# Patient Record
Sex: Male | Born: 1945 | Race: White | Hispanic: No | Marital: Married | Smoking: Never smoker
Health system: Southern US, Community
[De-identification: ages and names within clinical notes are randomized; demographics above are authoritative.]

## PROBLEM LIST (undated history)

## (undated) DIAGNOSIS — I4891 Unspecified atrial fibrillation: Secondary | ICD-10-CM

## (undated) DIAGNOSIS — E785 Hyperlipidemia, unspecified: Secondary | ICD-10-CM

## (undated) DIAGNOSIS — Z7901 Long term (current) use of anticoagulants: Secondary | ICD-10-CM

## (undated) DIAGNOSIS — T8859XA Other complications of anesthesia, initial encounter: Secondary | ICD-10-CM

## (undated) DIAGNOSIS — T4145XA Adverse effect of unspecified anesthetic, initial encounter: Secondary | ICD-10-CM

## (undated) DIAGNOSIS — I272 Pulmonary hypertension, unspecified: Secondary | ICD-10-CM

## (undated) DIAGNOSIS — I340 Nonrheumatic mitral (valve) insufficiency: Secondary | ICD-10-CM

## (undated) DIAGNOSIS — K219 Gastro-esophageal reflux disease without esophagitis: Secondary | ICD-10-CM

## (undated) DIAGNOSIS — I10 Essential (primary) hypertension: Secondary | ICD-10-CM

## (undated) DIAGNOSIS — I4821 Permanent atrial fibrillation: Secondary | ICD-10-CM

## (undated) HISTORY — DX: Essential (primary) hypertension: I10

## (undated) HISTORY — DX: Permanent atrial fibrillation: I48.21

## (undated) HISTORY — PX: CARDIOVERSION: SHX1299

## (undated) HISTORY — DX: Pulmonary hypertension, unspecified: I27.20

## (undated) HISTORY — DX: Nonrheumatic mitral (valve) insufficiency: I34.0

## (undated) HISTORY — DX: Hyperlipidemia, unspecified: E78.5

## (undated) HISTORY — PX: OTHER SURGICAL HISTORY: SHX169

## (undated) HISTORY — DX: Long term (current) use of anticoagulants: Z79.01

---

## 1998-02-10 ENCOUNTER — Emergency Department (HOSPITAL_COMMUNITY): Admission: EM | Admit: 1998-02-10 | Discharge: 1998-02-10 | Payer: Self-pay | Admitting: Emergency Medicine

## 1998-02-10 ENCOUNTER — Encounter: Payer: Self-pay | Admitting: Emergency Medicine

## 1998-02-12 ENCOUNTER — Encounter: Admission: RE | Admit: 1998-02-12 | Discharge: 1998-05-13 | Payer: Self-pay | Admitting: *Deleted

## 2011-07-23 ENCOUNTER — Encounter: Payer: Self-pay | Admitting: *Deleted

## 2011-09-23 ENCOUNTER — Ambulatory Visit (INDEPENDENT_AMBULATORY_CARE_PROVIDER_SITE_OTHER): Payer: Medicare Other | Admitting: Cardiology

## 2011-09-23 ENCOUNTER — Encounter: Payer: Self-pay | Admitting: Cardiology

## 2011-09-23 VITALS — BP 170/80 | HR 62 | Wt 223.4 lb

## 2011-09-23 DIAGNOSIS — E785 Hyperlipidemia, unspecified: Secondary | ICD-10-CM

## 2011-09-23 DIAGNOSIS — Z7901 Long term (current) use of anticoagulants: Secondary | ICD-10-CM

## 2011-09-23 DIAGNOSIS — I10 Essential (primary) hypertension: Secondary | ICD-10-CM

## 2011-09-23 DIAGNOSIS — I4891 Unspecified atrial fibrillation: Secondary | ICD-10-CM

## 2011-09-23 DIAGNOSIS — I2789 Other specified pulmonary heart diseases: Secondary | ICD-10-CM

## 2011-09-23 DIAGNOSIS — I4821 Permanent atrial fibrillation: Secondary | ICD-10-CM

## 2011-09-23 DIAGNOSIS — I272 Pulmonary hypertension, unspecified: Secondary | ICD-10-CM

## 2011-09-23 DIAGNOSIS — I059 Rheumatic mitral valve disease, unspecified: Secondary | ICD-10-CM

## 2011-09-23 DIAGNOSIS — I340 Nonrheumatic mitral (valve) insufficiency: Secondary | ICD-10-CM

## 2011-09-23 NOTE — Patient Instructions (Signed)
We will update your echocardiogram  Continue your medication  I will see you again in one year.

## 2011-09-23 NOTE — Assessment & Plan Note (Signed)
He reports that his INRs have been stable and therapeutic. Continue with his Coumadin followup.

## 2011-09-23 NOTE — Assessment & Plan Note (Signed)
Blood pressure is elevated today but he reports good control at other times. We will continue on his current therapy and monitor.

## 2011-09-23 NOTE — Progress Notes (Signed)
   [name] Date of Birth: 07/28/1945 Medical Record #[id number]  History of Present Illness: [name] is seen today for followup. He was last seen in October of 2010. He has a history of permanent atrial fibrillation that has been managed with rate control and anticoagulation. He has a history of moderate mitral insufficiency and pulmonary hypertension. His last echocardiogram was many years ago. Since his last visit he reports that he has done very well. His blood pressure is sporadically high. On his physical last week it was 130/82. 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 He has lost 13 pounds since his last visit. He denies any significant shortness of breath, chest pain, palpitations, or dizziness.  Current Outpatient Prescriptions on File Prior to Visit  Medication Sig Dispense Refill  . digoxin (LANOXIN) 0.25 MG tablet Take 250 mcg by mouth daily.      Marland Kitchen lisinopril (PRINIVIL,ZESTRIL) 40 MG tablet Take 40 mg by mouth daily.      . metFORMIN (GLUCOPHAGE) 500 MG tablet Take 500 mg by mouth daily with breakfast.      . metoprolol succinate (TOPROL-XL) 50 MG 24 hr tablet Take 50 mg by mouth daily. Take with or immediately following a meal.      . simvastatin (ZOCOR) 10 MG tablet Take 10 mg by mouth at bedtime.      Marland Kitchen warfarin (COUMADIN) 2 MG tablet Take 2 mg by mouth as directed.        No Known Allergies  Past Medical History  Diagnosis Date  . Hypertension   . Permanent atrial fibrillation     chronic atrial fib  . Chronic anticoagulation   . Pulmonary hypertension   . Diabetes mellitus     type 2  . Hyperlipidemia     Past Surgical History  Procedure Date  . Spur on heel     History  Smoking status  . Never Smoker   Smokeless tobacco  . Not on file    History  Alcohol Use No    Family History  Problem Relation Age of Onset  . Hypertension Mother   . Alzheimer's disease Mother   . Heart disease Father    cabgx2, respiratory failure, pacemaker    Review of Systems: The review of systems is positive for surgical incision and drainage of an abscess on his left shin in may..  All other systems were reviewed and are negative.  Physical Exam: BP 170/80  Pulse 62  Wt 223 lb 6.4 oz (101.334 kg) He is a pleasant white male in no acute distress. HEENT exam is unremarkable. He is normocephalic, atraumatic. Pupils are equal round and reactive. Sclera are clear. Oropharynx is clear. Neck is supple without JVD, adenopathy, thyromegaly, or bruits. Lungs are clear. Cardiac exam reveals an irregular rate and rhythm with a grade 2-3/6 holosystolic murmur at the apex radiating across the precordium. Abdomen is soft and nontender without hepatosplenomegaly or masses. He has chronic stasis dermatitis with 1+ edema. Pedal pulses are palpable. He is alert and oriented x3. Cranial nerves II through XII are intact. LABORATORY DATA: ECG demonstrates atrial fibrillation with a ventricular response of 57 beats per minute. He has an occasional PVC. Otherwise normal.  Assessment / Plan:

## 2011-09-23 NOTE — Assessment & Plan Note (Signed)
His murmur is more prominent than noted on previous exams. We will update an echocardiogram.

## 2011-09-23 NOTE — Assessment & Plan Note (Signed)
Rate is well controlled on metoprolol and digoxin. Continue rate control and anticoagulation.

## 2011-09-30 ENCOUNTER — Ambulatory Visit (HOSPITAL_COMMUNITY): Payer: Medicare Other | Attending: Cardiology | Admitting: Radiology

## 2011-09-30 DIAGNOSIS — Z7901 Long term (current) use of anticoagulants: Secondary | ICD-10-CM

## 2011-09-30 DIAGNOSIS — I4891 Unspecified atrial fibrillation: Secondary | ICD-10-CM | POA: Insufficient documentation

## 2011-09-30 DIAGNOSIS — I340 Nonrheumatic mitral (valve) insufficiency: Secondary | ICD-10-CM

## 2011-09-30 DIAGNOSIS — E785 Hyperlipidemia, unspecified: Secondary | ICD-10-CM | POA: Insufficient documentation

## 2011-09-30 DIAGNOSIS — I270 Primary pulmonary hypertension: Secondary | ICD-10-CM | POA: Insufficient documentation

## 2011-09-30 DIAGNOSIS — I252 Old myocardial infarction: Secondary | ICD-10-CM | POA: Insufficient documentation

## 2011-09-30 DIAGNOSIS — I10 Essential (primary) hypertension: Secondary | ICD-10-CM | POA: Insufficient documentation

## 2011-09-30 NOTE — Progress Notes (Signed)
Echocardiogram performed.  

## 2011-11-24 ENCOUNTER — Encounter: Payer: Self-pay | Admitting: Cardiology

## 2011-11-26 ENCOUNTER — Encounter: Payer: Self-pay | Admitting: Cardiology

## 2012-04-16 DIAGNOSIS — E7800 Pure hypercholesterolemia, unspecified: Secondary | ICD-10-CM | POA: Diagnosis not present

## 2012-04-16 DIAGNOSIS — IMO0001 Reserved for inherently not codable concepts without codable children: Secondary | ICD-10-CM | POA: Diagnosis not present

## 2013-02-06 ENCOUNTER — Ambulatory Visit: Payer: Medicare Other | Admitting: Physician Assistant

## 2013-02-14 ENCOUNTER — Encounter: Payer: Self-pay | Admitting: Physician Assistant

## 2013-02-14 ENCOUNTER — Ambulatory Visit (INDEPENDENT_AMBULATORY_CARE_PROVIDER_SITE_OTHER): Payer: Medicare Other | Admitting: Physician Assistant

## 2013-02-14 VITALS — BP 166/80 | HR 56 | Ht 75.0 in | Wt 233.0 lb

## 2013-02-14 DIAGNOSIS — E785 Hyperlipidemia, unspecified: Secondary | ICD-10-CM

## 2013-02-14 DIAGNOSIS — I340 Nonrheumatic mitral (valve) insufficiency: Secondary | ICD-10-CM

## 2013-02-14 DIAGNOSIS — I4821 Permanent atrial fibrillation: Secondary | ICD-10-CM

## 2013-02-14 DIAGNOSIS — I10 Essential (primary) hypertension: Secondary | ICD-10-CM

## 2013-02-14 DIAGNOSIS — I4891 Unspecified atrial fibrillation: Secondary | ICD-10-CM

## 2013-02-14 DIAGNOSIS — I059 Rheumatic mitral valve disease, unspecified: Secondary | ICD-10-CM

## 2013-02-14 NOTE — Progress Notes (Signed)
[address] [place], [place]  [address] Phone: [phone] Fax:  [phone]  Date:  02/14/2013   ID:  Nevaan, Bunton [birth year], MRN [id number]  PCP:  Johny Blamer, MD  Cardiologist:  Dr. Peter Swaziland     History of Present Illness: [name] is a 67 y.o. male with a hx of permanent AFib, mod MR, pulmonary HTN, HTN, HL, T2DM.  Echo (09/2011):  EF 55%, mod MR, severe LAE, severe RAE, PASP 57-61, trivial eff.  Last seen by Dr. Peter Swaziland 09/2011.    He is doing well since last seen.  The patient denies chest pain, shortness of breath, syncope, orthopnea, PND or significant pedal edema.  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    Recent Labs: No results found for requested labs within last 365 days.  Wt Readings from Last 3 Encounters:  02/14/13 233 lb (105.688 kg)  09/23/11 223 lb 6.4 oz (101.334 kg)     Past Medical History  Diagnosis Date  . Hypertension   . Permanent atrial fibrillation     chronic atrial fib  . Chronic anticoagulation   . Pulmonary hypertension   . Diabetes mellitus     type 2  . Hyperlipidemia   . Mitral regurgitation     a. Echo (09/2011):  EF 55%, mod MR, severe LAE, severe RAE, PASP 57-61, trivial eff  . Pulmonary hypertension     Current Outpatient Prescriptions  Medication Sig Dispense Refill  . digoxin (LANOXIN) 0.25 MG tablet Take 250 mcg by mouth daily.      . Glucose Blood (FREESTYLE LITE TEST VI)       . lisinopril (PRINIVIL,ZESTRIL) 40 MG tablet Take 40 mg by mouth daily.      . metFORMIN (GLUCOPHAGE) 500 MG tablet Take 500 mg by mouth daily with breakfast.      . metoprolol (LOPRESSOR) 50 MG tablet Take 50 mg by mouth 2 (two) times daily. 1/2 tab      . omeprazole (PRILOSEC) 20 MG capsule       . simvastatin (ZOCOR) 10 MG tablet Take 10 mg by mouth at bedtime.      Marland Kitchen warfarin (COUMADIN) 2 MG tablet Take 2 mg by mouth as directed.       No current facility-administered  medications for this visit.    Allergies:   Review of patient's allergies indicates no known allergies.   Social History:  The patient  reports that he has never smoked. He does not have any smokeless tobacco history on file. He reports that he does not drink alcohol or use illicit drugs.   Family History:  The patient's family history includes Alzheimer's disease in his mother; Heart disease in his father; Hypertension in his mother.   ROS:  Please see the history of present illness.      All other systems reviewed and negative.   PHYSICAL EXAM: VS:  BP 166/80  Pulse 56  Ht 6\' 3"  (1.905 m)  Wt 233 lb (105.688 kg)  BMI 29.12 kg/m2 Well nourished, well developed, in no acute distress HEENT: normal Neck: no JVD Cardiac:  normal S1, S2; irregularly irregular rhythm; no murmur Lungs:  clear to auscultation bilaterally, no wheezing, rhonchi or rales Abd: soft, nontender, no hepatomegaly Ext: no edema Skin: warm and dry Neuro:  CNs 2-12 intact, no focal abnormalities noted  EKG:  AFib, HR 56, no change from prior tracing     ASSESSMENT AND  PLAN:  1. Atrial Fibrillation:  Rate controlled.  HR is actually somewhat slow but stable over the years.  Coumadin is managed by PCP.  I will have him get a digoxin level with his MD at the [facility] this week.   2. Mitral Regurgitation:  Stable by last echo.  He is asymptomatic.  3. Hypertension:  BP elevated. He notes his BP was better at his PCPs office a few months ago.  Will ask for a BP check with MD at [facility] later this week.  If similar to today, would start Norvasc 2.5 QD.  Check BMET.  4. Hyperlipidemia:  Continue statin.  5. Disposition:  F/u with Dr. Peter Swaziland in 6 mos.   Signed, Tereso Newcomer, PA-C  02/14/2013 3:52 PM

## 2013-02-14 NOTE — Patient Instructions (Signed)
Your physician wants you to follow-up in:  6 months with Dr. Swaziland. You will receive a reminder letter in the mail two months in advance. If you don't receive a letter, please call our office to schedule the follow-up appointment.  [facility] and [facility].  Have the [place] fax results to [place].  Fax number here is [phone]

## 2013-07-31 ENCOUNTER — Ambulatory Visit
Admission: RE | Admit: 2013-07-31 | Discharge: 2013-07-31 | Disposition: A | Discharge status: Home / Self Care | Payer: Commercial Managed Care - HMO | Source: Ambulatory Visit | Attending: Family Medicine | Admitting: Family Medicine

## 2013-07-31 ENCOUNTER — Other Ambulatory Visit: Payer: Self-pay | Admitting: Family Medicine

## 2013-07-31 DIAGNOSIS — M79604 Pain in right leg: Secondary | ICD-10-CM

## 2014-04-03 DIAGNOSIS — Z7901 Long term (current) use of anticoagulants: Secondary | ICD-10-CM | POA: Diagnosis not present

## 2014-04-03 DIAGNOSIS — I4891 Unspecified atrial fibrillation: Secondary | ICD-10-CM | POA: Diagnosis not present

## 2014-04-16 DIAGNOSIS — E119 Type 2 diabetes mellitus without complications: Secondary | ICD-10-CM | POA: Diagnosis not present

## 2014-04-16 DIAGNOSIS — I10 Essential (primary) hypertension: Secondary | ICD-10-CM | POA: Diagnosis not present

## 2014-04-16 DIAGNOSIS — I4891 Unspecified atrial fibrillation: Secondary | ICD-10-CM | POA: Diagnosis not present

## 2014-05-08 DIAGNOSIS — L723 Sebaceous cyst: Secondary | ICD-10-CM | POA: Diagnosis not present

## 2014-05-08 DIAGNOSIS — E782 Mixed hyperlipidemia: Secondary | ICD-10-CM | POA: Diagnosis not present

## 2014-05-08 DIAGNOSIS — M109 Gout, unspecified: Secondary | ICD-10-CM | POA: Diagnosis not present

## 2014-05-08 DIAGNOSIS — I10 Essential (primary) hypertension: Secondary | ICD-10-CM | POA: Diagnosis not present

## 2014-05-08 DIAGNOSIS — E119 Type 2 diabetes mellitus without complications: Secondary | ICD-10-CM | POA: Diagnosis not present

## 2014-05-08 DIAGNOSIS — I4891 Unspecified atrial fibrillation: Secondary | ICD-10-CM | POA: Diagnosis not present

## 2014-05-08 DIAGNOSIS — I340 Nonrheumatic mitral (valve) insufficiency: Secondary | ICD-10-CM | POA: Diagnosis not present

## 2014-05-18 DIAGNOSIS — Z7901 Long term (current) use of anticoagulants: Secondary | ICD-10-CM | POA: Diagnosis not present

## 2014-05-18 DIAGNOSIS — I4891 Unspecified atrial fibrillation: Secondary | ICD-10-CM | POA: Diagnosis not present

## 2014-05-25 DIAGNOSIS — Z7901 Long term (current) use of anticoagulants: Secondary | ICD-10-CM | POA: Diagnosis not present

## 2014-05-25 DIAGNOSIS — I4891 Unspecified atrial fibrillation: Secondary | ICD-10-CM | POA: Diagnosis not present

## 2014-06-22 DIAGNOSIS — Z7901 Long term (current) use of anticoagulants: Secondary | ICD-10-CM | POA: Diagnosis not present

## 2014-06-22 DIAGNOSIS — I4891 Unspecified atrial fibrillation: Secondary | ICD-10-CM | POA: Diagnosis not present

## 2014-07-02 DIAGNOSIS — I4891 Unspecified atrial fibrillation: Secondary | ICD-10-CM | POA: Diagnosis not present

## 2014-07-02 DIAGNOSIS — Z7901 Long term (current) use of anticoagulants: Secondary | ICD-10-CM | POA: Diagnosis not present

## 2014-07-18 DIAGNOSIS — Z7901 Long term (current) use of anticoagulants: Secondary | ICD-10-CM | POA: Diagnosis not present

## 2014-07-18 DIAGNOSIS — I4891 Unspecified atrial fibrillation: Secondary | ICD-10-CM | POA: Diagnosis not present

## 2014-08-20 DIAGNOSIS — I4891 Unspecified atrial fibrillation: Secondary | ICD-10-CM | POA: Diagnosis not present

## 2014-08-20 DIAGNOSIS — Z7901 Long term (current) use of anticoagulants: Secondary | ICD-10-CM | POA: Diagnosis not present

## 2014-09-20 DIAGNOSIS — I10 Essential (primary) hypertension: Secondary | ICD-10-CM | POA: Diagnosis not present

## 2014-09-20 DIAGNOSIS — L820 Inflamed seborrheic keratosis: Secondary | ICD-10-CM | POA: Diagnosis not present

## 2014-09-20 DIAGNOSIS — E782 Mixed hyperlipidemia: Secondary | ICD-10-CM | POA: Diagnosis not present

## 2014-09-20 DIAGNOSIS — E119 Type 2 diabetes mellitus without complications: Secondary | ICD-10-CM | POA: Diagnosis not present

## 2014-09-20 DIAGNOSIS — Z7901 Long term (current) use of anticoagulants: Secondary | ICD-10-CM | POA: Diagnosis not present

## 2014-09-20 DIAGNOSIS — R1013 Epigastric pain: Secondary | ICD-10-CM | POA: Diagnosis not present

## 2014-09-20 DIAGNOSIS — L821 Other seborrheic keratosis: Secondary | ICD-10-CM | POA: Diagnosis not present

## 2014-09-21 ENCOUNTER — Other Ambulatory Visit: Payer: Self-pay | Admitting: Family Medicine

## 2014-09-21 DIAGNOSIS — R1013 Epigastric pain: Secondary | ICD-10-CM

## 2014-10-25 ENCOUNTER — Ambulatory Visit
Admission: RE | Admit: 2014-10-25 | Discharge: 2014-10-25 | Disposition: A | Discharge status: Home / Self Care | Payer: Medicare Other | Source: Ambulatory Visit | Attending: Family Medicine | Admitting: Family Medicine

## 2014-10-25 DIAGNOSIS — K8020 Calculus of gallbladder without cholecystitis without obstruction: Secondary | ICD-10-CM | POA: Diagnosis not present

## 2014-10-25 DIAGNOSIS — R1013 Epigastric pain: Secondary | ICD-10-CM | POA: Diagnosis not present

## 2014-10-31 DIAGNOSIS — K219 Gastro-esophageal reflux disease without esophagitis: Secondary | ICD-10-CM | POA: Diagnosis not present

## 2014-10-31 DIAGNOSIS — M109 Gout, unspecified: Secondary | ICD-10-CM | POA: Diagnosis not present

## 2014-10-31 DIAGNOSIS — K8020 Calculus of gallbladder without cholecystitis without obstruction: Secondary | ICD-10-CM | POA: Diagnosis not present

## 2014-10-31 DIAGNOSIS — I10 Essential (primary) hypertension: Secondary | ICD-10-CM | POA: Diagnosis not present

## 2014-10-31 DIAGNOSIS — E782 Mixed hyperlipidemia: Secondary | ICD-10-CM | POA: Diagnosis not present

## 2014-10-31 DIAGNOSIS — E119 Type 2 diabetes mellitus without complications: Secondary | ICD-10-CM | POA: Diagnosis not present

## 2014-10-31 DIAGNOSIS — I4891 Unspecified atrial fibrillation: Secondary | ICD-10-CM | POA: Diagnosis not present

## 2014-11-08 DIAGNOSIS — I4891 Unspecified atrial fibrillation: Secondary | ICD-10-CM | POA: Diagnosis not present

## 2014-11-08 DIAGNOSIS — Z7901 Long term (current) use of anticoagulants: Secondary | ICD-10-CM | POA: Diagnosis not present

## 2014-11-16 ENCOUNTER — Ambulatory Visit: Payer: Self-pay | Admitting: General Surgery

## 2014-11-16 DIAGNOSIS — K8020 Calculus of gallbladder without cholecystitis without obstruction: Secondary | ICD-10-CM | POA: Diagnosis not present

## 2014-11-16 NOTE — H&P (Signed)
History of Present Illness Ralene Ok MD; 11/16/2014 3:08 PM) Patient words: gallbladder.  The patient is a 69 year old male who presents for evaluation of gall stones. The patient is a 69 year old male who is referred by Dr. Shirline Frees for evaluation of symptomatic cholelithiasis. Patient states that he is had epigastric/right upper quadrant pain over the last several months. He states it's been more severe with higher fatty foods. Patient underwent an ultrasound which revealed multiple gallstones. Patient has a history of A. fib and is on Coumadin. Dr. Kenton Kingfisher manages his Coumadin.   Other Problems Marjean Donna, CMA; 11/16/2014 2:39 PM) Atrial Fibrillation Back Pain Cholelithiasis Diabetes Mellitus High blood pressure Sleep Apnea  Past Surgical History Marjean Donna, CMA; 11/16/2014 2:39 PM) Foot Surgery Right. Oral Surgery  Diagnostic Studies History ([place]; 11/16/2014 2:39 PM) Colonoscopy 1-5 years ago  Allergies Davy Pique Bynum, CMA; 11/16/2014 2:41 PM) No Known Drug Allergies09/04/2014  Medication History Davy Pique Bynum, CMA; 11/16/2014 2:41 PM) Digoxin (250MCG Tablet, Oral) Active. Hydrochlorothiazide (25MG  Tablet, Oral) Active. Lisinopril (40MG  Tablet, Oral) Active. MetFORMIN HCl (1000MG  Tablet, Oral) Active. Warfarin Sodium (3MG  Tablet, Oral) Active. Simvastatin (20MG  Tablet, Oral) Active. Omeprazole (20MG  Capsule DR, Oral) Active. Metoprolol Tartrate (50MG  Tablet, Oral) Active. Colchicine (0.6MG  Tablet, Oral) Active. Medications Reconciled  Social History Marjean Donna, CMA; 11/16/2014 2:39 PM) Caffeine use Coffee, Tea. No alcohol use No drug use Tobacco use Never smoker.  Family History Marjean Donna, [place]; 11/16/2014 2:39 PM) Diabetes Mellitus Father. Hypertension Mother.  Review of Systems Ralene Ok MD; 11/16/2014 3:07 PM) General Present- Feeling well. Not Present- Fever. Skin Present- Dryness and Non-Healing Wounds. Not  Present- Change in Wart/Mole, Hives, Jaundice, New Lesions, Rash and Ulcer. HEENT Present- Seasonal Allergies and Wears glasses/contact lenses. Not Present- Earache, Hearing Loss, Hoarseness, Nose Bleed, Oral Ulcers, Ringing in the Ears, Sinus Pain, Sore Throat, Visual Disturbances and Yellow Eyes. Respiratory Not Present- Bloody sputum, Chronic Cough, Difficulty Breathing, Snoring and Wheezing. Cardiovascular Not Present- Chest Pain. Gastrointestinal Present- Abdominal Pain and Indigestion. Not Present- Bloating, Bloody Stool, Change in Bowel Habits, Chronic diarrhea, Constipation, Difficulty Swallowing, Excessive gas, Gets full quickly at meals, Hemorrhoids, Nausea, Rectal Pain and Vomiting. Male Genitourinary Present- Change in Urinary Stream and Impotence. Not Present- Blood in Urine, Frequency, Nocturia, Painful Urination, Urgency and Urine Leakage. Musculoskeletal Present- Back Pain, Joint Pain, Joint Stiffness and Muscle Pain. Not Present- Muscle Weakness and Swelling of Extremities. Neurological Not Present- Decreased Memory, Fainting, Headaches, Numbness, Seizures, Tingling, Tremor, Trouble walking and Weakness. Psychiatric Not Present- Anxiety, Bipolar, Change in Sleep Pattern, Depression, Fearful and Frequent crying. Endocrine Present- Cold Intolerance. Not Present- Excessive Hunger, Hair Changes, Heat Intolerance, Hot flashes and New Diabetes.   Vitals (Sonya Bynum CMA; 11/16/2014 2:40 PM) 11/16/2014 2:40 PM Weight: 220 lb Height: 75in Body Surface Area: 2.3 m Body Mass Index: 27.5 kg/m Temp.: 47F(Temporal)  Pulse: 110 (Regular)  BP: 128/78 (Sitting, Left Arm, Standard)    Physical Exam Ralene Ok MD; 11/16/2014 3:07 PM) General Mental Status-Alert. General Appearance-Consistent with stated age. Hydration-Well hydrated. Voice-Normal.  Head and Neck Head-normocephalic, atraumatic with no lesions or palpable masses.  Eye Eyeball -  Bilateral-Extraocular movements intact. Sclera/Conjunctiva - Bilateral-No scleral icterus.  Chest and Lung Exam Chest and lung exam reveals -quiet, even and easy respiratory effort with no use of accessory muscles. Inspection Chest Wall - Normal. Back - normal.  Cardiovascular Cardiovascular examination reveals -normal heart sounds, regular rate and rhythm with no murmurs.  Abdomen Inspection Normal Exam - No Hernias.  Palpation/Percussion Normal exam - Soft, Non Tender, No Rebound tenderness, No Rigidity (guarding) and No hepatosplenomegaly. Auscultation Normal exam - Bowel sounds normal.  Neurologic Neurologic evaluation reveals -alert and oriented x 3 with no impairment of recent or remote memory. Mental Status-Normal.  Musculoskeletal Normal Exam - Left-Upper Extremity Strength Normal and Lower Extremity Strength Normal. Normal Exam - Right-Upper Extremity Strength Normal, Lower Extremity Weakness.    Assessment & Plan Ralene Ok MD; 11/16/2014 3:09 PM) SYMPTOMATIC CHOLELITHIASIS (574.20  K80.20) Impression: 69 year old male sent by cholelithiasis  1. The patient would like to proceed to the operating for a laparoscopic cholecystectomy. 2. Risks and benefits were discussed with the patient to generally include, but not limited to: infection, bleeding, possible need for post op ERCP, damage to the bile ducts, bile leak, and possible need for further surgery. Alternatives were offered and described. All questions were answered and the patient voiced understanding of the procedure and wishes to proceed at this point with a laparoscopic cholecystectomy

## 2014-11-22 ENCOUNTER — Ambulatory Visit: Payer: Commercial Managed Care - HMO | Admitting: Cardiology

## 2014-11-22 DIAGNOSIS — Z7901 Long term (current) use of anticoagulants: Secondary | ICD-10-CM | POA: Diagnosis not present

## 2014-11-22 DIAGNOSIS — I4891 Unspecified atrial fibrillation: Secondary | ICD-10-CM | POA: Diagnosis not present

## 2014-12-12 DIAGNOSIS — I4891 Unspecified atrial fibrillation: Secondary | ICD-10-CM | POA: Diagnosis not present

## 2014-12-12 DIAGNOSIS — Z7901 Long term (current) use of anticoagulants: Secondary | ICD-10-CM | POA: Diagnosis not present

## 2014-12-14 ENCOUNTER — Ambulatory Visit (INDEPENDENT_AMBULATORY_CARE_PROVIDER_SITE_OTHER): Payer: Medicare Other | Admitting: Cardiology

## 2014-12-14 ENCOUNTER — Encounter: Payer: Self-pay | Admitting: Cardiology

## 2014-12-14 VITALS — BP 150/62 | HR 47 | Ht 75.0 in | Wt 220.2 lb

## 2014-12-14 DIAGNOSIS — I10 Essential (primary) hypertension: Secondary | ICD-10-CM

## 2014-12-14 DIAGNOSIS — I340 Nonrheumatic mitral (valve) insufficiency: Secondary | ICD-10-CM | POA: Diagnosis not present

## 2014-12-14 DIAGNOSIS — I272 Pulmonary hypertension, unspecified: Secondary | ICD-10-CM

## 2014-12-14 DIAGNOSIS — I482 Chronic atrial fibrillation: Secondary | ICD-10-CM

## 2014-12-14 DIAGNOSIS — I4821 Permanent atrial fibrillation: Secondary | ICD-10-CM

## 2014-12-14 DIAGNOSIS — Z7901 Long term (current) use of anticoagulants: Secondary | ICD-10-CM | POA: Diagnosis not present

## 2014-12-14 DIAGNOSIS — E785 Hyperlipidemia, unspecified: Secondary | ICD-10-CM | POA: Diagnosis not present

## 2014-12-14 DIAGNOSIS — I270 Primary pulmonary hypertension: Secondary | ICD-10-CM

## 2014-12-14 NOTE — Patient Instructions (Signed)
Stop taking digoxin  Continue your other therapy  We will update your Echocardiogram  I will see you in one year

## 2014-12-14 NOTE — Progress Notes (Signed)
[place] [place], [place]  [address] Phone: [phone] Fax:  [phone]  Date:  12/14/2014   ID:  Drayk, Humbarger [birth year], MRN [id number]  PCP:  Shirline Frees, MD  Cardiologist:  Dr. Peter Martinique     History of Present Illness: [name] is a 69 y.o. male with a hx of permanent AFib, mod MR, pulmonary HTN, HTN, HL, T2DM.  Echo (09/2011):  EF 55%, mod MR, severe LAE, severe RAE, PASP 57-61, trivial eff.   On follow up today he is doing well.  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  He does note some fatigue. No dizziness or syncope. No SOB or chest pain. He has been diagnosed with gallstones and is planning on having surgery.  Recent Labs: No results found for requested labs within last 365 days.  Wt Readings from Last 3 Encounters:  12/14/14 99.882 kg (220 lb 3.2 oz)  02/14/13 105.688 kg (233 lb)  09/23/11 101.334 kg (223 lb 6.4 oz)     Past Medical History  Diagnosis Date  . Hypertension   . Permanent atrial fibrillation     chronic atrial fib  . Chronic anticoagulation   . Pulmonary hypertension   . Diabetes mellitus     type 2  . Hyperlipidemia   . Mitral regurgitation     a. Echo (09/2011):  EF 55%, mod MR, severe LAE, severe RAE, PASP 57-61, trivial eff  . Pulmonary hypertension     Current Outpatient Prescriptions  Medication Sig Dispense Refill  . colchicine 0.6 MG tablet Take 0.6 mg by mouth as needed.    . Glucose Blood (FREESTYLE LITE TEST VI)     . hydrochlorothiazide (HYDRODIURIL) 25 MG tablet Take 25 mg by mouth daily.    Marland Kitchen lisinopril (PRINIVIL,ZESTRIL) 40 MG tablet Take 40 mg by mouth daily.    . metFORMIN (GLUCOPHAGE) 500 MG tablet Take 500 mg by mouth daily with breakfast.    . metoprolol (LOPRESSOR) 50 MG tablet Take 50 mg by mouth 2 (two) times daily. 1/2 tab    . omeprazole (PRILOSEC) 20 MG capsule     . simvastatin (ZOCOR) 10 MG tablet Take 10 mg by mouth at bedtime.    Marland Kitchen  warfarin (COUMADIN) 2 MG tablet Take 2 mg by mouth as directed.     No current facility-administered medications for this visit.    Allergies:   Review of patient's allergies indicates no known allergies.   Social History:  The patient  reports that he has never smoked. He does not have any smokeless tobacco history on file. He reports that he does not drink alcohol or use illicit drugs.   Family History:  The patient's family history includes Alzheimer's disease in his mother; Heart disease in his father; Hypertension in his mother.   ROS:  Please see the history of present illness.      All other systems reviewed and negative.   PHYSICAL EXAM: VS:  BP 150/62 mmHg  Pulse 47  Ht 6\' 3"  (1.905 m)  Wt 99.882 kg (220 lb 3.2 oz)  BMI 27.52 kg/m2 Well nourished, well developed, in no acute distress HEENT: normal Neck: no JVD Cardiac:  normal S1, S2; irregularly irregular rhythm; no murmur Lungs:  clear to auscultation bilaterally, no wheezing, rhonchi or rales Abd: soft, nontender, no hepatomegaly Ext: no edema Skin: warm and dry Neuro:  CNs 2-12 intact, no focal abnormalities noted  EKG:  AFib,  HR 47. No acute ST changes. I have personally reviewed and interpreted this study.   ASSESSMENT AND PLAN:  1. Atrial Fibrillation:  Rate is too slow.  This may be contributing to his fatigue. Recommend stopping digoxin. Continue metoprolol.   Coumadin is managed by PCP. We discussed NOAC therapy but he has done well on coumadin. I see no reason to change.   2. Mitral Regurgitation: will update Echo.  He is asymptomatic.  3. Hypertension:  BP under fair control  4. Hyperlipidemia:  Continue statin.  5. Disposition:  F/u with me in one year 6. Gallstones. If Echo stable he will be cleared from a cardiac standpoint. Will hold coumadin 5 days before surgery. Does not need bridging.   Signed, Peter Martinique MD, [facility]    12/14/2014 5:22 PM

## 2014-12-24 ENCOUNTER — Other Ambulatory Visit: Payer: Self-pay

## 2014-12-24 ENCOUNTER — Ambulatory Visit (HOSPITAL_COMMUNITY): Payer: Medicare Other | Attending: Internal Medicine

## 2014-12-24 DIAGNOSIS — I4821 Permanent atrial fibrillation: Secondary | ICD-10-CM

## 2014-12-24 DIAGNOSIS — I340 Nonrheumatic mitral (valve) insufficiency: Secondary | ICD-10-CM | POA: Insufficient documentation

## 2014-12-24 DIAGNOSIS — I272 Other secondary pulmonary hypertension: Secondary | ICD-10-CM

## 2014-12-24 DIAGNOSIS — E119 Type 2 diabetes mellitus without complications: Secondary | ICD-10-CM | POA: Insufficient documentation

## 2014-12-24 DIAGNOSIS — I10 Essential (primary) hypertension: Secondary | ICD-10-CM | POA: Diagnosis not present

## 2014-12-24 DIAGNOSIS — Z8249 Family history of ischemic heart disease and other diseases of the circulatory system: Secondary | ICD-10-CM | POA: Insufficient documentation

## 2014-12-24 DIAGNOSIS — E785 Hyperlipidemia, unspecified: Secondary | ICD-10-CM | POA: Insufficient documentation

## 2014-12-24 DIAGNOSIS — Z7901 Long term (current) use of anticoagulants: Secondary | ICD-10-CM

## 2014-12-24 DIAGNOSIS — I482 Chronic atrial fibrillation: Secondary | ICD-10-CM

## 2014-12-24 DIAGNOSIS — I517 Cardiomegaly: Secondary | ICD-10-CM | POA: Insufficient documentation

## 2015-01-01 ENCOUNTER — Telehealth: Payer: Self-pay

## 2015-01-01 NOTE — Telephone Encounter (Signed)
Spoke to patient Dr.Jordan advised ok for upcoming gallbladder surgery.Advised to hold coumadin 5 days prior to surgery.Note faxed to [facility] Surgery Dr Rosendo Gros fax # [phone].Marland Kitchen

## 2015-01-17 DIAGNOSIS — Z7901 Long term (current) use of anticoagulants: Secondary | ICD-10-CM | POA: Diagnosis not present

## 2015-01-17 DIAGNOSIS — I4891 Unspecified atrial fibrillation: Secondary | ICD-10-CM | POA: Diagnosis not present

## 2015-01-17 DIAGNOSIS — Z23 Encounter for immunization: Secondary | ICD-10-CM | POA: Diagnosis not present

## 2015-01-30 DIAGNOSIS — Z7901 Long term (current) use of anticoagulants: Secondary | ICD-10-CM | POA: Diagnosis not present

## 2015-01-30 DIAGNOSIS — I4891 Unspecified atrial fibrillation: Secondary | ICD-10-CM | POA: Diagnosis not present

## 2015-02-20 ENCOUNTER — Ambulatory Visit: Payer: Self-pay | Admitting: General Surgery

## 2015-02-21 NOTE — Patient Instructions (Signed)
RANDOLL BIR  02/21/2015   Your procedure is scheduled on: 03/01/15    Report to [facility] Main  Entrance take [place]  elevators to 3rd floor to  [place] at    [place] AM.  Call this number if you have problems the morning of surgery [phone]   Remember: ONLY 1 PERSON MAY GO WITH YOU TO SHORT STAY TO GET  READY MORNING OF [place].  Do not eat food or drink liquids :After Midnight.             Eat a good healthy snack prior to bedtime.       Take these medicines the morning of surgery with A SIP OF WATER:   Metoprolol ( Lopressor), Omeprazole ( Prilosec) DO NOT TAKE ANY DIABETIC MEDICATIONS DAY OF YOUR SURGERY                               You may not have any metal on your body including hair pins and              piercings  Do not wear jewelry,  lotions, powders or perfumes, deodorant                         Men may shave face and neck.   Do not bring valuables to the hospital. [place].  Contacts, dentures or bridgework may not be worn into surgery.      Patients discharged the day of surgery will not be allowed to drive home.  Name and phone number of your driver:  Special Instructions:  Coughing and deep breathing exercises, leg exercises               Please read over the following fact sheets you were given: _____________________________________________________________________             [facility] - Preparing for Surgery Before surgery, you can play an important role.  Because skin is not sterile, your skin needs to be as free of germs as possible.  You can reduce the number of germs on your skin by washing with CHG (chlorahexidine gluconate) soap before surgery.  CHG is an antiseptic cleaner which kills germs and bonds with the skin to continue killing germs even after washing. Please DO NOT use if you have an allergy to CHG or antibacterial soaps.  If your skin becomes  reddened/irritated stop using the CHG and inform your nurse when you arrive at Short Stay. Do not shave (including legs and underarms) for at least 48 hours prior to the first CHG shower.  You may shave your face/neck. Please follow these instructions carefully:  1.  Shower with CHG Soap the night before surgery and the  morning of Surgery.  2.  If you choose to wash your hair, wash your hair first as usual with your  normal  shampoo.  3.  After you shampoo, rinse your hair and body thoroughly to remove the  shampoo.                           4.  Use CHG as you would any other liquid soap.  You  can apply chg directly  to the skin and wash                       Gently with a scrungie or clean washcloth.  5.  Apply the CHG Soap to your body ONLY FROM THE NECK DOWN.   Do not use on face/ open                           Wound or open sores. Avoid contact with eyes, ears mouth and genitals (private parts).                       Wash face,  Genitals (private parts) with your normal soap.             6.  Wash thoroughly, paying special attention to the area where your surgery  will be performed.  7.  Thoroughly rinse your body with warm water from the neck down.  8.  DO NOT shower/wash with your normal soap after using and rinsing off  the CHG Soap.                9.  Pat yourself dry with a clean towel.            10.  Wear clean pajamas.            11.  Place clean sheets on your bed the night of your first shower and do not  sleep with pets. Day of Surgery : Do not apply any lotions/deodorants the morning of surgery.  Please wear clean clothes to the hospital/surgery center.  FAILURE TO FOLLOW THESE INSTRUCTIONS MAY RESULT IN THE CANCELLATION OF YOUR SURGERY PATIENT SIGNATURE_________________________________  NURSE SIGNATURE__________________________________  ________________________________________________________________________

## 2015-02-22 ENCOUNTER — Encounter (HOSPITAL_COMMUNITY): Payer: Self-pay

## 2015-02-22 ENCOUNTER — Encounter (HOSPITAL_COMMUNITY)
Admission: RE | Admit: 2015-02-22 | Discharge: 2015-02-22 | Disposition: A | Discharge status: Home / Self Care | Payer: Medicare Other | Source: Ambulatory Visit | Attending: General Surgery | Admitting: General Surgery

## 2015-02-22 DIAGNOSIS — Z01812 Encounter for preprocedural laboratory examination: Secondary | ICD-10-CM | POA: Insufficient documentation

## 2015-02-22 DIAGNOSIS — K8080 Other cholelithiasis without obstruction: Secondary | ICD-10-CM | POA: Diagnosis not present

## 2015-02-22 HISTORY — DX: Other complications of anesthesia, initial encounter: T88.59XA

## 2015-02-22 HISTORY — DX: Gastro-esophageal reflux disease without esophagitis: K21.9

## 2015-02-22 HISTORY — DX: Adverse effect of unspecified anesthetic, initial encounter: T41.45XA

## 2015-02-22 LAB — CBC
HCT: 40.8 % (ref 39.0–52.0)
Hemoglobin: 13.7 g/dL (ref 13.0–17.0)
MCH: 32.5 pg (ref 26.0–34.0)
MCHC: 33.6 g/dL (ref 30.0–36.0)
MCV: 96.7 fL (ref 78.0–100.0)
PLATELETS: 166 10*3/uL (ref 150–400)
RBC: 4.22 MIL/uL (ref 4.22–5.81)
RDW: 12.9 % (ref 11.5–15.5)
WBC: 11.4 10*3/uL — ABNORMAL HIGH (ref 4.0–10.5)

## 2015-02-22 LAB — PROTIME-INR
INR: 3.17 — ABNORMAL HIGH (ref 0.00–1.49)
PROTHROMBIN TIME: 31.9 s — AB (ref 11.6–15.2)

## 2015-02-22 LAB — BASIC METABOLIC PANEL
Anion gap: 8 (ref 5–15)
BUN: 33 mg/dL — ABNORMAL HIGH (ref 6–20)
CALCIUM: 9.1 mg/dL (ref 8.9–10.3)
CO2: 26 mmol/L (ref 22–32)
CREATININE: 1.72 mg/dL — AB (ref 0.61–1.24)
Chloride: 107 mmol/L (ref 101–111)
GFR calc non Af Amer: 39 mL/min — ABNORMAL LOW (ref >60)
GFR, EST AFRICAN AMERICAN: 45 mL/min — AB (ref >60)
Glucose, Bld: 140 mg/dL — ABNORMAL HIGH (ref 65–99)
Potassium: 4.5 mmol/L (ref 3.5–5.1)
SODIUM: 141 mmol/L (ref 135–145)

## 2015-02-22 NOTE — Progress Notes (Signed)
PT/INR results of 02/22/2015 faxed via EPIC to Dr Rosendo Gros.

## 2015-02-22 NOTE — Progress Notes (Signed)
BMP done 02/22/15 routed via EPIC to Dr Rosendo Gros.

## 2015-02-22 NOTE — Progress Notes (Addendum)
EKG-12/14/14-EPIC  ECHO-12/24/14-EPIC  LOV with Dr P Martinique- EPIC - 12/14/14

## 2015-02-23 LAB — HEMOGLOBIN A1C
HEMOGLOBIN A1C: 6.9 % — AB (ref 4.8–5.6)
MEAN PLASMA GLUCOSE: 151 mg/dL

## 2015-02-25 NOTE — Progress Notes (Signed)
HgA1C done 02/22/2015 faxed via EPIC to DR Rosendo Gros.

## 2015-02-26 NOTE — Anesthesia Preprocedure Evaluation (Addendum)
Anesthesia Evaluation  Patient identified by MRN, date of birth, ID band Patient awake    Reviewed: Allergy & Precautions, NPO status , Patient's Chart, lab work & pertinent test results  Airway Mallampati: II   Neck ROM: Full    Dental  (+) Dental Advisory Given, Teeth Intact   Pulmonary neg pulmonary ROS,    breath sounds clear to auscultation       Cardiovascular Exercise Tolerance: Good hypertension, Pt. on medications + dysrhythmias Atrial Fibrillation + Valvular Problems/Murmurs MR  Rhythm:Regular  AF, ECHO 12/2014 EF 65%, moderate MR, Left and Right atrial enlargement, HX pulmonary HTN   Neuro/Psych    GI/Hepatic GERD  Medicated,  Endo/Other  diabetes, Poorly Controlled, Type 2, Oral Hypoglycemic Agents  Renal/GU Renal InsufficiencyRenal diseaseCreat 1.7     Musculoskeletal   Abdominal (+)  Abdomen: soft.    Peds  Hematology 13/40   Anesthesia Other Findings On coumadin for AF, coumadin has been held  Reproductive/Obstetrics                            Anesthesia Physical Anesthesia Plan  ASA: III  Anesthesia Plan: General   Post-op Pain Management:    Induction: Intravenous  Airway Management Planned: Oral ETT  Additional Equipment:   Intra-op Plan:   Post-operative Plan: Extubation in OR  Informed Consent: I have reviewed the patients History and Physical, chart, labs and discussed the procedure including the risks, benefits and alternatives for the proposed anesthesia with the patient or authorized representative who has indicated his/her understanding and acceptance.     Plan Discussed with:   Anesthesia Plan Comments: (Check am labs, On coumadin for AF)        Anesthesia Quick Evaluation

## 2015-02-27 DIAGNOSIS — I4891 Unspecified atrial fibrillation: Secondary | ICD-10-CM | POA: Diagnosis not present

## 2015-02-27 DIAGNOSIS — Z7901 Long term (current) use of anticoagulants: Secondary | ICD-10-CM | POA: Diagnosis not present

## 2015-03-01 ENCOUNTER — Encounter (HOSPITAL_COMMUNITY): Payer: Self-pay

## 2015-03-01 ENCOUNTER — Ambulatory Visit (HOSPITAL_COMMUNITY): Payer: Medicare Other | Admitting: Anesthesiology

## 2015-03-01 ENCOUNTER — Encounter (HOSPITAL_COMMUNITY)
Admission: RE | Disposition: A | Discharge status: Home / Self Care | Payer: Self-pay | Source: Ambulatory Visit | Attending: General Surgery

## 2015-03-01 ENCOUNTER — Ambulatory Visit (HOSPITAL_COMMUNITY)
Admission: RE | Admit: 2015-03-01 | Discharge: 2015-03-01 | Disposition: A | Discharge status: Home / Self Care | Payer: Medicare Other | Source: Ambulatory Visit | Attending: General Surgery | Admitting: General Surgery

## 2015-03-01 DIAGNOSIS — Z79899 Other long term (current) drug therapy: Secondary | ICD-10-CM | POA: Diagnosis not present

## 2015-03-01 DIAGNOSIS — E119 Type 2 diabetes mellitus without complications: Secondary | ICD-10-CM | POA: Diagnosis not present

## 2015-03-01 DIAGNOSIS — I10 Essential (primary) hypertension: Secondary | ICD-10-CM | POA: Insufficient documentation

## 2015-03-01 DIAGNOSIS — K219 Gastro-esophageal reflux disease without esophagitis: Secondary | ICD-10-CM | POA: Diagnosis not present

## 2015-03-01 DIAGNOSIS — I272 Other secondary pulmonary hypertension: Secondary | ICD-10-CM | POA: Insufficient documentation

## 2015-03-01 DIAGNOSIS — Z7901 Long term (current) use of anticoagulants: Secondary | ICD-10-CM | POA: Diagnosis not present

## 2015-03-01 DIAGNOSIS — I4891 Unspecified atrial fibrillation: Secondary | ICD-10-CM | POA: Diagnosis not present

## 2015-03-01 DIAGNOSIS — Z7984 Long term (current) use of oral hypoglycemic drugs: Secondary | ICD-10-CM | POA: Insufficient documentation

## 2015-03-01 DIAGNOSIS — E1165 Type 2 diabetes mellitus with hyperglycemia: Secondary | ICD-10-CM | POA: Diagnosis not present

## 2015-03-01 DIAGNOSIS — K8010 Calculus of gallbladder with chronic cholecystitis without obstruction: Secondary | ICD-10-CM | POA: Diagnosis not present

## 2015-03-01 DIAGNOSIS — I129 Hypertensive chronic kidney disease with stage 1 through stage 4 chronic kidney disease, or unspecified chronic kidney disease: Secondary | ICD-10-CM | POA: Diagnosis not present

## 2015-03-01 DIAGNOSIS — K8020 Calculus of gallbladder without cholecystitis without obstruction: Secondary | ICD-10-CM | POA: Diagnosis not present

## 2015-03-01 DIAGNOSIS — Z419 Encounter for procedure for purposes other than remedying health state, unspecified: Secondary | ICD-10-CM

## 2015-03-01 DIAGNOSIS — N189 Chronic kidney disease, unspecified: Secondary | ICD-10-CM | POA: Diagnosis not present

## 2015-03-01 HISTORY — PX: CHOLECYSTECTOMY: SHX55

## 2015-03-01 LAB — GLUCOSE, CAPILLARY
GLUCOSE-CAPILLARY: 131 mg/dL — AB (ref 65–99)
GLUCOSE-CAPILLARY: 129 mg/dL — AB (ref 65–99)

## 2015-03-01 LAB — PROTIME-INR
INR: 1.23 (ref 0.00–1.49)
PROTHROMBIN TIME: 15.7 s — AB (ref 11.6–15.2)

## 2015-03-01 SURGERY — LAPAROSCOPIC CHOLECYSTECTOMY
Anesthesia: General

## 2015-03-01 MED ORDER — OXYCODONE-ACETAMINOPHEN 5-325 MG PO TABS: 1.0000 | ORAL_TABLET | ORAL | Status: DC | PRN

## 2015-03-01 MED ORDER — ACETAMINOPHEN 650 MG RE SUPP
650.0000 mg | RECTAL | Status: DC | PRN
  Filled 2015-03-01: qty 1

## 2015-03-01 MED ORDER — OXYCODONE HCL 5 MG PO TABS
5.0000 mg | ORAL_TABLET | ORAL | Status: DC | PRN
  Administered 2015-03-01: 5 mg via ORAL
  Filled 2015-03-01: qty 1

## 2015-03-01 MED ORDER — MEPERIDINE HCL 50 MG/ML IJ SOLN: 6.2500 mg | INTRAMUSCULAR | Status: DC | PRN

## 2015-03-01 MED ORDER — LACTATED RINGERS IR SOLN
Status: DC | PRN
  Administered 2015-03-01: 1000 mL

## 2015-03-01 MED ORDER — FENTANYL CITRATE (PF) 100 MCG/2ML IJ SOLN
INTRAMUSCULAR | Status: DC
Start: 2015-03-01 — End: 2015-03-01
  Filled 2015-03-01: qty 2

## 2015-03-01 MED ORDER — ONDANSETRON HCL 4 MG/2ML IJ SOLN
INTRAMUSCULAR | Status: DC | PRN
  Administered 2015-03-01: 4 mg via INTRAVENOUS

## 2015-03-01 MED ORDER — LACTATED RINGERS IV SOLN
INTRAVENOUS | Status: DC | PRN
  Administered 2015-03-01: 07:00:00 via INTRAVENOUS

## 2015-03-01 MED ORDER — FENTANYL CITRATE (PF) 250 MCG/5ML IJ SOLN
INTRAMUSCULAR | Status: AC
  Filled 2015-03-01: qty 5

## 2015-03-01 MED ORDER — ONDANSETRON HCL 4 MG/2ML IJ SOLN
INTRAMUSCULAR | Status: AC
  Filled 2015-03-01: qty 2

## 2015-03-01 MED ORDER — ACETAMINOPHEN 325 MG PO TABS: 650.0000 mg | ORAL_TABLET | ORAL | Status: DC | PRN

## 2015-03-01 MED ORDER — FENTANYL CITRATE (PF) 100 MCG/2ML IJ SOLN
INTRAMUSCULAR | Status: DC | PRN
  Administered 2015-03-01: 50 ug via INTRAVENOUS
  Administered 2015-03-01: 100 ug via INTRAVENOUS
  Administered 2015-03-01: 50 ug via INTRAVENOUS

## 2015-03-01 MED ORDER — ROCURONIUM BROMIDE 100 MG/10ML IV SOLN
INTRAVENOUS | Status: AC
  Filled 2015-03-01: qty 1

## 2015-03-01 MED ORDER — LIDOCAINE HCL (CARDIAC) 20 MG/ML IV SOLN
INTRAVENOUS | Status: DC | PRN
  Administered 2015-03-01: 100 mg via INTRAVENOUS

## 2015-03-01 MED ORDER — LIDOCAINE HCL (CARDIAC) 20 MG/ML IV SOLN
INTRAVENOUS | Status: AC
  Filled 2015-03-01: qty 5

## 2015-03-01 MED ORDER — SUGAMMADEX SODIUM 200 MG/2ML IV SOLN
INTRAVENOUS | Status: DC | PRN
  Administered 2015-03-01: 200 mg via INTRAVENOUS

## 2015-03-01 MED ORDER — EPHEDRINE SULFATE 50 MG/ML IJ SOLN
INTRAMUSCULAR | Status: AC
  Filled 2015-03-01: qty 1

## 2015-03-01 MED ORDER — MIDAZOLAM HCL 5 MG/5ML IJ SOLN
INTRAMUSCULAR | Status: DC | PRN
  Administered 2015-03-01: 2 mg via INTRAVENOUS

## 2015-03-01 MED ORDER — NALOXONE HCL 0.4 MG/ML IJ SOLN
INTRAMUSCULAR | Status: DC | PRN
  Administered 2015-03-01: .08 ug via INTRAVENOUS

## 2015-03-01 MED ORDER — FENTANYL CITRATE (PF) 100 MCG/2ML IJ SOLN
25.0000 ug | INTRAMUSCULAR | Status: DC | PRN
  Administered 2015-03-01: 50 ug via INTRAVENOUS
  Administered 2015-03-01 (×2): 25 ug via INTRAVENOUS

## 2015-03-01 MED ORDER — CEFAZOLIN SODIUM-DEXTROSE 2-3 GM-% IV SOLR
2.0000 g | INTRAVENOUS | Status: AC
  Administered 2015-03-01: 2 g via INTRAVENOUS

## 2015-03-01 MED ORDER — PROMETHAZINE HCL 25 MG/ML IJ SOLN: 6.2500 mg | INTRAMUSCULAR | Status: DC | PRN

## 2015-03-01 MED ORDER — ROCURONIUM BROMIDE 100 MG/10ML IV SOLN
INTRAVENOUS | Status: DC | PRN
  Administered 2015-03-01 (×2): 20 mg via INTRAVENOUS

## 2015-03-01 MED ORDER — 0.9 % SODIUM CHLORIDE (POUR BTL) OPTIME
TOPICAL | Status: DC | PRN
  Administered 2015-03-01: 1000 mL

## 2015-03-01 MED ORDER — ATROPINE SULFATE 0.4 MG/ML IJ SOLN
INTRAMUSCULAR | Status: AC
  Filled 2015-03-01: qty 2

## 2015-03-01 MED ORDER — MIDAZOLAM HCL 2 MG/2ML IJ SOLN
INTRAMUSCULAR | Status: AC
  Filled 2015-03-01: qty 2

## 2015-03-01 MED ORDER — SUCCINYLCHOLINE CHLORIDE 20 MG/ML IJ SOLN
INTRAMUSCULAR | Status: DC | PRN
  Administered 2015-03-01: 100 mg via INTRAVENOUS

## 2015-03-01 MED ORDER — SODIUM CHLORIDE 0.9 % IJ SOLN
INTRAMUSCULAR | Status: AC
  Filled 2015-03-01: qty 10

## 2015-03-01 MED ORDER — MORPHINE SULFATE (PF) 10 MG/ML IV SOLN: 2.0000 mg | INTRAVENOUS | Status: DC | PRN

## 2015-03-01 MED ORDER — SUGAMMADEX SODIUM 200 MG/2ML IV SOLN
INTRAVENOUS | Status: AC
  Filled 2015-03-01: qty 2

## 2015-03-01 MED ORDER — PROPOFOL 10 MG/ML IV BOLUS
INTRAVENOUS | Status: AC
  Filled 2015-03-01: qty 20

## 2015-03-01 MED ORDER — BUPIVACAINE-EPINEPHRINE 0.25% -1:200000 IJ SOLN
INTRAMUSCULAR | Status: AC
  Filled 2015-03-01: qty 1

## 2015-03-01 MED ORDER — CEFAZOLIN SODIUM-DEXTROSE 2-3 GM-% IV SOLR
INTRAVENOUS | Status: AC
  Filled 2015-03-01: qty 50

## 2015-03-01 MED ORDER — PROPOFOL 10 MG/ML IV BOLUS
INTRAVENOUS | Status: DC | PRN
  Administered 2015-03-01: 200 mg via INTRAVENOUS

## 2015-03-01 MED ORDER — CHLORHEXIDINE GLUCONATE 4 % EX LIQD: 1.0000 "application " | Freq: Once | CUTANEOUS | Status: DC

## 2015-03-01 MED ORDER — BUPIVACAINE-EPINEPHRINE (PF) 0.25% -1:200000 IJ SOLN
INTRAMUSCULAR | Status: DC | PRN
  Administered 2015-03-01: 7 mL via PERINEURAL

## 2015-03-01 SURGICAL SUPPLY — 38 items
APL SKNCLS STERI-STRIP NONHPOA (GAUZE/BANDAGES/DRESSINGS) ×1
APPLIER CLIP 5 13 M/L LIGAMAX5 (MISCELLANEOUS)
BENZOIN TINCTURE PRP APPL 2/3 (GAUZE/BANDAGES/DRESSINGS) ×3 IMPLANT
CABLE HIGH FREQUENCY MONO STRZ (ELECTRODE) ×3 IMPLANT
CHLORAPREP W/TINT 26ML (MISCELLANEOUS) ×3 IMPLANT
CLIP APPLIE 5 13 M/L LIGAMAX5 (MISCELLANEOUS) IMPLANT
CLIP LIGATING HEMO O LOK GREEN (MISCELLANEOUS) ×6 IMPLANT
CLOSURE WOUND 1/2 X4 (GAUZE/BANDAGES/DRESSINGS) ×1
COVER MAYO STAND STRL (DRAPES) IMPLANT
COVER SURGICAL LIGHT HANDLE (MISCELLANEOUS) ×3 IMPLANT
COVER TRANSDUCER ULTRASND (DRAPES) ×3 IMPLANT
DECANTER SPIKE VIAL GLASS SM (MISCELLANEOUS) ×3 IMPLANT
DEVICE TROCAR PUNCTURE CLOSURE (ENDOMECHANICALS) ×3 IMPLANT
DRAPE C-ARM 42X120 X-RAY (DRAPES) IMPLANT
DRAPE LAPAROSCOPIC ABDOMINAL (DRAPES) ×3 IMPLANT
DRAPE UTILITY XL STRL (DRAPES) ×3 IMPLANT
ELECT REM PT RETURN 9FT ADLT (ELECTROSURGICAL) ×3
ELECTRODE REM PT RTRN 9FT ADLT (ELECTROSURGICAL) ×1 IMPLANT
GAUZE SPONGE 2X2 8PLY STRL LF (GAUZE/BANDAGES/DRESSINGS) ×1 IMPLANT
GAUZE SPONGE 4X4 12PLY STRL (GAUZE/BANDAGES/DRESSINGS) IMPLANT
GLOVE BIO SURGEON STRL SZ7.5 (GLOVE) ×3 IMPLANT
GOWN STRL REUS W/TWL XL LVL3 (GOWN DISPOSABLE) ×12 IMPLANT
HEMOSTAT SURGICEL 4X8 (HEMOSTASIS) IMPLANT
KIT BASIN OR (CUSTOM PROCEDURE TRAY) ×3 IMPLANT
NEEDLE INSUFFLATION 14GA 120MM (NEEDLE) ×3 IMPLANT
SCISSORS LAP 5X35 DISP (ENDOMECHANICALS) ×3 IMPLANT
SET CHOLANGIOGRAPH MIX (MISCELLANEOUS) IMPLANT
SET IRRIG TUBING LAPAROSCOPIC (IRRIGATION / IRRIGATOR) ×3 IMPLANT
SPONGE GAUZE 2X2 STER 10/PKG (GAUZE/BANDAGES/DRESSINGS) ×2
STRIP CLOSURE SKIN 1/2X4 (GAUZE/BANDAGES/DRESSINGS) ×2 IMPLANT
SUT MNCRL AB 4-0 PS2 18 (SUTURE) ×3 IMPLANT
TAPE CLOTH SURG 4X10 WHT LF (GAUZE/BANDAGES/DRESSINGS) ×3 IMPLANT
TOWEL OR 17X26 10 PK STRL BLUE (TOWEL DISPOSABLE) ×3 IMPLANT
TOWEL OR NON WOVEN STRL DISP B (DISPOSABLE) ×3 IMPLANT
TRAY LAPAROSCOPIC (CUSTOM PROCEDURE TRAY) ×3 IMPLANT
TROCAR BLADELESS OPT 5 75 (ENDOMECHANICALS) ×3 IMPLANT
TROCAR SLEEVE XCEL 5X75 (ENDOMECHANICALS) ×3 IMPLANT
TROCAR XCEL NON-BLD 11X100MML (ENDOMECHANICALS) ×3 IMPLANT

## 2015-03-01 NOTE — Anesthesia Procedure Notes (Signed)
Procedure Name: Intubation Date/Time: 03/01/2015 7:32 AM Performed by: Lind Covert Pre-anesthesia Checklist: Patient identified, Emergency Drugs available, Suction available and Patient being monitored Patient Re-evaluated:Patient Re-evaluated prior to inductionOxygen Delivery Method: Circle system utilized Preoxygenation: Pre-oxygenation with 100% oxygen Intubation Type: IV induction Ventilation: Mask ventilation without difficulty Laryngoscope Size: 2 and Miller Grade View: Grade I Tube type: Oral Tube size: 7.5 mm Number of attempts: 1 Airway Equipment and Method: Stylet Placement Confirmation: ETT inserted through vocal cords under direct vision and positive ETCO2 Secured at: 23 cm Tube secured with: Tape Dental Injury: Teeth and Oropharynx as per pre-operative assessment

## 2015-03-01 NOTE — Discharge Instructions (Signed)
[facility] ______[facility] °LAPAROSCOPIC SURGERY: POST OP INSTRUCTIONS °Always review your discharge instruction sheet given to you by the facility where your surgery was performed. °IF YOU HAVE DISABILITY OR FAMILY LEAVE FORMS, YOU MUST BRING THEM TO THE OFFICE FOR PROCESSING.   °DO NOT GIVE THEM TO YOUR DOCTOR. ° °1. A prescription for pain medication may be given to you upon discharge.  Take your pain medication as prescribed, if needed.  If narcotic pain medicine is not needed, then you may take acetaminophen (Tylenol) or ibuprofen (Advil) as needed. °2. Take your usually prescribed medications unless otherwise directed. °3. If you need a refill on your pain medication, please contact your pharmacy.  They will contact our office to request authorization. Prescriptions will not be filled after 5pm or on week-ends. °4. You should follow a light diet the first few days after arrival home, such as soup and crackers, etc.  Be sure to include lots of fluids daily. °5. Most patients will experience some swelling and bruising in the area of the incisions.  Ice packs will help.  Swelling and bruising can take several days to resolve.  °6. It is common to experience some constipation if taking pain medication after surgery.  Increasing fluid intake and taking a stool softener (such as Colace) will usually help or prevent this problem from occurring.  A mild laxative (Milk of Magnesia or Miralax) should be taken according to package instructions if there are no bowel movements after 48 hours. °7. Unless discharge instructions indicate otherwise, you may remove your bandages 24-48 hours after surgery, and you may shower at that time.  You may have steri-strips (small skin tapes) in place directly over the incision.  These strips should be left on the skin for 7-10 days.  If your surgeon used skin glue on the incision, you may shower in 24 hours.  The glue will flake off over the next 2-3 weeks.  Any sutures or  staples will be removed at the office during your follow-up visit. °8. ACTIVITIES:  You may resume regular (light) daily activities beginning the next day--such as daily self-care, walking, climbing stairs--gradually increasing activities as tolerated.  You may have sexual intercourse when it is comfortable.  Refrain from any heavy lifting or straining until approved by your doctor. °a. You may drive when you are no longer taking prescription pain medication, you can comfortably wear a seatbelt, and you can safely maneuver your car and apply brakes. °b. RETURN TO WORK:  __________________________________________________________ °9. You should see your doctor in the office for a follow-up appointment approximately 2-3 weeks after your surgery.  Make sure that you call for this appointment within a day or two after you arrive home to insure a convenient appointment time. °10. OTHER INSTRUCTIONS: __________________________________________________________________________________________________________________________ __________________________________________________________________________________________________________________________ °WHEN TO CALL YOUR DOCTOR: °1. Fever over 101.0 °2. Inability to urinate °3. Continued bleeding from incision. °4. Increased pain, redness, or drainage from the incision. °5. Increasing abdominal pain ° °The clinic staff is available to answer your questions during regular business hours.  Please don’t hesitate to call and ask to speak to one of the nurses for clinical concerns.  If you have a medical emergency, go to the nearest emergency room or call 911.  A surgeon from [facility] is always on call at the hospital. °[address], [address], [place], [place]  [address] ? [address] [address], [place], [place]   [address] °[phone] ? [phone] ? FAX [phone] °Web site:   [email or web address] °

## 2015-03-01 NOTE — Op Note (Signed)
03/01/2015  8:15 AM  PATIENT:  [name]  69 y.o. male  PRE-OPERATIVE DIAGNOSIS:  GALLSTONES  POST-OPERATIVE DIAGNOSIS:  GALLSTONES  PROCEDURE:  Procedure(s): LAPAROSCOPIC CHOLECYSTECTOMY (N/A)  SURGEON:  Surgeon(s) and Role:    * Ralene Ok, MD - Primary  ANESTHESIA:   local and general  EBL:   5cc  BLOOD ADMINISTERED:none  DRAINS: none   LOCAL MEDICATIONS USED:  BUPIVICAINE   SPECIMEN:  Source of Specimen:  gallbladder  DISPOSITION OF SPECIMEN:  PATHOLOGY  COUNTS:  YES  TOURNIQUET:  * No tourniquets in log *  DICTATION: .Dragon Dictation  EBL: [id number]   Complications: none   Counts: reported as correct x 2   Findings: Chronic inflammation of the GB and stones  Indications for procedure: Pt is a 69 y/o M with RUQ pain and seen to have gallstones.   Details of the procedure: The patient was taken to the operating and placed in the supine position with bilateral SCDs in place. A time out was called and all facts were verified. A pneumoperitoneum was obtained via A Veress needle technique to a pressure of 64mm of mercury. A 21mm trochar was then placed in the right upper quadrant under visualization, and there were no injuries to any abdominal organs. A 11 mm port was then placed in the umbilical region after infiltrating with local anesthesia under direct visualization. A second epigastric port was placed under direct visualization.   The gallbladder was identified and retracted, the peritoneum was then sharply dissected from the gallbladder and this dissection was carried down to Calot's triangle. The cystic duct was identified and stripped away circumferentially and seen going into the gallbladder 360, the critical angle was obtained.    2 clips were placed proximally one distally and the cystic duct transected. The cystic artery was identified and 2 clips placed proximally and one distally and transected. We then proceeded to remove the gallbladder off the  hepatic fossa with Bovie cautery. A retrieval bag was then placed in the abdomen and gallbladder placed in the bag. The hepatic fossa was then reexamined and hemostasis was achieved with Bovie cautery and was excellent at this portion of the case. The subhepatic fossa and perihepatic fossa was then irrigated until the effluent was clear. The specimen bag and specimen were removed from the abdominal cavity.  The 11 mm trocar fascia was reapproximated with the Endo Close #1 Vicryl x1. The pneumoperitoneum was evacuated and all trochars removed under direct visulalization. The skin was then closed with 4-0 Monocryl and the skin dressed with Steri-Strips, gauze, and tape. The patient was awaken from general anesthesia and taken to the recovery room in stable condition.    PLAN OF CARE: Discharge to home after PACU  PATIENT DISPOSITION:  PACU - hemodynamically stable.   Delay start of Pharmacological VTE agent (>24hrs) due to surgical blood loss or risk of bleeding: not applicable

## 2015-03-01 NOTE — Anesthesia Postprocedure Evaluation (Signed)
Anesthesia Post Note  [name]  Procedure(s) Performed: Procedure(s) (LRB): LAPAROSCOPIC CHOLECYSTECTOMY (N/A)  Patient location during evaluation: PACU Anesthesia Type: General Level of consciousness: awake and alert Pain management: pain level controlled Vital Signs Assessment: post-procedure vital signs reviewed and stable Respiratory status: spontaneous breathing, nonlabored ventilation, respiratory function stable and patient connected to nasal cannula oxygen Cardiovascular status: blood pressure returned to baseline and stable Postop Assessment: no signs of nausea or vomiting Anesthetic complications: no    Last Vitals:  Filed Vitals:   03/01/15 0830 03/01/15 0845  BP: 175/100 163/111  Pulse: 83 80  Temp: 36.6 C   Resp: 22 23    Last Pain:  Filed Vitals:   03/01/15 0851  PainSc: 7                  Petula Rotolo

## 2015-03-01 NOTE — Transfer of Care (Signed)
Immediate Anesthesia Transfer of Care Note  [name]  Procedure(s) Performed: Procedure(s): LAPAROSCOPIC CHOLECYSTECTOMY (N/A)  Patient Location: PACU  Anesthesia Type:General  Level of Consciousness: sedated  Airway & Oxygen Therapy: Patient Spontanous Breathing and Patient connected to face mask oxygen  Post-op Assessment: Report given to RN and Post -op Vital signs reviewed and stable  Post vital signs: Reviewed and stable  Last Vitals:  Filed Vitals:   03/01/15 0540  BP: 133/99  Pulse: 75  Temp: 36.2 C  Resp: 16    Complications: No apparent anesthesia complications

## 2015-03-01 NOTE — H&P (Signed)
History of Present Illness [name]; 11/16/2014 3:08 PM) Patient words: gallbladder.  The patient is a 69 year old male who presents for evaluation of gall stones. The patient is a 69 year old male who is referred by Dr. Shirline Frees for evaluation of symptomatic cholelithiasis. Patient states that he is had epigastric/right upper quadrant pain over the last several months. He states it's been more severe with higher fatty foods. Patient underwent an ultrasound which revealed multiple gallstones. Patient has a history of A. fib and is on Coumadin. Dr. Kenton Kingfisher manages his Coumadin.   Other Problems Marjean Donna, CMA; 11/16/2014 2:39 PM) Atrial Fibrillation Back Pain Cholelithiasis Diabetes Mellitus High blood pressure Sleep Apnea  Past Surgical History Marjean Donna, CMA; 11/16/2014 2:39 PM) Foot Surgery Right. Oral Surgery  Diagnostic Studies History ([place]; 11/16/2014 2:39 PM) Colonoscopy 1-5 years ago  Allergies Davy Pique Bynum, CMA; 11/16/2014 2:41 PM) No Known Drug Allergies09/04/2014  Medication History Davy Pique Bynum, CMA; 11/16/2014 2:41 PM) Digoxin (250MCG Tablet, Oral) Active. Hydrochlorothiazide (25MG  Tablet, Oral) Active. Lisinopril (40MG  Tablet, Oral) Active. MetFORMIN HCl (1000MG  Tablet, Oral) Active. Warfarin Sodium (3MG  Tablet, Oral) Active. Simvastatin (20MG  Tablet, Oral) Active. Omeprazole (20MG  Capsule DR, Oral) Active. Metoprolol Tartrate (50MG  Tablet, Oral) Active. Colchicine (0.6MG  Tablet, Oral) Active. Medications Reconciled  Social History Marjean Donna, CMA; 11/16/2014 2:39 PM) Caffeine use Coffee, Tea. No alcohol use No drug use Tobacco use Never smoker.  Family History Marjean Donna, [place]; 11/16/2014 2:39 PM) Diabetes Mellitus Father. Hypertension Mother.  Review of Systems [name]; 11/16/2014 3:07 PM) General Present- Feeling well. Not Present- Fever. Skin Present- Dryness and Non-Healing Wounds. Not  Present- Change in Wart/Mole, Hives, Jaundice, New Lesions, Rash and Ulcer. HEENT Present- Seasonal Allergies and Wears glasses/contact lenses. Not Present- Earache, Hearing Loss, Hoarseness, Nose Bleed, Oral Ulcers, Ringing in the Ears, Sinus Pain, Sore Throat, Visual Disturbances and Yellow Eyes. Respiratory Not Present- Bloody sputum, Chronic Cough, Difficulty Breathing, Snoring and Wheezing. Cardiovascular Not Present- Chest Pain. Gastrointestinal Present- Abdominal Pain and Indigestion. Not Present- Bloating, Bloody Stool, Change in Bowel Habits, Chronic diarrhea, Constipation, Difficulty Swallowing, Excessive gas, Gets full quickly at meals, Hemorrhoids, Nausea, Rectal Pain and Vomiting. Male Genitourinary Present- Change in Urinary Stream and Impotence. Not Present- Blood in Urine, Frequency, Nocturia, Painful Urination, Urgency and Urine Leakage. Musculoskeletal Present- Back Pain, Joint Pain, Joint Stiffness and Muscle Pain. Not Present- Muscle Weakness and Swelling of Extremities. Neurological Not Present- Decreased Memory, Fainting, Headaches, Numbness, Seizures, Tingling, Tremor, Trouble walking and Weakness. Psychiatric Not Present- Anxiety, Bipolar, Change in Sleep Pattern, Depression, Fearful and Frequent crying. Endocrine Present- Cold Intolerance. Not Present- Excessive Hunger, Hair Changes, Heat Intolerance, Hot flashes and New Diabetes.   BP 133/99 mmHg  Pulse 75  Temp(Src) 97.2 F (36.2 C) (Oral)  Resp 16  Ht 6\' 3"  (1.905 m)  Wt 100.245 kg (221 lb)  BMI 27.62 kg/m2  SpO2 97%    Physical Exam [name]; 11/16/2014 3:07 PM) General Mental Status-Alert. General Appearance-Consistent with stated age. Hydration-Well hydrated. Voice-Normal.  Head and Neck Head-normocephalic, atraumatic with no lesions or palpable masses.  Eye Eyeball - Bilateral-Extraocular movements intact. Sclera/Conjunctiva - Bilateral-No scleral icterus.  Chest and Lung  Exam Chest and lung exam reveals -quiet, even and easy respiratory effort with no use of accessory muscles. Inspection Chest Wall - Normal. Back - normal.  Cardiovascular Cardiovascular examination reveals -normal heart sounds, regular rate and rhythm with no murmurs.  Abdomen Inspection Normal Exam - No Hernias. Palpation/Percussion Normal exam -  Soft, Non Tender, No Rebound tenderness, No Rigidity (guarding) and No hepatosplenomegaly. Auscultation Normal exam - Bowel sounds normal.  Neurologic Neurologic evaluation reveals -alert and oriented x 3 with no impairment of recent or remote memory. Mental Status-Normal.  Musculoskeletal Normal Exam - Left-Upper Extremity Strength Normal and Lower Extremity Strength Normal. Normal Exam - Right-Upper Extremity Strength Normal, Lower Extremity Weakness.    Assessment & Plan [name]; 11/16/2014 3:09 PM) SYMPTOMATIC CHOLELITHIASIS (574.20  K80.20) Impression: 69 year old male sent by cholelithiasis  1. The patient would like to proceed to the operating for a laparoscopic cholecystectomy. 2. Risks and benefits were discussed with the patient to generally include, but not limited to: infection, bleeding, possible need for post op ERCP, damage to the bile ducts, bile leak, and possible need for further surgery. Alternatives were offered and described. All questions were answered and the patient voiced understanding of the procedure and wishes to proceed at this point with a laparoscopic cholecystectomy

## 2015-03-13 DIAGNOSIS — I4891 Unspecified atrial fibrillation: Secondary | ICD-10-CM | POA: Diagnosis not present

## 2015-03-13 DIAGNOSIS — Z7901 Long term (current) use of anticoagulants: Secondary | ICD-10-CM | POA: Diagnosis not present

## 2015-03-19 ENCOUNTER — Ambulatory Visit: Payer: Self-pay | Admitting: Podiatry

## 2015-03-26 ENCOUNTER — Ambulatory Visit (INDEPENDENT_AMBULATORY_CARE_PROVIDER_SITE_OTHER): Payer: Medicare Other

## 2015-03-26 ENCOUNTER — Encounter: Payer: Self-pay | Admitting: Podiatry

## 2015-03-26 ENCOUNTER — Ambulatory Visit (INDEPENDENT_AMBULATORY_CARE_PROVIDER_SITE_OTHER): Payer: Medicare Other | Admitting: Podiatry

## 2015-03-26 VITALS — BP 119/70 | HR 70 | Resp 12

## 2015-03-26 DIAGNOSIS — M79674 Pain in right toe(s): Secondary | ICD-10-CM

## 2015-03-26 DIAGNOSIS — E11621 Type 2 diabetes mellitus with foot ulcer: Secondary | ICD-10-CM

## 2015-03-26 DIAGNOSIS — L89891 Pressure ulcer of other site, stage 1: Secondary | ICD-10-CM

## 2015-03-26 DIAGNOSIS — M79675 Pain in left toe(s): Secondary | ICD-10-CM | POA: Diagnosis not present

## 2015-03-26 DIAGNOSIS — E119 Type 2 diabetes mellitus without complications: Secondary | ICD-10-CM | POA: Diagnosis not present

## 2015-03-26 DIAGNOSIS — L97509 Non-pressure chronic ulcer of other part of unspecified foot with unspecified severity: Secondary | ICD-10-CM

## 2015-03-26 DIAGNOSIS — B351 Tinea unguium: Secondary | ICD-10-CM | POA: Diagnosis not present

## 2015-03-26 DIAGNOSIS — M2042 Other hammer toe(s) (acquired), left foot: Secondary | ICD-10-CM

## 2015-03-26 NOTE — Progress Notes (Signed)
   Subjective:    Patient ID: [name], male    DOB: [birth year], 70 y.o.   MRN: [id number]  HPI: He presents today with a chief complaint of thick yellow dystrophic onychomycotic nails he's also complaining of a painful hammertoe deformity third digit of the left foot. He states this been present for quite some time but then now the toe is becoming discolored and he's concerned. He states he has diabetes but is under good control. He denies any trauma to the toe or any discharge. He denies fever chills nausea vomiting muscle aches or pains.    Review of Systems  Skin: Positive for color change.       Objective:   Physical Exam: Vital signs are stable he is alert and oriented 3 pulses are strongly palpable. Neurologic sensorium is intact for Semmes-Weinstein monofilament. Deep tendon reflexes are intact bilaterally muscle strength +5 over 5 dorsiflexion flexors and inverters everters all digits of musculature is intact. Orthopedic evaluation does demonstrate rigid hammertoe deformity right foot as opposed to the left foot which doesn't Mr. a flexible hammertoe deformities left foot. Third toe of the left foot demonstrates a distal clavus with bleeding beneath the lesion which was debrided today does demonstrate a superficial ulceration with no signs of infection. His toenails are thick yellow dystrophic with mycotic and painful to palpation as well as debridement. 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        Assessment & Plan:  Assessment: Pain and limp secondary to onychomycosis and pre-ulcerative lesion third digit left foot.  Plan: Debrided all reactive hyperkeratoses debrided toenails 1 through 5 bilateral. Dispense cushions and pads and demonstrated the use to him for his third toe to help alleviate the pressure on the tip of the toe. He understands this is amenable to it will follow up with me in 1 month.

## 2015-04-11 DIAGNOSIS — Z7901 Long term (current) use of anticoagulants: Secondary | ICD-10-CM | POA: Diagnosis not present

## 2015-04-11 DIAGNOSIS — I4891 Unspecified atrial fibrillation: Secondary | ICD-10-CM | POA: Diagnosis not present

## 2015-05-07 DIAGNOSIS — Z7901 Long term (current) use of anticoagulants: Secondary | ICD-10-CM | POA: Diagnosis not present

## 2015-05-07 DIAGNOSIS — I4891 Unspecified atrial fibrillation: Secondary | ICD-10-CM | POA: Diagnosis not present

## 2015-05-20 DIAGNOSIS — Z7901 Long term (current) use of anticoagulants: Secondary | ICD-10-CM | POA: Diagnosis not present

## 2015-05-20 DIAGNOSIS — I4891 Unspecified atrial fibrillation: Secondary | ICD-10-CM | POA: Diagnosis not present

## 2015-06-20 DIAGNOSIS — I4891 Unspecified atrial fibrillation: Secondary | ICD-10-CM | POA: Diagnosis not present

## 2015-06-20 DIAGNOSIS — Z7901 Long term (current) use of anticoagulants: Secondary | ICD-10-CM | POA: Diagnosis not present

## 2015-07-02 ENCOUNTER — Encounter: Payer: Self-pay | Admitting: Podiatry

## 2015-07-02 ENCOUNTER — Ambulatory Visit (INDEPENDENT_AMBULATORY_CARE_PROVIDER_SITE_OTHER): Payer: Medicare Other | Admitting: Podiatry

## 2015-07-02 VITALS — BP 141/96 | HR 69 | Resp 12

## 2015-07-02 DIAGNOSIS — B351 Tinea unguium: Secondary | ICD-10-CM | POA: Diagnosis not present

## 2015-07-02 DIAGNOSIS — E119 Type 2 diabetes mellitus without complications: Secondary | ICD-10-CM | POA: Diagnosis not present

## 2015-07-02 DIAGNOSIS — M79676 Pain in unspecified toe(s): Secondary | ICD-10-CM

## 2015-07-02 DIAGNOSIS — Q828 Other specified congenital malformations of skin: Secondary | ICD-10-CM | POA: Diagnosis not present

## 2015-07-02 NOTE — Progress Notes (Signed)
He presents today for follow-up of his elongated painful toenails and a callus in the third digit of the left foot. He states that he has recently purchased new shoes that are longer and wider which really alleviate the symptoms regarding his third digit left foot. His wife is concerned that the toe is black and that it may fall off. He denies major changes with his diabetes denies fever chills nausea vomiting muscle aches and pains.  Objective: Pulses are strongly palpable vital signs are noted noted to be normal. Hammertoe deformities bilateral resulting in a distal clavus third digit left foot with bleeding beneath it. Was debrided today does not demonstrate any type of ulceration. Toenails are thick yellow dystrophic onychomycotic and painful on palpation. No open skin lesions or wounds.  Assessment: Diabetes mellitus with painless secondary to onychomycosis and porokeratosis.  Plan: Debridement of all reactive hyperkeratosis and debridement of toenails bilateral. Follow up with him in 3 months

## 2015-07-16 DIAGNOSIS — I4891 Unspecified atrial fibrillation: Secondary | ICD-10-CM | POA: Diagnosis not present

## 2015-07-16 DIAGNOSIS — Z7901 Long term (current) use of anticoagulants: Secondary | ICD-10-CM | POA: Diagnosis not present

## 2015-07-26 DIAGNOSIS — Z8679 Personal history of other diseases of the circulatory system: Secondary | ICD-10-CM | POA: Diagnosis not present

## 2015-07-26 DIAGNOSIS — Z7901 Long term (current) use of anticoagulants: Secondary | ICD-10-CM | POA: Diagnosis not present

## 2015-08-23 DIAGNOSIS — Z8679 Personal history of other diseases of the circulatory system: Secondary | ICD-10-CM | POA: Diagnosis not present

## 2015-08-23 DIAGNOSIS — Z7901 Long term (current) use of anticoagulants: Secondary | ICD-10-CM | POA: Diagnosis not present

## 2015-09-25 DIAGNOSIS — E119 Type 2 diabetes mellitus without complications: Secondary | ICD-10-CM | POA: Diagnosis not present

## 2015-09-25 DIAGNOSIS — M545 Low back pain: Secondary | ICD-10-CM | POA: Diagnosis not present

## 2015-09-25 DIAGNOSIS — I4891 Unspecified atrial fibrillation: Secondary | ICD-10-CM | POA: Diagnosis not present

## 2015-09-25 DIAGNOSIS — I10 Essential (primary) hypertension: Secondary | ICD-10-CM | POA: Diagnosis not present

## 2015-09-25 DIAGNOSIS — Z0000 Encounter for general adult medical examination without abnormal findings: Secondary | ICD-10-CM | POA: Diagnosis not present

## 2015-09-25 DIAGNOSIS — E782 Mixed hyperlipidemia: Secondary | ICD-10-CM | POA: Diagnosis not present

## 2015-09-25 DIAGNOSIS — Z7901 Long term (current) use of anticoagulants: Secondary | ICD-10-CM | POA: Diagnosis not present

## 2015-09-25 DIAGNOSIS — Z1211 Encounter for screening for malignant neoplasm of colon: Secondary | ICD-10-CM | POA: Diagnosis not present

## 2015-09-25 DIAGNOSIS — N183 Chronic kidney disease, stage 3 (moderate): Secondary | ICD-10-CM | POA: Diagnosis not present

## 2015-09-25 DIAGNOSIS — Z125 Encounter for screening for malignant neoplasm of prostate: Secondary | ICD-10-CM | POA: Diagnosis not present

## 2015-10-01 ENCOUNTER — Ambulatory Visit (INDEPENDENT_AMBULATORY_CARE_PROVIDER_SITE_OTHER): Payer: Medicare Other | Admitting: Podiatry

## 2015-10-01 ENCOUNTER — Encounter: Payer: Self-pay | Admitting: Podiatry

## 2015-10-01 DIAGNOSIS — Q828 Other specified congenital malformations of skin: Secondary | ICD-10-CM | POA: Diagnosis not present

## 2015-10-01 DIAGNOSIS — M79676 Pain in unspecified toe(s): Secondary | ICD-10-CM | POA: Diagnosis not present

## 2015-10-01 DIAGNOSIS — L97529 Non-pressure chronic ulcer of other part of left foot with unspecified severity: Secondary | ICD-10-CM

## 2015-10-01 DIAGNOSIS — E11621 Type 2 diabetes mellitus with foot ulcer: Secondary | ICD-10-CM

## 2015-10-01 DIAGNOSIS — B351 Tinea unguium: Secondary | ICD-10-CM

## 2015-10-01 NOTE — Progress Notes (Signed)
He presents today with chief complaint of painful elongated toenails as well as painful ulcer to the distal aspect of the third digit left foot. He relates trauma in the past in this foot may have resulted in the rigidity of the toes which may have then resulted in the ulceration he claims. He denies fever chills nausea vomiting muscle aches and pains.  Objective: Vital signs are stable alert and oriented 3 pulses are palpable. Toenails are thick yellow dystrophic onychomycotic and painful palpation third digit of the left foot doesn't stress some mild edema no erythema saline as drainage or odor distal clavus is noted with very thick callus. Once debrided does demonstrate a superficial ulceration without any infection.  Assessment: Pain in limb secondary to onychomycosis and ulcerative lesion distal third toe left without complications.  Plan: At this point I debrided all reactive hyperkeratotic tissue debrided all onychomycotic tissue and will follow up with him in 3 months. He will watch for signs and symptoms of infection notify me if there are any. We also discussed the possibility of a disarticulation distal aspect of the toe.

## 2015-10-21 DIAGNOSIS — M2042 Other hammer toe(s) (acquired), left foot: Secondary | ICD-10-CM | POA: Diagnosis not present

## 2015-10-21 DIAGNOSIS — E119 Type 2 diabetes mellitus without complications: Secondary | ICD-10-CM | POA: Diagnosis not present

## 2015-10-21 DIAGNOSIS — L97521 Non-pressure chronic ulcer of other part of left foot limited to breakdown of skin: Secondary | ICD-10-CM | POA: Diagnosis not present

## 2015-10-23 DIAGNOSIS — I4891 Unspecified atrial fibrillation: Secondary | ICD-10-CM | POA: Diagnosis not present

## 2015-10-23 DIAGNOSIS — Z7901 Long term (current) use of anticoagulants: Secondary | ICD-10-CM | POA: Diagnosis not present

## 2015-10-24 DIAGNOSIS — L97521 Non-pressure chronic ulcer of other part of left foot limited to breakdown of skin: Secondary | ICD-10-CM | POA: Diagnosis not present

## 2015-10-24 DIAGNOSIS — I87322 Chronic venous hypertension (idiopathic) with inflammation of left lower extremity: Secondary | ICD-10-CM | POA: Diagnosis not present

## 2015-10-24 DIAGNOSIS — E1142 Type 2 diabetes mellitus with diabetic polyneuropathy: Secondary | ICD-10-CM | POA: Diagnosis not present

## 2015-10-24 DIAGNOSIS — M2042 Other hammer toe(s) (acquired), left foot: Secondary | ICD-10-CM | POA: Diagnosis not present

## 2015-10-31 DIAGNOSIS — Z7901 Long term (current) use of anticoagulants: Secondary | ICD-10-CM | POA: Diagnosis not present

## 2015-10-31 DIAGNOSIS — I4891 Unspecified atrial fibrillation: Secondary | ICD-10-CM | POA: Diagnosis not present

## 2015-11-08 DIAGNOSIS — Z7901 Long term (current) use of anticoagulants: Secondary | ICD-10-CM | POA: Diagnosis not present

## 2015-11-08 DIAGNOSIS — I4891 Unspecified atrial fibrillation: Secondary | ICD-10-CM | POA: Diagnosis not present

## 2015-11-28 DIAGNOSIS — M2042 Other hammer toe(s) (acquired), left foot: Secondary | ICD-10-CM | POA: Diagnosis not present

## 2015-12-09 DIAGNOSIS — Z7901 Long term (current) use of anticoagulants: Secondary | ICD-10-CM | POA: Diagnosis not present

## 2015-12-09 DIAGNOSIS — I4891 Unspecified atrial fibrillation: Secondary | ICD-10-CM | POA: Diagnosis not present

## 2015-12-31 ENCOUNTER — Ambulatory Visit: Payer: Medicare Other | Admitting: Podiatry

## 2016-01-09 DIAGNOSIS — Z7901 Long term (current) use of anticoagulants: Secondary | ICD-10-CM | POA: Diagnosis not present

## 2016-01-09 DIAGNOSIS — I4891 Unspecified atrial fibrillation: Secondary | ICD-10-CM | POA: Diagnosis not present

## 2016-01-28 ENCOUNTER — Encounter (INDEPENDENT_AMBULATORY_CARE_PROVIDER_SITE_OTHER): Payer: Self-pay | Admitting: Orthopedic Surgery

## 2016-01-28 ENCOUNTER — Ambulatory Visit (INDEPENDENT_AMBULATORY_CARE_PROVIDER_SITE_OTHER): Payer: Medicare Other

## 2016-01-28 ENCOUNTER — Ambulatory Visit (INDEPENDENT_AMBULATORY_CARE_PROVIDER_SITE_OTHER): Payer: Medicare Other | Admitting: Orthopedic Surgery

## 2016-01-28 VITALS — Ht 75.0 in | Wt 228.0 lb

## 2016-01-28 DIAGNOSIS — M25531 Pain in right wrist: Secondary | ICD-10-CM

## 2016-01-28 DIAGNOSIS — M1A0310 Idiopathic chronic gout, right wrist, without tophus (tophi): Secondary | ICD-10-CM

## 2016-01-28 LAB — URIC ACID: URIC ACID, SERUM: 7.6 mg/dL (ref 4.0–8.0)

## 2016-01-28 MED ORDER — ALLOPURINOL 100 MG PO TABS: 100.0000 mg | ORAL_TABLET | Freq: Two times a day (BID) | ORAL | 3 refills | Status: DC

## 2016-01-28 MED ORDER — COLCHICINE 0.6 MG PO TABS: 0.6000 mg | ORAL_TABLET | Freq: Two times a day (BID) | ORAL | 3 refills | Status: DC

## 2016-01-28 NOTE — Addendum Note (Signed)
Addended by: Meridee Score on: 01/28/2016 02:50 PM   Modules accepted: Orders

## 2016-01-28 NOTE — Progress Notes (Addendum)
Office Visit Note   [name]           Date of Birth: 1946/02/04           MRN: [id number] Visit Date: 01/28/2016              Requested by: Shirline Frees, MD [address], [place] [address] PCP: Shirline Frees, MD   Assessment & Plan: Visit Diagnoses:  1. 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 Pain in right wrist     Plan: We will draws uric acid level today. Patient does have a prescription for colchicine but he has not been taking it. Recommend that he take the colchicine twice a day until symptoms resolve and he may discontinue the colchicine prescription also provided for allopurinol to take twice a day 100 mg. Patient is to use his Velcro splint which has a firm support on the wrist. Patient states he also has some Voltaren gel at home and recommend the use of Voltaren gel on the wrist during the acute episode. Possible repeat uric acid level at follow-up. We'll call him with the results drawn today.  Follow-Up Instructions: Return in about 2 weeks (around 02/11/2016).   Orders:  Orders Placed This Encounter  Procedures  . XR Wrist 2 Views Right  . Uric acid   Meds ordered this encounter  Medications  . allopurinol (ZYLOPRIM) 100 MG tablet    Sig: Take 1 tablet (100 mg total) by mouth 2 (two) times daily.    Dispense:  60 tablet    Refill:  3  . colchicine 0.6 MG tablet    Sig: Take 1 tablet (0.6 mg total) by mouth 2 (two) times daily. Take BID for acute gout attack, stop when acute pain resolves    Dispense:  60 tablet    Refill:  3      Procedures: No procedures performed   Clinical Data: No additional findings.   Subjective: Chief Complaint  Patient presents with  . Right Wrist - Pain    Pain x 2 weeks    Patient presents today with right wrist pain. He was moving approximately 2 weeks ago. He twisted wrist to side when trying to open a drawer and heard a pop. It initially was getting better  but now he is doing worse. He has swelling. He is wearing wrist brace. Soreness in between fingers, he also has pain dorsal wrist. He also had prior injury trying to move a piece of electronic equipment, after injury he had to do therapy for 6 weeks to rehab his right hand. He was told after therapy he only had 85% usage of right hand. Patient is right hand dominant.   Patient states the pop was, like noisy here with new track and knuckle. Patient states she was just trying to pull open a drawer.  Review of Systems Patient is currently driving a car for part-time work  Objective: Vital Signs: Ht 6\' 3"  (1.905 m)   Wt 228 lb (103.4 kg)   BMI 28.50 kg/m   Physical Exam Patient is alert oriented no adenopathy well-dressed normal affect normal respiratory effort.  Patient has a normal gait. Examination he has warmth and swelling of the right wrist there is no cellulitis. He is globally tender to palpation around the wrist. His hand is neurovascularly intact with good flexion and extension actively of the fingers. He is most tender to palpation over the TFCC first  dorsal extensor compartment is nontender no evidence of flexor tenosynovium right is no evidence of a fracture.  Ortho Exam  Specialty Comments:  No specialty comments available.  Imaging: Xr Wrist 2 Views Right  Result Date: 01/28/2016 Two-view radiographs of the right wrist shows some mild arthritic changes no evidence of fracture no evidence of a scaphoid fracture no evidence of a scapholunate disassociation. Patient does have some calcification of the TFCC.    PMFS History: Patient Active Problem List   Diagnosis Date Noted  . Mitral insufficiency 09/23/2011  . Hypertension   . Permanent atrial fibrillation ([place])   . Chronic anticoagulation   . Pulmonary hypertension   . Hyperlipidemia    Past Medical History:  Diagnosis Date  . Chronic anticoagulation   . Complication of anesthesia    ' hard time waking up" per  patient   . Diabetes mellitus    type 2  . GERD (gastroesophageal reflux disease)   . Hyperlipidemia   . Hypertension   . Mitral regurgitation    a. Echo (09/2011):  EF 55%, mod MR, severe LAE, severe RAE, PASP 57-61, trivial eff  . Permanent atrial fibrillation ([facility])    chronic atrial fib  . Pulmonary hypertension   . Pulmonary hypertension     Family History  Problem Relation Age of Onset  . Hypertension Mother   . Alzheimer's disease Mother   . Heart disease Father     cabgx2, respiratory failure, pacemaker    Past Surgical History:  Procedure Laterality Date  . CARDIOVERSION    . CHOLECYSTECTOMY N/A 03/01/2015   Procedure: LAPAROSCOPIC CHOLECYSTECTOMY;  Surgeon: Ralene Ok, MD;  Location: [facility];  Service: General;  Laterality: N/A;  . spur on heel     Social History   Occupational History  . Not on file.   Social History Main Topics  . Smoking status: Never Smoker  . Smokeless tobacco: Never Used  . Alcohol use Yes     Comment: margarita   . Drug use: No  . Sexual activity: Not on file

## 2016-01-28 NOTE — Addendum Note (Signed)
Addended by: Maxcine Ham on: 01/28/2016 02:56 PM   Modules accepted: Orders

## 2016-01-29 ENCOUNTER — Other Ambulatory Visit (INDEPENDENT_AMBULATORY_CARE_PROVIDER_SITE_OTHER): Payer: Self-pay

## 2016-02-11 ENCOUNTER — Ambulatory Visit (INDEPENDENT_AMBULATORY_CARE_PROVIDER_SITE_OTHER): Payer: Medicare Other | Admitting: Orthopedic Surgery

## 2016-02-17 ENCOUNTER — Ambulatory Visit (INDEPENDENT_AMBULATORY_CARE_PROVIDER_SITE_OTHER): Payer: Medicare Other | Admitting: Family

## 2016-02-17 ENCOUNTER — Encounter (INDEPENDENT_AMBULATORY_CARE_PROVIDER_SITE_OTHER): Payer: Self-pay | Admitting: Orthopedic Surgery

## 2016-02-17 DIAGNOSIS — M1A09X0 Idiopathic chronic gout, multiple sites, without tophus (tophi): Secondary | ICD-10-CM

## 2016-02-17 NOTE — Progress Notes (Signed)
Office Visit Note   [name]           Date of Birth: May 10, 1945           MRN: [id number] Visit Date: 02/17/2016              Requested by: Shirline Frees, MD [address] [place], [place] [address] PCP: Shirline Frees, MD   Assessment & Plan: Visit Diagnoses:  1. Idiopathic chronic gout of multiple sites without tophus     Plan: Continue with allopurinol daily. Colchicine for flares. We will follow-up with him in 2 months to check a uric acid at that time.  Follow-Up Instructions: Return in about 2 months (around 04/19/2016) for gout f/u, ck uric acid.   Orders:  No orders of the defined types were placed in this encounter.  No orders of the defined types were placed in this encounter.     Procedures: No procedures performed   Clinical Data: No additional findings.   Subjective: Chief Complaint  Patient presents with  . Right Wrist - Follow-up    Patient is a 70 year old gentleman seen in follow up for gout and right wrist pain. He did have uric acid drawn two weeks prior, and was 7.6. Allopurinol dose was increased at that time, taking colchicine as well. He is having to get the colchicine medication over from [place].  Is pleased with his improvement and pain reduction. No concerns today.      Review of Systems  Constitutional: Negative for chills and fever.     Objective: Vital Signs: There were no vitals taken for this visit.  Physical Exam  Constitutional: He is oriented to person, place, and time. He appears well-developed and well-nourished.  Pulmonary/Chest: Effort normal.  Neurological: He is alert and oriented to person, place, and time.  Psychiatric: He has a normal mood and affect.  Nursing note reviewed.   Right Hand Exam   Tenderness  The patient is experiencing tenderness in the dorsal area.  Range of Motion  The patient has normal right wrist ROM.   Muscle Strength  The patient has normal right  wrist strength.  Other  Erythema: absent Pulse: present    Does have a little tenderness dorsally over the MCP of the fourth finger.  Specialty Comments:  No specialty comments available.  Imaging: No results found.   PMFS History: Patient Active Problem List   Diagnosis Date Noted  . Mitral insufficiency 09/23/2011  . Hypertension   . Permanent atrial fibrillation ([place])   . Chronic anticoagulation   . Pulmonary hypertension   . Hyperlipidemia    Past Medical History:  Diagnosis Date  . Chronic anticoagulation   . Complication of anesthesia    ' hard time waking up" per patient   . Diabetes mellitus    type 2  . GERD (gastroesophageal reflux disease)   . Hyperlipidemia   . Hypertension   . Mitral regurgitation    a. Echo (09/2011):  EF 55%, mod MR, severe LAE, severe RAE, PASP 57-61, trivial eff  . Permanent atrial fibrillation (HCC)    chronic atrial fib  . Pulmonary hypertension   . Pulmonary hypertension     Family History  Problem Relation Age of Onset  . Hypertension Mother   . Alzheimer's disease Mother   . Heart disease Father     cabgx2, respiratory failure, pacemaker    Past Surgical History:  Procedure Laterality Date  . CARDIOVERSION    .  CHOLECYSTECTOMY N/A 03/01/2015   Procedure: LAPAROSCOPIC CHOLECYSTECTOMY;  Surgeon: Ralene Ok, MD;  Location: [facility];  Service: General;  Laterality: N/A;  . spur on heel     Social History   Occupational History  . Not on file.   Social History Main Topics  . Smoking status: Never Smoker  . Smokeless tobacco: Never Used  . Alcohol use Yes     Comment: margarita   . Drug use: No  . Sexual activity: Not on file

## 2016-02-18 DIAGNOSIS — Z7901 Long term (current) use of anticoagulants: Secondary | ICD-10-CM | POA: Diagnosis not present

## 2016-02-18 DIAGNOSIS — I4891 Unspecified atrial fibrillation: Secondary | ICD-10-CM | POA: Diagnosis not present

## 2016-02-25 ENCOUNTER — Other Ambulatory Visit: Payer: Self-pay | Admitting: Family Medicine

## 2016-02-25 ENCOUNTER — Ambulatory Visit
Admission: RE | Admit: 2016-02-25 | Discharge: 2016-02-25 | Disposition: A | Discharge status: Home / Self Care | Payer: Medicare Other | Source: Ambulatory Visit | Attending: Family Medicine | Admitting: Family Medicine

## 2016-02-25 DIAGNOSIS — M7989 Other specified soft tissue disorders: Secondary | ICD-10-CM | POA: Diagnosis not present

## 2016-02-25 DIAGNOSIS — M109 Gout, unspecified: Secondary | ICD-10-CM

## 2016-02-25 DIAGNOSIS — I10 Essential (primary) hypertension: Secondary | ICD-10-CM | POA: Diagnosis not present

## 2016-02-25 DIAGNOSIS — Z23 Encounter for immunization: Secondary | ICD-10-CM | POA: Diagnosis not present

## 2016-04-08 DIAGNOSIS — E782 Mixed hyperlipidemia: Secondary | ICD-10-CM | POA: Diagnosis not present

## 2016-04-08 DIAGNOSIS — Z7901 Long term (current) use of anticoagulants: Secondary | ICD-10-CM | POA: Diagnosis not present

## 2016-04-08 DIAGNOSIS — I10 Essential (primary) hypertension: Secondary | ICD-10-CM | POA: Diagnosis not present

## 2016-04-08 DIAGNOSIS — M109 Gout, unspecified: Secondary | ICD-10-CM | POA: Diagnosis not present

## 2016-04-08 DIAGNOSIS — R600 Localized edema: Secondary | ICD-10-CM | POA: Diagnosis not present

## 2016-04-08 DIAGNOSIS — E119 Type 2 diabetes mellitus without complications: Secondary | ICD-10-CM | POA: Diagnosis not present

## 2016-04-08 DIAGNOSIS — N183 Chronic kidney disease, stage 3 (moderate): Secondary | ICD-10-CM | POA: Diagnosis not present

## 2016-04-08 DIAGNOSIS — I4891 Unspecified atrial fibrillation: Secondary | ICD-10-CM | POA: Diagnosis not present

## 2016-04-22 DIAGNOSIS — Z7901 Long term (current) use of anticoagulants: Secondary | ICD-10-CM | POA: Diagnosis not present

## 2016-04-22 DIAGNOSIS — I4891 Unspecified atrial fibrillation: Secondary | ICD-10-CM | POA: Diagnosis not present

## 2016-05-19 DIAGNOSIS — I4891 Unspecified atrial fibrillation: Secondary | ICD-10-CM | POA: Diagnosis not present

## 2016-05-19 DIAGNOSIS — Z7901 Long term (current) use of anticoagulants: Secondary | ICD-10-CM | POA: Diagnosis not present

## 2016-06-08 ENCOUNTER — Other Ambulatory Visit (INDEPENDENT_AMBULATORY_CARE_PROVIDER_SITE_OTHER): Payer: Self-pay | Admitting: Orthopedic Surgery

## 2016-06-16 DIAGNOSIS — Z7901 Long term (current) use of anticoagulants: Secondary | ICD-10-CM | POA: Diagnosis not present

## 2016-06-16 DIAGNOSIS — I4891 Unspecified atrial fibrillation: Secondary | ICD-10-CM | POA: Diagnosis not present

## 2016-07-06 DIAGNOSIS — Z7901 Long term (current) use of anticoagulants: Secondary | ICD-10-CM | POA: Diagnosis not present

## 2016-07-06 DIAGNOSIS — Z8679 Personal history of other diseases of the circulatory system: Secondary | ICD-10-CM | POA: Diagnosis not present

## 2016-07-22 DIAGNOSIS — Z7901 Long term (current) use of anticoagulants: Secondary | ICD-10-CM | POA: Diagnosis not present

## 2016-08-27 ENCOUNTER — Encounter (HOSPITAL_COMMUNITY): Payer: Self-pay | Admitting: Emergency Medicine

## 2016-08-27 ENCOUNTER — Inpatient Hospital Stay (HOSPITAL_COMMUNITY)
Admission: EM | Admit: 2016-08-27 | Discharge: 2016-08-28 | DRG: 684 | Disposition: A | Discharge status: Home / Self Care | Payer: Medicare Other | Attending: Internal Medicine | Admitting: Internal Medicine

## 2016-08-27 ENCOUNTER — Emergency Department (HOSPITAL_COMMUNITY): Payer: Medicare Other

## 2016-08-27 DIAGNOSIS — E785 Hyperlipidemia, unspecified: Secondary | ICD-10-CM | POA: Diagnosis present

## 2016-08-27 DIAGNOSIS — M109 Gout, unspecified: Secondary | ICD-10-CM | POA: Diagnosis present

## 2016-08-27 DIAGNOSIS — R634 Abnormal weight loss: Secondary | ICD-10-CM | POA: Diagnosis present

## 2016-08-27 DIAGNOSIS — I482 Chronic atrial fibrillation: Secondary | ICD-10-CM | POA: Diagnosis not present

## 2016-08-27 DIAGNOSIS — N179 Acute kidney failure, unspecified: Principal | ICD-10-CM | POA: Diagnosis present

## 2016-08-27 DIAGNOSIS — M1A30X0 Chronic gout due to renal impairment, unspecified site, without tophus (tophi): Secondary | ICD-10-CM

## 2016-08-27 DIAGNOSIS — I340 Nonrheumatic mitral (valve) insufficiency: Secondary | ICD-10-CM | POA: Diagnosis present

## 2016-08-27 DIAGNOSIS — I272 Pulmonary hypertension, unspecified: Secondary | ICD-10-CM | POA: Diagnosis present

## 2016-08-27 DIAGNOSIS — Z79899 Other long term (current) drug therapy: Secondary | ICD-10-CM | POA: Diagnosis not present

## 2016-08-27 DIAGNOSIS — N183 Chronic kidney disease, stage 3 (moderate): Secondary | ICD-10-CM | POA: Diagnosis not present

## 2016-08-27 DIAGNOSIS — Z6827 Body mass index (BMI) 27.0-27.9, adult: Secondary | ICD-10-CM

## 2016-08-27 DIAGNOSIS — I480 Paroxysmal atrial fibrillation: Secondary | ICD-10-CM | POA: Diagnosis present

## 2016-08-27 DIAGNOSIS — I4891 Unspecified atrial fibrillation: Secondary | ICD-10-CM | POA: Diagnosis not present

## 2016-08-27 DIAGNOSIS — N189 Chronic kidney disease, unspecified: Secondary | ICD-10-CM | POA: Diagnosis not present

## 2016-08-27 DIAGNOSIS — E782 Mixed hyperlipidemia: Secondary | ICD-10-CM | POA: Diagnosis not present

## 2016-08-27 DIAGNOSIS — E119 Type 2 diabetes mellitus without complications: Secondary | ICD-10-CM

## 2016-08-27 DIAGNOSIS — E1122 Type 2 diabetes mellitus with diabetic chronic kidney disease: Secondary | ICD-10-CM | POA: Diagnosis not present

## 2016-08-27 DIAGNOSIS — I4821 Permanent atrial fibrillation: Secondary | ICD-10-CM | POA: Diagnosis present

## 2016-08-27 DIAGNOSIS — R5383 Other fatigue: Secondary | ICD-10-CM | POA: Diagnosis not present

## 2016-08-27 DIAGNOSIS — I10 Essential (primary) hypertension: Secondary | ICD-10-CM | POA: Diagnosis not present

## 2016-08-27 DIAGNOSIS — I129 Hypertensive chronic kidney disease with stage 1 through stage 4 chronic kidney disease, or unspecified chronic kidney disease: Secondary | ICD-10-CM | POA: Diagnosis present

## 2016-08-27 DIAGNOSIS — R600 Localized edema: Secondary | ICD-10-CM | POA: Diagnosis not present

## 2016-08-27 DIAGNOSIS — R1310 Dysphagia, unspecified: Secondary | ICD-10-CM | POA: Diagnosis not present

## 2016-08-27 DIAGNOSIS — Z8249 Family history of ischemic heart disease and other diseases of the circulatory system: Secondary | ICD-10-CM | POA: Diagnosis not present

## 2016-08-27 DIAGNOSIS — E875 Hyperkalemia: Secondary | ICD-10-CM

## 2016-08-27 DIAGNOSIS — Z7984 Long term (current) use of oral hypoglycemic drugs: Secondary | ICD-10-CM

## 2016-08-27 DIAGNOSIS — Z7901 Long term (current) use of anticoagulants: Secondary | ICD-10-CM

## 2016-08-27 DIAGNOSIS — K219 Gastro-esophageal reflux disease without esophagitis: Secondary | ICD-10-CM | POA: Diagnosis present

## 2016-08-27 HISTORY — DX: Unspecified atrial fibrillation: I48.91

## 2016-08-27 LAB — COMPREHENSIVE METABOLIC PANEL
ALBUMIN: 4.2 g/dL (ref 3.5–5.0)
ALK PHOS: 49 U/L (ref 38–126)
ALT: 14 U/L — ABNORMAL LOW (ref 17–63)
AST: 16 U/L (ref 15–41)
Anion gap: 7 (ref 5–15)
BILIRUBIN TOTAL: 0.6 mg/dL (ref 0.3–1.2)
BUN: 80 mg/dL — AB (ref 6–20)
CALCIUM: 9.3 mg/dL (ref 8.9–10.3)
CO2: 21 mmol/L — ABNORMAL LOW (ref 22–32)
CREATININE: 3.27 mg/dL — AB (ref 0.61–1.24)
Chloride: 113 mmol/L — ABNORMAL HIGH (ref 101–111)
GFR calc Af Amer: 21 mL/min — ABNORMAL LOW (ref >60)
GFR, EST NON AFRICAN AMERICAN: 18 mL/min — AB (ref >60)
GLUCOSE: 132 mg/dL — AB (ref 65–99)
Potassium: 5.9 mmol/L — ABNORMAL HIGH (ref 3.5–5.1)
Sodium: 141 mmol/L (ref 135–145)
TOTAL PROTEIN: 7.2 g/dL (ref 6.5–8.1)

## 2016-08-27 LAB — CBC WITH DIFFERENTIAL/PLATELET
BASOS ABS: 0 10*3/uL (ref 0.0–0.1)
BASOS PCT: 0 %
Eosinophils Absolute: 0.3 10*3/uL (ref 0.0–0.7)
Eosinophils Relative: 3 %
HCT: 35.4 % — ABNORMAL LOW (ref 39.0–52.0)
Hemoglobin: 11.7 g/dL — ABNORMAL LOW (ref 13.0–17.0)
LYMPHS PCT: 18 %
Lymphs Abs: 2 10*3/uL (ref 0.7–4.0)
MCH: 33.6 pg (ref 26.0–34.0)
MCHC: 33.1 g/dL (ref 30.0–36.0)
MCV: 101.7 fL — AB (ref 78.0–100.0)
MONO ABS: 0.8 10*3/uL (ref 0.1–1.0)
Monocytes Relative: 8 %
Neutro Abs: 7.7 10*3/uL (ref 1.7–7.7)
Neutrophils Relative %: 71 %
PLATELETS: 192 10*3/uL (ref 150–400)
RBC: 3.48 MIL/uL — ABNORMAL LOW (ref 4.22–5.81)
RDW: 14.7 % (ref 11.5–15.5)
WBC: 10.9 10*3/uL — ABNORMAL HIGH (ref 4.0–10.5)

## 2016-08-27 LAB — URINALYSIS, ROUTINE W REFLEX MICROSCOPIC
BILIRUBIN URINE: NEGATIVE
Glucose, UA: NEGATIVE mg/dL
HGB URINE DIPSTICK: NEGATIVE
Ketones, ur: NEGATIVE mg/dL
Leukocytes, UA: NEGATIVE
Nitrite: NEGATIVE
Protein, ur: NEGATIVE mg/dL
SPECIFIC GRAVITY, URINE: 1.013 (ref 1.005–1.030)
pH: 5 (ref 5.0–8.0)

## 2016-08-27 LAB — PROTIME-INR
INR: 1.54
PROTHROMBIN TIME: 18.7 s — AB (ref 11.4–15.2)

## 2016-08-27 MED ORDER — ACETAMINOPHEN 500 MG PO TABS: 1000.0000 mg | ORAL_TABLET | Freq: Four times a day (QID) | ORAL | Status: DC | PRN

## 2016-08-27 MED ORDER — SODIUM POLYSTYRENE SULFONATE 15 GM/60ML PO SUSP: 30.0000 g | Freq: Once | ORAL | Status: DC

## 2016-08-27 MED ORDER — SODIUM CHLORIDE 0.9% FLUSH
3.0000 mL | Freq: Two times a day (BID) | INTRAVENOUS | Status: DC
  Administered 2016-08-27: 3 mL via INTRAVENOUS

## 2016-08-27 MED ORDER — WARFARIN - PHARMACIST DOSING INPATIENT: Freq: Every day | Status: DC

## 2016-08-27 MED ORDER — METOPROLOL TARTRATE 50 MG PO TABS
50.0000 mg | ORAL_TABLET | Freq: Two times a day (BID) | ORAL | Status: DC
  Administered 2016-08-27 – 2016-08-28 (×2): 50 mg via ORAL
  Filled 2016-08-27: qty 1
  Filled 2016-08-27: qty 2

## 2016-08-27 MED ORDER — WARFARIN SODIUM 3 MG PO TABS
3.0000 mg | ORAL_TABLET | Freq: Once | ORAL | Status: AC
  Administered 2016-08-27: 3 mg via ORAL
  Filled 2016-08-27: qty 1

## 2016-08-27 MED ORDER — PANTOPRAZOLE SODIUM 40 MG PO TBEC
40.0000 mg | DELAYED_RELEASE_TABLET | Freq: Every day | ORAL | Status: DC
  Administered 2016-08-28: 40 mg via ORAL
  Filled 2016-08-27: qty 1

## 2016-08-27 MED ORDER — SIMVASTATIN 20 MG PO TABS
20.0000 mg | ORAL_TABLET | Freq: Every day | ORAL | Status: DC
  Administered 2016-08-27: 20 mg via ORAL
  Filled 2016-08-27: qty 1

## 2016-08-27 MED ORDER — SODIUM CHLORIDE 0.9 % IV SOLN
INTRAVENOUS | Status: DC
  Administered 2016-08-27: 22:00:00 via INTRAVENOUS
  Administered 2016-08-28: 1000 mL via INTRAVENOUS

## 2016-08-27 MED ORDER — INSULIN ASPART 100 UNIT/ML ~~LOC~~ SOLN: 0.0000 [IU] | Freq: Three times a day (TID) | SUBCUTANEOUS | Status: DC

## 2016-08-27 MED ORDER — INSULIN ASPART 100 UNIT/ML ~~LOC~~ SOLN
10.0000 [IU] | Freq: Once | SUBCUTANEOUS | Status: DC
  Filled 2016-08-27: qty 1

## 2016-08-27 MED ORDER — INSULIN ASPART 100 UNIT/ML ~~LOC~~ SOLN
10.0000 [IU] | Freq: Once | SUBCUTANEOUS | Status: AC
  Administered 2016-08-27: 10 [IU] via INTRAVENOUS

## 2016-08-27 MED ORDER — DEXTROSE 50 % IV SOLN
1.0000 | Freq: Once | INTRAVENOUS | Status: AC
  Administered 2016-08-27: 50 mL via INTRAVENOUS
  Filled 2016-08-27: qty 50

## 2016-08-27 NOTE — ED Notes (Signed)
Pt reports being able to void independently x3 since being here.

## 2016-08-27 NOTE — Progress Notes (Signed)
ANTICOAGULATION CONSULT NOTE - Initial Consult  Pharmacy Consult for warfarin Indication: atrial fibrillation  No Known Allergies  Patient Measurements:   Heparin Dosing Weight:  Vital Signs: Temp: 98 F (36.7 C) (06/14 1848) Temp Source: Oral (06/14 1848) BP: 103/65 (06/14 1848) Pulse Rate: 78 (06/14 1848)  Labs:  Recent Labs  08/27/16 1851  HGB 11.7*  HCT 35.4*  PLT 192  LABPROT 18.7*  INR 1.54  CREATININE 3.27*    CrCl cannot be calculated (Unknown ideal weight.).   Medical History: Past Medical History:  Diagnosis Date  . Chronic anticoagulation   . Complication of anesthesia    ' hard time waking up" per patient   . Diabetes mellitus    type 2  . GERD (gastroesophageal reflux disease)   . Hyperlipidemia   . Hypertension   . Mitral regurgitation    a. Echo (09/2011):  EF 55%, mod MR, severe LAE, severe RAE, PASP 57-61, trivial eff  . Permanent atrial fibrillation (H[facility])    chronic atrial fib  . Pulmonary hypertension ([place])   . Pulmonary hypertension ([place])     Assessment: 70 YOM sent to ED by PCP for increased SCr.  He is on warfarin for history of afib.  Pharmacy asked to continue warfarin as inpatient. INR subtherapeutic at time of admission.   Home warfarin regimen: warfarin 2mg  daily (Last dose 6/13)  Today, 08/27/2016  INR subtherapeutic  CBC: Hgb decreased, pltc ENL  Goal of Therapy:  INR 2-3    Plan:   Warfarin 3mg  PO x 1 for subtherapeutic INR  Daily warfarin  Doreene Eland, PharmD, BCPS.   Pager: [phone] 08/27/2016 9:49 PM

## 2016-08-27 NOTE — ED Triage Notes (Signed)
Patient was seen at PCP today for stomach problems and swallowing problems. Patient was called and told to come to ED for repeat potassium and kidney function test due to ones earlier today were abnormal.

## 2016-08-27 NOTE — ED Provider Notes (Signed)
[facility] DEPT Provider Note   CSN: [id number] Arrival date & time: 08/27/16  1833     History   Chief Complaint Chief Complaint  Patient presents with  . sent to repeat lab work that was abnormal    HPI [name] is a 71 y.o. male.  71 y/o male her from his pcp's office with decreased renal function Was found to have elevated bun and creatinine after he went there today for body cramping and indigestion Denies chest pain/pressure No dypnea on exertion but some lle edema Pt does take an ace and diuretic Pt send here for confirmatory lab tests and admission      Past Medical History:  Diagnosis Date  . Chronic anticoagulation   . Complication of anesthesia    ' hard time waking up" per patient   . Diabetes mellitus    type 2  . GERD (gastroesophageal reflux disease)   . Hyperlipidemia   . Hypertension   . Mitral regurgitation    a. Echo (09/2011):  EF 55%, mod MR, severe LAE, severe RAE, PASP 57-61, trivial eff  . Permanent atrial fibrillation (HCC)    chronic atrial fib  . Pulmonary hypertension ([place])   . Pulmonary hypertension [facility])     Patient Active Problem List   Diagnosis Date Noted  . Mitral insufficiency 09/23/2011  . Hypertension   . Permanent atrial fibrillation ([place])   . Chronic anticoagulation   . Pulmonary hypertension ([place])   . Hyperlipidemia     Past Surgical History:  Procedure Laterality Date  . CARDIOVERSION    . CHOLECYSTECTOMY N/A 03/01/2015   Procedure: LAPAROSCOPIC CHOLECYSTECTOMY;  Surgeon: Ralene Ok, MD;  Location: [facility];  Service: General;  Laterality: N/A;  . spur on heel         Home Medications    Prior to Admission medications   Medication Sig Start Date End Date Taking? Authorizing Provider  acetaminophen (TYLENOL) 500 MG tablet Take 1,000 mg by mouth every 6 (six) hours as needed for mild pain.    [provider]  allopurinol (ZYLOPRIM) 100 MG tablet TAKE 1 TABLET BY MOUTH TWICE A DAY  06/08/16   Newt Minion, MD  colchicine 0.6 MG tablet Take 0.6 mg by mouth daily as needed (gout flare).  10/31/14   [provider]  colchicine 0.6 MG tablet Take 1 tablet (0.6 mg total) by mouth 2 (two) times daily. Take BID for acute gout attack, stop when acute pain resolves 01/28/16   Newt Minion, MD  Glucose Blood (FREESTYLE LITE TEST VI)  12/08/12   [provider]  hydrochlorothiazide (HYDRODIURIL) 25 MG tablet Take 25 mg by mouth daily. 11/02/14   [provider]  ibuprofen (ADVIL,MOTRIN) 200 MG tablet Take 400 mg by mouth every 6 (six) hours as needed for moderate pain.    [provider]  lisinopril (PRINIVIL,ZESTRIL) 40 MG tablet Take 40 mg by mouth daily.    [provider]  metFORMIN (GLUCOPHAGE) 1000 MG tablet Take 1 tablet by mouth 2 (two) times daily. 11/14/14   [provider]  metoprolol (LOPRESSOR) 50 MG tablet Take 50 mg by mouth 2 (two) times daily.  01/02/13   [provider]  omeprazole (PRILOSEC) 20 MG capsule Take 20 mg by mouth daily.  01/03/13   [provider]  simvastatin (ZOCOR) 20 MG tablet Take 20 mg by mouth at bedtime. 11/14/14   [provider]  traMADol (ULTRAM) 50 MG tablet Take 50 mg  by mouth every 6 (six) hours as needed.    [provider]  trolamine salicylate (ASPERCREME) 10 % cream Apply 1 application topically as needed for muscle pain.    [provider]  warfarin (COUMADIN) 3 MG tablet Take 3 mg by mouth daily.    [provider]    Family History Family History  Problem Relation Age of Onset  . Hypertension Mother   . Alzheimer's disease Mother   . Heart disease Father        cabgx2, respiratory failure, pacemaker    Social History Social History  Substance Use Topics  . Smoking status: Never Smoker  . Smokeless tobacco: Never Used  . Alcohol use Yes     Comment: margarita      Allergies   Patient has no known  allergies.   Review of Systems Review of Systems  All other systems reviewed and are negative.    Physical Exam Updated Vital Signs BP 103/65 (BP Location: Left Arm)   Pulse 78   Temp 98 F (36.7 C) (Oral)   Resp 20   SpO2 99%   Physical Exam  Constitutional: He is oriented to person, place, and time. He appears well-developed and well-nourished.  Non-toxic appearance. No distress.  HENT:  Head: Normocephalic and atraumatic.  Eyes: Conjunctivae, EOM and lids are normal. Pupils are equal, round, and reactive to light.  Neck: Normal range of motion. Neck supple. No tracheal deviation present. No thyroid mass present.  Cardiovascular: Normal rate, regular rhythm and normal heart sounds.  Exam reveals no gallop.   No murmur heard. Pulmonary/Chest: Effort normal and breath sounds normal. No stridor. No respiratory distress. He has no decreased breath sounds. He has no wheezes. He has no rhonchi. He has no rales.  Abdominal: Soft. Normal appearance and bowel sounds are normal. He exhibits no distension. There is no tenderness. There is no rebound and no CVA tenderness.  Musculoskeletal: Normal range of motion. He exhibits no edema or tenderness.  Neurological: He is alert and oriented to person, place, and time. He has normal strength. No cranial nerve deficit or sensory deficit. GCS eye subscore is 4. GCS verbal subscore is 5. GCS motor subscore is 6.  Skin: Skin is warm and dry. No abrasion and no rash noted.  Psychiatric: He has a normal mood and affect. His speech is normal and behavior is normal.  Nursing note and vitals reviewed.    ED Treatments / Results  Labs (all labs ordered are listed, but only abnormal results are displayed) Labs Reviewed  COMPREHENSIVE METABOLIC PANEL - Abnormal; Notable for the following:       Result Value   Potassium 5.9 (*)    Chloride 113 (*)    CO2 21 (*)    Glucose, Bld 132 (*)    BUN 80 (*)    Creatinine, Ser 3.27 (*)    ALT 14 (*)     GFR calc non Af Amer 18 (*)    GFR calc Af Amer 21 (*)    All other components within normal limits  CBC WITH DIFFERENTIAL/PLATELET - Abnormal; Notable for the following:    WBC 10.9 (*)    RBC 3.48 (*)    Hemoglobin 11.7 (*)    HCT 35.4 (*)    MCV 101.7 (*)    All other components within normal limits  PROTIME-INR    EKG  EKG Interpretation None       Radiology No results found.  Procedures  Procedures (including critical care time)  Medications Ordered in ED Medications  0.9 %  sodium chloride infusion (not administered)  sodium polystyrene (KAYEXALATE) 15 GM/60ML suspension 30 g (not administered)     Initial Impression / Assessment and Plan / ED Course  I have reviewed the triage vital signs and the nursing notes.  Pertinent labs & imaging results that were available during my care of the patient were reviewed by me and considered in my medical decision making (see chart for details).     Pt with evidence of renal failure w/ hyperkalemia likely from diuretic and ace inhibitor  use  Will tx with kaexylate and iv fluids Will also given insulin and glucose Will admit to hospitalist service  Final Clinical Impressions(s) / ED Diagnoses   Final diagnoses:  None    New Prescriptions New Prescriptions   No medications on file     Lacretia Leigh, MD 08/27/16 2125

## 2016-08-27 NOTE — H&P (Signed)
History and Physical    [name] PPI:[id number] DOB: 07-Dec-1945 DOA: 08/27/2016  PCP: Shirline Frees, MD  Patient coming from: Home  I have personally briefly reviewed patient's old medical records in [place]  Chief Complaint: Abnormal lab  HPI: [name] is a 71 y.o. male with medical history significant of DM2, HTN, coumadin for A.Fib, gout, pulm HTN.  Patient was seen by PCP today for stomach problems and dysphagia with solid foods.  Blood work was done at PCPs office today and came back showing Hyperkalemia and kidney failure which had developed / worsened significantly since Jan.  Jan kidney numbers are not known but wife thinks that today it was mentioned that kidney number "went from 2 to 3" since Jan (presumably creatinine which is 3.2 today in ED).  Patient does report that in Jan his HCTZ was changed to Lasix.  Also reports that he has been having "difficulty going to bathroom in the middle of the night" for the past 2 months or so.   ED Course: Creat 3.2, BUN 80, K 5.9.  I have reviewed his EKG, doesn't show any obvious findings of Hyperkalemia, but doesn't seem to be crossing over to the computer system in epic for some reason.   Review of Systems: As per HPI otherwise 10 point review of systems negative.   Past Medical History:  Diagnosis Date  . Chronic anticoagulation   . Complication of anesthesia    ' hard time waking up" per patient   . Diabetes mellitus    type 2  . GERD (gastroesophageal reflux disease)   . Hyperlipidemia   . Hypertension   . Mitral regurgitation    a. Echo (09/2011):  EF 55%, mod MR, severe LAE, severe RAE, PASP 57-61, trivial eff  . Permanent atrial fibrillation (HCC)    chronic atrial fib  . Pulmonary hypertension ([place])   . Pulmonary hypertension ([place])     Past Surgical History:  Procedure Laterality Date  . CARDIOVERSION    . CHOLECYSTECTOMY N/A 03/01/2015   Procedure: LAPAROSCOPIC CHOLECYSTECTOMY;  Surgeon:  Ralene Ok, MD;  Location: [facility];  Service: General;  Laterality: N/A;  . spur on heel       reports that he has never smoked. He has never used smokeless tobacco. He reports that he drinks alcohol. He reports that he does not use drugs.  No Known Allergies  Family History  Problem Relation Age of Onset  . Hypertension Mother   . Alzheimer's disease Mother   . Heart disease Father        cabgx2, respiratory failure, pacemaker     Prior to Admission medications   Medication Sig Start Date End Date Taking? Authorizing Provider  acetaminophen (TYLENOL) 500 MG tablet Take 1,000 mg by mouth every 6 (six) hours as needed for mild pain.    [provider]  allopurinol (ZYLOPRIM) 100 MG tablet TAKE 1 TABLET BY MOUTH TWICE A DAY 06/08/16   Newt Minion, MD  colchicine 0.6 MG tablet Take 0.6 mg by mouth daily as needed (gout flare).  10/31/14   [provider]  colchicine 0.6 MG tablet Take 1 tablet (0.6 mg total) by mouth 2 (two) times daily. Take BID for acute gout attack, stop when acute pain resolves 01/28/16   Newt Minion, MD  Glucose Blood (FREESTYLE LITE TEST VI)  12/08/12   [provider]  ibuprofen (ADVIL,MOTRIN) 200 MG tablet Take 400 mg by mouth every 6 (six)  hours as needed for moderate pain.    [provider]  lisinopril (PRINIVIL,ZESTRIL) 40 MG tablet Take 40 mg by mouth daily.    [provider]  metFORMIN (GLUCOPHAGE) 1000 MG tablet Take 1 tablet by mouth 2 (two) times daily. 11/14/14   [provider]  metoprolol (LOPRESSOR) 50 MG tablet Take 50 mg by mouth 2 (two) times daily.  01/02/13   [provider]  omeprazole (PRILOSEC) 20 MG capsule Take 20 mg by mouth daily.  01/03/13   [provider]  simvastatin (ZOCOR) 20 MG tablet Take 20 mg by mouth at bedtime. 11/14/14   [provider]  trolamine salicylate (ASPERCREME) 10 % cream Apply 1 application topically as needed for muscle pain.     [provider]  warfarin (COUMADIN) 3 MG tablet Take 3 mg by mouth daily.    [provider]    Physical Exam: Vitals:   08/27/16 1848  BP: 103/65  Pulse: 78  Resp: 20  Temp: 98 F (36.7 C)  TempSrc: Oral  SpO2: 99%    Constitutional: NAD, calm, comfortable Eyes: PERRL, lids and conjunctivae normal ENMT: Mucous membranes are moist. Posterior pharynx clear of any exudate or lesions.Normal dentition.  Neck: normal, supple, no masses, no thyromegaly Respiratory: clear to auscultation bilaterally, no wheezing, no crackles. Normal respiratory effort. No accessory muscle use.  Cardiovascular: Regular rate and rhythm, no murmurs / rubs / gallops. No extremity edema. 2+ pedal pulses. No carotid bruits.  Abdomen: no tenderness, no masses palpated. No hepatosplenomegaly. Bowel sounds positive.  Musculoskeletal: no clubbing / cyanosis. No joint deformity upper and lower extremities. Good ROM, no contractures. Normal muscle tone.  Skin: no rashes, lesions, ulcers. No induration Neurologic: CN 2-12 grossly intact. Sensation intact, DTR normal. Strength 5/5 in all 4.  Psychiatric: Normal judgment and insight. Alert and oriented x 3. Normal mood.    Labs on Admission: I have personally reviewed following labs and imaging studies  CBC:  Recent Labs Lab 08/27/16 1851  WBC 10.9*  NEUTROABS 7.7  HGB 11.7*  HCT 35.4*  MCV 101.7*  PLT 782   Basic Metabolic Panel:  Recent Labs Lab 08/27/16 1851  NA 141  K 5.9*  CL 113*  CO2 21*  GLUCOSE 132*  BUN 80*  CREATININE 3.27*  CALCIUM 9.3   GFR: CrCl cannot be calculated (Unknown ideal weight.). Liver Function Tests:  Recent Labs Lab 08/27/16 1851  AST 16  ALT 14*  ALKPHOS 49  BILITOT 0.6  PROT 7.2  ALBUMIN 4.2   No results for input(s): LIPASE, AMYLASE in the last 168 hours. No results for input(s): AMMONIA in the last 168 hours. Coagulation Profile:  Recent Labs Lab 08/27/16 1851  INR 1.54    Cardiac Enzymes: No results for input(s): CKTOTAL, CKMB, CKMBINDEX, TROPONINI in the last 168 hours. BNP (last 3 results) No results for input(s): PROBNP in the last 8760 hours. HbA1C: No results for input(s): HGBA1C in the last 72 hours. CBG: No results for input(s): GLUCAP in the last 168 hours. Lipid Profile: No results for input(s): CHOL, HDL, LDLCALC, TRIG, CHOLHDL, LDLDIRECT in the last 72 hours. Thyroid Function Tests: No results for input(s): TSH, T4TOTAL, FREET4, T3FREE, THYROIDAB in the last 72 hours. Anemia Panel: No results for input(s): VITAMINB12, FOLATE, FERRITIN, TIBC, IRON, RETICCTPCT in the last 72 hours. Urine analysis: No results found for: COLORURINE, APPEARANCEUR, LABSPEC, PHURINE, GLUCOSEU, HGBUR, BILIRUBINUR, KETONESUR, PROTEINUR, UROBILINOGEN, NITRITE, LEUKOCYTESUR  Radiological Exams on Admission: No results found.  EKG: Independently reviewed.  Assessment/Plan Principal Problem:   Acute kidney injury superimposed on CKD [facility]) Active Problems:   Hypertension   Permanent atrial fibrillation (HCC)   Hyperkalemia, diminished renal excretion   DM2 (diabetes mellitus, type 2) (HCC)   Dysphagia   Gout    1. AKI on CKD - 1. Sending for [place] records to confirm baseline, but sounds like Creat may have been 2 in Jan. 2. Repeat BMP in AM 3. IVF: NS at 125 4. Holding lisinopril / lasix 5. Holding NSAIDs, though it doesn't sound like he was taking these frequently anyhow 6. Bladder scan, foley if urinary retention found 7. Renal US 8. Urine lytes 9. If no immediate cause / improvement, then likely warrants nephrology consult in AM 2. Hyperkalemia - 1. Tele monitor 2. Repeat K at 0200 and BMP in AM 3. Dysphagia - "difficutly with solids" per patient 1. Possibly related to Hyperkalemia / Uremia 2. Just got started on PPI today (took first dose this afternoon), will continue this 3. If persists after correction of above, then likely will need GI eval  to further investigate cause 4. Gout - 1. Hold allopurinol due to AKI 2. Given known gout, also suspect baseline CKD. 5. DM2 - 1. Holding metformin 2. Sensitive SSI AC 6. HTN - 1. Continue metoprolol 2. Holding lasix and lisinopril 7. A.Fib - 1. Continue coumadin 2. Continue metoprolol for rate control  DVT prophylaxis: Coumadin for A.Fib Code Status: Full Family Communication: Wife at bedside Disposition Plan: Home after admit Consults called: None Admission status: Admit to inpatient - inpatient due to AKI with hyperkalemia   Etta Quill DO [facility] Pager [phone]  If 7AM-7PM, please contact day team taking care of patient [email or web address] Password [facility]  08/27/2016, 10:03 PM

## 2016-08-28 DIAGNOSIS — N179 Acute kidney failure, unspecified: Principal | ICD-10-CM

## 2016-08-28 DIAGNOSIS — E875 Hyperkalemia: Secondary | ICD-10-CM | POA: Diagnosis not present

## 2016-08-28 DIAGNOSIS — N189 Chronic kidney disease, unspecified: Secondary | ICD-10-CM | POA: Diagnosis not present

## 2016-08-28 DIAGNOSIS — E1122 Type 2 diabetes mellitus with diabetic chronic kidney disease: Secondary | ICD-10-CM | POA: Diagnosis not present

## 2016-08-28 DIAGNOSIS — I272 Pulmonary hypertension, unspecified: Secondary | ICD-10-CM | POA: Diagnosis not present

## 2016-08-28 DIAGNOSIS — Z6827 Body mass index (BMI) 27.0-27.9, adult: Secondary | ICD-10-CM | POA: Diagnosis not present

## 2016-08-28 DIAGNOSIS — E785 Hyperlipidemia, unspecified: Secondary | ICD-10-CM | POA: Diagnosis not present

## 2016-08-28 LAB — BASIC METABOLIC PANEL
Anion gap: 5 (ref 5–15)
BUN: 65 mg/dL — AB (ref 6–20)
CO2: 21 mmol/L — ABNORMAL LOW (ref 22–32)
CREATININE: 2.63 mg/dL — AB (ref 0.61–1.24)
Calcium: 9 mg/dL (ref 8.9–10.3)
Chloride: 115 mmol/L — ABNORMAL HIGH (ref 101–111)
GFR calc non Af Amer: 23 mL/min — ABNORMAL LOW (ref >60)
GFR, EST AFRICAN AMERICAN: 27 mL/min — AB (ref >60)
Glucose, Bld: 178 mg/dL — ABNORMAL HIGH (ref 65–99)
POTASSIUM: 5.6 mmol/L — AB (ref 3.5–5.1)
SODIUM: 141 mmol/L (ref 135–145)

## 2016-08-28 LAB — CBC
HEMATOCRIT: 32.1 % — AB (ref 39.0–52.0)
HEMOGLOBIN: 10.4 g/dL — AB (ref 13.0–17.0)
MCH: 33 pg (ref 26.0–34.0)
MCHC: 32.4 g/dL (ref 30.0–36.0)
MCV: 101.9 fL — ABNORMAL HIGH (ref 78.0–100.0)
Platelets: 170 10*3/uL (ref 150–400)
RBC: 3.15 MIL/uL — ABNORMAL LOW (ref 4.22–5.81)
RDW: 14.6 % (ref 11.5–15.5)
WBC: 9.6 10*3/uL (ref 4.0–10.5)

## 2016-08-28 LAB — NA AND K (SODIUM & POTASSIUM), RAND UR
POTASSIUM UR: 28 mmol/L
Sodium, Ur: 81 mmol/L

## 2016-08-28 LAB — GLUCOSE, CAPILLARY
GLUCOSE-CAPILLARY: 98 mg/dL (ref 65–99)
GLUCOSE-CAPILLARY: 145 mg/dL — AB (ref 65–99)

## 2016-08-28 LAB — CREATININE, URINE, RANDOM: CREATININE, URINE: 90.81 mg/dL

## 2016-08-28 MED ORDER — SODIUM CHLORIDE 0.9 % IV BOLUS (SEPSIS)
500.0000 mL | Freq: Once | INTRAVENOUS | Status: AC
  Administered 2016-08-28: 500 mL via INTRAVENOUS

## 2016-08-28 MED ORDER — SODIUM POLYSTYRENE SULFONATE 15 GM/60ML PO SUSP
30.0000 g | Freq: Once | ORAL | Status: AC
  Administered 2016-08-28: 30 g via ORAL
  Filled 2016-08-28: qty 120

## 2016-08-28 MED ORDER — WARFARIN SODIUM 3 MG PO TABS
3.0000 mg | ORAL_TABLET | Freq: Once | ORAL | Status: DC
  Filled 2016-08-28: qty 1

## 2016-08-28 NOTE — Progress Notes (Signed)
ANTICOAGULATION CONSULT NOTE   Pharmacy Consult for warfarin Indication: atrial fibrillation  No Known Allergies  Patient Measurements: Height: 6\' 3"  (190.5 cm) Weight: 218 lb 3.2 oz (99 kg) IBW/kg (Calculated) : 84.5 Heparin Dosing Weight:  Vital Signs: Temp: 98.1 F (36.7 C) (06/15 0528) Temp Source: Oral (06/15 0528) BP: 97/56 (06/15 0528) Pulse Rate: 74 (06/15 0528)  Labs:  Recent Labs  08/27/16 1851 08/28/16 0659  HGB 11.7* 10.4*  HCT 35.4* 32.1*  PLT 192 170  LABPROT 18.7*  --   INR 1.54  --   CREATININE 3.27*  --     Estimated Creatinine Clearance: 25.5 mL/min (A) (by C-G formula based on SCr of 3.27 mg/dL (H)).   Medical History: Past Medical History:  Diagnosis Date  . Atrial fibrillation ([place])   . Chronic anticoagulation   . Complication of anesthesia    ' hard time waking up" per patient   . Diabetes mellitus    type 2  . GERD (gastroesophageal reflux disease)   . Hyperlipidemia   . Hypertension   . Mitral regurgitation    a. Echo (09/2011):  EF 55%, mod MR, severe LAE, severe RAE, PASP 57-61, trivial eff  . Permanent atrial fibrillation (HCC)    chronic atrial fib  . Pulmonary hypertension ([place])   . Pulmonary hypertension ([place])     Assessment: 70 YOM sent to ED by PCP for increased SCr.  He is on warfarin for history of afib.  Pharmacy asked to continue warfarin as inpatient. INR subtherapeutic at time of admission.   Home warfarin regimen: warfarin 2mg  daily (Last dose 6/13)  Today, 6/15:  INR subtherapeutic  CBC: Hgb stable, pltc WNL  Goal of Therapy:  INR 2-3    Plan:  1. Warfarin 3mg  PO x 1 for subtherapeutic INR 2. Daily INR  Vincenza Hews, PharmD, BCPS 08/28/2016, 8:38 AM

## 2016-08-28 NOTE — Discharge Instructions (Signed)
Follow with Primary MD Shirline Frees, MD in 3 days   Get CBC, CMP, INR, checked  by Primary MD in 3 days ( we routinely change or add medications that can affect your baseline labs and fluid status, therefore we recommend that you get the mentioned basic workup next visit with your PCP, your PCP may decide not to get them or add new tests based on their clinical decision)  Activity: As tolerated with Full fall precautions use walker/cane & assistance as needed  Disposition Home    Diet:   Diet heart healthy/carb modified .  For Heart failure patients - Check your Weight same time everyday, if you gain over 2 pounds, or you develop in leg swelling, experience more shortness of breath or chest pain, call your Primary MD immediately. Follow Cardiac Low Salt Diet and 1.5 lit/day fluid restriction.  On your next visit with your primary care physician please Get Medicines reviewed and adjusted.  Please request your Prim.MD to go over all Hospital Tests and Procedure/Radiological results at the follow up, please get all Hospital records sent to your Prim MD by signing hospital release before you go home.  If you experience worsening of your admission symptoms, develop shortness of breath, life threatening emergency, suicidal or homicidal thoughts you must seek medical attention immediately by calling 911 or calling your MD immediately  if symptoms less severe.  You Must read complete instructions/literature along with all the possible adverse reactions/side effects for all the Medicines you take and that have been prescribed to you. Take any new Medicines after you have completely understood and accpet all the possible adverse reactions/side effects.   Do not drive, operate heavy machinery, perform activities at heights, swimming or participation in water activities or provide baby sitting services if your were admitted for syncope or siezures until you have seen by Primary MD or a Neurologist and  advised to do so again.  Do not drive when taking Pain medications.    Do not take more than prescribed Pain, Sleep and Anxiety Medications  Special Instructions: If you have smoked or chewed Tobacco  in the last 2 yrs please stop smoking, stop any regular Alcohol  and or any Recreational drug use.  Wear Seat belts while driving.   Please note  You were cared for by a hospitalist during your hospital stay. If you have any questions about your discharge medications or the care you received while you were in the hospital after you are discharged, you can call the unit and asked to speak with the hospitalist on call if the hospitalist that took care of you is not available. Once you are discharged, your primary care physician will handle any further medical issues. Please note that NO REFILLS for any discharge medications will be authorized once you are discharged, as it is imperative that you return to your primary care physician (or establish a relationship with a primary care physician if you do not have one) for your aftercare needs so that they can reassess your need for medications and monitor your lab values.        Information on my medicine - Coumadin   (Warfarin)  Why was Coumadin prescribed for you? Coumadin was prescribed for you because you have a blood clot or a medical condition that can cause an increased risk of forming blood clots. Blood clots can cause serious health problems by blocking the flow of blood to the heart, lung, or brain. Coumadin can prevent harmful blood  clots from forming. As a reminder your indication for Coumadin is:   Stroke Prevention Because Of Atrial Fibrillation  What test will check on my response to Coumadin? While on Coumadin (warfarin) you will need to have an INR test regularly to ensure that your dose is keeping you in the desired range. The INR (international normalized ratio) number is calculated from the result of the laboratory test  called prothrombin time (PT).  If an INR APPOINTMENT HAS NOT ALREADY BEEN MADE FOR YOU please schedule an appointment to have this lab work done by your health care provider within 7 days. Your INR goal is usually a number between:  2 to 3 or your provider may give you a more narrow range like 2-2.5.  Ask your health care provider during an office visit what your goal INR is.  What  do you need to  know  About  COUMADIN? Take Coumadin (warfarin) exactly as prescribed by your healthcare provider about the same time each day.  DO NOT stop taking without talking to the doctor who prescribed the medication.  Stopping without other blood clot prevention medication to take the place of Coumadin may increase your risk of developing a new clot or stroke.  Get refills before you run out.  What do you do if you miss a dose? If you miss a dose, take it as soon as you remember on the same day then continue your regularly scheduled regimen the next day.  Do not take two doses of Coumadin at the same time.  Important Safety Information A possible side effect of Coumadin (Warfarin) is an increased risk of bleeding. You should call your healthcare provider right away if you experience any of the following: ? Bleeding from an injury or your nose that does not stop. ? Unusual colored urine (red or dark brown) or unusual colored stools (red or black). ? Unusual bruising for unknown reasons. ? A serious fall or if you hit your head (even if there is no bleeding).  Some foods or medicines interact with Coumadin (warfarin) and might alter your response to warfarin. To help avoid this: ? Eat a balanced diet, maintaining a consistent amount of Vitamin K. ? Notify your provider about major diet changes you plan to make. ? Avoid alcohol or limit your intake to 1 drink for women and 2 drinks for men per day. (1 drink is 5 oz. wine, 12 oz. beer, or 1.5 oz. liquor.)  Make sure that ANY health care provider who  prescribes medication for you knows that you are taking Coumadin (warfarin).  Also make sure the healthcare provider who is monitoring your Coumadin knows when you have started a new medication including herbals and non-prescription products.  Coumadin (Warfarin)  Major Drug Interactions  Increased Warfarin Effect Decreased Warfarin Effect  Alcohol (large quantities) Antibiotics (esp. Septra/Bactrim, Flagyl, Cipro) Amiodarone (Cordarone) Aspirin (ASA) Cimetidine (Tagamet) Megestrol (Megace) NSAIDs (ibuprofen, naproxen, etc.) Piroxicam (Feldene) Propafenone (Rythmol SR) Propranolol (Inderal) Isoniazid (INH) Posaconazole (Noxafil) Barbiturates (Phenobarbital) Carbamazepine (Tegretol) Chlordiazepoxide (Librium) Cholestyramine (Questran) Griseofulvin Oral Contraceptives Rifampin Sucralfate (Carafate) Vitamin K   Coumadin (Warfarin) Major Herbal Interactions  Increased Warfarin Effect Decreased Warfarin Effect  Garlic Ginseng Ginkgo biloba Coenzyme Q10 Green tea St. Johns wort    Coumadin (Warfarin) FOOD Interactions  Eat a consistent number of servings per week of foods HIGH in Vitamin K (1 serving =  cup)  Collards (cooked, or boiled & drained) Kale (cooked, or boiled & drained) Mustard greens (cooked, or  boiled & drained) Parsley *serving size only =  cup Spinach (cooked, or boiled & drained) Swiss chard (cooked, or boiled & drained) Turnip greens (cooked, or boiled & drained)  Eat a consistent number of servings per week of foods MEDIUM-HIGH in Vitamin K (1 serving = 1 cup)  Asparagus (cooked, or boiled & drained) Broccoli (cooked, boiled & drained, or raw & chopped) Brussel sprouts (cooked, or boiled & drained) *serving size only =  cup Lettuce, raw (green leaf, endive, romaine) Spinach, raw Turnip greens, raw & chopped   These websites have more information on Coumadin (warfarin):  FailFactory.se; VeganReport.com.au;

## 2016-08-28 NOTE — Discharge Summary (Signed)
[name] OVP:[id number] DOB: [birth year] DOA: 08/27/2016  PCP: Shirline Frees, MD  Admit date: 08/27/2016  Discharge date: 08/28/2016  Admitted From: Home   Disposition:  Home   Recommendations for Outpatient Follow-up:   Follow up with PCP in 1-2 weeks  PCP Please obtain BMP/CBC, 2 view CXR in 1week,  (see Discharge instructions)   PCP Please follow up on the following pending results: Monitor BMP and blood pressure in 3-4 days, arrange for outpatient renal follow-up, outpatient age-appropriate workup for unintentional 20 pound weight loss over the last 6 months.   Home Health: None  Equipment/Devices: None  Consultations: None Discharge Condition: Stable   CODE STATUS: Full   Diet Recommendation: Diet heart healthy/carb modified    Chief Complaint  Patient presents with  . sent to repeat lab work that was abnormal     Brief history of present illness from the day of admission and additional interim summary    [name] is a 71 y.o. male with medical history significant of DM2, HTN, coumadin for A.Fib, gout, pulm HTN, per patient he went to PCP office with weight complaints of tiredness and fatigue, their workup suggested hyperkalemia and he was sent to the ER for admission. He also claims that he has lost about 20 pounds of weight over the last 4-6 months unintentionally, no other subjective complaints.                                                                 Hospital Course    1. ARF once daily 3 with hyperkalemia. Last creatinine is 1.7 from 1859, he certainly could have progressed his underlying chronic kidney disease, he was also on NSAIDs along with ACE inhibitor and Lasix. With hydration her renal function and hyperkalemia have both improved, will give him another 500 mL normal  saline bolus along with a dose of Kayexalate. Stop offending medications. We'll discharge him back home with PCP follow up in 3 days for repeat BMP. Request PCP to arrange for outpatient renal follow-up in 1-2 weeks. Renal ultrasound here was unremarkable.  2. History of gout. On allopurinol which can be continued now. Continue to monitor renal function.  3. Unintentional 20 pound weight loss. This morning he does not confirm any dysphagia. He says his appetite overall is stable. However will request PCP to do age-appropriate outpatient weight loss workup.  4. DM type II. Diet control and Glucophage continued, monitor bicarbonate levels .  5. Hypertension. Continue beta blocker at this time only.  6. Paroxysmal atrial fibrillation. On beta blocker and Coumadin. PCP to monitor INR.  Discharge diagnosis     Principal Problem:   Acute kidney injury superimposed on CKD [facility]) Active Problems:   Hypertension   Permanent atrial fibrillation (HCC)   Hyperkalemia, diminished renal  excretion   DM2 (diabetes mellitus, type 2) ([place])   Dysphagia   Gout    Discharge instructions    Discharge Instructions    Discharge instructions    Complete by:  As directed    Follow with Primary MD Shirline Frees, MD in 3 days   Get CBC, CMP, INR, checked  by Primary MD in 3 days ( we routinely change or add medications that can affect your baseline labs and fluid status, therefore we recommend that you get the mentioned basic workup next visit with your PCP, your PCP may decide not to get them or add new tests based on their clinical decision)  Activity: As tolerated with Full fall precautions use walker/cane & assistance as needed  Disposition Home    Diet:   Diet heart healthy/carb modified .  For Heart failure patients - Check your Weight same time everyday, if you gain over 2 pounds, or you develop in leg swelling, experience more shortness of breath or chest pain, call your Primary MD  immediately. Follow Cardiac Low Salt Diet and 1.5 lit/day fluid restriction.  On your next visit with your primary care physician please Get Medicines reviewed and adjusted.  Please request your Prim.MD to go over all Hospital Tests and Procedure/Radiological results at the follow up, please get all Hospital records sent to your Prim MD by signing hospital release before you go home.  If you experience worsening of your admission symptoms, develop shortness of breath, life threatening emergency, suicidal or homicidal thoughts you must seek medical attention immediately by calling 911 or calling your MD immediately  if symptoms less severe.  You Must read complete instructions/literature along with all the possible adverse reactions/side effects for all the Medicines you take and that have been prescribed to you. Take any new Medicines after you have completely understood and accpet all the possible adverse reactions/side effects.   Do not drive, operate heavy machinery, perform activities at heights, swimming or participation in water activities or provide baby sitting services if your were admitted for syncope or siezures until you have seen by Primary MD or a Neurologist and advised to do so again.  Do not drive when taking Pain medications.    Do not take more than prescribed Pain, Sleep and Anxiety Medications  Special Instructions: If you have smoked or chewed Tobacco  in the last 2 yrs please stop smoking, stop any regular Alcohol  and or any Recreational drug use.  Wear Seat belts while driving.   Please note  You were cared for by a hospitalist during your hospital stay. If you have any questions about your discharge medications or the care you received while you were in the hospital after you are discharged, you can call the unit and asked to speak with the hospitalist on call if the hospitalist that took care of you is not available. Once you are discharged, your primary care  physician will handle any further medical issues. Please note that NO REFILLS for any discharge medications will be authorized once you are discharged, as it is imperative that you return to your primary care physician (or establish a relationship with a primary care physician if you do not have one) for your aftercare needs so that they can reassess your need for medications and monitor your lab values.   Increase activity slowly    Complete by:  As directed       Discharge Medications   Allergies as of 08/28/2016   No  Known Allergies     Medication List    STOP taking these medications   furosemide 20 MG tablet Commonly known as:  LASIX   ibuprofen 200 MG tablet Commonly known as:  ADVIL,MOTRIN   lisinopril 40 MG tablet Commonly known as:  PRINIVIL,ZESTRIL     TAKE these medications   acetaminophen 500 MG tablet Commonly known as:  TYLENOL Take 1,000 mg by mouth every 6 (six) hours as needed for mild pain.   allopurinol 100 MG tablet Commonly known as:  ZYLOPRIM TAKE 1 TABLET BY MOUTH TWICE A DAY What changed:  See the new instructions.   colchicine 0.6 MG tablet Take 1 tablet (0.6 mg total) by mouth 2 (two) times daily. Take BID for acute gout attack, stop when acute pain resolves   FREESTYLE LITE TEST VI   metFORMIN 1000 MG tablet Commonly known as:  GLUCOPHAGE Take 1 tablet by mouth 2 (two) times daily.   metoprolol tartrate 50 MG tablet Commonly known as:  LOPRESSOR Take 50 mg by mouth 2 (two) times daily.   omeprazole 20 MG capsule Commonly known as:  PRILOSEC Take 20 mg by mouth daily.   simvastatin 20 MG tablet Commonly known as:  ZOCOR Take 20 mg by mouth at bedtime.   trolamine salicylate 10 % cream Commonly known as:  ASPERCREME Apply 1 application topically as needed for muscle pain.   warfarin 2 MG tablet Commonly known as:  COUMADIN Take 2 mg by mouth every evening.       Follow-up Information    Shirline Frees, MD. Schedule an  appointment as soon as possible for a visit in 3 day(s).   Specialty:  Family Medicine Contact information: [address] [place] [address] [phone]        Elmarie Shiley, MD. Schedule an appointment as soon as possible for a visit in 1 week(s).   Specialty:  Nephrology Why:  CKD 3 Contact information: [place] [address] [phone]           Major procedures and Radiology Reports - PLEASE review detailed and final reports thoroughly  -         US Renal  Result Date: 08/27/2016 CLINICAL DATA:  Acute onset of renal insufficiency. Initial encounter. EXAM: RENAL / URINARY TRACT ULTRASOUND COMPLETE COMPARISON:  Abdominal ultrasound performed 10/25/2014 FINDINGS: Right Kidney: Length: 10.7 cm. Echogenicity within normal limits. No mass or hydronephrosis visualized. Left Kidney: Length: 10.2 cm. Echogenicity within normal limits. No mass or hydronephrosis visualized. Bladder: Appears normal for degree of bladder distention. IMPRESSION: Unremarkable renal ultrasound.  No evidence of hydronephrosis. Electronically Signed   By: Garald Balding M.D.   On: 08/27/2016 23:09    Micro Results     No results found for this or any previous visit (from the past 240 hour(s)).  Today   Subjective    Kaleel Schmieder today has no headache,no chest abdominal pain,no new weakness tingling or numbness, feels much better wants to go home today.    Objective   Blood pressure 107/67, pulse 74, temperature 98.2 F (36.8 C), temperature source Oral, resp. rate 18, height 6\' 3"  (1.905 m), weight 99 kg (218 lb 3.2 oz), SpO2 98 %.   Intake/Output Summary (Last 24 hours) at 08/28/16 1105 Last data filed at 08/28/16 0900  Gross per 24 hour  Intake          1471.67 ml  Output  575 ml  Net           896.67 ml    Exam Awake Alert, Oriented x 3, No new F.N deficits, Normal affect [place].AT,PERRAL Supple Neck,No JVD, No cervical lymphadenopathy  appriciated.  Symmetrical Chest wall movement, Good air movement bilaterally, CTAB RRR,No Gallops,Rubs or new Murmurs, No Parasternal Heave +ve B.Sounds, Abd Soft, Non tender, No organomegaly appriciated, No rebound -guarding or rigidity. No Cyanosis, Clubbing or edema, No new Rash or bruise   Data Review   CBC w Diff: Lab Results  Component Value Date   WBC 9.6 08/28/2016   HGB 10.4 (L) 08/28/2016   HCT 32.1 (L) 08/28/2016   PLT 170 08/28/2016   LYMPHOPCT 18 08/27/2016   MONOPCT 8 08/27/2016   EOSPCT 3 08/27/2016   BASOPCT 0 08/27/2016    CMP: Lab Results  Component Value Date   NA 141 08/28/2016   K 5.6 (H) 08/28/2016   CL 115 (H) 08/28/2016   CO2 21 (L) 08/28/2016   BUN 65 (H) 08/28/2016   CREATININE 2.63 (H) 08/28/2016   PROT 7.2 08/27/2016   ALBUMIN 4.2 08/27/2016   BILITOT 0.6 08/27/2016   ALKPHOS 49 08/27/2016   AST 16 08/27/2016   ALT 14 (L) 08/27/2016  . Lab Results  Component Value Date   INR 1.54 08/27/2016   INR 1.23 03/01/2015   INR 3.17 (H) 02/22/2015     Total Time in preparing paper work, data evaluation and todays exam - 16 minutes  Lala Lund M.D on 08/28/2016 at 11:05 AM  [facility]   Office  [phone]

## 2016-08-28 NOTE — Progress Notes (Signed)
Discharge instructions and medications dicussed with patient and wife.  All questions answered.

## 2016-09-01 DIAGNOSIS — Z7984 Long term (current) use of oral hypoglycemic drugs: Secondary | ICD-10-CM | POA: Diagnosis not present

## 2016-09-01 DIAGNOSIS — E782 Mixed hyperlipidemia: Secondary | ICD-10-CM | POA: Diagnosis not present

## 2016-09-01 DIAGNOSIS — K219 Gastro-esophageal reflux disease without esophagitis: Secondary | ICD-10-CM | POA: Diagnosis not present

## 2016-09-01 DIAGNOSIS — N184 Chronic kidney disease, stage 4 (severe): Secondary | ICD-10-CM | POA: Diagnosis not present

## 2016-09-01 DIAGNOSIS — R600 Localized edema: Secondary | ICD-10-CM | POA: Diagnosis not present

## 2016-09-01 DIAGNOSIS — I4891 Unspecified atrial fibrillation: Secondary | ICD-10-CM | POA: Diagnosis not present

## 2016-09-01 DIAGNOSIS — I10 Essential (primary) hypertension: Secondary | ICD-10-CM | POA: Diagnosis not present

## 2016-09-01 DIAGNOSIS — M109 Gout, unspecified: Secondary | ICD-10-CM | POA: Diagnosis not present

## 2016-09-01 DIAGNOSIS — E119 Type 2 diabetes mellitus without complications: Secondary | ICD-10-CM | POA: Diagnosis not present

## 2016-09-04 DIAGNOSIS — D519 Vitamin B12 deficiency anemia, unspecified: Secondary | ICD-10-CM | POA: Diagnosis not present

## 2016-09-04 DIAGNOSIS — N184 Chronic kidney disease, stage 4 (severe): Secondary | ICD-10-CM | POA: Diagnosis not present

## 2016-09-04 DIAGNOSIS — D631 Anemia in chronic kidney disease: Secondary | ICD-10-CM | POA: Diagnosis not present

## 2016-09-04 DIAGNOSIS — I129 Hypertensive chronic kidney disease with stage 1 through stage 4 chronic kidney disease, or unspecified chronic kidney disease: Secondary | ICD-10-CM | POA: Diagnosis not present

## 2016-09-04 DIAGNOSIS — N183 Chronic kidney disease, stage 3 (moderate): Secondary | ICD-10-CM | POA: Diagnosis not present

## 2016-09-04 DIAGNOSIS — N2581 Secondary hyperparathyroidism of renal origin: Secondary | ICD-10-CM | POA: Diagnosis not present

## 2016-09-14 DIAGNOSIS — Z7901 Long term (current) use of anticoagulants: Secondary | ICD-10-CM | POA: Diagnosis not present

## 2016-09-14 DIAGNOSIS — I4891 Unspecified atrial fibrillation: Secondary | ICD-10-CM | POA: Diagnosis not present

## 2016-09-24 DIAGNOSIS — Z1389 Encounter for screening for other disorder: Secondary | ICD-10-CM | POA: Diagnosis not present

## 2016-09-24 DIAGNOSIS — E782 Mixed hyperlipidemia: Secondary | ICD-10-CM | POA: Diagnosis not present

## 2016-09-24 DIAGNOSIS — E119 Type 2 diabetes mellitus without complications: Secondary | ICD-10-CM | POA: Diagnosis not present

## 2016-09-24 DIAGNOSIS — I10 Essential (primary) hypertension: Secondary | ICD-10-CM | POA: Diagnosis not present

## 2016-09-24 DIAGNOSIS — M109 Gout, unspecified: Secondary | ICD-10-CM | POA: Diagnosis not present

## 2016-09-24 DIAGNOSIS — R600 Localized edema: Secondary | ICD-10-CM | POA: Diagnosis not present

## 2016-09-24 DIAGNOSIS — N184 Chronic kidney disease, stage 4 (severe): Secondary | ICD-10-CM | POA: Diagnosis not present

## 2016-10-12 DIAGNOSIS — I4891 Unspecified atrial fibrillation: Secondary | ICD-10-CM | POA: Diagnosis not present

## 2016-10-12 DIAGNOSIS — Z7901 Long term (current) use of anticoagulants: Secondary | ICD-10-CM | POA: Diagnosis not present

## 2016-10-29 DIAGNOSIS — N183 Chronic kidney disease, stage 3 (moderate): Secondary | ICD-10-CM | POA: Diagnosis not present

## 2016-11-05 DIAGNOSIS — I129 Hypertensive chronic kidney disease with stage 1 through stage 4 chronic kidney disease, or unspecified chronic kidney disease: Secondary | ICD-10-CM | POA: Diagnosis not present

## 2016-11-05 DIAGNOSIS — N2581 Secondary hyperparathyroidism of renal origin: Secondary | ICD-10-CM | POA: Diagnosis not present

## 2016-11-05 DIAGNOSIS — N183 Chronic kidney disease, stage 3 (moderate): Secondary | ICD-10-CM | POA: Diagnosis not present

## 2016-11-05 DIAGNOSIS — D631 Anemia in chronic kidney disease: Secondary | ICD-10-CM | POA: Diagnosis not present

## 2016-11-13 DIAGNOSIS — Z7901 Long term (current) use of anticoagulants: Secondary | ICD-10-CM | POA: Diagnosis not present

## 2016-11-13 DIAGNOSIS — I4891 Unspecified atrial fibrillation: Secondary | ICD-10-CM | POA: Diagnosis not present

## 2016-12-01 ENCOUNTER — Other Ambulatory Visit (INDEPENDENT_AMBULATORY_CARE_PROVIDER_SITE_OTHER): Payer: Self-pay | Admitting: Orthopedic Surgery

## 2016-12-07 DIAGNOSIS — I4891 Unspecified atrial fibrillation: Secondary | ICD-10-CM | POA: Diagnosis not present

## 2016-12-07 DIAGNOSIS — Z7901 Long term (current) use of anticoagulants: Secondary | ICD-10-CM | POA: Diagnosis not present

## 2017-01-04 DIAGNOSIS — M25462 Effusion, left knee: Secondary | ICD-10-CM | POA: Diagnosis not present

## 2017-01-04 DIAGNOSIS — Z7901 Long term (current) use of anticoagulants: Secondary | ICD-10-CM | POA: Diagnosis not present

## 2017-01-04 DIAGNOSIS — Z23 Encounter for immunization: Secondary | ICD-10-CM | POA: Diagnosis not present

## 2017-01-05 ENCOUNTER — Other Ambulatory Visit: Payer: Self-pay | Admitting: Family Medicine

## 2017-01-05 ENCOUNTER — Ambulatory Visit
Admission: RE | Admit: 2017-01-05 | Discharge: 2017-01-05 | Disposition: A | Discharge status: Home / Self Care | Payer: Medicare Other | Source: Ambulatory Visit | Attending: Family Medicine | Admitting: Family Medicine

## 2017-01-05 DIAGNOSIS — M25562 Pain in left knee: Secondary | ICD-10-CM | POA: Diagnosis not present

## 2017-01-05 DIAGNOSIS — M25462 Effusion, left knee: Secondary | ICD-10-CM

## 2017-01-05 DIAGNOSIS — S8992XA Unspecified injury of left lower leg, initial encounter: Secondary | ICD-10-CM | POA: Diagnosis not present

## 2017-01-06 ENCOUNTER — Ambulatory Visit (INDEPENDENT_AMBULATORY_CARE_PROVIDER_SITE_OTHER): Payer: Medicare Other

## 2017-01-06 ENCOUNTER — Ambulatory Visit (INDEPENDENT_AMBULATORY_CARE_PROVIDER_SITE_OTHER): Payer: Worker's Compensation | Admitting: Orthopedic Surgery

## 2017-01-06 ENCOUNTER — Encounter (INDEPENDENT_AMBULATORY_CARE_PROVIDER_SITE_OTHER): Payer: Self-pay | Admitting: Orthopedic Surgery

## 2017-01-06 DIAGNOSIS — M25562 Pain in left knee: Secondary | ICD-10-CM

## 2017-01-06 DIAGNOSIS — S8002XA Contusion of left knee, initial encounter: Secondary | ICD-10-CM

## 2017-01-06 MED ORDER — LIDOCAINE HCL 1 % IJ SOLN
1.0000 mL | INTRAMUSCULAR | Status: AC | PRN
  Administered 2017-01-06: 1 mL

## 2017-01-06 NOTE — Progress Notes (Signed)
Office Visit Note   [name]           Date of Birth: 1945-08-22           MRN: [id number] Visit Date: 01/06/2017              Requested by: Troy Frees, MD [address] [place], [place] [id number] PCP: Troy Frees, MD  Chief Complaint  Patient presents with  . Left Knee - Pain      HPI: Patient is a 71 year old gentleman who was driving cars for work he states that when he reached to get the papers he struck his knee on the car door and had acute onset of pain of the left patella.  He did not fall in the leg he sustained blunt trauma.  Assessment & Plan: Visit Diagnoses:  1. Acute pain of left knee   2. Contusion of left knee, initial encounter     Plan: Patient does have an effusion in the knee this will be aspirated.  Patient symptomatically has an injury to the patella possibly a stress fracture through the patella.  No fractures are visible on the x-ray.  We will place him in a knee immobilizer and have him wear this for 3 weeks and follow-up in 3 weeks with 2 view radiographs of the left knee.  Follow-Up Instructions: Return in about 3 weeks (around 01/27/2017).   Ortho Exam  Patient is alert, oriented, no adenopathy, well-dressed, normal affect, normal respiratory effort. Antalgic gait.  Examination he cannot do a straight leg raise or hold his leg straight.  The patella tendon and quad tendon are palpated and intact.  There is no palpable defect of the patella.  Radiographs shows no patella fracture.  The aspirate from the left knee was clear with no signs of fracture involving the joint.  Collaterals and cruciates are stable.  Does have brawny venous stasis changes in both legs no venous ulcers he states he has 6  Compression stockings but is not wearing them today.  Imaging: Xr Knee 1-2 Views Left  Result Date: 01/06/2017 2 view radiographs of the left knee shows no evidence of a fracture.  There are bony spurs in the patella  with joint space narrowing as well as subluxation and mild spurring in the medial lateral joint line.  No images are attached to the encounter.  Labs: Lab Results  Component Value Date   HGBA1C 6.9 (H) 02/22/2015   LABURIC 7.6 01/28/2016    Orders:  Orders Placed This Encounter  Procedures  . XR Knee 1-2 Views Left   No orders of the defined types were placed in this encounter.    Procedures: Large Joint Inj Date/Time: 01/06/2017 3:39 PM Performed by: [name] Authorized by: [name]   Consent Given by:  Patient Site marked: the procedure site was marked   Timeout: prior to procedure the correct patient, procedure, and site was verified   Indications:  Pain and diagnostic evaluation Location:  Knee Site:  L knee Prep: patient was prepped and draped in usual sterile fashion   Needle Size:  18 G Needle Length:  1.5 inches Ultrasound Guidance: No   Fluoroscopic Guidance: No   Arthrogram: No   Medications:  1 mL lidocaine 1 % Aspiration Attempted: No   Aspirate amount (mL):  30 Aspirate:  Clear Patient tolerance:  Patient tolerated the procedure well with no immediate complications    Clinical Data: No additional findings.  ROS:  All other systems negative, except as noted in the HPI. Review of Systems  Objective: Vital Signs: There were no vitals taken for this visit.  Specialty Comments:  No specialty comments available.  PMFS History: Patient Active Problem List   Diagnosis Date Noted  . Hyperkalemia 08/28/2016  . Hyperkalemia, diminished renal excretion 08/27/2016  . DM2 (diabetes mellitus, type 2) ([place]) 08/27/2016  . Dysphagia 08/27/2016  . Acute kidney injury superimposed on CKD ([place]) 08/27/2016  . Gout 08/27/2016  . Mitral insufficiency 09/23/2011  . Hypertension   . Permanent atrial fibrillation ([place])   . Chronic anticoagulation   . Pulmonary hypertension ([place])   . Hyperlipidemia    Past Medical History:  Diagnosis Date  .  Atrial fibrillation ([place])   . Chronic anticoagulation   . Complication of anesthesia    ' hard time waking up" per patient   . Diabetes mellitus    type 2  . GERD (gastroesophageal reflux disease)   . Hyperlipidemia   . Hypertension   . Mitral regurgitation    a. Echo (09/2011):  EF 55%, mod MR, severe LAE, severe RAE, PASP 57-61, trivial eff  . Permanent atrial fibrillation (HCC)    chronic atrial fib  . Pulmonary hypertension ([place])   . Pulmonary hypertension (HCC)     Family History  Problem Relation Age of Onset  . Hypertension Mother   . Alzheimer's disease Mother   . Heart disease Father        cabgx2, respiratory failure, pacemaker    Past Surgical History:  Procedure Laterality Date  . CARDIOVERSION    . CHOLECYSTECTOMY N/A 03/01/2015   Procedure: LAPAROSCOPIC CHOLECYSTECTOMY;  Surgeon: Troy Ok, MD;  Location: [facility] ORS;  Service: General;  Laterality: N/A;  . spur on heel     Social History   Occupational History  . Not on file.   Social History Main Topics  . Smoking status: Never Smoker  . Smokeless tobacco: Never Used  . Alcohol use Yes     Comment: margarita   . Drug use: No  . Sexual activity: Not on file

## 2017-02-01 ENCOUNTER — Ambulatory Visit (INDEPENDENT_AMBULATORY_CARE_PROVIDER_SITE_OTHER): Payer: Worker's Compensation

## 2017-02-01 ENCOUNTER — Encounter (INDEPENDENT_AMBULATORY_CARE_PROVIDER_SITE_OTHER): Payer: Self-pay | Admitting: Orthopedic Surgery

## 2017-02-01 ENCOUNTER — Ambulatory Visit (INDEPENDENT_AMBULATORY_CARE_PROVIDER_SITE_OTHER): Payer: Worker's Compensation | Admitting: Orthopedic Surgery

## 2017-02-01 DIAGNOSIS — S8002XA Contusion of left knee, initial encounter: Secondary | ICD-10-CM | POA: Diagnosis not present

## 2017-02-01 DIAGNOSIS — M25562 Pain in left knee: Secondary | ICD-10-CM

## 2017-02-01 NOTE — Progress Notes (Signed)
Office Visit Note   [name]           Date of Birth: [birth year]           MRN: [id number] Visit Date: 02/01/2017              Requested by: Shirline Frees, MD [place], [place] [address] PCP: Shirline Frees, MD  Chief Complaint  Patient presents with  . Left Knee - Pain    Suspected patellar fx      HPI: Patient is a 71 year old gentleman presents status post contusion left knee initially was unable to extend his knee.  Patient states his knee feels better but he feels like he needs the knee immobilizer for support.  Assessment & Plan: Visit Diagnoses:  1. Acute pain of left knee   2. Contusion of left knee, initial encounter     Plan: Recommended that he advanced to a neoprene sleeve to help with the knee stability and warmth.  Patient requested a return to work note and he was given a note that he can return to work on Wednesday.  Follow-Up Instructions: Return if symptoms worsen or fail to improve.   Ortho Exam  Patient is alert, oriented, no adenopathy, well-dressed, normal affect, normal respiratory effort. Examination patient does have an antalgic gait.  He still has an effusion but not as severe as an initial presentation.  Patient can do a straight leg raise without any extensor lag the extensor mechanism is intact.  Patient has no tenderness to palpation over the medial or lateral joint line collaterals and cruciates are stable.  Radiographs shows no evidence of a displaced fracture.  Imaging: Xr Knee 1-2 Views Left  Result Date: 02/01/2017 2 view radiographs of the left knee shows periarticular bony spurs no collapse of the joint space no evidence of a fracture of the patella.  No images are attached to the encounter.  Labs: Lab Results  Component Value Date   HGBA1C 6.9 (H) 02/22/2015   LABURIC 7.6 01/28/2016    Orders:  Orders Placed This Encounter  Procedures  . XR Knee 1-2 Views Left   No orders of  the defined types were placed in this encounter.    Procedures: No procedures performed  Clinical Data: No additional findings.  ROS:  All other systems negative, except as noted in the HPI. Review of Systems  Objective: Vital Signs: There were no vitals taken for this visit.  Specialty Comments:  No specialty comments available.  PMFS History: Patient Active Problem List   Diagnosis Date Noted  . Hyperkalemia 08/28/2016  . Hyperkalemia, diminished renal excretion 08/27/2016  . DM2 (diabetes mellitus, type 2) ([place]) 08/27/2016  . Dysphagia 08/27/2016  . Acute kidney injury superimposed on CKD ([place]) 08/27/2016  . Gout 08/27/2016  . Mitral insufficiency 09/23/2011  . Hypertension   . Permanent atrial fibrillation ([place])   . Chronic anticoagulation   . Pulmonary hypertension ([place])   . Hyperlipidemia    Past Medical History:  Diagnosis Date  . Atrial fibrillation ([place])   . Chronic anticoagulation   . Complication of anesthesia    ' hard time waking up" per patient   . Diabetes mellitus    type 2  . GERD (gastroesophageal reflux disease)   . Hyperlipidemia   . Hypertension   . Mitral regurgitation    a. Echo (09/2011):  EF 55%, mod MR, severe LAE, severe RAE, PASP 57-61, trivial eff  . Permanent  atrial fibrillation (HCC)    chronic atrial fib  . Pulmonary hypertension ([place])   . Pulmonary hypertension (HCC)     Family History  Problem Relation Age of Onset  . Hypertension Mother   . Alzheimer's disease Mother   . Heart disease Father        cabgx2, respiratory failure, pacemaker    Past Surgical History:  Procedure Laterality Date  . CARDIOVERSION    . LAPAROSCOPIC CHOLECYSTECTOMY N/A 03/01/2015   Performed by Ralene Ok, MD at [facility] ORS  . spur on heel     Social History   Occupational History  . Not on file  Tobacco Use  . Smoking status: Never Smoker  . Smokeless tobacco: Never Used  Substance and Sexual Activity  . Alcohol use: Yes     Comment: margarita   . Drug use: No  . Sexual activity: Not on file

## 2017-02-03 ENCOUNTER — Telehealth (INDEPENDENT_AMBULATORY_CARE_PROVIDER_SITE_OTHER): Payer: Self-pay | Admitting: Orthopedic Surgery

## 2017-02-03 ENCOUNTER — Telehealth (INDEPENDENT_AMBULATORY_CARE_PROVIDER_SITE_OTHER): Payer: Self-pay

## 2017-02-03 NOTE — Telephone Encounter (Signed)
I received vm yesterday from [facility] wanting to know if we were treating pt under WC. [phone]. We are currently filing WC. Called pt to get verbal ok to s/w [facility] and he gave permission. Needs question answered by Dr, Sharol Given that he called about earlier before they can proceed with claim and PT.

## 2017-02-03 NOTE — Telephone Encounter (Signed)
Please call his nurse case manager.  Knee pain is secondary to recent acute blunt trauma.  I agree with setting him up for physical therapy for strengthening and range of motion of the knee.

## 2017-02-03 NOTE — Telephone Encounter (Signed)
I called and left voicemail for message for case manager of message below. Advised if something needs to be faxed, to please call back and leave a fax number.

## 2017-02-03 NOTE — Telephone Encounter (Signed)
Patients leg swells and won't bend, difficult to work. WC case manager,Chris Bolaneu [phone] needs a statement knowing whether his pain is related to the knee cap or his previous arthritis so that [facility] can cover physical therapy for this patient. If you can call case manager to advise -[phone].  Patients # is [phone]

## 2017-03-15 DIAGNOSIS — K219 Gastro-esophageal reflux disease without esophagitis: Secondary | ICD-10-CM | POA: Diagnosis not present

## 2017-03-15 DIAGNOSIS — Z7901 Long term (current) use of anticoagulants: Secondary | ICD-10-CM | POA: Diagnosis not present

## 2017-03-15 DIAGNOSIS — I10 Essential (primary) hypertension: Secondary | ICD-10-CM | POA: Diagnosis not present

## 2017-03-15 DIAGNOSIS — M7989 Other specified soft tissue disorders: Secondary | ICD-10-CM | POA: Diagnosis not present

## 2017-03-25 DIAGNOSIS — K219 Gastro-esophageal reflux disease without esophagitis: Secondary | ICD-10-CM | POA: Diagnosis not present

## 2017-03-25 DIAGNOSIS — R12 Heartburn: Secondary | ICD-10-CM | POA: Diagnosis not present

## 2017-04-15 DIAGNOSIS — I4891 Unspecified atrial fibrillation: Secondary | ICD-10-CM | POA: Diagnosis not present

## 2017-04-15 DIAGNOSIS — Z7901 Long term (current) use of anticoagulants: Secondary | ICD-10-CM | POA: Diagnosis not present

## 2017-05-06 DIAGNOSIS — M25562 Pain in left knee: Secondary | ICD-10-CM | POA: Diagnosis not present

## 2017-05-07 DIAGNOSIS — R12 Heartburn: Secondary | ICD-10-CM | POA: Diagnosis not present

## 2017-05-07 DIAGNOSIS — K219 Gastro-esophageal reflux disease without esophagitis: Secondary | ICD-10-CM | POA: Diagnosis not present

## 2017-05-13 ENCOUNTER — Ambulatory Visit (INDEPENDENT_AMBULATORY_CARE_PROVIDER_SITE_OTHER): Payer: Self-pay | Admitting: Orthopedic Surgery

## 2017-05-20 DIAGNOSIS — Z7901 Long term (current) use of anticoagulants: Secondary | ICD-10-CM | POA: Diagnosis not present

## 2017-05-20 DIAGNOSIS — I4891 Unspecified atrial fibrillation: Secondary | ICD-10-CM | POA: Diagnosis not present

## 2017-06-17 DIAGNOSIS — Z7901 Long term (current) use of anticoagulants: Secondary | ICD-10-CM | POA: Diagnosis not present

## 2017-06-17 DIAGNOSIS — I4891 Unspecified atrial fibrillation: Secondary | ICD-10-CM | POA: Diagnosis not present

## 2017-07-08 DIAGNOSIS — N183 Chronic kidney disease, stage 3 (moderate): Secondary | ICD-10-CM | POA: Diagnosis not present

## 2017-07-12 DIAGNOSIS — M2041 Other hammer toe(s) (acquired), right foot: Secondary | ICD-10-CM | POA: Diagnosis not present

## 2017-07-12 DIAGNOSIS — B351 Tinea unguium: Secondary | ICD-10-CM | POA: Diagnosis not present

## 2017-07-12 DIAGNOSIS — L97519 Non-pressure chronic ulcer of other part of right foot with unspecified severity: Secondary | ICD-10-CM | POA: Diagnosis not present

## 2017-07-16 DIAGNOSIS — Z7901 Long term (current) use of anticoagulants: Secondary | ICD-10-CM | POA: Diagnosis not present

## 2017-07-16 DIAGNOSIS — D631 Anemia in chronic kidney disease: Secondary | ICD-10-CM | POA: Diagnosis not present

## 2017-07-16 DIAGNOSIS — N2581 Secondary hyperparathyroidism of renal origin: Secondary | ICD-10-CM | POA: Diagnosis not present

## 2017-07-16 DIAGNOSIS — I4891 Unspecified atrial fibrillation: Secondary | ICD-10-CM | POA: Diagnosis not present

## 2017-07-16 DIAGNOSIS — I129 Hypertensive chronic kidney disease with stage 1 through stage 4 chronic kidney disease, or unspecified chronic kidney disease: Secondary | ICD-10-CM | POA: Diagnosis not present

## 2017-07-16 DIAGNOSIS — N183 Chronic kidney disease, stage 3 (moderate): Secondary | ICD-10-CM | POA: Diagnosis not present

## 2017-07-19 DIAGNOSIS — L97511 Non-pressure chronic ulcer of other part of right foot limited to breakdown of skin: Secondary | ICD-10-CM | POA: Diagnosis not present

## 2017-08-03 DIAGNOSIS — L97511 Non-pressure chronic ulcer of other part of right foot limited to breakdown of skin: Secondary | ICD-10-CM | POA: Diagnosis not present

## 2017-08-11 DIAGNOSIS — L97511 Non-pressure chronic ulcer of other part of right foot limited to breakdown of skin: Secondary | ICD-10-CM | POA: Diagnosis not present

## 2017-08-17 DIAGNOSIS — Z7901 Long term (current) use of anticoagulants: Secondary | ICD-10-CM | POA: Diagnosis not present

## 2017-08-17 DIAGNOSIS — I4891 Unspecified atrial fibrillation: Secondary | ICD-10-CM | POA: Diagnosis not present

## 2017-08-19 DIAGNOSIS — L97511 Non-pressure chronic ulcer of other part of right foot limited to breakdown of skin: Secondary | ICD-10-CM | POA: Diagnosis not present

## 2017-08-26 DIAGNOSIS — M2041 Other hammer toe(s) (acquired), right foot: Secondary | ICD-10-CM | POA: Diagnosis not present

## 2017-08-30 ENCOUNTER — Ambulatory Visit: Payer: Medicare Other | Admitting: Podiatry

## 2017-09-08 ENCOUNTER — Ambulatory Visit (INDEPENDENT_AMBULATORY_CARE_PROVIDER_SITE_OTHER): Payer: Medicare Other | Admitting: Podiatry

## 2017-09-08 ENCOUNTER — Ambulatory Visit: Payer: Self-pay

## 2017-09-08 DIAGNOSIS — L97512 Non-pressure chronic ulcer of other part of right foot with fat layer exposed: Secondary | ICD-10-CM

## 2017-09-08 DIAGNOSIS — I70235 Atherosclerosis of native arteries of right leg with ulceration of other part of foot: Secondary | ICD-10-CM

## 2017-09-08 DIAGNOSIS — M2041 Other hammer toe(s) (acquired), right foot: Secondary | ICD-10-CM | POA: Diagnosis not present

## 2017-09-08 DIAGNOSIS — E0843 Diabetes mellitus due to underlying condition with diabetic autonomic (poly)neuropathy: Secondary | ICD-10-CM | POA: Diagnosis not present

## 2017-09-08 NOTE — Patient Instructions (Signed)
Pre-Operative Instructions  Congratulations, you have decided to take an important step towards improving your quality of life.  You can be assured that the doctors and staff at [facility] will be with you every step of the way.  Here are some important things you should know:  1. Plan to be at the surgery center/hospital at least 1 (one) hour prior to your scheduled time, unless otherwise directed by the surgical center/hospital staff.  You must have a responsible adult accompany you, remain during the surgery and drive you home.  Make sure you have directions to the surgical center/hospital to ensure you arrive on time. 2. If you are having surgery at [facility] or [facility] hospitals, you will need a copy of your medical history and physical form from your family physician within one month prior to the date of surgery. We will give you a form for your primary physician to complete.  3. We make every effort to accommodate the date you request for surgery.  However, there are times where surgery dates or times have to be moved.  We will contact you as soon as possible if a change in schedule is required.   4. No aspirin/ibuprofen for one week before surgery.  If you are on aspirin, any non-steroidal anti-inflammatory medications (Mobic, Aleve, Ibuprofen) should not be taken seven (7) days prior to your surgery.  You make take Tylenol for pain prior to surgery.  5. Medications - If you are taking daily heart and blood pressure medications, seizure, reflux, allergy, asthma, anxiety, pain or diabetes medications, make sure you notify the surgery center/hospital before the day of surgery so they can tell you which medications you should take or avoid the day of surgery. 6. No food or drink after midnight the night before surgery unless directed otherwise by surgical center/hospital staff. 7. No alcoholic beverages 24-hours prior to surgery.  No smoking 24-hours prior or 24-hours after  surgery. 8. Wear loose pants or shorts. They should be loose enough to fit over bandages, boots, and casts. 9. Don't wear slip-on shoes. Sneakers are preferred. 10. Bring your boot with you to the surgery center/hospital.  Also bring crutches or a walker if your physician has prescribed it for you.  If you do not have this equipment, it will be provided for you after surgery. 11. If you have not been contacted by the surgery center/hospital by the day before your surgery, call to confirm the date and time of your surgery. 12. Leave-time from work may vary depending on the type of surgery you have.  Appropriate arrangements should be made prior to surgery with your employer. 13. Prescriptions will be provided immediately following surgery by your doctor.  Fill these as soon as possible after surgery and take the medication as directed. Pain medications will not be refilled on weekends and must be approved by the doctor. 14. Remove nail polish on the operative foot and avoid getting pedicures prior to surgery. 15. Wash the night before surgery.  The night before surgery wash the foot and leg well with water and the antibacterial soap provided. Be sure to pay special attention to beneath the toenails and in between the toes.  Wash for at least three (3) minutes. Rinse thoroughly with water and dry well with a towel.  Perform this wash unless told not to do so by your physician.  Enclosed: 1 Ice pack (please put in freezer the night before surgery)   1 Hibiclens skin cleaner     Pre-op instructions  If you have any questions regarding the instructions, please do not hesitate to call our office.  [place]: [address], [place], [place] [address] -- [phone]  [place]: [address] [place], [place] [address] -- [phone]  [place]: [address]  , [place] [address] -- [phone]  [place]: [address], [address], [place], [place] [address] -- [phone]  Website:  [email or web address] 

## 2017-09-10 ENCOUNTER — Encounter: Payer: Self-pay | Admitting: *Deleted

## 2017-09-12 NOTE — Progress Notes (Signed)
HPI: 72 year old male presenting today as a new patient with a chief complaint of a hammertoe of the right second toe that has been present for several years. He also noted an ulceration of the right 2nd toe that has been present for the past 8 months. Wearing shoes and bearing weight increases the symptoms. He has been dressing the wound daily with a bandage. Patient is here for further evaluation and treatment.   Past Medical History:  Diagnosis Date  . Atrial fibrillation ([place])   . Chronic anticoagulation   . Complication of anesthesia    ' hard time waking up" per patient   . Diabetes mellitus    type 2  . GERD (gastroesophageal reflux disease)   . Hyperlipidemia   . Hypertension   . Mitral regurgitation    a. Echo (09/2011):  EF 55%, mod MR, severe LAE, severe RAE, PASP 57-61, trivial eff  . Permanent atrial fibrillation (HCC)    chronic atrial fib  . Pulmonary hypertension ([place])   . Pulmonary hypertension (HCC)       Objective: Physical Exam General: The patient is alert and oriented x3 in no acute distress.  Dermatology: Wound #1 noted to the medial right second toe measuring 0.5 x 0.8 x 0.1 cm.  To the above-noted ulceration, there is no eschar. There is a moderate amount of slough, fibrin and necrotic tissue. Granulation tissue and wound base is red. There is no malodor. There is a minimal amount of serosanginous drainage noted. Periwound integrity is intact.   Vascular: Palpable pedal pulses bilaterally. No edema or erythema noted. Capillary refill within normal limits.  Neurological: Epicritic and protective threshold grossly intact bilaterally.   Musculoskeletal Exam: All pedal and ankle joints range of motion within normal limits bilateral. Muscle strength 5/5 in all groups bilateral. Hammertoe contracture deformity noted to digits 2-5 of the right foot  Radiographic Exam: Hammertoe contracture deformity noted to the interphalangeal joints and MPJ of the  respective hammertoe digits mentioned on clinical musculoskeletal exam.     Assessment: 1. 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 Patient evaluated. X-Rays reviewed.  2. Today we discussed the conservative versus surgical management of the presenting pathology. The patient opts for surgical management. All possible complications and details of the procedure were explained. All patient questions were answered. No guarantees were expressed or implied. 3. Authorization for surgery was initiated today. Surgery will consist of PIPJ arthroplasty with MPJ capsulotomy digits 2-5 of the right foot.  4. CAM boot dispensed.  5. Medically necessary excisional debridement including subcutaneous tissue was performed using a tissue nipper and a chisel blade. Excisional debridement of all the necrotic nonviable tissue down to healthy bleeding viable tissue was performed with post-debridement measurements same as pre-. 6. The wound was cleansed and dry sterile dressing applied. 7. Continue dry sterile dressing daily. Explained that the ulceration is secondary to hammertoe deformity.  8. Return to clinic one week post op.      Edrick Kins, DPM [facility]  Dr. Edrick Kins, DPM    [address],  [place] [address]  Office [phone]  Fax [phone]

## 2017-09-17 DIAGNOSIS — I4891 Unspecified atrial fibrillation: Secondary | ICD-10-CM | POA: Diagnosis not present

## 2017-09-17 DIAGNOSIS — Z7901 Long term (current) use of anticoagulants: Secondary | ICD-10-CM | POA: Diagnosis not present

## 2017-09-20 ENCOUNTER — Telehealth: Payer: Self-pay | Admitting: *Deleted

## 2017-09-20 NOTE — Telephone Encounter (Signed)
"  When is my surgery scheduled.  It needs to be scheduled for August but for some reason I think it is scheduled for July."  I have you scheduled for August 15.  "That's when it is supposed to be.  I couldn't remember.  I wanted to make sure."

## 2017-10-04 DIAGNOSIS — Z7901 Long term (current) use of anticoagulants: Secondary | ICD-10-CM | POA: Diagnosis not present

## 2017-10-04 DIAGNOSIS — I4891 Unspecified atrial fibrillation: Secondary | ICD-10-CM | POA: Diagnosis not present

## 2017-10-12 DIAGNOSIS — Z7901 Long term (current) use of anticoagulants: Secondary | ICD-10-CM | POA: Diagnosis not present

## 2017-10-12 DIAGNOSIS — I4891 Unspecified atrial fibrillation: Secondary | ICD-10-CM | POA: Diagnosis not present

## 2017-10-19 DIAGNOSIS — M109 Gout, unspecified: Secondary | ICD-10-CM | POA: Diagnosis not present

## 2017-10-19 DIAGNOSIS — I10 Essential (primary) hypertension: Secondary | ICD-10-CM | POA: Diagnosis not present

## 2017-10-19 DIAGNOSIS — Z7901 Long term (current) use of anticoagulants: Secondary | ICD-10-CM | POA: Diagnosis not present

## 2017-10-19 DIAGNOSIS — I4891 Unspecified atrial fibrillation: Secondary | ICD-10-CM | POA: Diagnosis not present

## 2017-10-19 DIAGNOSIS — N184 Chronic kidney disease, stage 4 (severe): Secondary | ICD-10-CM | POA: Diagnosis not present

## 2017-10-19 DIAGNOSIS — E1122 Type 2 diabetes mellitus with diabetic chronic kidney disease: Secondary | ICD-10-CM | POA: Diagnosis not present

## 2017-10-19 DIAGNOSIS — D692 Other nonthrombocytopenic purpura: Secondary | ICD-10-CM | POA: Diagnosis not present

## 2017-10-19 DIAGNOSIS — K219 Gastro-esophageal reflux disease without esophagitis: Secondary | ICD-10-CM | POA: Diagnosis not present

## 2017-10-19 DIAGNOSIS — M2041 Other hammer toe(s) (acquired), right foot: Secondary | ICD-10-CM | POA: Diagnosis not present

## 2017-10-19 DIAGNOSIS — Z1389 Encounter for screening for other disorder: Secondary | ICD-10-CM | POA: Diagnosis not present

## 2017-10-19 DIAGNOSIS — E782 Mixed hyperlipidemia: Secondary | ICD-10-CM | POA: Diagnosis not present

## 2017-10-19 DIAGNOSIS — R5383 Other fatigue: Secondary | ICD-10-CM | POA: Diagnosis not present

## 2017-10-22 DIAGNOSIS — Z1211 Encounter for screening for malignant neoplasm of colon: Secondary | ICD-10-CM | POA: Diagnosis not present

## 2017-10-28 ENCOUNTER — Other Ambulatory Visit: Payer: Self-pay | Admitting: Podiatry

## 2017-10-28 ENCOUNTER — Encounter: Payer: Self-pay | Admitting: Podiatry

## 2017-10-28 DIAGNOSIS — M21541 Acquired clubfoot, right foot: Secondary | ICD-10-CM | POA: Diagnosis not present

## 2017-10-28 DIAGNOSIS — M2041 Other hammer toe(s) (acquired), right foot: Secondary | ICD-10-CM | POA: Diagnosis not present

## 2017-10-28 DIAGNOSIS — M7751 Other enthesopathy of right foot: Secondary | ICD-10-CM | POA: Diagnosis not present

## 2017-10-28 DIAGNOSIS — K219 Gastro-esophageal reflux disease without esophagitis: Secondary | ICD-10-CM | POA: Diagnosis not present

## 2017-11-01 ENCOUNTER — Telehealth: Payer: Self-pay | Admitting: Podiatry

## 2017-11-01 NOTE — Telephone Encounter (Signed)
Called pt and left a voicemail that we were having to reschedule his appointment time for his first postop visit on Wednesday, 21 August from 8:15 am to 10:45 am due to Dr. Amalia Hailey being in surgery that morning. Asked pt to call us back at [phone] to let us know he received the voicemail and if the new appointment time works for him.

## 2017-11-03 ENCOUNTER — Ambulatory Visit (INDEPENDENT_AMBULATORY_CARE_PROVIDER_SITE_OTHER): Payer: Medicare Other

## 2017-11-03 ENCOUNTER — Telehealth: Payer: Self-pay | Admitting: *Deleted

## 2017-11-03 ENCOUNTER — Ambulatory Visit (INDEPENDENT_AMBULATORY_CARE_PROVIDER_SITE_OTHER): Payer: Medicare Other | Admitting: Podiatry

## 2017-11-03 ENCOUNTER — Encounter: Payer: Self-pay | Admitting: Podiatry

## 2017-11-03 DIAGNOSIS — Z9889 Other specified postprocedural states: Secondary | ICD-10-CM

## 2017-11-03 DIAGNOSIS — M2041 Other hammer toe(s) (acquired), right foot: Secondary | ICD-10-CM

## 2017-11-03 MED ORDER — HYDROCODONE-ACETAMINOPHEN 5-325 MG PO TABS: 1.0000 | ORAL_TABLET | Freq: Four times a day (QID) | ORAL | 0 refills | Status: DC | PRN

## 2017-11-03 NOTE — Telephone Encounter (Signed)
Pt states the oxycodone makes him dizzy, and he would like to take ibuprofen, that he has taken it at night after the surgery and sleeps better and had good pain control. I told pt that would be fine, and I would inform Dr. Amalia Hailey.

## 2017-11-07 NOTE — Progress Notes (Signed)
   Subjective:  Patient presents today status post right forefoot reconstruction surgery. DOS: 10/28/17. He reports the pain is decreasing. He states the first three nights after surgery he experienced significant pain but it is now easing up. He has been taking the Percocet and states he may want something that is not as strong. There are no modifying factors noted. Patient is here for further evaluation and treatment.    Past Medical History:  Diagnosis Date  . Atrial fibrillation ([place])   . Chronic anticoagulation   . Complication of anesthesia    ' hard time waking up" per patient   . Diabetes mellitus    type 2  . GERD (gastroesophageal reflux disease)   . Hyperlipidemia   . Hypertension   . Mitral regurgitation    a. Echo (09/2011):  EF 55%, mod MR, severe LAE, severe RAE, PASP 57-61, trivial eff  . Permanent atrial fibrillation (HCC)    chronic atrial fib  . Pulmonary hypertension ([place])   . Pulmonary hypertension ([place])       Objective/Physical Exam Neurovascular status intact.  Skin incisions appear to be well coapted with sutures and staples intact. No sign of infectious process noted. No dehiscence. No active bleeding noted. Moderate edema noted to the surgical extremity.  Radiographic Exam:  Orthopedic hardware and osteotomies sites appear to be stable with routine healing.  Assessment: 1. s/p right forefoot reconstruction surgery. DOS: 10/28/17.    Plan of Care:  1. Patient was evaluated. X-rays reviewed 2. Dressing changed. Keep clean, dry and intact in one week.  3. Continue weightbearing in CAM boot.  4. Prescription for Vicodin 5/325 mg provided to patient.  5. Return to clinic in one week.    Edrick Kins, DPM [facility]  Dr. Edrick Kins, [address]                                        [place], [place] [address]                Office [phone]  Fax [phone]

## 2017-11-10 ENCOUNTER — Ambulatory Visit (INDEPENDENT_AMBULATORY_CARE_PROVIDER_SITE_OTHER): Payer: Medicare Other | Admitting: Podiatry

## 2017-11-10 DIAGNOSIS — Z9889 Other specified postprocedural states: Secondary | ICD-10-CM | POA: Diagnosis not present

## 2017-11-10 DIAGNOSIS — M2041 Other hammer toe(s) (acquired), right foot: Secondary | ICD-10-CM | POA: Diagnosis not present

## 2017-11-10 MED ORDER — OXYCODONE-ACETAMINOPHEN 5-325 MG PO TABS: 1.0000 | ORAL_TABLET | ORAL | 0 refills | Status: DC | PRN

## 2017-11-13 NOTE — Progress Notes (Signed)
   Subjective:  Patient presents today status post right forefoot reconstruction surgery. DOS: 10/28/17. He states he is doing well. He has been using the CAM boot and taking the antibiotics as directed. He denies any new complaints at this time. Patient is here for further evaluation and treatment.    Past Medical History:  Diagnosis Date  . Atrial fibrillation ([place])   . Chronic anticoagulation   . Complication of anesthesia    ' hard time waking up" per patient   . Diabetes mellitus    type 2  . GERD (gastroesophageal reflux disease)   . Hyperlipidemia   . Hypertension   . Mitral regurgitation    a. Echo (09/2011):  EF 55%, mod MR, severe LAE, severe RAE, PASP 57-61, trivial eff  . Permanent atrial fibrillation (HCC)    chronic atrial fib  . Pulmonary hypertension ([place])   . Pulmonary hypertension ([place])       Objective/Physical Exam Neurovascular status intact.  Skin incisions appear to be well coapted with sutures and staples intact. No sign of infectious process noted. No dehiscence. No active bleeding noted. Moderate edema noted to the surgical extremity.   Assessment: 1. s/p right forefoot reconstruction surgery. DOS: 10/28/17.    Plan of Care:  1. Patient was evaluated.  2. Sutures/staples removed. Dry sterile dressing applied. Keep clean, dry and intact for one week.  3. Percutaneous pin removed from 2nd toe.  4. Post op shoe dispensed. Discontinue using CAM boot.  5. Finish taking oral antibiotics.  6. Return to clinic in one week.    Edrick Kins, DPM [facility]  Dr. Edrick Kins, [place]                                        [place], [place] [address]                Office [phone]  Fax [phone]

## 2017-11-17 ENCOUNTER — Ambulatory Visit (INDEPENDENT_AMBULATORY_CARE_PROVIDER_SITE_OTHER): Payer: Self-pay | Admitting: Podiatry

## 2017-11-17 ENCOUNTER — Ambulatory Visit (INDEPENDENT_AMBULATORY_CARE_PROVIDER_SITE_OTHER): Payer: Medicare Other

## 2017-11-17 DIAGNOSIS — M2041 Other hammer toe(s) (acquired), right foot: Secondary | ICD-10-CM

## 2017-11-17 DIAGNOSIS — Z9889 Other specified postprocedural states: Secondary | ICD-10-CM

## 2017-11-22 NOTE — Progress Notes (Signed)
   Subjective:  Patient presents today status post right forefoot reconstruction surgery. DOS: 10/28/17. He states he is doing well. He denies any pain or modifying factors. Patient is here for further evaluation and treatment.    Past Medical History:  Diagnosis Date  . Atrial fibrillation ([place])   . Chronic anticoagulation   . Complication of anesthesia    ' hard time waking up" per patient   . Diabetes mellitus    type 2  . GERD (gastroesophageal reflux disease)   . Hyperlipidemia   . Hypertension   . Mitral regurgitation    a. Echo (09/2011):  EF 55%, mod MR, severe LAE, severe RAE, PASP 57-61, trivial eff  . Permanent atrial fibrillation (HCC)    chronic atrial fib  . Pulmonary hypertension ([place])   . Pulmonary hypertension ([place])       Objective/Physical Exam Neurovascular status intact.  Skin incisions appear to be well coapted. No sign of infectious process noted. No dehiscence. No active bleeding noted. Moderate edema noted to the surgical extremity.  Radiographic Exam:  Orthopedic hardware and osteotomies sites appear to be stable with routine healing.  Assessment: 1. s/p right forefoot reconstruction surgery. DOS: 10/28/17.    Plan of Care:  1. Patient was evaluated. X-Rays reviewed.  2. Percutaneous pins removed.  3. Transition back into good sneakers.  4. Return to clinic in 2 weeks.    Edrick Kins, DPM [facility]  Dr. Edrick Kins, [place]                                        [place], [place] [address]                Office [phone]  Fax [phone]

## 2017-12-01 ENCOUNTER — Ambulatory Visit: Payer: Self-pay

## 2017-12-01 ENCOUNTER — Encounter: Payer: Self-pay | Admitting: Podiatry

## 2017-12-01 ENCOUNTER — Ambulatory Visit (INDEPENDENT_AMBULATORY_CARE_PROVIDER_SITE_OTHER): Payer: Medicare Other | Admitting: Podiatry

## 2017-12-01 DIAGNOSIS — Z9889 Other specified postprocedural states: Secondary | ICD-10-CM

## 2017-12-01 DIAGNOSIS — M2041 Other hammer toe(s) (acquired), right foot: Secondary | ICD-10-CM | POA: Diagnosis not present

## 2017-12-04 NOTE — Progress Notes (Signed)
   Subjective:  Patient presents today status post right forefoot reconstruction surgery. DOS: 10/28/17. He reports some continued pain that is worse when lying down. He states the pain is affecting his sleep. He reports associated swelling. He has tried icing and elevating the foot for treatment. Patient is here for further evaluation and treatment.    Past Medical History:  Diagnosis Date  . Atrial fibrillation ([place])   . Chronic anticoagulation   . Complication of anesthesia    ' hard time waking up" per patient   . Diabetes mellitus    type 2  . GERD (gastroesophageal reflux disease)   . Hyperlipidemia   . Hypertension   . Mitral regurgitation    a. Echo (09/2011):  EF 55%, mod MR, severe LAE, severe RAE, PASP 57-61, trivial eff  . Permanent atrial fibrillation (HCC)    chronic atrial fib  . Pulmonary hypertension ([place])   . Pulmonary hypertension ([place])       Objective/Physical Exam Neurovascular status intact.  Skin incisions appear to be well coapted. No sign of infectious process noted. No dehiscence. No active bleeding noted. Moderate post surgical edema noted to the right forefoot.    Assessment: 1. s/p right forefoot reconstruction surgery. DOS: 10/28/17.  2. Post surgical edema right forefoot   Plan of Care:  1. Patient was evaluated.  2. Compression anklet dispensed.  3. Recommended good shoe gear.  4. Explained that swelling will take time to resolve.  5. Return to clinic in 6 weeks.    Edrick Kins, DPM [facility]  Dr. Edrick Kins, [address] [place]                                        [place], [place] [address]                Office [phone]  Fax [phone]

## 2017-12-10 DIAGNOSIS — I4891 Unspecified atrial fibrillation: Secondary | ICD-10-CM | POA: Diagnosis not present

## 2017-12-10 DIAGNOSIS — Z7901 Long term (current) use of anticoagulants: Secondary | ICD-10-CM | POA: Diagnosis not present

## 2017-12-17 NOTE — Progress Notes (Signed)
DOS: 10/28/2017 Hammer toe repair digits 2-5 RT with capsulotomy   Dr. Durenda Guthrie

## 2018-01-07 DIAGNOSIS — Z7901 Long term (current) use of anticoagulants: Secondary | ICD-10-CM | POA: Diagnosis not present

## 2018-01-07 DIAGNOSIS — I4891 Unspecified atrial fibrillation: Secondary | ICD-10-CM | POA: Diagnosis not present

## 2018-01-10 DIAGNOSIS — I4891 Unspecified atrial fibrillation: Secondary | ICD-10-CM | POA: Diagnosis not present

## 2018-01-10 DIAGNOSIS — N189 Chronic kidney disease, unspecified: Secondary | ICD-10-CM | POA: Diagnosis not present

## 2018-01-10 DIAGNOSIS — E669 Obesity, unspecified: Secondary | ICD-10-CM | POA: Diagnosis not present

## 2018-01-10 DIAGNOSIS — D6869 Other thrombophilia: Secondary | ICD-10-CM | POA: Diagnosis not present

## 2018-01-10 DIAGNOSIS — M109 Gout, unspecified: Secondary | ICD-10-CM | POA: Diagnosis not present

## 2018-01-10 DIAGNOSIS — D6859 Other primary thrombophilia: Secondary | ICD-10-CM | POA: Insufficient documentation

## 2018-01-10 DIAGNOSIS — E66811 Obesity, class 1: Secondary | ICD-10-CM | POA: Insufficient documentation

## 2018-01-10 DIAGNOSIS — R109 Unspecified abdominal pain: Secondary | ICD-10-CM | POA: Diagnosis not present

## 2018-01-10 DIAGNOSIS — I129 Hypertensive chronic kidney disease with stage 1 through stage 4 chronic kidney disease, or unspecified chronic kidney disease: Secondary | ICD-10-CM | POA: Diagnosis not present

## 2018-01-12 ENCOUNTER — Encounter: Payer: Self-pay | Admitting: Podiatry

## 2018-01-19 ENCOUNTER — Encounter: Payer: Self-pay | Admitting: Podiatry

## 2018-02-02 DIAGNOSIS — N2581 Secondary hyperparathyroidism of renal origin: Secondary | ICD-10-CM | POA: Insufficient documentation

## 2018-02-09 ENCOUNTER — Ambulatory Visit (INDEPENDENT_AMBULATORY_CARE_PROVIDER_SITE_OTHER): Payer: Medicare Other | Admitting: Podiatry

## 2018-02-09 ENCOUNTER — Other Ambulatory Visit: Payer: Self-pay

## 2018-02-09 DIAGNOSIS — R600 Localized edema: Secondary | ICD-10-CM | POA: Diagnosis not present

## 2018-02-14 NOTE — Progress Notes (Signed)
   Subjective:  Patient presents today status post right forefoot reconstruction surgery. DOS: 10/28/17. He states he is improving. He still reports some continued swelling of the toes and states it is difficult to get his foot into regular shoes. There are no modifying factors noted. He has been using compression socks for treatment. Patient is here for further evaluation and treatment.   Past Medical History:  Diagnosis Date  . Atrial fibrillation ([place])   . Chronic anticoagulation   . Complication of anesthesia    ' hard time waking up" per patient   . Diabetes mellitus    type 2  . GERD (gastroesophageal reflux disease)   . Hyperlipidemia   . Hypertension   . Mitral regurgitation    a. Echo (09/2011):  EF 55%, mod MR, severe LAE, severe RAE, PASP 57-61, trivial eff  . Permanent atrial fibrillation (HCC)    chronic atrial fib  . Pulmonary hypertension ([place])   . Pulmonary hypertension (HCC)       Objective: Physical Exam General: The patient is alert and oriented x3 in no acute distress.  Dermatology: Skin incisions are healed. No sign of infectious process noted. Skin is cool, dry and supple bilateral lower extremities. Negative for open lesions or macerations.  Vascular: Edema noted to the right forefoot. Palpable pedal pulses bilaterally. No erythema noted. Capillary refill within normal limits.  Neurological: Epicritic and protective threshold grossly intact bilaterally.   Musculoskeletal Exam: All pedal and ankle joints range of motion within normal limits bilateral. Muscle strength 5/5 in all groups bilateral.    Assessment: 1. Edema right forefoot    Plan of Care:  1. Patient was evaluated.  2. Continue using compression socks.  3. Recommended good shoe gear.  4. Return to clinic as needed.    Edrick Kins, DPM [facility]  Dr. Edrick Kins, [place]                                        [place], [place] [address]                 Office [phone]  Fax [phone]

## 2018-02-21 DIAGNOSIS — Z5181 Encounter for therapeutic drug level monitoring: Secondary | ICD-10-CM | POA: Diagnosis not present

## 2018-02-21 DIAGNOSIS — D6869 Other thrombophilia: Secondary | ICD-10-CM | POA: Diagnosis not present

## 2018-02-21 DIAGNOSIS — N183 Chronic kidney disease, stage 3 (moderate): Secondary | ICD-10-CM | POA: Diagnosis not present

## 2018-02-21 DIAGNOSIS — I4891 Unspecified atrial fibrillation: Secondary | ICD-10-CM | POA: Diagnosis not present

## 2018-02-21 DIAGNOSIS — D631 Anemia in chronic kidney disease: Secondary | ICD-10-CM | POA: Diagnosis not present

## 2018-02-21 DIAGNOSIS — M549 Dorsalgia, unspecified: Secondary | ICD-10-CM | POA: Diagnosis not present

## 2018-02-21 DIAGNOSIS — Z23 Encounter for immunization: Secondary | ICD-10-CM | POA: Diagnosis not present

## 2018-02-21 DIAGNOSIS — I272 Pulmonary hypertension, unspecified: Secondary | ICD-10-CM | POA: Diagnosis not present

## 2018-02-21 DIAGNOSIS — N2581 Secondary hyperparathyroidism of renal origin: Secondary | ICD-10-CM | POA: Diagnosis not present

## 2018-03-23 ENCOUNTER — Other Ambulatory Visit: Payer: Self-pay | Admitting: Family Medicine

## 2018-03-23 DIAGNOSIS — M549 Dorsalgia, unspecified: Secondary | ICD-10-CM

## 2018-03-27 NOTE — Progress Notes (Signed)
[place], [place], [place]  [address] Phone: [phone] Fax:  [phone]  Date:  03/30/2018   ID:  [name], DOB Feb 03, 1946, MRN [id number]  PCP:  Buzzy Han, MD  Cardiologist:  Dr. Peter Martinique     History of Present Illness: [name] is a 73 y.o. male seen for follow up Afib. He has a  hx of permanent AFib, mod MR, pulmonary HTN, HTN, HL, T2DM.  Echo (09/2011):  EF 55%, mod MR, severe LAE, severe RAE, PASP 57-61, trivial eff. Last seen in September 2016.   He underwent lap cholecystectomy in 2016. In 2018 he was admitted with hyperkalemia and AKI that responded to hydration. He has had hammer toe surgery.  On follow up today he is doing well.  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  He reports good BP control at home. No dizziness or syncope. No SOB or chest pain. He is having a fair amount of back pain and underwent thoracic and lumbar CT this am.   Recent Labs: No results found for requested labs within last 8760 hours.  Wt Readings from Last 3 Encounters:  03/30/18 245 lb (111.1 kg)  08/28/16 218 lb 3.2 oz (99 kg)  01/28/16 228 lb (103.4 kg)     Past Medical History:  Diagnosis Date  . Atrial fibrillation ([place])   . Chronic anticoagulation   . Complication of anesthesia    ' hard time waking up" per patient   . Diabetes mellitus    type 2  . GERD (gastroesophageal reflux disease)   . Hyperlipidemia   . Hypertension   . Mitral regurgitation    a. Echo (09/2011):  EF 55%, mod MR, severe LAE, severe RAE, PASP 57-61, trivial eff  . Permanent atrial fibrillation    chronic atrial fib  . Pulmonary hypertension ([place])   . Pulmonary hypertension ([place])     Current Outpatient Medications  Medication Sig Dispense Refill  . acetaminophen (TYLENOL) 500 MG tablet Take 1,000 mg by mouth every 6 (six) hours as needed for mild pain.    Marland Kitchen allopurinol (ZYLOPRIM) 100 MG tablet TAKE 1 TABLET BY MOUTH TWICE A DAY 60  tablet 1  . cephALEXin (KEFLEX) 500 MG capsule Take 500 mg by mouth 3 (three) times daily.  0  . colchicine 0.6 MG tablet Take 1 tablet (0.6 mg total) by mouth 2 (two) times daily. Take BID for acute gout attack, stop when acute pain resolves 60 tablet 3  . furosemide (LASIX) 20 MG tablet     . Glucose Blood (FREESTYLE LITE TEST VI)     . HYDROcodone-acetaminophen (NORCO/VICODIN) 5-325 MG tablet Take 1 tablet by mouth every 6 (six) hours as needed for moderate pain. 30 tablet 0  . lidocaine (LIDODERM) 5 % Place 1 patch onto the skin daily. Remove & Discard patch within 12 hours or as directed by MD    . metFORMIN (GLUCOPHAGE) 1000 MG tablet Take 1 tablet by mouth 2 (two) times daily.    . metoprolol (LOPRESSOR) 50 MG tablet Take 50 mg by mouth 2 (two) times daily.     Marland Kitchen omeprazole (PRILOSEC) 40 MG capsule Take by mouth daily.  2  . oxyCODONE-acetaminophen (PERCOCET/ROXICET) 5-325 MG tablet Take 1 tablet by mouth every 4 (four) hours as needed. for pain 30 tablet 0  . simvastatin (ZOCOR) 20 MG tablet Take 20 mg by mouth at bedtime.    . trolamine salicylate (ASPERCREME)  10 % cream Apply 1 application topically as needed for muscle pain.    Marland Kitchen warfarin (COUMADIN) 2 MG tablet Take 2 mg by mouth every evening.     No current facility-administered medications for this visit.     Allergies:   Patient has no known allergies.   Social History:  The patient  reports that he has never smoked. He has never used smokeless tobacco. He reports current alcohol use. He reports that he does not use drugs.   Family History:  The patient's family history includes Alzheimer's disease in his mother; Heart disease in his father; Hypertension in his mother.   ROS:  Please see the history of present illness.      All other systems reviewed and negative.   PHYSICAL EXAM: VS:  BP (!) 142/76 (BP Location: Right Arm, Cuff Size: Normal)   Pulse 63   Ht 6\' 3"  (1.905 m)   Wt 245 lb (111.1 kg)   BMI 30.62 kg/m    GENERAL:  Well appearing WM in NAD HEENT:  PERRL, EOMI, sclera are clear. Oropharynx is clear. NECK:  No jugular venous distention, carotid upstroke brisk and symmetric, no bruits, no thyromegaly or adenopathy Back is kyphotic.  HEART:  IRRR,  PMI not displaced or sustained,S1 and S2 within normal limits, no S3, no S4: no clicks, no rubs, 2/6 holosystolic systolic murmur at the apex ABD:  Soft, nontender. BS +, no masses or bruits. No hepatomegaly, no splenomegaly EXT:  2 + pulses throughout, no edema, no cyanosis no clubbing SKIN:  Warm and dry.  No rashes NEURO:  Alert and oriented x 3. Cranial nerves II through XII intact. PSYCH:  Cognitively intact    Laboratory data: Lab Results  Component Value Date   WBC 9.6 08/28/2016   HGB 10.4 (L) 08/28/2016   HCT 32.1 (L) 08/28/2016   PLT 170 08/28/2016   GLUCOSE 178 (H) 08/28/2016   ALT 14 (L) 08/27/2016   AST 16 08/27/2016   NA 141 08/28/2016   K 5.6 (H) 08/28/2016   CL 115 (H) 08/28/2016   CREATININE 2.63 (H) 08/28/2016   BUN 65 (H) 08/28/2016   CO2 21 (L) 08/28/2016   INR 1.54 08/27/2016   HGBA1C 6.9 (H) 02/22/2015   Dated 10/19/17: cholesterol 145, triglycerides 125, HDL 38, LDL 82. A1c 7.5%. Hgb 13.1. creatinine 1.71. other chemistries and TSH normal.  EKG:  AFib, HR 47. No acute ST changes. I have personally reviewed and interpreted this study.  Echo 12/24/14: Study Conclusions  - Left ventricle: The cavity size was mildly dilated. Wall   thickness was normal. Systolic function was normal. The estimated   ejection fraction was in the range of 60% to 65%. - Mitral valve: There was moderate regurgitation. - Left atrium: The atrium was severely dilated. - Right ventricle: The cavity size was moderately dilated. Wall   thickness was normal. - Right atrium: The atrium was moderately dilated  ASSESSMENT AND PLAN:  1. Atrial Fibrillation:  Rate is well controlled on metoprolol.  Coumadin is managed by PCP. He reports INRs  have been therapeutic. He has been on coumadin for a long time now without any problems so I am not compelled to switch to a NOAC now. If he should have problems maintaining a good INR we could switch.  2. Mitral Regurgitation: moderate.  He is asymptomatic. We will update Echo 3. Hypertension:  BP under fair control  4. Hyperlipidemia:  Continue statin.    Signed, Peter Martinique  MD, [facility]    03/30/2018 4:58 PM

## 2018-03-28 DIAGNOSIS — M546 Pain in thoracic spine: Secondary | ICD-10-CM | POA: Diagnosis not present

## 2018-03-30 ENCOUNTER — Ambulatory Visit
Admission: RE | Admit: 2018-03-30 | Discharge: 2018-03-30 | Disposition: A | Discharge status: Home / Self Care | Payer: Medicare Other | Source: Ambulatory Visit | Attending: Family Medicine | Admitting: Family Medicine

## 2018-03-30 ENCOUNTER — Ambulatory Visit (INDEPENDENT_AMBULATORY_CARE_PROVIDER_SITE_OTHER): Payer: Medicare Other | Admitting: Cardiology

## 2018-03-30 ENCOUNTER — Encounter: Payer: Self-pay | Admitting: Cardiology

## 2018-03-30 VITALS — BP 142/76 | HR 63 | Ht 75.0 in | Wt 245.0 lb

## 2018-03-30 DIAGNOSIS — I482 Chronic atrial fibrillation, unspecified: Secondary | ICD-10-CM | POA: Diagnosis not present

## 2018-03-30 DIAGNOSIS — M48061 Spinal stenosis, lumbar region without neurogenic claudication: Secondary | ICD-10-CM | POA: Diagnosis not present

## 2018-03-30 DIAGNOSIS — I340 Nonrheumatic mitral (valve) insufficiency: Secondary | ICD-10-CM | POA: Diagnosis not present

## 2018-03-30 DIAGNOSIS — I10 Essential (primary) hypertension: Secondary | ICD-10-CM

## 2018-03-30 DIAGNOSIS — M5127 Other intervertebral disc displacement, lumbosacral region: Secondary | ICD-10-CM | POA: Diagnosis not present

## 2018-03-30 DIAGNOSIS — M4807 Spinal stenosis, lumbosacral region: Secondary | ICD-10-CM | POA: Diagnosis not present

## 2018-03-30 DIAGNOSIS — M5126 Other intervertebral disc displacement, lumbar region: Secondary | ICD-10-CM | POA: Diagnosis not present

## 2018-03-30 DIAGNOSIS — M549 Dorsalgia, unspecified: Secondary | ICD-10-CM

## 2018-03-30 DIAGNOSIS — M5124 Other intervertebral disc displacement, thoracic region: Secondary | ICD-10-CM | POA: Diagnosis not present

## 2018-03-30 NOTE — Patient Instructions (Signed)
Medication Instructions:  Continue same medications If you need a refill on your cardiac medications before your next appointment, please call your pharmacy.   Lab work: None ordered   Testing/Procedures: Schedule Echo  Follow-Up: At [facility], you and your health needs are our priority.  As part of our continuing mission to provide you with exceptional heart care, we have created designated Provider Care Teams.  These Care Teams include your primary Cardiologist (physician) and Advanced Practice Providers (APPs -  Physician Assistants and Nurse Practitioners) who all work together to provide you with the care you need, when you need it. . Follow Up appointment with Dr.Jordan in 1 year Call 3 months before to schedule

## 2018-04-05 ENCOUNTER — Other Ambulatory Visit (HOSPITAL_COMMUNITY): Payer: Self-pay

## 2018-04-06 DIAGNOSIS — M546 Pain in thoracic spine: Secondary | ICD-10-CM | POA: Diagnosis not present

## 2018-04-20 DIAGNOSIS — M546 Pain in thoracic spine: Secondary | ICD-10-CM | POA: Diagnosis not present

## 2018-04-25 DIAGNOSIS — M546 Pain in thoracic spine: Secondary | ICD-10-CM | POA: Diagnosis not present

## 2018-04-27 DIAGNOSIS — M546 Pain in thoracic spine: Secondary | ICD-10-CM | POA: Diagnosis not present

## 2018-05-30 DIAGNOSIS — I129 Hypertensive chronic kidney disease with stage 1 through stage 4 chronic kidney disease, or unspecified chronic kidney disease: Secondary | ICD-10-CM | POA: Diagnosis not present

## 2018-05-30 DIAGNOSIS — N183 Chronic kidney disease, stage 3 (moderate): Secondary | ICD-10-CM | POA: Diagnosis not present

## 2018-05-30 DIAGNOSIS — N2581 Secondary hyperparathyroidism of renal origin: Secondary | ICD-10-CM | POA: Diagnosis not present

## 2018-05-30 DIAGNOSIS — N189 Chronic kidney disease, unspecified: Secondary | ICD-10-CM | POA: Diagnosis not present

## 2018-05-30 DIAGNOSIS — D631 Anemia in chronic kidney disease: Secondary | ICD-10-CM | POA: Diagnosis not present

## 2018-06-22 DIAGNOSIS — Z5181 Encounter for therapeutic drug level monitoring: Secondary | ICD-10-CM | POA: Diagnosis not present

## 2018-09-02 DIAGNOSIS — M549 Dorsalgia, unspecified: Secondary | ICD-10-CM | POA: Diagnosis not present

## 2018-09-02 DIAGNOSIS — I4891 Unspecified atrial fibrillation: Secondary | ICD-10-CM | POA: Diagnosis not present

## 2018-09-02 DIAGNOSIS — D6869 Other thrombophilia: Secondary | ICD-10-CM | POA: Diagnosis not present

## 2018-09-02 DIAGNOSIS — I272 Pulmonary hypertension, unspecified: Secondary | ICD-10-CM | POA: Diagnosis not present

## 2018-09-02 DIAGNOSIS — K219 Gastro-esophageal reflux disease without esophagitis: Secondary | ICD-10-CM | POA: Diagnosis not present

## 2018-09-02 DIAGNOSIS — E669 Obesity, unspecified: Secondary | ICD-10-CM | POA: Diagnosis not present

## 2018-09-02 DIAGNOSIS — I129 Hypertensive chronic kidney disease with stage 1 through stage 4 chronic kidney disease, or unspecified chronic kidney disease: Secondary | ICD-10-CM | POA: Diagnosis not present

## 2018-09-02 DIAGNOSIS — Z5181 Encounter for therapeutic drug level monitoring: Secondary | ICD-10-CM | POA: Diagnosis not present

## 2018-09-02 DIAGNOSIS — Z7901 Long term (current) use of anticoagulants: Secondary | ICD-10-CM | POA: Diagnosis not present

## 2018-09-02 DIAGNOSIS — N2581 Secondary hyperparathyroidism of renal origin: Secondary | ICD-10-CM | POA: Diagnosis not present

## 2018-09-02 DIAGNOSIS — N183 Chronic kidney disease, stage 3 (moderate): Secondary | ICD-10-CM | POA: Diagnosis not present

## 2018-09-02 DIAGNOSIS — M109 Gout, unspecified: Secondary | ICD-10-CM | POA: Diagnosis not present

## 2019-01-23 DIAGNOSIS — D6869 Other thrombophilia: Secondary | ICD-10-CM | POA: Diagnosis not present

## 2019-01-23 DIAGNOSIS — M549 Dorsalgia, unspecified: Secondary | ICD-10-CM | POA: Diagnosis not present

## 2019-01-23 DIAGNOSIS — N183 Chronic kidney disease, stage 3 unspecified: Secondary | ICD-10-CM | POA: Diagnosis not present

## 2019-01-23 DIAGNOSIS — I129 Hypertensive chronic kidney disease with stage 1 through stage 4 chronic kidney disease, or unspecified chronic kidney disease: Secondary | ICD-10-CM | POA: Diagnosis not present

## 2019-01-23 DIAGNOSIS — I4891 Unspecified atrial fibrillation: Secondary | ICD-10-CM | POA: Diagnosis not present

## 2019-01-26 DIAGNOSIS — D631 Anemia in chronic kidney disease: Secondary | ICD-10-CM | POA: Diagnosis not present

## 2019-01-26 DIAGNOSIS — N2581 Secondary hyperparathyroidism of renal origin: Secondary | ICD-10-CM | POA: Diagnosis not present

## 2019-01-26 DIAGNOSIS — N183 Chronic kidney disease, stage 3 unspecified: Secondary | ICD-10-CM | POA: Diagnosis not present

## 2019-01-26 DIAGNOSIS — N189 Chronic kidney disease, unspecified: Secondary | ICD-10-CM | POA: Diagnosis not present

## 2019-01-26 DIAGNOSIS — I129 Hypertensive chronic kidney disease with stage 1 through stage 4 chronic kidney disease, or unspecified chronic kidney disease: Secondary | ICD-10-CM | POA: Diagnosis not present

## 2019-01-30 DIAGNOSIS — B351 Tinea unguium: Secondary | ICD-10-CM | POA: Diagnosis not present

## 2019-02-06 DIAGNOSIS — N2581 Secondary hyperparathyroidism of renal origin: Secondary | ICD-10-CM | POA: Diagnosis not present

## 2019-02-13 DIAGNOSIS — N183 Chronic kidney disease, stage 3 unspecified: Secondary | ICD-10-CM | POA: Diagnosis not present

## 2019-02-28 DIAGNOSIS — M109 Gout, unspecified: Secondary | ICD-10-CM | POA: Diagnosis not present

## 2019-03-21 ENCOUNTER — Ambulatory Visit: Payer: Medicare Other | Attending: Internal Medicine

## 2019-03-21 ENCOUNTER — Other Ambulatory Visit: Payer: Self-pay

## 2019-03-21 DIAGNOSIS — Z20822 Contact with and (suspected) exposure to covid-19: Secondary | ICD-10-CM

## 2019-03-23 LAB — NOVEL CORONAVIRUS, NAA: SARS-CoV-2, NAA: DETECTED — AB

## 2019-03-28 DIAGNOSIS — I700 Atherosclerosis of aorta: Secondary | ICD-10-CM | POA: Insufficient documentation

## 2019-03-30 NOTE — Progress Notes (Deleted)
[place], [place], [place]  [address] Phone: [phone] Fax:  [phone]  Date:  03/30/2019   ID:  [name], DOB 1945-07-12, MRN [id number]  PCP:  Buzzy Han, MD  Cardiologist:  Dr. Raigen Jagielski Martin[place]     History of Present Illness: [name] is a 74 y.o. male seen for follow up Afib. He has a  hx of permanent AFib, mod MR, pulmonary HTN, HTN, HL, T2DM.  Echo (09/2011):  EF 55%, mod MR, severe LAE, severe RAE, PASP 57-61, trivial eff. Last seen in September 2016.   He underwent lap cholecystectomy in 2016. In 2018 he was admitted with hyperkalemia and AKI that responded to hydration. He has had hammer toe surgery.  On follow up today he is doing well.  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  He reports good BP control at home. No dizziness or syncope. No SOB or chest pain.   Recent Labs: No results found for requested labs within last 8760 hours.  Wt Readings from Last 3 Encounters:  03/30/18 245 lb (111.1 kg)  08/28/16 218 lb 3.2 oz (99 kg)  01/28/16 228 lb (103.4 kg)     Past Medical History:  Diagnosis Date  . Atrial fibrillation ([place])   . Chronic anticoagulation   . Complication of anesthesia    ' hard time waking up" per patient   . Diabetes mellitus    type 2  . GERD (gastroesophageal reflux disease)   . Hyperlipidemia   . Hypertension   . Mitral regurgitation    a. Echo (09/2011):  EF 55%, mod MR, severe LAE, severe RAE, PASP 57-61, trivial eff  . Permanent atrial fibrillation    chronic atrial fib  . Pulmonary hypertension ([place])   . Pulmonary hypertension ([place])     Current Outpatient Medications  Medication Sig Dispense Refill  . acetaminophen (TYLENOL) 500 MG tablet Take 1,000 mg by mouth every 6 (six) hours as needed for mild pain.    Marland Kitchen allopurinol (ZYLOPRIM) 100 MG tablet TAKE 1 TABLET BY MOUTH TWICE A DAY 60 tablet 1  . cephALEXin (KEFLEX) 500 MG capsule Take 500 mg by mouth 3 (three) times  daily.  0  . colchicine 0.6 MG tablet Take 1 tablet (0.6 mg total) by mouth 2 (two) times daily. Take BID for acute gout attack, stop when acute pain resolves 60 tablet 3  . furosemide (LASIX) 20 MG tablet     . Glucose Blood (FREESTYLE LITE TEST VI)     . HYDROcodone-acetaminophen (NORCO/VICODIN) 5-325 MG tablet Take 1 tablet by mouth every 6 (six) hours as needed for moderate pain. 30 tablet 0  . lidocaine (LIDODERM) 5 % Place 1 patch onto the skin daily. Remove & Discard patch within 12 hours or as directed by MD    . metFORMIN (GLUCOPHAGE) 1000 MG tablet Take 1 tablet by mouth 2 (two) times daily.    . metoprolol (LOPRESSOR) 50 MG tablet Take 50 mg by mouth 2 (two) times daily.     Marland Kitchen omeprazole (PRILOSEC) 40 MG capsule Take by mouth daily.  2  . oxyCODONE-acetaminophen (PERCOCET/ROXICET) 5-325 MG tablet Take 1 tablet by mouth every 4 (four) hours as needed. for pain 30 tablet 0  . simvastatin (ZOCOR) 20 MG tablet Take 20 mg by mouth at bedtime.    . trolamine salicylate (ASPERCREME) 10 % cream Apply 1 application topically as needed for muscle pain.    Marland Kitchen warfarin (  COUMADIN) 2 MG tablet Take 2 mg by mouth every evening.     No current facility-administered medications for this visit.    Allergies:   Patient has no known allergies.   Social History:  The patient  reports that he has never smoked. He has never used smokeless tobacco. He reports current alcohol use. He reports that he does not use drugs.   Family History:  The patient's family history includes Alzheimer's disease in his mother; Heart disease in his father; Hypertension in his mother.   ROS:  Please see the history of present illness.      All other systems reviewed and negative.   PHYSICAL EXAM: VS:  There were no vitals taken for this visit. GENERAL:  Well appearing WM in NAD HEENT:  PERRL, EOMI, sclera are clear. Oropharynx is clear. NECK:  No jugular venous distention, carotid upstroke brisk and symmetric, no bruits,  no thyromegaly or adenopathy Back is kyphotic.  HEART:  IRRR,  PMI not displaced or sustained,S1 and S2 within normal limits, no S3, no S4: no clicks, no rubs, 2/6 holosystolic systolic murmur at the apex ABD:  Soft, nontender. BS +, no masses or bruits. No hepatomegaly, no splenomegaly EXT:  2 + pulses throughout, no edema, no cyanosis no clubbing SKIN:  Warm and dry.  No rashes NEURO:  Alert and oriented x 3. Cranial nerves II through XII intact. PSYCH:  Cognitively intact    Laboratory data: Lab Results  Component Value Date   WBC 9.6 08/28/2016   HGB 10.4 (L) 08/28/2016   HCT 32.1 (L) 08/28/2016   PLT 170 08/28/2016   GLUCOSE 178 (H) 08/28/2016   ALT 14 (L) 08/27/2016   AST 16 08/27/2016   NA 141 08/28/2016   K 5.6 (H) 08/28/2016   CL 115 (H) 08/28/2016   CREATININE 2.63 (H) 08/28/2016   BUN 65 (H) 08/28/2016   CO2 21 (L) 08/28/2016   INR 1.54 08/27/2016   HGBA1C 6.9 (H) 02/22/2015   Dated 10/19/17: cholesterol 145, triglycerides 125, HDL 38, LDL 82. A1c 7.5%. Hgb 13.1. creatinine 1.71. other chemistries and TSH normal. Dated 02/13/19: creatinine 1.81, BUN 38. Otherwise BMET normal. CBC normal.  EKG:  AFib, HR 47. No acute ST changes. I have personally reviewed and interpreted this study.  Echo 12/24/14: Study Conclusions  - Left ventricle: The cavity size was mildly dilated. Wall   thickness was normal. Systolic function was normal. The estimated   ejection fraction was in the range of 60% to 65%. - Mitral valve: There was moderate regurgitation. - Left atrium: The atrium was severely dilated. - Right ventricle: The cavity size was moderately dilated. Wall   thickness was normal. - Right atrium: The atrium was moderately dilated  ASSESSMENT AND PLAN:  1. Atrial Fibrillation:  Rate is well controlled on metoprolol.  Coumadin is managed by PCP. He reports INRs have been therapeutic. He has been on coumadin for a long time now without any problems so I am not  compelled to switch to a NOAC now. If he should have problems maintaining a good INR we could switch.  2. Mitral Regurgitation: moderate.  He is asymptomatic. We will update Echo 3. Hypertension:  BP under fair control  4. Hyperlipidemia:  Continue statin.    Signed, Erleen Egner Martin[place] MD, [place]    03/30/2019 3:21 PM

## 2019-04-04 DIAGNOSIS — R300 Dysuria: Secondary | ICD-10-CM | POA: Diagnosis not present

## 2019-04-04 DIAGNOSIS — I4891 Unspecified atrial fibrillation: Secondary | ICD-10-CM | POA: Diagnosis not present

## 2019-04-05 ENCOUNTER — Encounter: Payer: Self-pay | Admitting: Family Medicine

## 2019-04-05 ENCOUNTER — Ambulatory Visit (INDEPENDENT_AMBULATORY_CARE_PROVIDER_SITE_OTHER): Payer: Medicare Other | Admitting: Family Medicine

## 2019-04-05 ENCOUNTER — Other Ambulatory Visit: Payer: Self-pay

## 2019-04-05 VITALS — BP 154/90 | HR 83

## 2019-04-05 DIAGNOSIS — M542 Cervicalgia: Secondary | ICD-10-CM | POA: Diagnosis not present

## 2019-04-05 MED ORDER — METHYLPREDNISOLONE 4 MG PO TBPK: ORAL_TABLET | ORAL | 0 refills | Status: DC

## 2019-04-05 MED ORDER — BACLOFEN 10 MG PO TABS: 5.0000 mg | ORAL_TABLET | Freq: Three times a day (TID) | ORAL | 1 refills | Status: DC | PRN

## 2019-04-05 NOTE — Progress Notes (Signed)
Office Visit Note   [name]           Date of Birth: April 09, 1945           MRN: [id number] Visit Date: 04/05/2019 Requested by: Buzzy Han, MD [place],  [place] [address] PCP: Buzzy Han, MD  Subjective: Chief Complaint  Patient presents with  . Neck - Pain    Headache, "feels like the top of my neck will blow off." Started with pain in the left side of his neck 4 days ago. The headache started yesterday. Hurts to hold his head up.    HPI: He is here with neck pain.  Symptoms started about 2 or 3 days ago, no injury.  Prior to the onset of his pain he did some minor lifting around the house, and took down some Christmas decorations.  He did not notice any pain at that time.  At first he was having some left hip discomfort but that seems to be better now.  2 days ago he woke up with severe pain in his neck whenever lifting his head up.  No radicular symptoms.  Pain is bilateral but mostly on the left.  He does have a history of gout which has been well controlled with allopurinol.  He has not had any attacks in several years.  He is a diet-controlled diabetic.              ROS:   All other systems were reviewed and are negative.  Objective: Vital Signs: BP (!) 154/90   Pulse 83   Physical Exam:  General:  Alert and oriented, in no acute distress. Pulm:  Breathing unlabored. Psy:  Normal mood, congruent affect. Skin: No rash on the skin. Neck: He has his head forward, unable to lift it up due to pain.  He is tender to palpation to the left of C3-4 and a little bit to the right as well.  No significant pain along the spinous processes.  He does not have any rhomboid area trigger points that reproduces pain.  Imaging: None today  Assessment & Plan: 1.  Acute neck pain, etiology uncertain but could be a gout flareup. -We will try a Medrol Dosepak and baclofen.  If symptoms persist then possibly x-rays followed by physical  therapy.     Procedures: No procedures performed  No notes on file     PMFS History: Patient Active Problem List   Diagnosis Date Noted  . Hyperkalemia 08/28/2016  . Hyperkalemia, diminished renal excretion 08/27/2016  . DM2 (diabetes mellitus, type 2) ([place]) 08/27/2016  . Dysphagia 08/27/2016  . Acute kidney injury superimposed on CKD ([place]) 08/27/2016  . Gout 08/27/2016  . Mitral insufficiency 09/23/2011  . Hypertension   . Permanent atrial fibrillation ([place])   . Chronic anticoagulation   . Pulmonary hypertension ([place])   . Hyperlipidemia    Past Medical History:  Diagnosis Date  . Atrial fibrillation ([place])   . Chronic anticoagulation   . Complication of anesthesia    ' hard time waking up" per patient   . Diabetes mellitus    type 2  . GERD (gastroesophageal reflux disease)   . Hyperlipidemia   . Hypertension   . Mitral regurgitation    a. Echo (09/2011):  EF 55%, mod MR, severe LAE, severe RAE, PASP 57-61, trivial eff  . Permanent atrial fibrillation ([facility])    chronic atrial fib  . Pulmonary hypertension ([place])   . Pulmonary hypertension ([place])  Family History  Problem Relation Age of Onset  . Hypertension Mother   . Alzheimer's disease Mother   . Heart disease Father        cabgx2, respiratory failure, pacemaker    Past Surgical History:  Procedure Laterality Date  . CARDIOVERSION    . CHOLECYSTECTOMY N/A 03/01/2015   Procedure: LAPAROSCOPIC CHOLECYSTECTOMY;  Surgeon: Ralene Ok, MD;  Location: [facility];  Service: General;  Laterality: N/A;  . spur on heel     Social History   Occupational History  . Not on file  Tobacco Use  . Smoking status: Never Smoker  . Smokeless tobacco: Never Used  Substance and Sexual Activity  . Alcohol use: Yes    Comment: margarita   . Drug use: No  . Sexual activity: Not on file

## 2019-04-06 ENCOUNTER — Ambulatory Visit: Payer: Medicare Other | Admitting: Cardiology

## 2019-04-11 ENCOUNTER — Telehealth: Payer: Self-pay | Admitting: Family Medicine

## 2019-04-11 ENCOUNTER — Encounter: Payer: Self-pay | Admitting: Family Medicine

## 2019-04-11 DIAGNOSIS — M542 Cervicalgia: Secondary | ICD-10-CM

## 2019-04-11 MED ORDER — METAXALONE 800 MG PO TABS: 800.0000 mg | ORAL_TABLET | Freq: Three times a day (TID) | ORAL | 1 refills | Status: DC | PRN

## 2019-04-11 MED ORDER — DICLOFENAC SODIUM 75 MG PO TBEC: 75.0000 mg | DELAYED_RELEASE_TABLET | Freq: Two times a day (BID) | ORAL | 3 refills | Status: DC | PRN

## 2019-04-11 MED ORDER — TIZANIDINE HCL 2 MG PO TABS: 2.0000 mg | ORAL_TABLET | Freq: Four times a day (QID) | ORAL | 1 refills | Status: DC | PRN

## 2019-04-11 NOTE — Telephone Encounter (Signed)
Pharmacy called.   There is a conflicting involving a current prescription for the patient and the one recently prescribed.    Call back number: [phone]

## 2019-04-11 NOTE — Telephone Encounter (Signed)
Skelaxin called in to take instead of zanaflex.

## 2019-04-11 NOTE — Telephone Encounter (Signed)
Tried calling the patient back numerous times - busy signal. Will try again in the morning.

## 2019-04-11 NOTE — Addendum Note (Signed)
Addended by: Hortencia Pilar on: 04/11/2019 04:37 PM   Modules accepted: Orders

## 2019-04-11 NOTE — Telephone Encounter (Signed)
Please advise 

## 2019-04-11 NOTE — Telephone Encounter (Signed)
Rx's sent.  Please fax PT Rx to [facility] location.

## 2019-04-11 NOTE — Telephone Encounter (Signed)
Pt called asking to speak to Terri, I offered to take a message and pt declined; pt stated they will call back tomorrow.

## 2019-04-11 NOTE — Telephone Encounter (Signed)
I called and advised the patient of the diclofenac and tizanidine Rxs, as well as the referral to [facility] PT at the [place] location. Will fax the order to them. I gave the patient their number so he may call them and schedule an appointment.

## 2019-04-11 NOTE — Telephone Encounter (Signed)
The patient is on Cipro for 10 days for a UTI (was prescribed on 04/05/19). We did not have that on his record here. Per the pharmacy ([facility]), Tizanidine and Cipro taken together can cause hypotension and bradycardia. I called the patient and spoke with him and his wife (to confirm he is taking the Cipro).  He is taking it. [facility] B[facility]of[facility], so the patient's wife wanted to know if Baclofen and Cipro together are also not ok to take together. Please advise.

## 2019-04-11 NOTE — Telephone Encounter (Signed)
Patient called and stated he took Prednisone and completed but still can't hold his head up. States can't drive and having trouble at home.  Wants to know what else you can prescribe to help him feel better.  Please call patient [phone]

## 2019-04-12 ENCOUNTER — Telehealth: Payer: Self-pay | Admitting: Family Medicine

## 2019-04-12 ENCOUNTER — Encounter: Payer: Self-pay | Admitting: Family Medicine

## 2019-04-12 ENCOUNTER — Other Ambulatory Visit: Payer: Self-pay

## 2019-04-12 MED ORDER — METAXALONE 800 MG PO TABS: 800.0000 mg | ORAL_TABLET | Freq: Three times a day (TID) | ORAL | 1 refills | Status: DC | PRN

## 2019-04-12 MED ORDER — DICLOFENAC SODIUM 75 MG PO TBEC: 75.0000 mg | DELAYED_RELEASE_TABLET | Freq: Two times a day (BID) | ORAL | 3 refills | Status: DC | PRN

## 2019-04-12 NOTE — Telephone Encounter (Signed)
See message from 04/12/19.

## 2019-04-12 NOTE — Telephone Encounter (Signed)
Before I call them, which of the medications is the likely cause of the dizziness?

## 2019-04-12 NOTE — Telephone Encounter (Signed)
Metaxalone is most likely to cause dizziness.

## 2019-04-12 NOTE — Telephone Encounter (Signed)
I called the patient's wife, as requested, and reached her voice mail. The muscle relaxer is most likely the cause of the dizziness (the Baclofen he was taking and the new Rx Metaxolone can also cause that). Sent both medications to the [facility] on [address]. Advised her to check with the pharmacy later.

## 2019-04-12 NOTE — Telephone Encounter (Signed)
Patient spouse called requesting refills be sent to [facility] [address]. [facility] Patient spouse stated please resend to correct pharmacy. Medications are as follow: Diclofenac, metaxalone. Patient's wife Manus Gunning asking for a call back from Dr. Junius Roads assistant or nurse. Not sure of which medication is causing dizziness. Patient spouse phone number [phone].

## 2019-04-12 NOTE — Telephone Encounter (Signed)
See the message from 04/12/19.

## 2019-04-13 DIAGNOSIS — M542 Cervicalgia: Secondary | ICD-10-CM | POA: Diagnosis not present

## 2019-04-13 DIAGNOSIS — R293 Abnormal posture: Secondary | ICD-10-CM | POA: Diagnosis not present

## 2019-04-18 DIAGNOSIS — M542 Cervicalgia: Secondary | ICD-10-CM | POA: Diagnosis not present

## 2019-04-18 DIAGNOSIS — R293 Abnormal posture: Secondary | ICD-10-CM | POA: Diagnosis not present

## 2019-04-20 DIAGNOSIS — R293 Abnormal posture: Secondary | ICD-10-CM | POA: Diagnosis not present

## 2019-04-20 DIAGNOSIS — M542 Cervicalgia: Secondary | ICD-10-CM | POA: Diagnosis not present

## 2019-04-25 DIAGNOSIS — M542 Cervicalgia: Secondary | ICD-10-CM | POA: Diagnosis not present

## 2019-04-25 DIAGNOSIS — R293 Abnormal posture: Secondary | ICD-10-CM | POA: Diagnosis not present

## 2019-04-27 DIAGNOSIS — B351 Tinea unguium: Secondary | ICD-10-CM | POA: Diagnosis not present

## 2019-04-29 IMAGING — CT CT L SPINE W/O CM
3 of 5 series · 12 of 33 positions shown, 15 images · non-contrast
Comparison: None.

CLINICAL DATA: Pain between the shoulders and upper back.

EXAM:
CT LUMBAR SPINE WITHOUT CONTRAST
TECHNIQUE: Multidetector CT imaging of the lumbar spine was performed without
intravenous contrast administration. Multiplanar CT image
reconstructions were also generated.

[Series 3: l-spine 2.00 br40 s3 lspine st · axial · 0.34mm/px · z∈[+1599,+1759]mm · 5 of 122 slices shown, 7 images]
[im 21/122  soft-tissue]
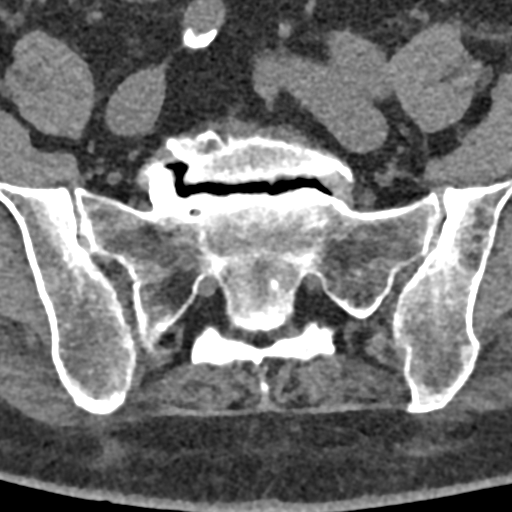
[im 21/122  bone]
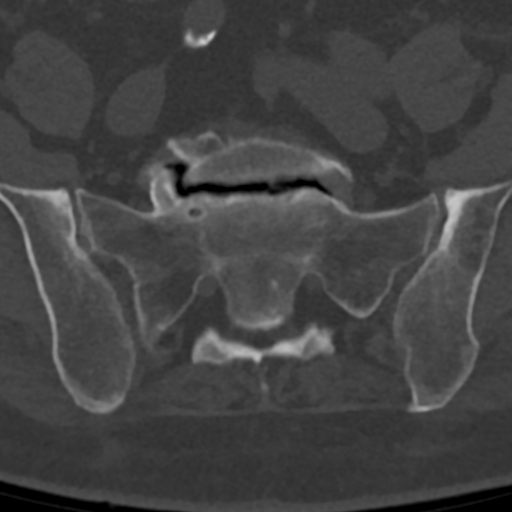
[im 41/122  bone]
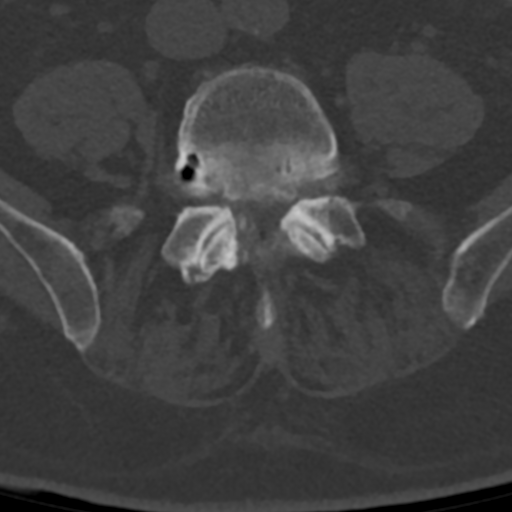
[im 61/122  bone]
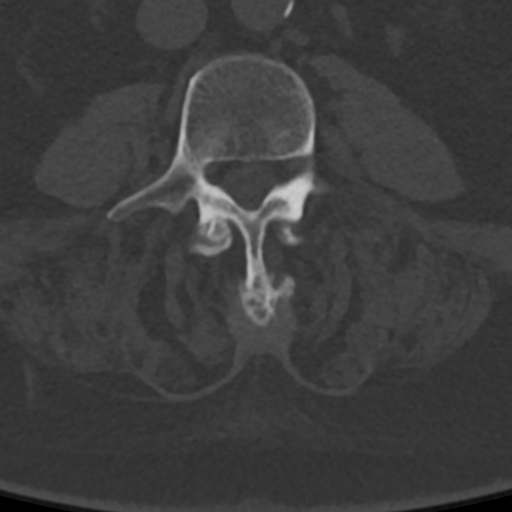
[im 81/122  bone]
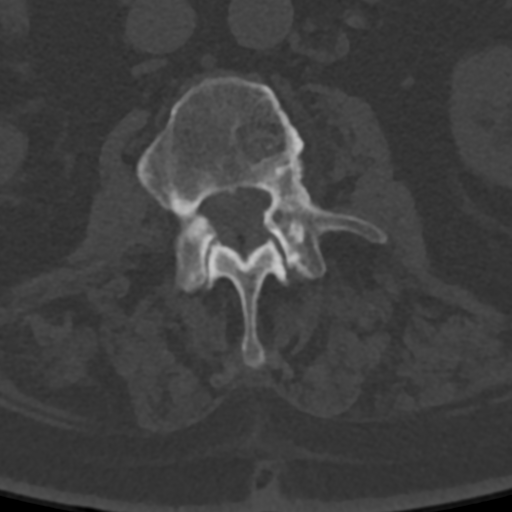
[im 101/122  soft-tissue]
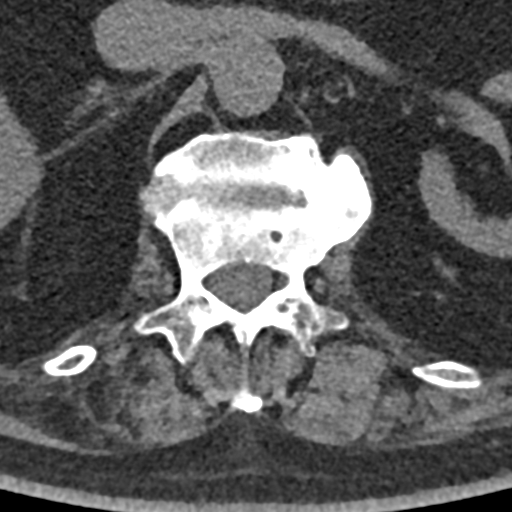
[im 101/122  bone]
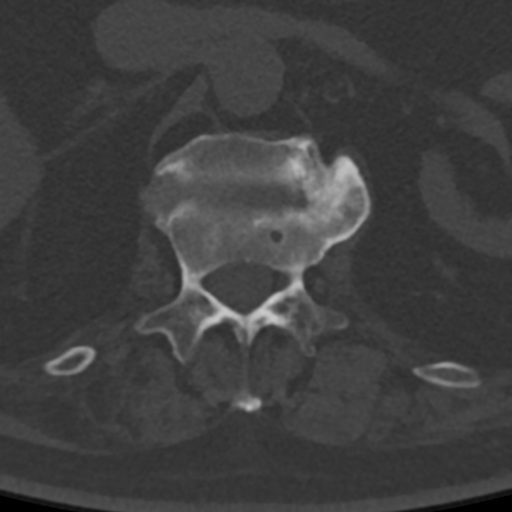

[Series 5: l-spine 2.00 br60 s3 sag sag bone · sagittal · 0.34mm/px · 5 of 62 slices shown, 6 images]
[im 21/62  bone]
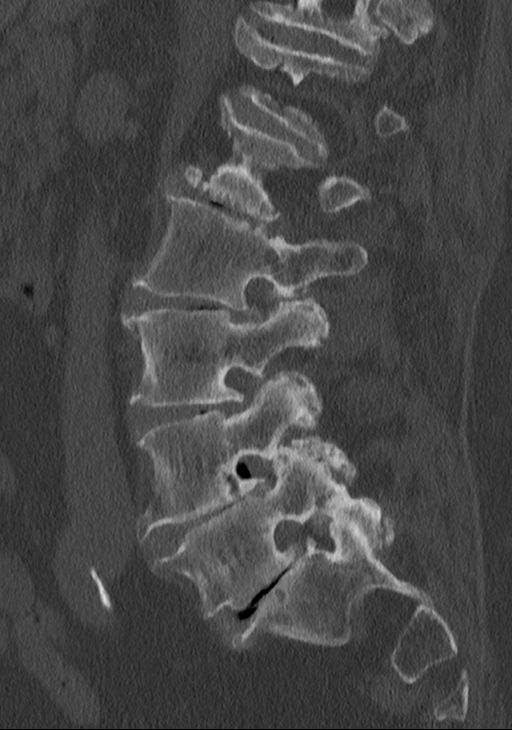
[im 26/62  bone]
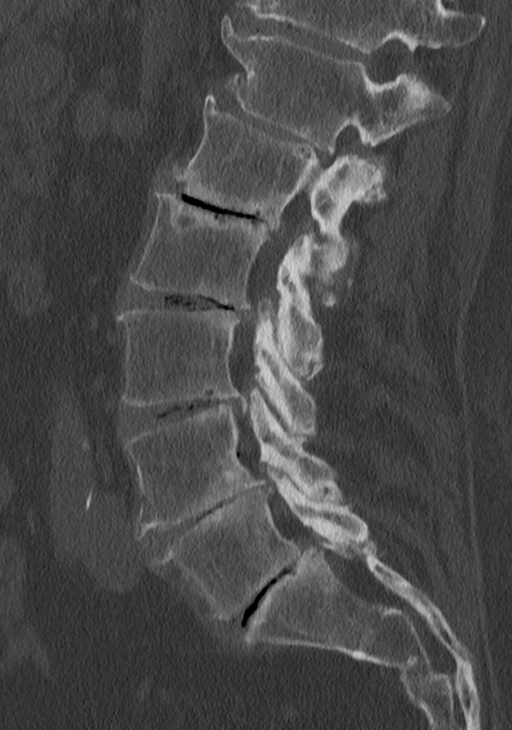
[im 31/62  soft-tissue]
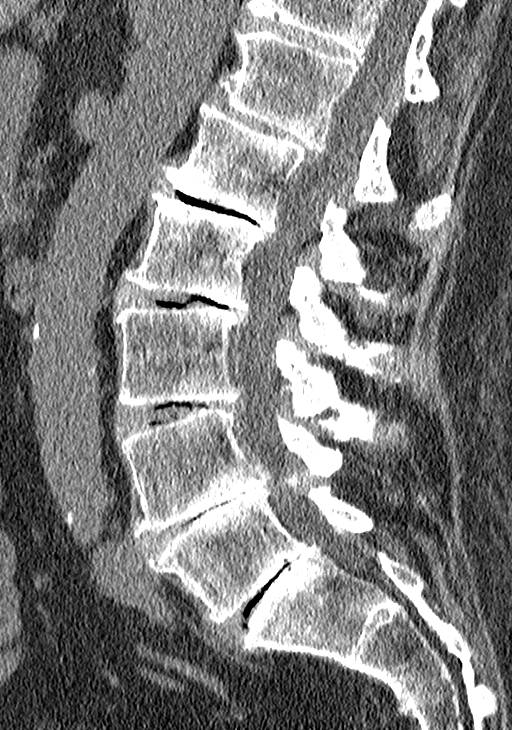
[im 31/62  bone]
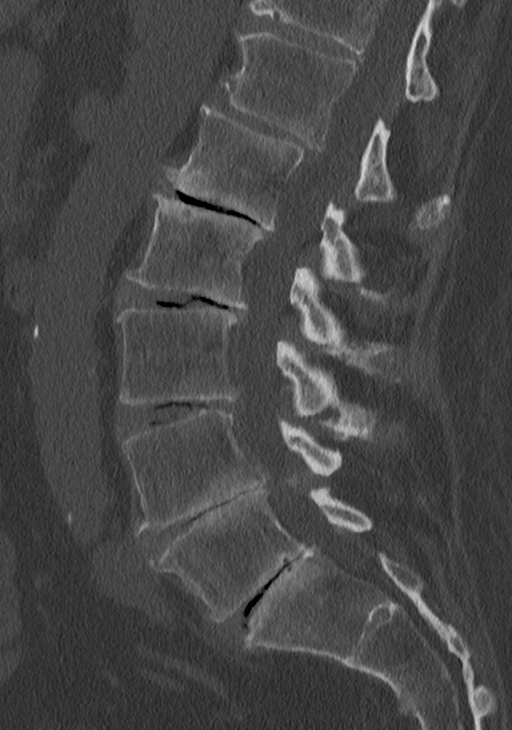
[im 36/62  bone]
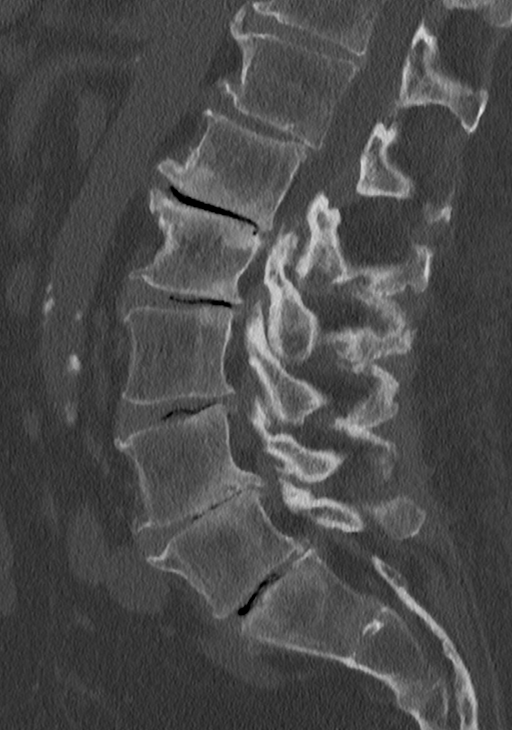
[im 41/62  bone]
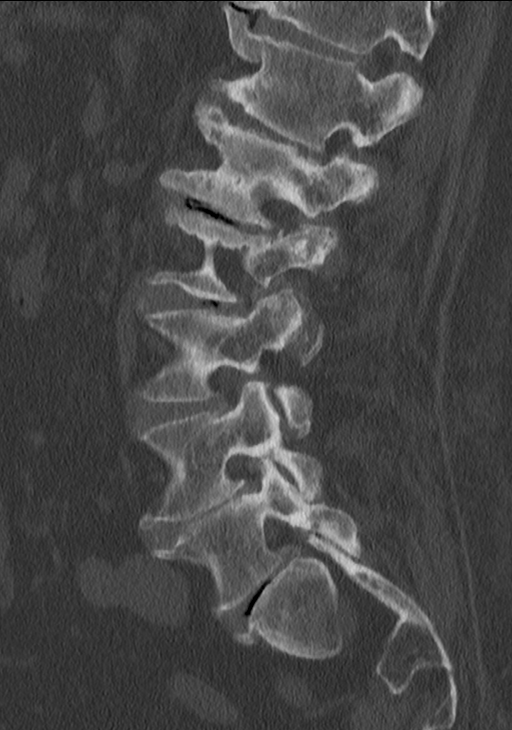

[Series 7: l-spine 2.00 br60 s3 cor cor bone · coronal · 0.34mm/px · 2 of 62 slices shown]
[im 21/62  bone]
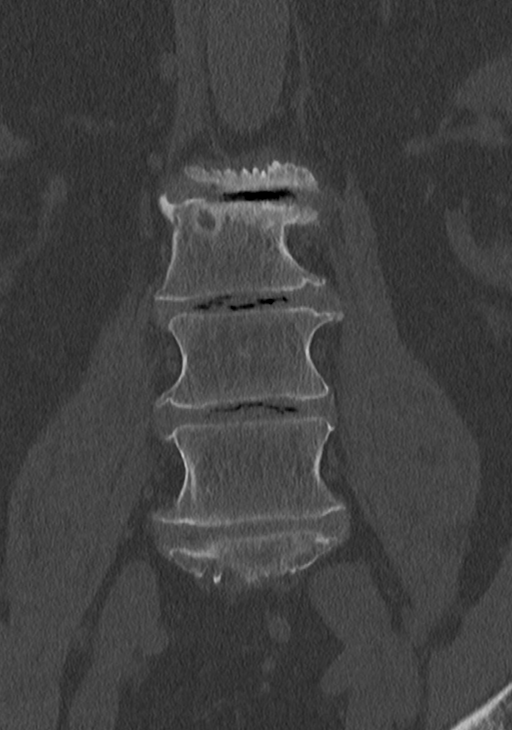
[im 41/62  bone]
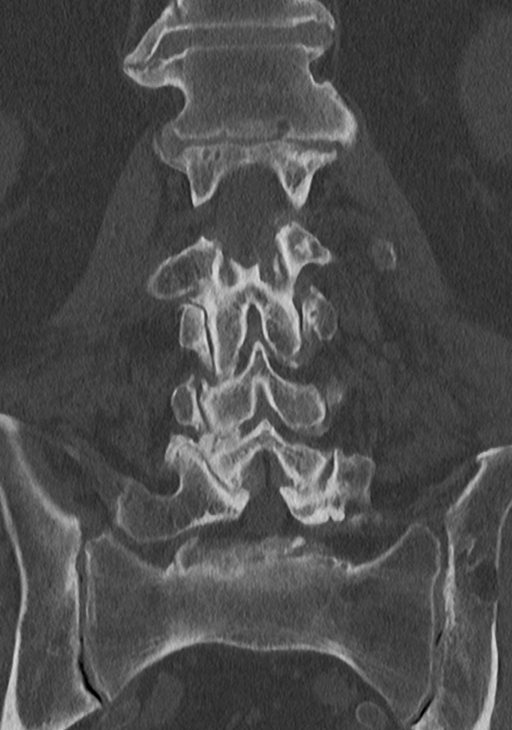

[12 of 33 positions shown; findings below may reference images not displayed]

FINDINGS: Segmentation: 12 paired ribs on thoracic spine CT. This gives 6
lumbar type vertebral bodies, the lowest numbered S1 with open S1-2
interspace

Alignment: Exaggerated lumbar lordosis. Slight retrolisthesis at
L1-2

Vertebrae: No evidence of fracture or bone lesion.

Paraspinal and other soft tissues: Mild atherosclerotic
calcification. No acute finding

Disc levels:

Interspinous space narrowing with intraosseous spinous process
lucency/ganglia seen at L2-3 to L4-5.

L1-L2: Spondylosis and advanced disc narrowing with retrolisthesis
causing mild to moderate foraminal narrowing.

L2-L3: Spondylosis and advanced disc narrowing with endplate
degeneration and gas containing disc cleft. Moderate if not advanced
spinal stenosis. Mild to moderate left foraminal narrowing

L3-L4: Advanced disc narrowing with endplate ridging and bulging.
There is a gas containing disc cleft. Moderate if not advanced
spinal stenosis. Mild-to-moderate bilateral foraminal narrowing

L4-L5: Disc narrowing and endplate ridging with bulge. Posterior
element hypertrophy. Moderate or advanced spinal stenosis on axial
slices

L5-S1: Advanced disc narrowing with endplate ridging. Degenerative
posterior element hypertrophy. Advanced spinal stenosis. There is
gas in the right anterior foramen that degenerative and discogenic.
There is high-grade right foraminal stenosis

S1-2 advanced disc narrowing and endplate degeneration.
IMPRESSION: 1. Transitional S1 vertebra.
2. Advanced and diffuse degenerative disease. There is exaggerated
lumbar lordosis with findings of Baastrup's disease at L2-3 to L4-5.
3. Moderate to advanced spinal stenosis from L2-3 to L5-S1, advanced
at L5-S1.
4. Generalized foraminal narrowing greatest on the right at L5-S1.

## 2019-04-29 IMAGING — CT CT T SPINE W/O CM
3 of 4 series · 11 of 33 positions shown, 13 images · non-contrast
Comparison: None.

CLINICAL DATA: Stiffness in the back with pain in the shoulder
blades

EXAM:
CT THORACIC SPINE WITHOUT CONTRAST
TECHNIQUE: Multidetector CT images of the thoracic were obtained using the
standard protocol without intravenous contrast.

[Series 4: t-spine 2.00 br40 s3 ax · axial · 0.40mm/px · z∈[+1767,+2015]mm · 3 of 188 slices shown, 4 images]
[im 32/188  soft-tissue]
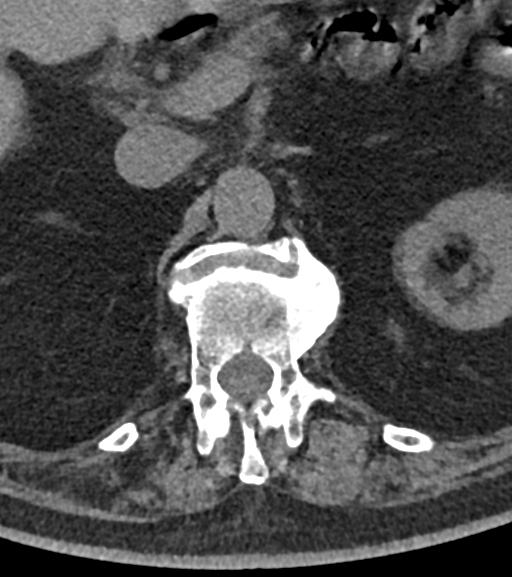
[im 32/188  bone]
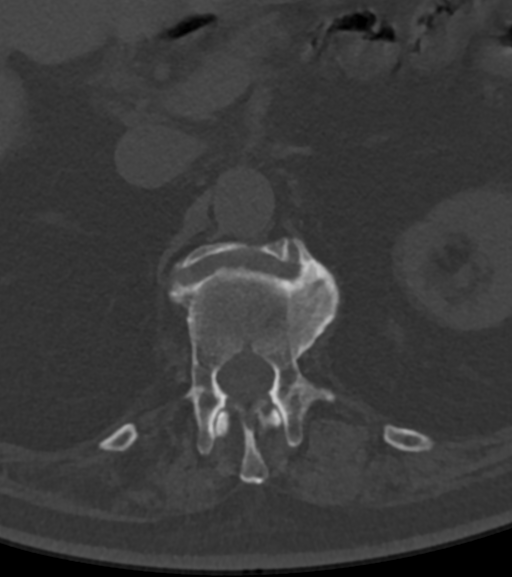
[im 94/188  bone]
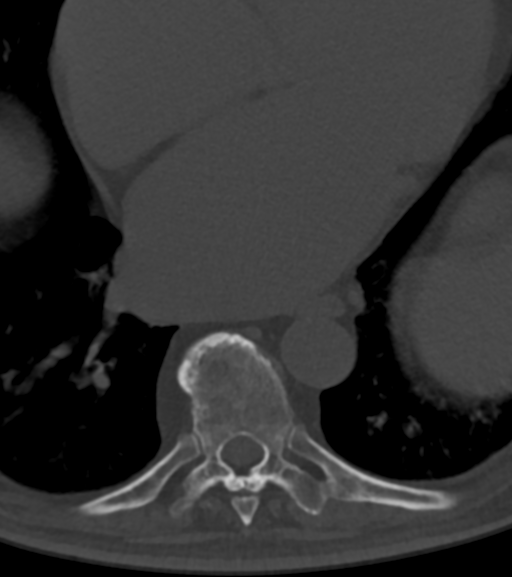
[im 156/188  bone]
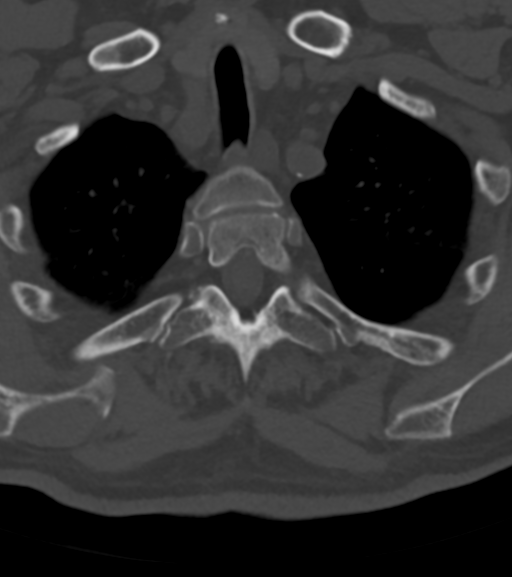

[Series 8: t-spine 2.00 br60 s3 sag · sagittal · 0.45mm/px · 5 of 101 slices shown, 6 images]
[im 34/101  bone]
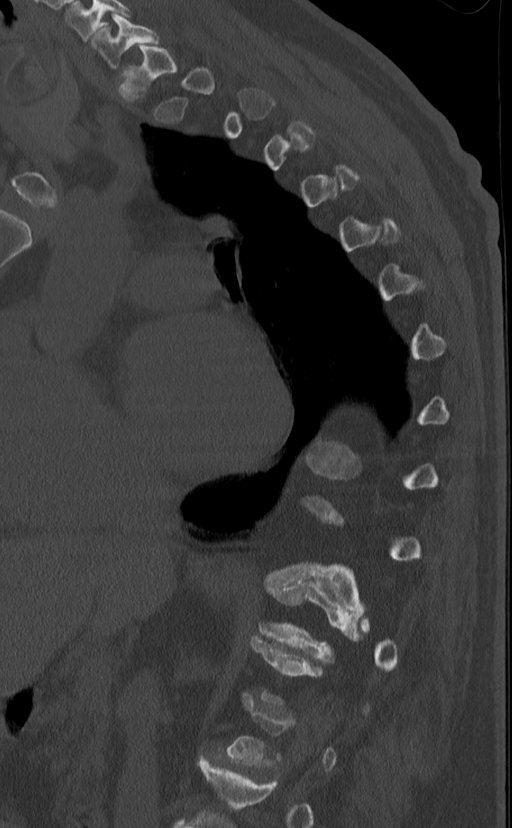
[im 42/101  bone]
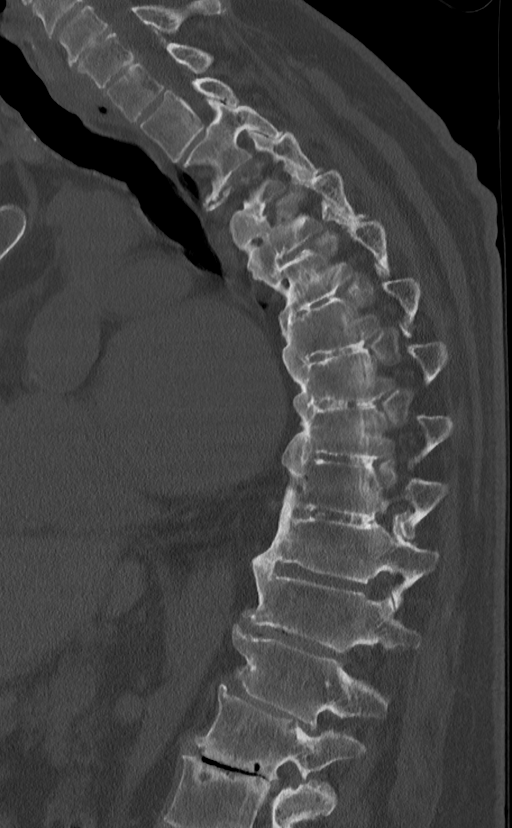
[im 51/101  soft-tissue]
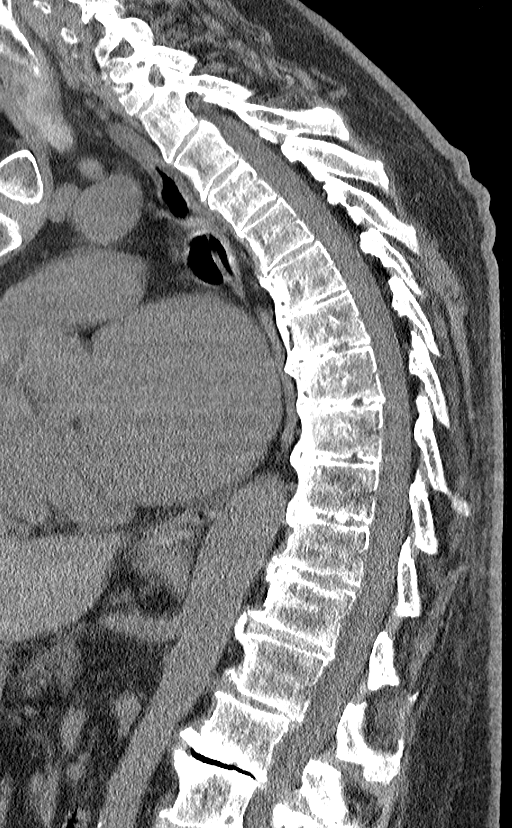
[im 51/101  bone]
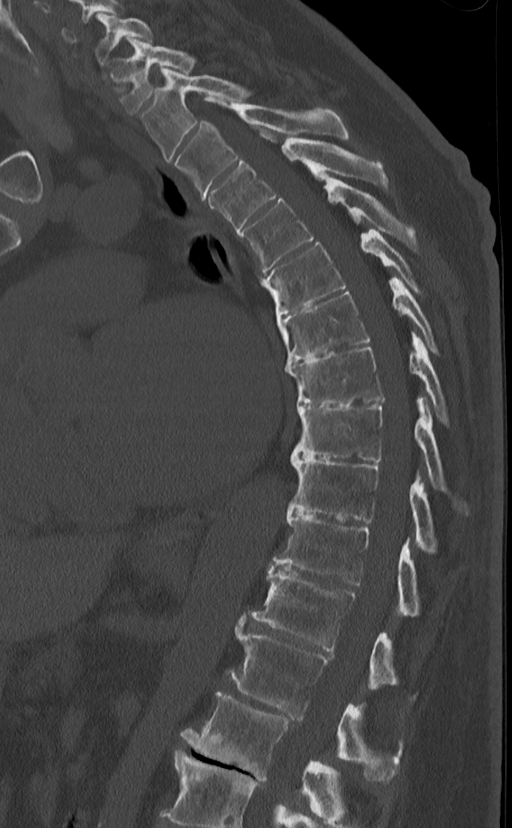
[im 59/101  bone]
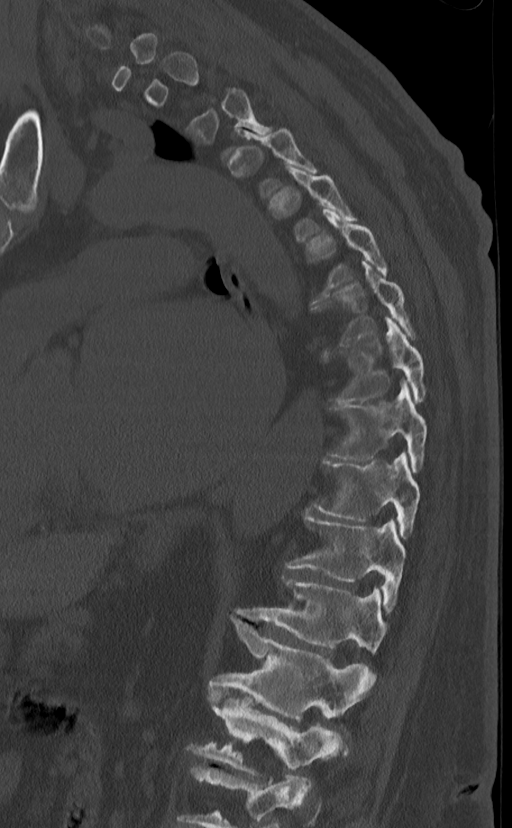
[im 67/101  bone]
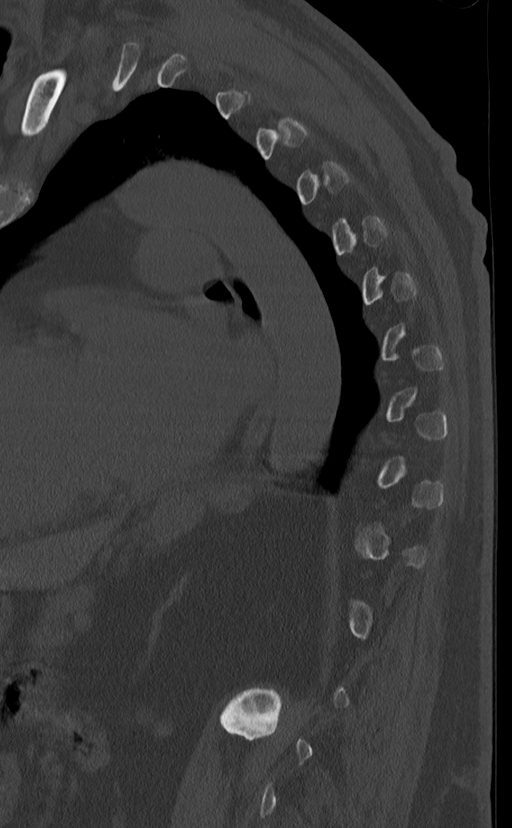

[Series 10: t-spine 2.00 br60 s3 cor · coronal · 0.40mm/px · 3 of 115 slices shown]
[im 23/115  bone]
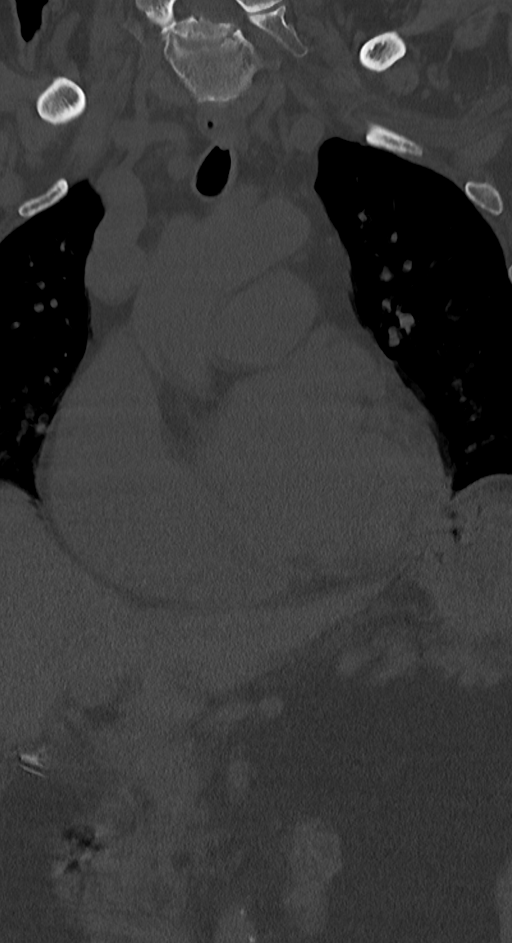
[im 46/115  bone]
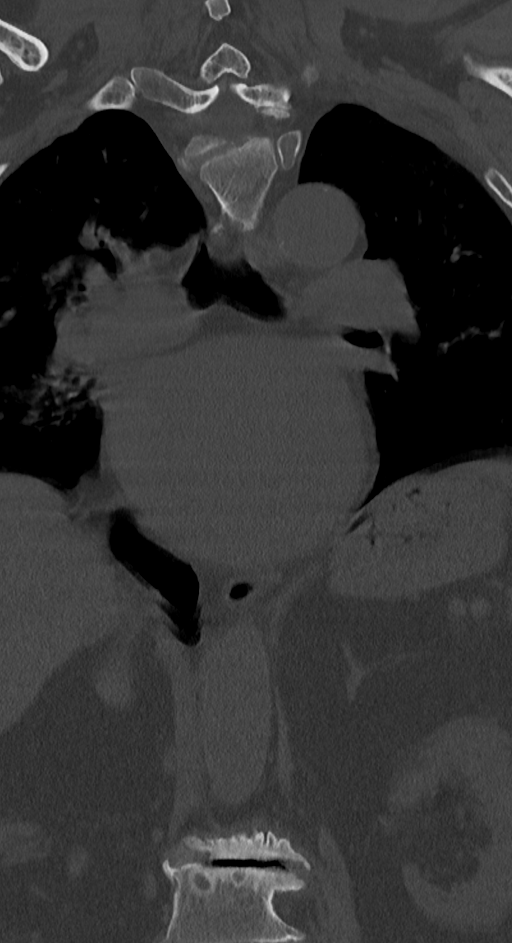
[im 69/115  bone]
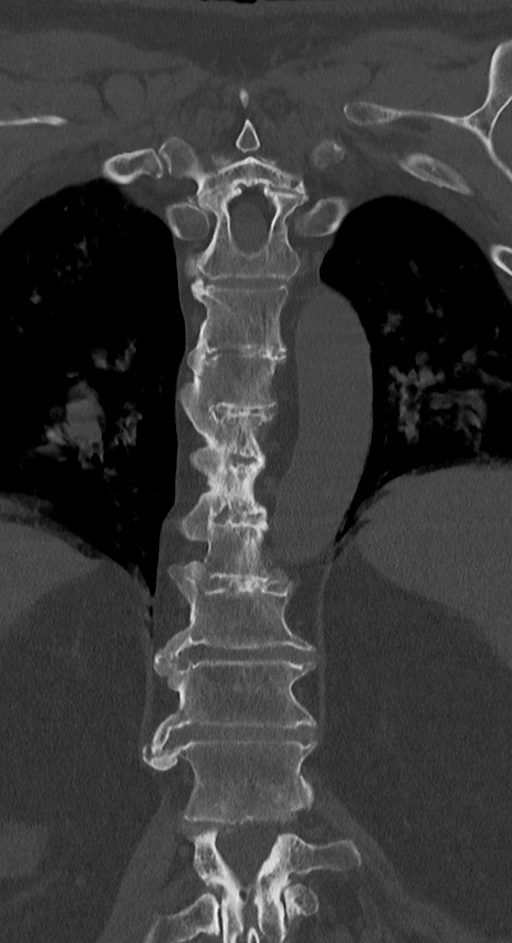

[11 of 33 positions shown; findings below may reference images not displayed]

FINDINGS: Alignment: Exaggerated thoracic kyphosis. No listhesis in the
thoracic spine. Lumbar levels are described separately

Vertebrae: No acute fracture or focal pathologic process.

Paraspinal and other soft tissues: Atherosclerotic calcification.
Cardiomegaly.

Ulceration or other small gas containing defect in the midline
subcutaneous back at the thoracolumbar junction, possibly with
internal packing.

Disc levels: There is flowing osteophytes with ankylosis from
T4-T12. Mild upper thoracic facet spurring most notable at T3-4. No
evidence of degenerative impingement
IMPRESSION: 1. Spondylosis or diffuse idiopathic skeletal hyperostosis with
bridging osteophytes causing ankylosis from T4-T12.
2. No acute finding evidence of degenerative impingement.
3. Small midline skin ulceration or other gas filled defect at the
level of the thoracolumbar junction.

## 2019-05-01 DIAGNOSIS — R293 Abnormal posture: Secondary | ICD-10-CM | POA: Diagnosis not present

## 2019-05-01 DIAGNOSIS — M542 Cervicalgia: Secondary | ICD-10-CM | POA: Diagnosis not present

## 2019-05-09 DIAGNOSIS — M542 Cervicalgia: Secondary | ICD-10-CM | POA: Diagnosis not present

## 2019-05-09 DIAGNOSIS — R293 Abnormal posture: Secondary | ICD-10-CM | POA: Diagnosis not present

## 2019-05-11 NOTE — Progress Notes (Signed)
[place], [place], [place]  [address] Phone: [phone] Fax:  [phone]  Date:  05/16/2019   ID:  [name], [name] Feb 13, 1946, MRN [id number]  PCP:  Buzzy Han, MD  Cardiologist:  Dr. Peter Martinique     History of Present Illness: [name] is a 74 y.o. male seen for follow up Afib. He has a  hx of permanent AFib, mod MR, pulmonary HTN, HTN, HL, T2DM.  Echo (09/2011):  EF 55%, mod MR, severe LAE, severe RAE, PASP 57-61, trivial eff.   He underwent lap cholecystectomy in 2016. In 2018 he was admitted with hyperkalemia and AKI that responded to hydration. He has had hammer toe surgery.  On follow up today he is doing well.  No dizziness or syncope. No SOB or chest pain. Reports coumadin has been doing well. He did have Covid 19 in early January but had a relatively mild case.   Recent Labs: No results found for requested labs within last 8760 hours.  Wt Readings from Last 3 Encounters:  05/16/19 242 lb 9.6 oz (110 kg)  03/30/18 245 lb (111.1 kg)  08/28/16 218 lb 3.2 oz (99 kg)     Past Medical History:  Diagnosis Date  . Atrial fibrillation ([place])   . Chronic anticoagulation   . Complication of anesthesia    ' hard time waking up" per patient   . Diabetes mellitus    type 2  . GERD (gastroesophageal reflux disease)   . Hyperlipidemia   . Hypertension   . Mitral regurgitation    a. Echo (09/2011):  EF 55%, mod MR, severe LAE, severe RAE, PASP 57-61, trivial eff  . Permanent atrial fibrillation ([facility])    chronic atrial fib  . Pulmonary hypertension ([place])   . Pulmonary hypertension ([place])     Current Outpatient Medications  Medication Sig Dispense Refill  . acetaminophen (TYLENOL) 500 MG tablet Take 1,000 mg by mouth every 6 (six) hours as needed for mild pain.    Marland Kitchen allopurinol (ZYLOPRIM) 100 MG tablet TAKE 1 TABLET BY MOUTH TWICE A DAY 60 tablet 1  . baclofen (LIORESAL) 10 MG tablet Take 0.5-1 tablets (5-10 mg total) by mouth 3 (three)  times daily as needed for muscle spasms. 30 each 1  . colchicine 0.6 MG tablet Take 1 tablet (0.6 mg total) by mouth 2 (two) times daily. Take BID for acute gout attack, stop when acute pain resolves 60 tablet 3  . diclofenac (VOLTAREN) 75 MG EC tablet Take 1 tablet (75 mg total) by mouth 2 (two) times daily as needed. 60 tablet 3  . furosemide (LASIX) 20 MG tablet     . Glucose Blood (FREESTYLE LITE TEST VI)     . lidocaine (LIDODERM) 5 % Place 1 patch onto the skin daily. Remove & Discard patch within 12 hours or as directed by MD    . metaxalone (SKELAXIN) 800 MG tablet Take 1 tablet (800 mg total) by mouth 3 (three) times daily as needed for muscle spasms. 90 tablet 1  . methylPREDNISolone (MEDROL DOSEPAK) 4 MG TBPK tablet As directed for 6 days. 21 tablet 0  . metoprolol (LOPRESSOR) 50 MG tablet Take 50 mg by mouth 2 (two) times daily.     Marland Kitchen omeprazole (PRILOSEC) 40 MG capsule Take by mouth daily.  2  . simvastatin (ZOCOR) 20 MG tablet Take 20 mg by mouth at bedtime.    Marland Kitchen tiZANidine (ZANAFLEX) 2 MG tablet Take 1-2 tablets (  2-4 mg total) by mouth every 6 (six) hours as needed for muscle spasms. 60 tablet 1  . trolamine salicylate (ASPERCREME) 10 % cream Apply 1 application topically as needed for muscle pain.    Marland Kitchen warfarin (COUMADIN) 2 MG tablet Take 2 mg by mouth every evening.     No current facility-administered medications for this visit.    Allergies:   Patient has no known allergies.   Social History:  The patient  reports that he has never smoked. He has never used smokeless tobacco. He reports current alcohol use. He reports that he does not use drugs.   Family History:  The patient's family history includes Alzheimer's disease in his mother; Heart disease in his father; Hypertension in his mother.   ROS:  Please see the history of present illness.      All other systems reviewed and negative.   PHYSICAL EXAM: VS:  BP 128/76   Pulse 66   Temp (!) 97.2 F (36.2 C)   Ht 6\' 3"   (1.905 m)   Wt 242 lb 9.6 oz (110 kg)   SpO2 98%   BMI 30.32 kg/m  GENERAL:  Well appearing WM in NAD HEENT:  PERRL, EOMI, sclera are clear. Oropharynx is clear. NECK:  No jugular venous distention, carotid upstroke brisk and symmetric, no bruits, no thyromegaly or adenopathy Back is kyphotic.  HEART:  IRRR,  PMI not displaced or sustained,S1 and S2 within normal limits, no S3, no S4: no clicks, no rubs, 2/6 late systolic systolic murmur at the apex ABD:  Soft, nontender. BS +, no masses or bruits. No hepatomegaly, no splenomegaly EXT:  2 + pulses throughout, no edema, no cyanosis no clubbing SKIN:  Warm and dry.  No rashes NEURO:  Alert and oriented x 3. Cranial nerves II through XII intact. PSYCH:  Cognitively intact    Laboratory data: Lab Results  Component Value Date   WBC 9.6 08/28/2016   HGB 10.4 (L) 08/28/2016   HCT 32.1 (L) 08/28/2016   PLT 170 08/28/2016   GLUCOSE 178 (H) 08/28/2016   ALT 14 (L) 08/27/2016   AST 16 08/27/2016   NA 141 08/28/2016   K 5.6 (H) 08/28/2016   CL 115 (H) 08/28/2016   CREATININE 2.63 (H) 08/28/2016   BUN 65 (H) 08/28/2016   CO2 21 (L) 08/28/2016   INR 1.54 08/27/2016   HGBA1C 6.9 (H) 02/22/2015   Dated 10/19/17: cholesterol 145, triglycerides 125, HDL 38, LDL 82. A1c 7.5%. Hgb 13.1. creatinine 1.71. other chemistries and TSH normal.  EKG:  Today- AFib, HR 66 No acute ST changes. I have personally reviewed and interpreted this study.  Echo 12/24/14: Study Conclusions  - Left ventricle: The cavity size was mildly dilated. Wall   thickness was normal. Systolic function was normal. The estimated   ejection fraction was in the range of 60% to 65%. - Mitral valve: There was moderate regurgitation. - Left atrium: The atrium was severely dilated. - Right ventricle: The cavity size was moderately dilated. Wall   thickness was normal. - Right atrium: The atrium was moderately dilated  ASSESSMENT AND PLAN:  1. Atrial Fibrillation:  Rate is  well controlled on metoprolol.  Coumadin is managed by PCP. He reports INRs have been therapeutic.  2. Mitral Regurgitation: moderate by Echo in 2016.  He is asymptomatic. We will update Echo 3. Hypertension:  BP under good  control  4. Hyperlipidemia:  Continue statin. Reports lab done at [facility] within the last year.  Signed, Peter Martinique MD, [facility]    05/16/2019 10:08 AM

## 2019-05-15 DIAGNOSIS — R293 Abnormal posture: Secondary | ICD-10-CM | POA: Diagnosis not present

## 2019-05-15 DIAGNOSIS — M542 Cervicalgia: Secondary | ICD-10-CM | POA: Diagnosis not present

## 2019-05-16 ENCOUNTER — Ambulatory Visit (INDEPENDENT_AMBULATORY_CARE_PROVIDER_SITE_OTHER): Payer: Medicare Other | Admitting: Cardiology

## 2019-05-16 ENCOUNTER — Other Ambulatory Visit: Payer: Self-pay

## 2019-05-16 ENCOUNTER — Encounter: Payer: Self-pay | Admitting: Cardiology

## 2019-05-16 VITALS — BP 128/76 | HR 66 | Temp 97.2°F | Ht 75.0 in | Wt 242.6 lb

## 2019-05-16 DIAGNOSIS — I482 Chronic atrial fibrillation, unspecified: Secondary | ICD-10-CM

## 2019-05-16 DIAGNOSIS — I340 Nonrheumatic mitral (valve) insufficiency: Secondary | ICD-10-CM | POA: Diagnosis not present

## 2019-05-16 DIAGNOSIS — I10 Essential (primary) hypertension: Secondary | ICD-10-CM | POA: Diagnosis not present

## 2019-05-16 NOTE — Patient Instructions (Signed)
Medication Instructions:  Continue same medications *If you need a refill on your cardiac medications before your next appointment, please call your pharmacy*   Lab Work: None ordered    Testing/Procedures: Schedule Echo  Follow-Up: At [facility], you and your health needs are our priority.  As part of our continuing mission to provide you with exceptional heart care, we have created designated Provider Care Teams.  These Care Teams include your primary Cardiologist (physician) and Advanced Practice Providers (APPs -  Physician Assistants and Nurse Practitioners) who all work together to provide you with the care you need, when you need it.  We recommend signing up for the patient portal called "MyChart".  Sign up information is provided on this After Visit Summary.  MyChart is used to connect with patients for Virtual Visits (Telemedicine).  Patients are able to view lab/test results, encounter notes, upcoming appointments, etc.  Non-urgent messages can be sent to your provider as well.   To learn more about what you can do with MyChart, go to [email or web address].    Your next appointment: 1 Year  (Call in Nov to schedule March appointment)  The format for your next appointment: Office    Provider:  Dr.Jordan

## 2019-05-22 DIAGNOSIS — D485 Neoplasm of uncertain behavior of skin: Secondary | ICD-10-CM | POA: Diagnosis not present

## 2019-05-22 DIAGNOSIS — G5610 Other lesions of median nerve, unspecified upper limb: Secondary | ICD-10-CM | POA: Diagnosis not present

## 2019-05-22 DIAGNOSIS — D631 Anemia in chronic kidney disease: Secondary | ICD-10-CM | POA: Diagnosis not present

## 2019-05-22 DIAGNOSIS — N183 Chronic kidney disease, stage 3 unspecified: Secondary | ICD-10-CM | POA: Diagnosis not present

## 2019-05-22 DIAGNOSIS — N2581 Secondary hyperparathyroidism of renal origin: Secondary | ICD-10-CM | POA: Diagnosis not present

## 2019-05-22 DIAGNOSIS — B079 Viral wart, unspecified: Secondary | ICD-10-CM | POA: Diagnosis not present

## 2019-05-22 DIAGNOSIS — I272 Pulmonary hypertension, unspecified: Secondary | ICD-10-CM | POA: Diagnosis not present

## 2019-05-22 DIAGNOSIS — I4891 Unspecified atrial fibrillation: Secondary | ICD-10-CM | POA: Diagnosis not present

## 2019-05-22 DIAGNOSIS — Z7901 Long term (current) use of anticoagulants: Secondary | ICD-10-CM | POA: Diagnosis not present

## 2019-05-22 DIAGNOSIS — Z0000 Encounter for general adult medical examination without abnormal findings: Secondary | ICD-10-CM | POA: Diagnosis not present

## 2019-05-22 DIAGNOSIS — I700 Atherosclerosis of aorta: Secondary | ICD-10-CM | POA: Diagnosis not present

## 2019-05-22 DIAGNOSIS — D6869 Other thrombophilia: Secondary | ICD-10-CM | POA: Diagnosis not present

## 2019-05-26 DIAGNOSIS — M542 Cervicalgia: Secondary | ICD-10-CM | POA: Diagnosis not present

## 2019-05-26 DIAGNOSIS — R293 Abnormal posture: Secondary | ICD-10-CM | POA: Diagnosis not present

## 2019-05-30 DIAGNOSIS — M542 Cervicalgia: Secondary | ICD-10-CM | POA: Diagnosis not present

## 2019-05-30 DIAGNOSIS — R293 Abnormal posture: Secondary | ICD-10-CM | POA: Diagnosis not present

## 2019-05-31 ENCOUNTER — Other Ambulatory Visit: Payer: Self-pay

## 2019-05-31 ENCOUNTER — Ambulatory Visit (HOSPITAL_COMMUNITY): Payer: Medicare Other | Attending: Internal Medicine

## 2019-05-31 DIAGNOSIS — I10 Essential (primary) hypertension: Secondary | ICD-10-CM | POA: Diagnosis not present

## 2019-05-31 DIAGNOSIS — I340 Nonrheumatic mitral (valve) insufficiency: Secondary | ICD-10-CM | POA: Insufficient documentation

## 2019-05-31 DIAGNOSIS — I482 Chronic atrial fibrillation, unspecified: Secondary | ICD-10-CM | POA: Diagnosis not present

## 2019-05-31 MED ORDER — PERFLUTREN LIPID MICROSPHERE
1.0000 mL | INTRAVENOUS | Status: AC | PRN
  Administered 2019-05-31: 2 mL via INTRAVENOUS

## 2019-06-02 ENCOUNTER — Other Ambulatory Visit: Payer: Self-pay

## 2019-06-02 DIAGNOSIS — E1122 Type 2 diabetes mellitus with diabetic chronic kidney disease: Secondary | ICD-10-CM

## 2019-06-02 DIAGNOSIS — E785 Hyperlipidemia, unspecified: Secondary | ICD-10-CM

## 2019-06-02 DIAGNOSIS — I482 Chronic atrial fibrillation, unspecified: Secondary | ICD-10-CM

## 2019-06-02 DIAGNOSIS — I10 Essential (primary) hypertension: Secondary | ICD-10-CM

## 2019-06-02 DIAGNOSIS — I340 Nonrheumatic mitral (valve) insufficiency: Secondary | ICD-10-CM

## 2019-06-13 DIAGNOSIS — M542 Cervicalgia: Secondary | ICD-10-CM | POA: Diagnosis not present

## 2019-06-13 DIAGNOSIS — R293 Abnormal posture: Secondary | ICD-10-CM | POA: Diagnosis not present

## 2019-07-25 DIAGNOSIS — D485 Neoplasm of uncertain behavior of skin: Secondary | ICD-10-CM | POA: Diagnosis not present

## 2019-07-25 DIAGNOSIS — I4891 Unspecified atrial fibrillation: Secondary | ICD-10-CM | POA: Diagnosis not present

## 2019-07-25 DIAGNOSIS — M542 Cervicalgia: Secondary | ICD-10-CM | POA: Diagnosis not present

## 2019-07-25 DIAGNOSIS — T148XXA Other injury of unspecified body region, initial encounter: Secondary | ICD-10-CM | POA: Diagnosis not present

## 2019-07-27 DIAGNOSIS — B351 Tinea unguium: Secondary | ICD-10-CM | POA: Diagnosis not present

## 2019-08-31 ENCOUNTER — Other Ambulatory Visit: Payer: Self-pay | Admitting: Family Medicine

## 2019-08-31 DIAGNOSIS — I4891 Unspecified atrial fibrillation: Secondary | ICD-10-CM | POA: Diagnosis not present

## 2019-08-31 DIAGNOSIS — M792 Neuralgia and neuritis, unspecified: Secondary | ICD-10-CM | POA: Diagnosis not present

## 2019-08-31 DIAGNOSIS — E118 Type 2 diabetes mellitus with unspecified complications: Secondary | ICD-10-CM | POA: Diagnosis not present

## 2019-08-31 DIAGNOSIS — Z5181 Encounter for therapeutic drug level monitoring: Secondary | ICD-10-CM | POA: Diagnosis not present

## 2019-08-31 DIAGNOSIS — M62541 Muscle wasting and atrophy, not elsewhere classified, right hand: Secondary | ICD-10-CM | POA: Diagnosis not present

## 2019-08-31 DIAGNOSIS — T148XXA Other injury of unspecified body region, initial encounter: Secondary | ICD-10-CM | POA: Diagnosis not present

## 2019-08-31 DIAGNOSIS — D485 Neoplasm of uncertain behavior of skin: Secondary | ICD-10-CM | POA: Diagnosis not present

## 2019-08-31 DIAGNOSIS — K529 Noninfective gastroenteritis and colitis, unspecified: Secondary | ICD-10-CM | POA: Diagnosis not present

## 2019-08-31 DIAGNOSIS — N183 Chronic kidney disease, stage 3 unspecified: Secondary | ICD-10-CM | POA: Diagnosis not present

## 2019-08-31 DIAGNOSIS — G5600 Carpal tunnel syndrome, unspecified upper limb: Secondary | ICD-10-CM | POA: Insufficient documentation

## 2019-08-31 DIAGNOSIS — N2581 Secondary hyperparathyroidism of renal origin: Secondary | ICD-10-CM | POA: Diagnosis not present

## 2019-09-04 ENCOUNTER — Other Ambulatory Visit: Payer: Self-pay | Admitting: Family Medicine

## 2019-09-04 DIAGNOSIS — G5601 Carpal tunnel syndrome, right upper limb: Secondary | ICD-10-CM

## 2019-09-04 DIAGNOSIS — M542 Cervicalgia: Secondary | ICD-10-CM

## 2019-09-12 DIAGNOSIS — D492 Neoplasm of unspecified behavior of bone, soft tissue, and skin: Secondary | ICD-10-CM | POA: Insufficient documentation

## 2019-09-12 DIAGNOSIS — L570 Actinic keratosis: Secondary | ICD-10-CM | POA: Diagnosis not present

## 2019-10-07 ENCOUNTER — Ambulatory Visit
Admission: RE | Admit: 2019-10-07 | Discharge: 2019-10-07 | Disposition: A | Discharge status: Home / Self Care | Payer: Medicare Other | Source: Ambulatory Visit | Attending: Family Medicine | Admitting: Family Medicine

## 2019-10-07 DIAGNOSIS — M4802 Spinal stenosis, cervical region: Secondary | ICD-10-CM | POA: Diagnosis not present

## 2019-10-07 DIAGNOSIS — M542 Cervicalgia: Secondary | ICD-10-CM

## 2019-10-07 DIAGNOSIS — G5601 Carpal tunnel syndrome, right upper limb: Secondary | ICD-10-CM

## 2019-10-11 DIAGNOSIS — M5030 Other cervical disc degeneration, unspecified cervical region: Secondary | ICD-10-CM | POA: Insufficient documentation

## 2019-10-30 DIAGNOSIS — B351 Tinea unguium: Secondary | ICD-10-CM | POA: Diagnosis not present

## 2019-11-14 DIAGNOSIS — D631 Anemia in chronic kidney disease: Secondary | ICD-10-CM | POA: Diagnosis not present

## 2019-11-14 DIAGNOSIS — I129 Hypertensive chronic kidney disease with stage 1 through stage 4 chronic kidney disease, or unspecified chronic kidney disease: Secondary | ICD-10-CM | POA: Diagnosis not present

## 2019-11-14 DIAGNOSIS — N183 Chronic kidney disease, stage 3 unspecified: Secondary | ICD-10-CM | POA: Diagnosis not present

## 2019-11-14 DIAGNOSIS — N189 Chronic kidney disease, unspecified: Secondary | ICD-10-CM | POA: Diagnosis not present

## 2019-11-14 DIAGNOSIS — N2581 Secondary hyperparathyroidism of renal origin: Secondary | ICD-10-CM | POA: Diagnosis not present

## 2019-11-17 DIAGNOSIS — M4802 Spinal stenosis, cervical region: Secondary | ICD-10-CM | POA: Diagnosis not present

## 2019-11-17 DIAGNOSIS — M542 Cervicalgia: Secondary | ICD-10-CM | POA: Diagnosis not present

## 2019-11-17 DIAGNOSIS — M5030 Other cervical disc degeneration, unspecified cervical region: Secondary | ICD-10-CM | POA: Diagnosis not present

## 2019-11-17 DIAGNOSIS — G5621 Lesion of ulnar nerve, right upper limb: Secondary | ICD-10-CM | POA: Diagnosis not present

## 2019-11-30 DIAGNOSIS — N521 Erectile dysfunction due to diseases classified elsewhere: Secondary | ICD-10-CM | POA: Insufficient documentation

## 2019-12-11 DIAGNOSIS — G5621 Lesion of ulnar nerve, right upper limb: Secondary | ICD-10-CM | POA: Diagnosis not present

## 2019-12-13 DIAGNOSIS — K219 Gastro-esophageal reflux disease without esophagitis: Secondary | ICD-10-CM | POA: Diagnosis not present

## 2019-12-13 DIAGNOSIS — E118 Type 2 diabetes mellitus with unspecified complications: Secondary | ICD-10-CM | POA: Diagnosis not present

## 2019-12-13 DIAGNOSIS — N521 Erectile dysfunction due to diseases classified elsewhere: Secondary | ICD-10-CM | POA: Diagnosis not present

## 2019-12-13 DIAGNOSIS — L989 Disorder of the skin and subcutaneous tissue, unspecified: Secondary | ICD-10-CM | POA: Diagnosis not present

## 2019-12-13 DIAGNOSIS — Z23 Encounter for immunization: Secondary | ICD-10-CM | POA: Diagnosis not present

## 2019-12-13 DIAGNOSIS — M62541 Muscle wasting and atrophy, not elsewhere classified, right hand: Secondary | ICD-10-CM | POA: Diagnosis not present

## 2019-12-25 DIAGNOSIS — E118 Type 2 diabetes mellitus with unspecified complications: Secondary | ICD-10-CM | POA: Diagnosis not present

## 2019-12-25 DIAGNOSIS — D485 Neoplasm of uncertain behavior of skin: Secondary | ICD-10-CM | POA: Diagnosis not present

## 2019-12-25 DIAGNOSIS — C4491 Basal cell carcinoma of skin, unspecified: Secondary | ICD-10-CM | POA: Diagnosis not present

## 2019-12-26 DIAGNOSIS — M5030 Other cervical disc degeneration, unspecified cervical region: Secondary | ICD-10-CM | POA: Diagnosis not present

## 2019-12-26 DIAGNOSIS — G5621 Lesion of ulnar nerve, right upper limb: Secondary | ICD-10-CM | POA: Diagnosis not present

## 2019-12-26 DIAGNOSIS — M4802 Spinal stenosis, cervical region: Secondary | ICD-10-CM | POA: Diagnosis not present

## 2019-12-26 DIAGNOSIS — M542 Cervicalgia: Secondary | ICD-10-CM | POA: Diagnosis not present

## 2019-12-26 DIAGNOSIS — G5601 Carpal tunnel syndrome, right upper limb: Secondary | ICD-10-CM | POA: Diagnosis not present

## 2020-01-01 DIAGNOSIS — C44211 Basal cell carcinoma of skin of unspecified ear and external auricular canal: Secondary | ICD-10-CM | POA: Insufficient documentation

## 2020-01-16 DIAGNOSIS — G5601 Carpal tunnel syndrome, right upper limb: Secondary | ICD-10-CM | POA: Diagnosis not present

## 2020-01-16 DIAGNOSIS — G5621 Lesion of ulnar nerve, right upper limb: Secondary | ICD-10-CM | POA: Diagnosis not present

## 2020-01-25 DIAGNOSIS — G5621 Lesion of ulnar nerve, right upper limb: Secondary | ICD-10-CM | POA: Diagnosis not present

## 2020-01-25 DIAGNOSIS — G5601 Carpal tunnel syndrome, right upper limb: Secondary | ICD-10-CM | POA: Diagnosis not present

## 2020-01-29 DIAGNOSIS — B351 Tinea unguium: Secondary | ICD-10-CM | POA: Diagnosis not present

## 2020-02-01 DIAGNOSIS — G5621 Lesion of ulnar nerve, right upper limb: Secondary | ICD-10-CM | POA: Diagnosis not present

## 2020-02-01 DIAGNOSIS — G5601 Carpal tunnel syndrome, right upper limb: Secondary | ICD-10-CM | POA: Diagnosis not present

## 2020-02-02 DIAGNOSIS — G5621 Lesion of ulnar nerve, right upper limb: Secondary | ICD-10-CM | POA: Diagnosis not present

## 2020-02-02 DIAGNOSIS — G5601 Carpal tunnel syndrome, right upper limb: Secondary | ICD-10-CM | POA: Diagnosis not present

## 2020-02-06 DIAGNOSIS — G5601 Carpal tunnel syndrome, right upper limb: Secondary | ICD-10-CM | POA: Diagnosis not present

## 2020-02-06 DIAGNOSIS — G5621 Lesion of ulnar nerve, right upper limb: Secondary | ICD-10-CM | POA: Diagnosis not present

## 2020-02-13 DIAGNOSIS — G5621 Lesion of ulnar nerve, right upper limb: Secondary | ICD-10-CM | POA: Diagnosis not present

## 2020-02-13 DIAGNOSIS — G5601 Carpal tunnel syndrome, right upper limb: Secondary | ICD-10-CM | POA: Diagnosis not present

## 2020-02-16 DIAGNOSIS — G5621 Lesion of ulnar nerve, right upper limb: Secondary | ICD-10-CM | POA: Diagnosis not present

## 2020-02-16 DIAGNOSIS — G5601 Carpal tunnel syndrome, right upper limb: Secondary | ICD-10-CM | POA: Diagnosis not present

## 2020-02-27 DIAGNOSIS — M5030 Other cervical disc degeneration, unspecified cervical region: Secondary | ICD-10-CM | POA: Diagnosis not present

## 2020-02-27 DIAGNOSIS — G5621 Lesion of ulnar nerve, right upper limb: Secondary | ICD-10-CM | POA: Diagnosis not present

## 2020-02-27 DIAGNOSIS — G5601 Carpal tunnel syndrome, right upper limb: Secondary | ICD-10-CM | POA: Diagnosis not present

## 2020-02-27 DIAGNOSIS — M4802 Spinal stenosis, cervical region: Secondary | ICD-10-CM | POA: Diagnosis not present

## 2020-03-01 DIAGNOSIS — G5621 Lesion of ulnar nerve, right upper limb: Secondary | ICD-10-CM | POA: Diagnosis not present

## 2020-03-01 DIAGNOSIS — G5601 Carpal tunnel syndrome, right upper limb: Secondary | ICD-10-CM | POA: Diagnosis not present

## 2020-03-13 DIAGNOSIS — C44219 Basal cell carcinoma of skin of left ear and external auricular canal: Secondary | ICD-10-CM | POA: Diagnosis not present

## 2020-03-26 DIAGNOSIS — I272 Pulmonary hypertension, unspecified: Secondary | ICD-10-CM | POA: Diagnosis not present

## 2020-03-26 DIAGNOSIS — Z85828 Personal history of other malignant neoplasm of skin: Secondary | ICD-10-CM | POA: Diagnosis not present

## 2020-03-26 DIAGNOSIS — D6869 Other thrombophilia: Secondary | ICD-10-CM | POA: Diagnosis not present

## 2020-03-26 DIAGNOSIS — L97919 Non-pressure chronic ulcer of unspecified part of right lower leg with unspecified severity: Secondary | ICD-10-CM | POA: Diagnosis not present

## 2020-03-26 DIAGNOSIS — N2581 Secondary hyperparathyroidism of renal origin: Secondary | ICD-10-CM | POA: Diagnosis not present

## 2020-03-26 DIAGNOSIS — I700 Atherosclerosis of aorta: Secondary | ICD-10-CM | POA: Diagnosis not present

## 2020-03-26 DIAGNOSIS — I4891 Unspecified atrial fibrillation: Secondary | ICD-10-CM | POA: Diagnosis not present

## 2020-03-26 DIAGNOSIS — E1122 Type 2 diabetes mellitus with diabetic chronic kidney disease: Secondary | ICD-10-CM | POA: Diagnosis not present

## 2020-03-26 DIAGNOSIS — L97909 Non-pressure chronic ulcer of unspecified part of unspecified lower leg with unspecified severity: Secondary | ICD-10-CM | POA: Insufficient documentation

## 2020-03-26 DIAGNOSIS — N183 Chronic kidney disease, stage 3 unspecified: Secondary | ICD-10-CM | POA: Diagnosis not present

## 2020-03-26 DIAGNOSIS — E118 Type 2 diabetes mellitus with unspecified complications: Secondary | ICD-10-CM | POA: Diagnosis not present

## 2020-03-26 DIAGNOSIS — Z5181 Encounter for therapeutic drug level monitoring: Secondary | ICD-10-CM | POA: Diagnosis not present

## 2020-03-26 DIAGNOSIS — D631 Anemia in chronic kidney disease: Secondary | ICD-10-CM | POA: Diagnosis not present

## 2020-04-03 DIAGNOSIS — I4891 Unspecified atrial fibrillation: Secondary | ICD-10-CM | POA: Diagnosis not present

## 2020-04-03 DIAGNOSIS — R600 Localized edema: Secondary | ICD-10-CM | POA: Diagnosis not present

## 2020-04-03 DIAGNOSIS — L03115 Cellulitis of right lower limb: Secondary | ICD-10-CM | POA: Diagnosis not present

## 2020-04-03 DIAGNOSIS — E118 Type 2 diabetes mellitus with unspecified complications: Secondary | ICD-10-CM | POA: Diagnosis not present

## 2020-04-09 DIAGNOSIS — I872 Venous insufficiency (chronic) (peripheral): Secondary | ICD-10-CM | POA: Diagnosis not present

## 2020-05-14 DIAGNOSIS — I872 Venous insufficiency (chronic) (peripheral): Secondary | ICD-10-CM | POA: Diagnosis not present

## 2020-05-27 DIAGNOSIS — B351 Tinea unguium: Secondary | ICD-10-CM | POA: Diagnosis not present

## 2020-05-27 DIAGNOSIS — L97521 Non-pressure chronic ulcer of other part of left foot limited to breakdown of skin: Secondary | ICD-10-CM | POA: Diagnosis not present

## 2020-05-31 ENCOUNTER — Ambulatory Visit (HOSPITAL_COMMUNITY): Payer: Medicare Other | Attending: Internal Medicine

## 2020-05-31 ENCOUNTER — Other Ambulatory Visit: Payer: Self-pay

## 2020-05-31 DIAGNOSIS — E1122 Type 2 diabetes mellitus with diabetic chronic kidney disease: Secondary | ICD-10-CM | POA: Diagnosis not present

## 2020-05-31 DIAGNOSIS — I482 Chronic atrial fibrillation, unspecified: Secondary | ICD-10-CM | POA: Insufficient documentation

## 2020-05-31 DIAGNOSIS — I340 Nonrheumatic mitral (valve) insufficiency: Secondary | ICD-10-CM | POA: Insufficient documentation

## 2020-05-31 DIAGNOSIS — I10 Essential (primary) hypertension: Secondary | ICD-10-CM | POA: Diagnosis not present

## 2020-05-31 DIAGNOSIS — E785 Hyperlipidemia, unspecified: Secondary | ICD-10-CM | POA: Insufficient documentation

## 2020-05-31 LAB — ECHOCARDIOGRAM COMPLETE
Area-P 1/2: 3.89 cm2
MV M vel: 6.48 m/s
MV Peak grad: 168 mmHg
P 1/2 time: 898 msec
Radius: 1 cm
S' Lateral: 4 cm

## 2020-05-31 MED ORDER — PERFLUTREN LIPID MICROSPHERE
1.0000 mL | INTRAVENOUS | Status: AC | PRN
  Administered 2020-05-31: 1 mL via INTRAVENOUS

## 2020-06-04 DIAGNOSIS — M7751 Other enthesopathy of right foot: Secondary | ICD-10-CM | POA: Diagnosis not present

## 2020-06-04 DIAGNOSIS — L97521 Non-pressure chronic ulcer of other part of left foot limited to breakdown of skin: Secondary | ICD-10-CM | POA: Diagnosis not present

## 2020-06-04 DIAGNOSIS — M7752 Other enthesopathy of left foot: Secondary | ICD-10-CM | POA: Diagnosis not present

## 2020-06-12 DIAGNOSIS — R3915 Urgency of urination: Secondary | ICD-10-CM | POA: Diagnosis not present

## 2020-06-12 DIAGNOSIS — N430 Encysted hydrocele: Secondary | ICD-10-CM | POA: Diagnosis not present

## 2020-06-12 DIAGNOSIS — R351 Nocturia: Secondary | ICD-10-CM | POA: Diagnosis not present

## 2020-06-12 DIAGNOSIS — N5201 Erectile dysfunction due to arterial insufficiency: Secondary | ICD-10-CM | POA: Diagnosis not present

## 2020-06-12 DIAGNOSIS — N401 Enlarged prostate with lower urinary tract symptoms: Secondary | ICD-10-CM | POA: Diagnosis not present

## 2020-06-17 ENCOUNTER — Encounter: Payer: Self-pay | Admitting: Cardiology

## 2020-06-17 ENCOUNTER — Ambulatory Visit (INDEPENDENT_AMBULATORY_CARE_PROVIDER_SITE_OTHER): Payer: Medicare Other | Admitting: Cardiology

## 2020-06-17 ENCOUNTER — Other Ambulatory Visit: Payer: Self-pay

## 2020-06-17 VITALS — BP 151/84 | HR 54 | Ht 75.0 in | Wt 251.0 lb

## 2020-06-17 DIAGNOSIS — I340 Nonrheumatic mitral (valve) insufficiency: Secondary | ICD-10-CM

## 2020-06-17 DIAGNOSIS — I10 Essential (primary) hypertension: Secondary | ICD-10-CM

## 2020-06-17 DIAGNOSIS — I482 Chronic atrial fibrillation, unspecified: Secondary | ICD-10-CM

## 2020-06-17 DIAGNOSIS — E1122 Type 2 diabetes mellitus with diabetic chronic kidney disease: Secondary | ICD-10-CM

## 2020-06-17 NOTE — Progress Notes (Signed)
[name], [address] [place], [place]  [address] Phone: [phone] Fax:  [phone]  Date:  06/17/2020   ID:  [name], DOB Sep 30, 1945, MRN [id number]  PCP:  Buzzy Han, MD  Cardiologist:  Dr. Jed Kutch Martinique     History of Present Illness: [name] is a 74 y.o. male seen for follow up Afib. He has a  hx of permanent AFib, mod MR, pulmonary HTN, HTN, HL, T2DM.  Echo (09/2011):  EF 55%, mod MR, severe LAE, severe RAE, PASP 57-61, trivial eff.   He underwent lap cholecystectomy in 2016. In 2018 he was admitted with hyperkalemia and AKI that responded to hydration.   On follow up today he is doing well.  No dizziness or syncope. No SOB or chest pain. Reports coumadin has been doing well. He did have Covid 19 in early January 2021 but had a relatively mild case.   He states he is doing well. Active doing yard work. Weight has increased by 9 lbs. He has joined a gym and in trying to eat better. Reports BP at home typicall 134/70. Has chronic vertebral problems.   Recent Labs: No results found for requested labs within last 8760 hours.  Wt Readings from Last 3 Encounters:  06/17/20 251 lb (113.9 kg)  05/16/19 242 lb 9.6 oz (110 kg)  03/30/18 245 lb (111.1 kg)     Past Medical History:  Diagnosis Date  . Atrial fibrillation ([place])   . Chronic anticoagulation   . Complication of anesthesia    ' hard time waking up" per patient   . Diabetes mellitus    type 2  . GERD (gastroesophageal reflux disease)   . Hyperlipidemia   . Hypertension   . Mitral regurgitation    a. Echo (09/2011):  EF 55%, mod MR, severe LAE, severe RAE, PASP 57-61, trivial eff  . Permanent atrial fibrillation ([facility])    chronic atrial fib  . Pulmonary hypertension ([place])   . Pulmonary hypertension ([place])     Current Outpatient Medications  Medication Sig Dispense Refill  . acetaminophen (TYLENOL) 500 MG tablet Take 1,000 mg by mouth every 6 (six) hours as needed for mild pain.    Marland Kitchen  allopurinol (ZYLOPRIM) 100 MG tablet TAKE 1 TABLET BY MOUTH TWICE A DAY 60 tablet 1  . furosemide (LASIX) 20 MG tablet     . Glucose Blood (FREESTYLE LITE TEST VI)     . lidocaine (LIDODERM) 5 % Place 1 patch onto the skin daily. Remove & Discard patch within 12 hours or as directed by MD    . metoprolol (LOPRESSOR) 50 MG tablet Take 50 mg by mouth 2 (two) times daily.     Marland Kitchen omeprazole (PRILOSEC) 40 MG capsule Take by mouth daily.  2  . simvastatin (ZOCOR) 20 MG tablet Take 20 mg by mouth at bedtime.    . trolamine salicylate (ASPERCREME) 10 % cream Apply 1 application topically as needed for muscle pain.    Marland Kitchen warfarin (COUMADIN) 2 MG tablet Take 2 mg by mouth every evening.     No current facility-administered medications for this visit.    Allergies:   Patient has no known allergies.   Social History:  The patient  reports that he has never smoked. He has never used smokeless tobacco. He reports current alcohol use. He reports that he does not use drugs.   Family History:  The patient's family history includes Alzheimer's disease in his mother; Heart disease  in his father; Hypertension in his mother.   ROS:  Please see the history of present illness.      All other systems reviewed and negative.   PHYSICAL EXAM: VS:  BP (!) 151/84   Pulse (!) 54   Ht 6\' 3"  (1.905 m)   Wt 251 lb (113.9 kg)   BMI 31.37 kg/m  GENERAL:  Well appearing WM in NAD HEENT:  PERRL, EOMI, sclera are clear. Oropharynx is clear. NECK:  No jugular venous distention, carotid upstroke brisk and symmetric, no bruits, no thyromegaly or adenopathy Back is kyphotic.  HEART:  IRRR,  PMI not displaced or sustained,S1 and S2 within normal limits, no S3, no S4: no clicks, no rubs, 2/6 systolic systolic murmur at the apex ABD:  Soft, nontender. BS +, no masses or bruits. No hepatomegaly, no splenomegaly EXT:  2 + pulses throughout, no edema, no cyanosis no clubbing SKIN:  Warm and dry.  No rashes NEURO:  Alert and  oriented x 3. Cranial nerves II through XII intact. PSYCH:  Cognitively intact    Laboratory data: Lab Results  Component Value Date   WBC 9.6 08/28/2016   HGB 10.4 (L) 08/28/2016   HCT 32.1 (L) 08/28/2016   PLT 170 08/28/2016   GLUCOSE 178 (H) 08/28/2016   ALT 14 (L) 08/27/2016   AST 16 08/27/2016   NA 141 08/28/2016   K 5.6 (H) 08/28/2016   CL 115 (H) 08/28/2016   CREATININE 2.63 (H) 08/28/2016   BUN 65 (H) 08/28/2016   CO2 21 (L) 08/28/2016   INR 1.54 08/27/2016   HGBA1C 6.9 (H) 02/22/2015   Dated 10/19/17: cholesterol 145, triglycerides 125, HDL 38, LDL 82. A1c 7.5%. Hgb 13.1. creatinine 1.71. other chemistries and TSH normal.  EKG:  Today- AFib, HR 54. No acute ST changes. I have personally reviewed and interpreted this study.  Echo 12/24/14: Study Conclusions  - Left ventricle: The cavity size was mildly dilated. Wall   thickness was normal. Systolic function was normal. The estimated   ejection fraction was in the range of 60% to 65%. - Mitral valve: There was moderate regurgitation. - Left atrium: The atrium was severely dilated. - Right ventricle: The cavity size was moderately dilated. Wall   thickness was normal. - Right atrium: The atrium was moderately dilated  Echo 05/31/20: IMPRESSIONS    1. Left ventricular ejection fraction, by estimation, is 60 to 65%. The  left ventricle has normal function. The left ventricle has no regional  wall motion abnormalities. There is mild left ventricular hypertrophy.  Left ventricular diastolic function  could not be evaluated.  2. Right ventricular systolic function is normal. The right ventricular  size is normal. There is severely elevated pulmonary artery systolic  pressure. The estimated right ventricular systolic pressure is 00.8 mmHg.  3. Left atrial size was severely dilated.  4. Right atrial size was moderately dilated.  5. The mitral valve is abnormal. Moderate to severe mitral valve  regurgitation.   6. Eccentric TR jet. The tricuspid valve is abnormal. Tricuspid valve  regurgitation is moderate.  7. The aortic valve is tricuspid. Aortic valve regurgitation is trivial.  8. The inferior vena cava is dilated in size with <50% respiratory  variability, suggesting right atrial pressure of 15 mmHg.   Comparison(s): Changes from prior study are noted. 05/31/19 EF 60-65%.  Moderate-severe MR, RVSP 50 mmHg.   ASSESSMENT AND PLAN:  1. Atrial Fibrillation- chronic and permanent:  Rate is well controlled on metoprolol.  Coumadin is managed by PCP. He reports INRs have been therapeutic.  2. Mitral Regurgitation: moderate- severe. I personally reviewed Echo. 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 He has had AFib since 1980. His LV function is good. He is not really symptomatic. Mild edema. No dyspnea. Will continue current therapy.  3. Hypertension:  BP under good  control per patient report 4. Hyperlipidemia:  Continue statin. Reports lab done at [facility] within the last year.   Signed, Bunyan Brier Martinique MD, [facility]    06/17/2020 9:44 AM

## 2020-06-17 NOTE — Addendum Note (Signed)
Addended by: Kathyrn Lass on: 06/17/2020 10:13 AM   Modules accepted: Orders

## 2020-06-21 ENCOUNTER — Ambulatory Visit: Payer: Medicare Other | Admitting: Cardiology

## 2020-07-01 DIAGNOSIS — M71572 Other bursitis, not elsewhere classified, left ankle and foot: Secondary | ICD-10-CM | POA: Diagnosis not present

## 2020-07-01 DIAGNOSIS — M71571 Other bursitis, not elsewhere classified, right ankle and foot: Secondary | ICD-10-CM | POA: Diagnosis not present

## 2020-07-01 DIAGNOSIS — L97521 Non-pressure chronic ulcer of other part of left foot limited to breakdown of skin: Secondary | ICD-10-CM | POA: Diagnosis not present

## 2020-07-02 DIAGNOSIS — N183 Chronic kidney disease, stage 3 unspecified: Secondary | ICD-10-CM | POA: Diagnosis not present

## 2020-07-11 DIAGNOSIS — D631 Anemia in chronic kidney disease: Secondary | ICD-10-CM | POA: Diagnosis not present

## 2020-07-11 DIAGNOSIS — N2581 Secondary hyperparathyroidism of renal origin: Secondary | ICD-10-CM | POA: Diagnosis not present

## 2020-07-11 DIAGNOSIS — I129 Hypertensive chronic kidney disease with stage 1 through stage 4 chronic kidney disease, or unspecified chronic kidney disease: Secondary | ICD-10-CM | POA: Diagnosis not present

## 2020-07-11 DIAGNOSIS — N1832 Chronic kidney disease, stage 3b: Secondary | ICD-10-CM | POA: Diagnosis not present

## 2020-07-16 DIAGNOSIS — M2580 Other specified joint disorders, unspecified joint: Secondary | ICD-10-CM | POA: Diagnosis not present

## 2020-07-22 DIAGNOSIS — I872 Venous insufficiency (chronic) (peripheral): Secondary | ICD-10-CM | POA: Diagnosis not present

## 2020-07-22 DIAGNOSIS — Z85820 Personal history of malignant melanoma of skin: Secondary | ICD-10-CM | POA: Diagnosis not present

## 2020-07-22 DIAGNOSIS — S61412A Laceration without foreign body of left hand, initial encounter: Secondary | ICD-10-CM | POA: Diagnosis not present

## 2020-07-22 DIAGNOSIS — M549 Dorsalgia, unspecified: Secondary | ICD-10-CM | POA: Diagnosis not present

## 2020-08-05 ENCOUNTER — Ambulatory Visit: Payer: Medicare Other | Admitting: Cardiology

## 2020-08-07 ENCOUNTER — Ambulatory Visit (HOSPITAL_BASED_OUTPATIENT_CLINIC_OR_DEPARTMENT_OTHER): Payer: Medicare Other | Admitting: Family Medicine

## 2020-08-20 DIAGNOSIS — B351 Tinea unguium: Secondary | ICD-10-CM | POA: Diagnosis not present

## 2020-08-28 ENCOUNTER — Ambulatory Visit (INDEPENDENT_AMBULATORY_CARE_PROVIDER_SITE_OTHER): Payer: Medicare Other | Admitting: Family Medicine

## 2020-08-28 ENCOUNTER — Encounter (HOSPITAL_BASED_OUTPATIENT_CLINIC_OR_DEPARTMENT_OTHER): Payer: Self-pay | Admitting: Family Medicine

## 2020-08-28 ENCOUNTER — Other Ambulatory Visit: Payer: Self-pay

## 2020-08-28 VITALS — BP 146/74 | HR 74 | Ht 75.0 in | Wt 253.8 lb

## 2020-08-28 DIAGNOSIS — E1122 Type 2 diabetes mellitus with diabetic chronic kidney disease: Secondary | ICD-10-CM

## 2020-08-28 DIAGNOSIS — M549 Dorsalgia, unspecified: Secondary | ICD-10-CM | POA: Insufficient documentation

## 2020-08-28 DIAGNOSIS — N1832 Chronic kidney disease, stage 3b: Secondary | ICD-10-CM | POA: Diagnosis not present

## 2020-08-28 DIAGNOSIS — M546 Pain in thoracic spine: Secondary | ICD-10-CM

## 2020-08-28 DIAGNOSIS — G8929 Other chronic pain: Secondary | ICD-10-CM

## 2020-08-28 DIAGNOSIS — I4821 Permanent atrial fibrillation: Secondary | ICD-10-CM | POA: Diagnosis not present

## 2020-08-28 DIAGNOSIS — N189 Chronic kidney disease, unspecified: Secondary | ICD-10-CM | POA: Insufficient documentation

## 2020-08-28 DIAGNOSIS — Z7901 Long term (current) use of anticoagulants: Secondary | ICD-10-CM | POA: Diagnosis not present

## 2020-08-28 MED ORDER — TADALAFIL 5 MG PO TABS: 5.0000 mg | ORAL_TABLET | Freq: Every day | ORAL | 1 refills | Status: DC

## 2020-08-28 NOTE — Assessment & Plan Note (Signed)
Maintained on metoprolol for rate control, continue with this Anticoagulation achieved with warfarin, management as per below Continue follow-up with cardiology

## 2020-08-28 NOTE — Progress Notes (Signed)
New Patient Office Visit  Subjective:  Patient ID: [name], male    DOB: 28-May-1945  Age: 75 y.o. MRN: [id number]  CC:  Chief Complaint  Patient presents with   Establish Care    [facility]. Pt sees [facility] (Dr. [place]), [facility] (Dr. Posey Pronto), and [facility]. Has aslso been consulted on by [facility] for a skin cancer removal.   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 He states he's tried a lidocaine patch with minimal relief   Medication Refill    Patient requesting to have Cialis 10 mg refilled. Patient states he has been taking it for increased blood flow and help with leaky urinary issues. He states he noticed a difference in his energy level and resolve with is leaky urination while taking the medication but his prescription has run out. According to the patient this regiment is encouraged by Dr. Posey Pronto (nephrologist)   Coagulation Disorder    Patient is historically prescribed coumadin for afib and states he hasn't had his INR checked in over a month and is requesting to have it check    HPI [name] is a 75 year old male presenting to establish in clinic.  Some questions today regarding refill of tadalafil, back pain, needing INR checked.  He has a past medical history significant for atrial fibrillation, diabetes, hypertension, HLD, chronic kidney disease.  Patient does follow with cardiology and nephrology.  Anticoagulation: Is managed on warfarin for anticoagulation related to underlying A. fib.  Denies any recent changes in dose.  Reports that INR is generally within therapeutic range.  Last check was over a month ago.  Was seeing PCP locally, however reports that staff has been seeming to leave that office and he is not sure if they are even going to remain open.  This is what has led to him establishing with  [place].  Back pain: Reports long history of intermittent back pain.  Indicates that current symptoms are mostly in the mid to upper thoracic region, bilaterally.  Reports he has had some imaging completed in the past including an MRI which did show degenerative changes.  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  Presently denies any issues with numbness or tingling in bilateral upper or lower extremities.  Does have some urinary issues related to BPH, however denies any new or worsening problems with urinary or bowel incontinence.  No weakness or motor issues in any extremity.  BPH: Reports that he had been on tadalafil 5 mg in the past but was not noticing significant benefit from this.  Reports that when he met with his nephrologist, he was advised to increase the dose to 10 mg and this led to notable symptom improvement.  Today he is requesting refill of medication.  Past Medical History:  Diagnosis Date   Atrial fibrillation (HCC)    Chronic anticoagulation    Complication of anesthesia    ' hard time waking up" per patient    Diabetes mellitus    type 2   GERD (gastroesophageal reflux disease)    Hyperlipidemia    Hypertension    Mitral regurgitation    a. Echo (09/2011):  EF 55%, mod MR, severe LAE,  severe RAE, PASP 57-61, trivial eff   Permanent atrial fibrillation (HCC)    chronic atrial fib   Pulmonary hypertension ([place])    Pulmonary hypertension (HCC)     Past Surgical History:  Procedure Laterality Date   CARDIOVERSION     CHOLECYSTECTOMY N/A 03/01/2015   Procedure: LAPAROSCOPIC CHOLECYSTECTOMY;  Surgeon: Ralene Ok, MD;  Location: [facility];  Service: General;  Laterality: N/A;   spur on heel      Family History  Problem Relation Age of Onset   Hypertension Mother    Alzheimer's disease Mother    Heart disease Father        cabgx2, respiratory failure,  pacemaker    Social History   Socioeconomic History   Marital status: Married    Spouse name: Not on file   Number of children: Not on file   Years of education: Not on file   Highest education level: Not on file  Occupational History   Not on file  Tobacco Use   Smoking status: Never   Smokeless tobacco: Never  Vaping Use   Vaping Use: Never used  Substance and Sexual Activity   Alcohol use: Yes    Comment: margarita    Drug use: No   Sexual activity: Not on file  Other Topics Concern   Not on file  Social History Narrative   Not on file   Social Determinants of Health   Financial Resource Strain: Not on file  Food Insecurity: Not on file  Transportation Needs: Not on file  Physical Activity: Not on file  Stress: Not on file  Social Connections: Not on file  Intimate Partner Violence: Not on file    Objective:   Today's Vitals: BP (!) 146/74   Pulse 74   Ht 6' 3" (1.905 m)   Wt 253 lb 12.8 oz (115.1 kg)   SpO2 97%   BMI 31.72 kg/m   Physical Exam  75 year old male in no acute distress Cardiovascular exam with irregularly irregular rhythm, normal rate Lungs clear to auscultation bilaterally  Assessment & Plan:   Problem List Items Addressed This Visit       Cardiovascular and Mediastinum   Permanent atrial fibrillation (HCC) - Primary (Chronic)    Maintained on metoprolol for rate control, continue with this Anticoagulation achieved with warfarin, management as per below Continue follow-up with cardiology       Relevant Medications   tadalafil (CIALIS) 5 MG tablet   Other Relevant Orders   INR/PT     Endocrine   DM2 (diabetes mellitus, type 2) ([place]) (Chronic)    Will obtain records from prior PCP to review notes and lab results Likely plan to check hemoglobin A1c at next visit Continue with lifestyle modifications         Genitourinary   Chronic kidney disease    Obtained most recent labs from nephrologist office, GFR estimated at 44  and patient with presence of severe albuminuria Presence of CKD increases difficulty and risk when prescribing medications to ensure proper dosing and trying to limit potential for adverse effects.  This is particularly the case with tadalafil.  Given current stage of kidney disease, would not want to do greater than 5 mg dose, however patient is indicating that nephrologist did recommend higher dose at 10 mg.  Will request records from nephrology in order to review further.  At this time we will continue with 5 mg tadalafil for now         Other  Chronic anticoagulation    Maintained on warfarin with goal INR of 2.0-3.0 Has not had INR checked and greater than 1 month, will check today Continue with present warfarin dosing, adjustments to be made as needed pending INR result       Back pain    Patient has had prior imaging with MRI completed of cervical spine and CT scans completed of thoracic and lumbar spine.  Imaging does indicate moderate to severe degenerative changes through all segments of the spine and also with presence at various level of central canal stenosis.  Does not currently have symptoms concerning for neurologic impairment based on history or exam. Discussed above findings, recommendations for working with physical therapy in the office as primary issue of bilateral thoracic back pain appears to be primarily muscular in nature.  Patient declines referral at this time Also discussed possibility of referring patient to spine specialist given degree of degenerative changes with presence of moderate to severe canal stenosis at multiple levels.  Patient aware but would also prefer to continue to hold off on this at the present time. Pain control primarily with topical agents, Tylenol.  Patient is aware of needing to avoid NSAID use given his CKD        Outpatient Encounter Medications as of 08/28/2020  Medication Sig   acetaminophen (TYLENOL) 500 MG tablet Take 1,000 mg by  mouth every 6 (six) hours as needed for mild pain.   allopurinol (ZYLOPRIM) 100 MG tablet TAKE 1 TABLET BY MOUTH TWICE A DAY   furosemide (LASIX) 20 MG tablet Take 20 mg by mouth every other day.   Glucose Blood (FREESTYLE LITE TEST VI)    lidocaine (LIDODERM) 5 % Place 1 patch onto the skin daily. Remove & Discard patch within 12 hours or as directed by MD   metoprolol (LOPRESSOR) 50 MG tablet Take 50 mg by mouth 2 (two) times daily.    omeprazole (PRILOSEC) 40 MG capsule Take by mouth daily.   simvastatin (ZOCOR) 20 MG tablet Take 20 mg by mouth at bedtime.   warfarin (COUMADIN) 2 MG tablet Take 2 mg by mouth every evening.   [DISCONTINUED] tadalafil (CIALIS) 5 MG tablet Take 10 mg by mouth daily.   tadalafil (CIALIS) 5 MG tablet Take 1 tablet (5 mg total) by mouth daily.   [DISCONTINUED] trolamine salicylate (ASPERCREME) 10 % cream Apply 1 application topically as needed for muscle pain. (Patient not taking: Reported on 08/28/2020)   No facility-administered encounter medications on file as of 08/28/2020.   Spent 60 minutes on this patient encounter, including preparation, chart review, face-to-face counseling with patient and coordination of care, and documentation of encounter  Follow-up: Return in about 2 months (around 10/28/2020).  Plan for follow-up in about 2 months to follow-up on back pain, diabetes, anticoagulation.  Will likely need updated labs including A1c at next visit.  Saraiyah Hemminger J De [place], MD

## 2020-08-28 NOTE — Assessment & Plan Note (Signed)
Patient has had prior imaging with MRI completed of cervical spine and CT scans completed of thoracic and lumbar spine.  Imaging does indicate moderate to severe degenerative changes through all segments of the spine and also with presence at various level of central canal stenosis.  Does not currently have symptoms concerning for neurologic impairment based on history or exam. Discussed above findings, recommendations for working with physical therapy in the office as primary issue of bilateral thoracic back pain appears to be primarily muscular in nature.  Patient declines referral at this time Also discussed possibility of referring patient to spine specialist given degree of degenerative changes with presence of moderate to severe canal stenosis at multiple levels.  Patient aware but would also prefer to continue to hold off on this at the present time. Pain control primarily with topical agents, Tylenol.  Patient is aware of needing to avoid NSAID use given his CKD

## 2020-08-28 NOTE — Patient Instructions (Signed)
  Medication Instructions:  Your physician has recommended you make the following change in your medication:  -- We will refill Tiladfil 5 mg - Take 1 tablet by mouth daily --If you need a refill on any your medications before your next appointment, please call your pharmacy first. If no refills are authorized on file call the office.--  Lab Work: Your physician has recommended that you have lab work today: INR If you have labs (blood work) drawn today and your tests are completely normal, you will receive your results via [place] a phone call from our staff.  Please ensure you check your voicemail in the event that you authorized detailed messages to be left on a delegated number. If you have any lab test that is abnormal or we need to change your treatment, we will call you to review the results.  Follow-Up: Your next appointment:   Your physician recommends that you schedule a follow-up appointment in: 2 MONTHS with Dr. de [place]  Thanks for letting us be apart of your health journey!!  Primary Care and Sports Medicine   Dr. Arlina Robes [place]   We encourage you to activate your patient portal called "MyChart".  Sign up information is provided on this After Visit Summary.  MyChart is used to connect with patients for Virtual Visits (Telemedicine).  Patients are able to view lab/test results, encounter notes, upcoming appointments, etc.  Non-urgent messages can be sent to your provider as well. To learn more about what you can do with MyChart, please visit --  [email or web address].

## 2020-08-28 NOTE — Assessment & Plan Note (Signed)
Will obtain records from prior PCP to review notes and lab results Likely plan to check hemoglobin A1c at next visit Continue with lifestyle modifications

## 2020-08-28 NOTE — Assessment & Plan Note (Signed)
Obtained most recent labs from nephrologist office, GFR estimated at 43 and patient with presence of severe albuminuria Presence of CKD increases difficulty and risk when prescribing medications to ensure proper dosing and trying to limit potential for adverse effects.  This is particularly the case with tadalafil.  Given current stage of kidney disease, would not want to do greater than 5 mg dose, however patient is indicating that nephrologist did recommend higher dose at 10 mg.  Will request records from nephrology in order to review further.  At this time we will continue with 5 mg tadalafil for now

## 2020-08-28 NOTE — Assessment & Plan Note (Signed)
Maintained on warfarin with goal INR of 2.0-3.0 Has not had INR checked and greater than 1 month, will check today Continue with present warfarin dosing, adjustments to be made as needed pending INR result

## 2020-08-29 LAB — PROTIME-INR
INR: 2.1 — ABNORMAL HIGH (ref 0.9–1.2)
Prothrombin Time: 21.5 s — ABNORMAL HIGH (ref 9.1–12.0)

## 2020-08-30 ENCOUNTER — Telehealth (HOSPITAL_BASED_OUTPATIENT_CLINIC_OR_DEPARTMENT_OTHER): Payer: Self-pay

## 2020-08-30 NOTE — Telephone Encounter (Signed)
-----   Message from Raymond J de [place], MD sent at 08/30/2020  8:34 AM EDT ----- INR is at therapeutic goal.  Continue with same dosing of warfarin.  Plan for recheck with nurse visit in 4 weeks.

## 2020-08-30 NOTE — Telephone Encounter (Signed)
Pt is aware and agreeable to lab results and recommendations Pt will have repeat INR at [place] on 07/13

## 2020-09-25 ENCOUNTER — Ambulatory Visit (INDEPENDENT_AMBULATORY_CARE_PROVIDER_SITE_OTHER): Payer: Medicare Other | Admitting: Family Medicine

## 2020-09-25 ENCOUNTER — Encounter (HOSPITAL_BASED_OUTPATIENT_CLINIC_OR_DEPARTMENT_OTHER): Payer: Self-pay | Admitting: Family Medicine

## 2020-09-25 ENCOUNTER — Other Ambulatory Visit: Payer: Self-pay

## 2020-09-25 VITALS — BP 110/76 | HR 68 | Ht 68.0 in | Wt 254.0 lb

## 2020-09-25 DIAGNOSIS — Z7901 Long term (current) use of anticoagulants: Secondary | ICD-10-CM

## 2020-09-25 DIAGNOSIS — M4800 Spinal stenosis, site unspecified: Secondary | ICD-10-CM | POA: Diagnosis not present

## 2020-09-25 DIAGNOSIS — E1122 Type 2 diabetes mellitus with diabetic chronic kidney disease: Secondary | ICD-10-CM | POA: Diagnosis not present

## 2020-09-25 DIAGNOSIS — R413 Other amnesia: Secondary | ICD-10-CM

## 2020-09-25 DIAGNOSIS — N1832 Chronic kidney disease, stage 3b: Secondary | ICD-10-CM | POA: Diagnosis not present

## 2020-09-25 NOTE — Patient Instructions (Signed)
  Medication Instructions:  Your physician recommends that you continue on your current medications as directed. Please refer to the Current Medication list given to you today. --If you need a refill on any your medications before your next appointment, please call your pharmacy first. If no refills are authorized on file call the office.--  Lab Work: Your physician has recommended that you have lab work today:  If you have labs (blood work) drawn today and your tests are completely normal, you will receive your results via [place] a phone call from our staff.  Please ensure you check your voicemail in the event that you authorized detailed messages to be left on a delegated number. If you have any lab test that is abnormal or we need to change your treatment, we will call you to review the results.  Referrals/Procedures/Imaging: A referral has been placed for you to Neurology for a Neurocognitive evaluation and treatment. Someone from the scheduling department will be in contact with you in regards to coordinating your consultation. If you do not hear from any of the schedulers within 7-10 business days please give our office a call.  A referral has been placed for you to Neurosurgery for evaluation and treatment. Someone from the scheduling department will be in contact with you in regards to coordinating your consultation. If you do not hear from any of the schedulers within 7-10 business days please give our office a call.  Follow-Up: Your next appointment:   Your physician recommends that you schedule a follow-up appointment in: 1 MONTH with Dr. de [place]  Thanks for letting us be apart of your health journey!!  Primary Care and Sports Medicine   Dr. Arlina Robes [place]   We encourage you to activate your patient portal called "MyChart".  Sign up information is provided on this After Visit Summary.  MyChart is used to connect with patients for Virtual Visits (Telemedicine).  Patients  are able to view lab/test results, encounter notes, upcoming appointments, etc.  Non-urgent messages can be sent to your provider as well. To learn more about what you can do with MyChart, please visit --  [email or web address].

## 2020-09-26 DIAGNOSIS — M4800 Spinal stenosis, site unspecified: Secondary | ICD-10-CM | POA: Insufficient documentation

## 2020-09-26 DIAGNOSIS — R413 Other amnesia: Secondary | ICD-10-CM | POA: Insufficient documentation

## 2020-09-26 NOTE — Assessment & Plan Note (Signed)
Patient with known history of spinal stenosis.  Prior MRI of cervical spine in 2021 revealed multilevel cervical degenerative disc disease with severe spinal canal stenosis at C4-5 and moderate spinal canal stenosis at C3-4.  He was also found to have multilevel moderate to severe neuroforaminal stenosis which was worst at right C3-4, bilateral C4-5 and right C6-7.  He also has CT scan of the lumbar spine in 2020 which revealed advanced and diffuse degenerative disease with moderate to advanced spinal stenosis from L2-3 to L5-S1.  Patient has had intermittent back pain for the years and has used various OTC measures including lidocaine patches, topical rubs, or medications.  He denies current or prior issues with motor weakness, no change in sensation.  No bowel or bladder incontinence.  Given continued pain and symptoms however, patient is interested in discussing treatment modalities with neurosurgical specialist. Referral placed to neurosurgery for further discussion of treatment options including localized injection versus surgical interventions.

## 2020-09-26 NOTE — Assessment & Plan Note (Signed)
Currently taking warfarin, due for INR check He does have plans for labs at the [place] later this week, does think that A1c and INR will be checked with his labs Patient will attempt to have lab results forwarded to our office for review If INR is not checked, will do so with separate lab draw in our office Continue with present dosing Further management recommendations pending results of lab work

## 2020-09-26 NOTE — Progress Notes (Signed)
    Procedures performed today:    None.  Independent interpretation of notes and tests performed by another provider:   None.  Brief History, Exam, Impression, and Recommendations:    BP 110/76   Pulse 68   Ht 5\' 8"  (1.727 m)   Wt 254 lb (115.2 kg)   SpO2 97%   BMI 38.62 kg/m   Spinal stenosis Patient with known history of spinal stenosis.  Prior MRI of cervical spine in 2021 revealed multilevel cervical degenerative disc disease with severe spinal canal stenosis at C4-5 and moderate spinal canal stenosis at C3-4.  He was also found to have multilevel moderate to severe neuroforaminal stenosis which was worst at right C3-4, bilateral C4-5 and right C6-7.  He also has CT scan of the lumbar spine in 2020 which revealed advanced and diffuse degenerative disease with moderate to advanced spinal stenosis from L2-3 to L5-S1.  Patient has had intermittent back pain for the years and has used various OTC measures including lidocaine patches, topical rubs, or medications.  He denies current or prior issues with motor weakness, no change in sensation.  No bowel or bladder incontinence.  Given continued pain and symptoms however, patient is interested in discussing treatment modalities with neurosurgical specialist. Referral placed to neurosurgery for further discussion of treatment options including localized injection versus surgical interventions.  Memory loss Patient endorses that he has been having some forgetfulness where he may forget things that were told to him recently.  He does seem that oftentimes he will recall these slightly later but is having some difficulty with recall.  Reports family history of Alzheimer's and thus has some concerns regarding his reported forgetfulness.  Does not report that forgetfulness or memory difficulties are impacting activities of daily living.  Reports he does like to do computer games that provide cognitive exercise. Given patient concerns, will refer for  neurocognitive evaluation for further testing, management recommendations  DM2 (diabetes mellitus, type 2) ([place]) Unsure of last time A1c was checked, in our records it has been quite sometime He does have plans for labs at the [place] later this week, does think that A1c and INR will be checked with his labs Patient will attempt to have lab results forwarded to our office for review If A1c is not checked, will do so with separate lab draw in our office Continue with lifestyle modifications Further management recommendations pending results of lab work  Chronic anticoagulation Currently taking warfarin, due for INR check He does have plans for labs at the [place] later this week, does think that A1c and INR will be checked with his labs Patient will attempt to have lab results forwarded to our office for review If INR is not checked, will do so with separate lab draw in our office Continue with present dosing Further management recommendations pending results of lab work   ___________________________________________ Olly Shiner de [place], MD, ABFM, CAQSM [facility] [place]

## 2020-09-26 NOTE — Assessment & Plan Note (Signed)
Patient endorses that he has been having some forgetfulness where he may forget things that were told to him recently.  He does seem that oftentimes he will recall these slightly later but is having some difficulty with recall.  Reports family history of Alzheimer's and thus has some concerns regarding his reported forgetfulness.  Does not report that forgetfulness or memory difficulties are impacting activities of daily living.  Reports he does like to do computer games that provide cognitive exercise. Given patient concerns, will refer for neurocognitive evaluation for further testing, management recommendations

## 2020-09-26 NOTE — Assessment & Plan Note (Addendum)
Unsure of last time A1c was checked, in our records it has been quite sometime He does have plans for labs at the [place] later this week, does think that A1c and INR will be checked with his labs Patient will attempt to have lab results forwarded to our office for review If A1c is not checked, will do so with separate lab draw in our office Continue with lifestyle modifications Further management recommendations pending results of lab work

## 2020-10-09 ENCOUNTER — Encounter: Payer: Self-pay | Admitting: Counselor

## 2020-10-09 ENCOUNTER — Ambulatory Visit: Payer: Medicare Other | Admitting: Psychology

## 2020-10-09 ENCOUNTER — Other Ambulatory Visit: Payer: Self-pay

## 2020-10-09 ENCOUNTER — Ambulatory Visit (INDEPENDENT_AMBULATORY_CARE_PROVIDER_SITE_OTHER): Payer: Medicare Other | Admitting: Counselor

## 2020-10-09 DIAGNOSIS — R413 Other amnesia: Secondary | ICD-10-CM | POA: Diagnosis not present

## 2020-10-09 DIAGNOSIS — R4189 Other symptoms and signs involving cognitive functions and awareness: Secondary | ICD-10-CM

## 2020-10-09 DIAGNOSIS — R419 Unspecified symptoms and signs involving cognitive functions and awareness: Secondary | ICD-10-CM | POA: Diagnosis not present

## 2020-10-09 NOTE — Progress Notes (Signed)
[facility]  Norris Name: [name] MRN: [id number] Date of Birth: 1945/10/06 Age: 75 y.o. Education: 16 years  Referral Circumstances and Background Information  [name] is a 75 y.o., right-hand dominant, marred man with a history of DM2, aFIB (on anticoagulation), CDK III, HLD, and back problems. [name] is a Norris of Dr. [place] with [place] at [facility] and recently expressed some concern about memory problems, prompting evaluation for workup.   On interview, [name] reported that Troy problems were noticed by his Norris prior to being noticed by him. When she pointed them out, [name] became somewhat concerned. This is mainly over Troy past year. [name] had few examples of his complaints, it sounds like Troy main thing [name] notices is not getting chores done (which sounded more due to procrastination) and also misplacing things from time to time. I get Troy feeling that there is some disagreement between [name] and his Norris, because she asks him to do so many things that [name] thinks its unreasonable (she is still working and [name] is retired so [name] does most things). [name] denied that [name] is having problems with repeating himself or forgetting things that she says. There is no rapid forgetting of information and [name] doesn't not forget entire events. [name] doesn't have problems keeping track of Troy month or Troy year. 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 [name] is building things and is handy around Troy house and fixes things. There are no delusions or hallucinations. [name] was vague about his mood, it sounds like [name] has some stress related to his 81 year old mother in law living with him and other family members have been visiting. [name] said that his sleep is ok, [name] sleeps about 8 hours. His energy is not what it used to be, [name] gets up and is tired by noon and can fall asleep  easily. [name] denied any snoring. [name] said [name] had a sleep study previously, they told him [name] had sleep apnea, and [name] lost 80 lbs and then said [name] didn't have it. His appetite is stable, although [name] did gain a bit of wait after retiring.   I was able to speak with [name] while [name] completed testing. She corroborated that Troy changes have been noticed over Troy past year, although it is not so much that [name] is forgetting to do things on his list, it is more than [name] is forgetting [name] has done things. [name] also forgets conversations that they have had. There isn't rapid forgetting of information or forgetting entire events. She said that [name] is in fact very diligent with Troy chores and "if it's on that list, [name] gets it done." She confirmed Troy below information that [name] does not have any difficulties functionally as a result of his cognitive problems.   With respect to functioning, [name] reported that [name] does not have problems in any area as a result of his memory and thinking problems. [name] stated that [name] is still driving and has no problems. [name] cleans around Troy house and does a number of things because his Norris is still working. [name] is able to cook and has no problems putting recipes together. [name] is independent with respect to grocery shopping and community utilization. [name] is managing his medications and has no problems doing so. His Norris manages Troy finances and always  has. [name] is able to use a computer, smart phone, etc. [name] stated that [name] has a gym membership but wasn't clear as to whether [name] uses it or not. [name] spends most of his time doing things around Troy house.   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   Current Outpatient Medications  Medication Sig Dispense Refill   acetaminophen (TYLENOL) 500 MG tablet Take 1,000 mg by mouth every 6 (six) hours as needed for mild pain.     allopurinol (ZYLOPRIM) 100 MG tablet TAKE 1 TABLET BY MOUTH TWICE A DAY 60 tablet 1   furosemide (LASIX) 20 MG tablet Take 20 mg by mouth every other day.     Glucose Blood (FREESTYLE LITE TEST VI)      lidocaine (LIDODERM) 5 % Place 1 patch onto Troy skin daily. Remove & Discard patch within 12 hours or as directed by MD     metoprolol (LOPRESSOR) 50 MG tablet Take 50 mg by mouth 2 (two) times daily.      omeprazole (PRILOSEC) 40 MG capsule Take by mouth daily.  2   simvastatin (ZOCOR) 20 MG tablet Take 20 mg by mouth at bedtime.     tadalafil (CIALIS) 5 MG tablet Take 1 tablet (5 mg total) by mouth daily. 30 tablet 1   warfarin (COUMADIN) 2 MG tablet Take 2 mg by mouth every evening.     No current facility-administered medications for this visit.    Family History  Problem Relation Age of Onset   Hypertension Mother    Alzheimer's disease Mother    Heart disease Father        cabgx2, respiratory failure, pacemaker    There is a family history of dementia. His mother and her sister developed dementia in their late 59s and both died of Troy condition. They were twins. There is no  family history of psychiatric illness.  Psychosocial History  Developmental, Educational and Employment History: [name]  is a native of [place] area but has lived a number of different places ([place], [place], [place]). [name] reported that [name] was "a little about average" in school, [name] was never held back and had no learning problems. 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 [name] served in [place] [employer] for a time out of school, [name] served for 4 years. It sounds like [name] did very well and attained a rank of E5 within 2 years. [name] was in [place] and served in a combat support group during [place] but was not actively involved in combat. 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. [name] retired when [employer] came along. [name] had 5 gentleman working under him.   Psychiatric History: [name] denied any history of mental health treatment.   Substance Use History: [name] does not use alcohol or tobacco.   Relationship History and Living Cimcumstances: [name] and his Norris have been married 63 years. It sounds as though there is some degree of conflict although it is manageable. They have two children, neither of whom are concerned.   Mental Status and Behavioral Observations  Sensorium/Arousal: [name]'s level of arousal was awake and alert. Hearing and vision were adequate for testing purposes. Orientation: [name] was alert and oriented to person, place, time, and situation.  Appearance: Dressed in appropriate, casual clothing with reasonable grooming and hygiene.  Behavior: Pleaseant, appropriate, cooperative with interview and testing.  Speech/language: [name] spoke with normal rate, rhythm, volume and prosody. No paraphasic errors or word finding problems.  Gait/Posture: Not formally examined Movement: No  rest tremor, bradykinesia etc.  Social Comportment: Appropriate within social norms Mood: Not clearly described by Norris, sounds mainly euthymic with some minor stress at home Affect: Euthymic Thought process/content: [name] was often indirect when asking questions and had a hard time providing examples of his cognitive problems. His thought content was nevertheless appropriate.  Safety: No safety concerns identified at today's encounter Insight: Unclear  Montreal Cognitive Assessment  10/09/2020  Visuospatial/ Executive (0/5) 2  Naming (0/3) 3  Attention: Read list of digits (0/2) 0  Attention: Read list of letters (0/1) 1  Attention: Serial 7 subtraction starting at 100 (0/3) 0  Language: Repeat phrase (0/2) 1  Language : Fluency (0/1) 0  Abstraction (0/2) 1  Delayed Recall (0/5) 1  Orientation (0/6) 6  Total 15  Adjusted Score (based on education) 15   Test Procedures  Wide Range Achievement Test - 4             Word Reading Neuropsychological Assessment Battery  Memory Module  Naming  Digit Span Repeatable Battery for [place] Assessment of Neuropsychological Status (Form A)  Figure Copy  Judgment of Line Orientation  Coding  Figure Recall Troy Dot Counting Test A Random Letter Test Controlled Oral Word Association (F-A-S) Semantic Fluency (Animals) Trail Making Test A & B Complex Ideational Material Modified [place] Card Sorting Test Geriatric Depression Scale - Short Form  Plan  [name] was seen for a psychiatric diagnostic evaluation and neuropsychological testing. [name] is a pleasant, 75 year old, right-hand dominant man with a history of memory and thinking problems over Troy past year. To hear [name] and his Norris talk about his difficulties, it sounds as though at most [name] is having questionable mild memory problems. [name] is screening at a 15 on Troy MoCA today. I am concerned there may be more cognitive impairment than they are aware of. Full and  complete note with impressions, recommendations, and interpretation of test data to follow.   Viviano Simas Nicole Kindred, PsyD, [facility] Clinical Neuropsychologist  Informed Consent  Risks and benefits of Troy evaluation were discussed with [name] prior to all testing procedures. I conducted a clinical interview and neuropsychological testing (at least two tests) with [name] and [name], B.S. (Technician) administered additional test procedures. [name] was able to tolerate  Troy testing procedures and [name] (and/or family if applicable) is likely to benefit from further follow up to receive Troy diagnosis and treatment recommendations, which will be rendered at Troy next encounter.

## 2020-10-09 NOTE — Progress Notes (Signed)
Psychometrist Note   Cognitive testing was administered to [name] by  , B.S. (Technician) under the supervision of Peter Stewart, Psy.D., ABN. [name] was able to tolerate all test procedures. Dr. Stewart met with the patient as needed to manage any emotional reactions to the testing procedures. Rest breaks were offered.    The battery of tests administered was selected by Dr. Stewart with consideration to the patient's current level of functioning, the nature of his symptoms, emotional and behavioral responses during the interview, level of literacy, observed level of motivation/effort, and the nature of the referral question. This battery was communicated to the psychometrist. Communication between Dr. Stewart and the psychometrist was ongoing throughout the evaluation and Dr. Stewart was immediately accessible at all times. Dr. Stewart provided supervision to the technician on the date of this service, to the extent necessary to assure the quality of all services provided.    [name] will return in approximately one week for an interactive feedback session with Dr. Stewart, at which time test performance, clinical impressions, and treatment recommendations will be reviewed in detail. The patient understands he can contact our office should he require our assistance before this time.   A total of 120 minutes of billable time were spent with [name] by the technician, including test administration and scoring time. Billing for these services is reflected in Dr. Stewart's note.   This note reflects time spent with the psychometrician and does not include test scores, clinical history, or any interpretations made by Dr. Stewart. The full report will follow in a separate note.  

## 2020-10-10 NOTE — Progress Notes (Signed)
[facility]  Patient Name: [name] MRN: [id number] Date of Birth: 01-Oct-1945 Age: 75 y.o. Education: 16 years  Measurement properties of test scores: IQ, Index, and Standard Scores (SS): Mean = 100; Standard Deviation = 15 Scaled Scores (Ss): Mean = 10; Standard Deviation = 3 Z scores (Z): Mean = 0; Standard Deviation = 1 T scores (T); Mean = 50; Standard Deviation = 10  TEST SCORES:    Note: This summary of test scores accompanies the interpretive report and should not be interpreted by unqualified individuals or in isolation without reference to the report. Test scores are relative to age, gender, and educational history as available and appropriate.   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      Expected Functioning        Wide Range Achievement Test: Standard/Scaled Score Percentile      Word Reading 75 5      Reynolds Intellectual Screening Test Standard/T-score Percentile      Guess What 48 42      Odd Item Out 50 50  RIST Index 99 47      Attention/Processing Speed        Neuropsychological Assessment Battery (Attention Module, Form 1): Scaled/T-score Percentile      Digits Forward 36 8      Digits Backwards 40 16      Repeatable Battery for the Assessment of Neuropsychological Status (Form A): Standard Score Percentile     Coding 6 9      Language        Neuropsychological Assessment Battery (Language Module, Form 1): T-score Percentile      Naming   (31) 57 75      Verbal Fluency:  T Score Percentile      Controlled Oral Word Association (F-A-S) 27 1      Semantic Fluency (Animals) 38 12      Memory:        Neuropsychological Assessment Battery (Memory Module, Form 1): T-score/Standard Score Percentile  Memory Index (MEM): 74 4       List Learning           List A Immediate Recall   (1 , 5 , 5) 27 1         List B Immediate Recall   (4) 48 42         List A Short Delayed Recall   (3) 30 2         List A Long Delayed Recall   (0) 23 <1         List A Percent Retention   (0 %) --- <1         List A Long Delayed Yes/No Recognition Hits   (8) --- <1         List A Long Delayed Yes/No Recognition False Alarms   (7) --- 21         List A Recognition Discriminability Index --- 5      Shape Learning           Immediate Recognition   (4 , 7 , 7) 58 79         Delayed Recognition   (6) 53 62  Percent Retention   (84 %) --- 34         Delayed Forced-Choice Recognition Hits   (6) --- 5         Delayed Forced-Choice Recognition False Alarms   (1) --- 54         Delayed Forced-Choice Recognition Discriminability --- 21      Story Learning           Immediate Recall   (20, 31) 40 16         Delayed Recall   (26) 40 16         Percent Retention   (84 %) --- 34      Daily Living Memory            Immediate Recall   (19, 14) 35 7          Delayed Recall   (7, 0) 26 1          Percent Retention (50 %) --- 2          Recognition Hits   (7) --- 8      Repeatable Battery for the Assessment of Neuropsychological Status (Form A): Scaled Score Percentile         Figure Recall   (10) 8 25      Visuospatial/Constructional Functioning        Repeatable Battery for the Assessment of Neuropsychological Status (Form A): Standard/Scaled Score Percentile      Visuospatial/Constructional Index 105 63         Figure Copy   (18) 10 50         Judgment of Line Orientation   (18) --- 51-75      Executive Functioning        Modified [employer] Test (MWCST): Standard/T-Score Percentile      Number of Categories Correct 37 9      Number of Perseverative Errors 54 66      Number of Total Errors 45 31      Percent Perseverative Errors 58 79  Executive Function Composite 93 32      Trail Making Test: T-Score Percentile       Part A 38 12      Part B 37 9      Boston Diagnostic Aphasia Exam: Raw Score Scaled Score      Complex Ideational Material 11 9      Clock Drawing Raw Score Descriptor      Command 7 Mild Impairment      Rating Scales        Clinical Dementia Rating Raw Score Descriptor      Sum of Boxes 1.5 Mild Cognitive Impairment      Global Score 0.5 MCI       Raw Score Descriptor  Geriatric Depression Scale - Short Form 1 Negative   Adnan Vanvoorhis V. Nicole Kindred PsyD, [facility] Clinical Neuropsychologist

## 2020-10-11 NOTE — Progress Notes (Signed)
[facility]  Patient Name: [name] MRN: [id number] Date of Birth: 05/31/1945 Age: 75 y.o. Education: 11 years  Clinical Impressions  [name] is a 75 y.o., right-hand dominant, married man with a history of DM2, AFIB (on permanent anticoagulation), CKD III, HLD, back problems and memory and thinking changes over about the past year. The patient's wife noticed problems before he did, and when she pointed them out, he became concerned. The patient reported that he forgets to do things on his to do list. Somewhat more concerningly, his wife reported that he forgets things that he has already done. He also repeats himself and forget sconversations they have had. There is no rapid forgetting of information and he is not functionally impaired in any area.  On neuropsychological testing, there is evidence of low scores in numerous areas, including marginal findings on measures of performance validity that are challenging to explain. Given that there is no external incentive for invalid performance and he did appear to be paying attention to test stimuli, I can only assume that these low scores reflect genuine cognitive difficulties and that the test data are interpretable with some appropriate caution. He demonstrated less than expected performance on measures of attention/processing speed, memory, and on select measures of executive abilities. His memory performance shows deficient encoding and delayed recall for a word list, relatively better low average performance when learning and then recalling a short story, and marginal performance on daily living memory. He did much better with visual information than verbal information, with mainly average scores. Performance was within normal limits on visuospatial/constructional measures.   [name] is demonstrating cognitive test findings suggesting fairly notable difficulties with aspects of attention,  executive control, and perhaps also memory. It is challenging to ascertain whether his difficulties with memory are primary or secondary to his executive/attentional issues. This is a mild cognitive impairment level problem. Etiology of his problems is unclear, but he should be followed over time. We will discuss preventative strategies, including diet and exercise with him and his wife. I do not see an A1c in the chart, although I see Dr. de [place] was considering ordering one. There is a link between diabetes and dementia, and [name] may benefit from further diabetes management depending on how well he is controlling his blood glucose.Will also suggest that he implement compensatory strageies.   Diagnostic Impressions: Mild cognitive impairment  Recommendations to be discussed with patient  Your performance and presentation were associated with low scores on measures of attention, processing speed, and select tasks of executive function. You also had some low scores on memory measures. It is difficult to tell if your memory problems are secondary to these other cognitive issues or belie an underlying primary memory problem. Given that you are still functioning well day-to-day, I think the best diagnosis at this point in time is mild cognitive impairment.  The major difference between mild cognitive impairment (MCI) and dementia is in severity and potential prognosis. Once someone reaches a level of severity adequate to be diagnosed with a dementia, there is usually progression over time, though this may be years. On the other hand, mild cognitive impairment, while a significant risk for dementia in future, does not always progress to dementia, and in some instances stays the same or can even revert to normal. It is important to realize that if MCI is due to underlying Alzheimer's disease, it will most likely progress to dementia eventually. The rate of conversion  to Alzheimer's dementia from amnestic  MCI is about 15% per year versus the general population risk of conversion of 2% per year.   In your case, I am not sure what is causing your mild cognitive impairment. You are not on any cognitively impairing medications, you reported that you sleep adequately, andyou do not have any significant anxiety and depression. One possibility is that these problems are due to an underlying condition. If that were the case, then there will be progression across time. I think you should follow-up with reevaluation no sooner than 1 year and no later than 2 years, to follow your progress and see how you are trending over time.  Now is an excellent time to implement preventative strategies. These generally involve diet and exercise.  There is now good quality evidence from at least one large scale study that a modified mediterranean diet may help slow cognitive decline. This is known as the "MIND" diet. The Mind diet is not so much a specific diet as it is a set of recommendations for things that you should and should not eat.   Foods that are ENCOURAGED on the MIND Diet:  Green, leafy vegetables: Aim for six or more servings per week. This includes kale, spinach, cooked greens and salads.  All other vegetables: Try to eat another vegetable in addition to the green leafy vegetables at least once a day. It is best to choose non-starchy vegetables because they have a lot of nutrients with a low number of calories.  Berries: Eat berries at least twice a week. There is a plethora of research on strawberries, and other berries such as blueberries, raspberries and blackberries have also been found to have antioxidant and brain health benefits.  Nuts: Try to get five servings of nuts or more each week. The creators of the [place] don't specify what kind of nuts to consume, but it is probably best to vary the type of nuts you eat to obtain a variety of nutrients. Peanuts are a legume and do not fall into this category.   Olive oil: Use olive oil as your main cooking oil. There may be other heart-healthy alternatives such as algae oil, though there is not yet sufficient research upon which to base a formal recommendation.  Whole grains: Aim for at least three servings daily. Choose minimally processed grains like oatmeal, quinoa, brown rice, whole-wheat pasta and 100% whole-wheat bread.  Fish: Eat fish at least once a week. It is best to choose fatty fish like salmon, sardines, trout, tuna and mackerel for their high amounts of omega-3 fatty acids.  Beans: Include beans in at least four meals every week. This includes all beans, lentils and soybeans.  Poultry: Try to eat chicken or [place] at least twice a week. Note that fried chicken is not encouraged on the MIND diet.  Wine: Aim for no more than one glass of alcohol daily. Both red and white wine may benefit the brain. However, much research has focused on the red wine compound resveratrol, which may help protect against Alzheimer's disease.  Foods that are DISCOURAGED on the MIND Diet: Butter and margarine: Try to eat less than 1 tablespoon (about 14 grams) daily. Instead, try using olive oil as your primary cooking fat, and dipping your bread in olive oil with herbs.  Cheese: The MIND diet recommends limiting your cheese consumption to less than once per week.  Red meat: Aim for no more than three servings each week. This includes all  beef, pork, lamb and products made from these meats.  [place] food: The MIND diet highly discourages fried food, especially the kind from fast-food restaurants. Limit your consumption to less than once per week.  Pastries and sweets: This includes most of the processed junk food and desserts you can think of. Ice cream, cookies, brownies, snack cakes, donuts, candy and more. Try to limit these to no more than four times a week.  Exercise is one of the best medicines for promoting health and maintaining cognitive fitness at all stages  in life. Exercise probably has the largest documented effect on brain health and performance of any lifestyle intervention. Studies have shown that even previously sedentary individuals who start exercising as late as age 8 show a significant survival benefit as compared to their non-exercising peers. In the [place], the current guidelines are for 30 minutes of moderate exercise per day, but increasing your activity level less than that may also be helpful. You do not have to get your 30 minutes of exercise in one shot and exercising for short periods of time spread throughout the day can be helpful. Go for several walks, learn to dance, or do something else you enjoy that gets your body moving. Of course, if you have an underlying medical condition or there is any question about whether it is safe for you to exercise, you should consult a medical treatment provider prior to beginning exercise.   The following compensatory strategies may be helpful for managing day-to-day memory symptoms: Minimize distractions and interruptions to the extent possible. Be an active observer, present-minded, and focus attention. Focus on only one task for a period of time.  Get organized. Establish routines and stick to them. Make and use checklists.  Use external memory aids as needed, such as a planner and notebook. Repetition, written reminders, and keeping a calendar of appointments may be helpful. Designate a place to keep your keys, wallet, cell phone, and other personal belongings.  Break down tasks into smaller steps to help get started and to keep from feeling overwhelmed.  Increase your success learning information by breaking it into manageable chunks, connecting it to previously learned information, or forming associations with what you are trying to remember.   Countless studies have found a link between diabetes (particularly midlife diabetes) and dementia, with general estimates of about 1.5 - 2 times  the general population risk of developing dementia for diabetic individuals. Individuals with poorly controlled diabetes in midlife are at particularly high risk with those having an HbA1c ? 6.5% at about a 3-fold risk and those with an HbA1c ? 7% at a 5-fold risk of developing dementia as compared with the general population. Work diligently to control your diabetes. Many of the healthy lifestyle changes that can help manage blood sugar (things like exercise and diet) have also been shown to have beneficial effects on brain health.   Test Findings  Test scores are summarized in additional documentation associated with this encounter. Test scores are relative to age, gender, and educational history as available and appropriate. There were concerns about performance validity, with multiple scores on stand-alone and embedded measures falling below the expected ranges. Given that there are no external incentives for poor performance validity and he appeared to be well engaged in the testing, it seems likely that these reflect his cognitive difficulties as opposed to any other factors.   General Intellectual Functioning/Achievement:  Performance was unusually low on single word reading. Performance on the wrist index  was average, with comparable average range scores on the verbally and visually mediated indicators. This may reflect some premorbid weaknesses in reading.  Attention and Processing Efficiency: Performance on indicators of attention and processing speed was below expectations. On verbally mediated indicators involving digit repetition, digit repetition forward was unusually low and digit repetition backward was low average. His performance on timed number symbol coding was unusually low.  Language: Indicators of language showed intact errorless visual object confrontation naming. His generation of words on the other hand was somewhat less robust. Generation of words in response to the letters  F-A-S was extremely low. Generation of words in response to the category prompt animals was low average.  Visuospatial Function: Performance on visuospatial and constructional indicators was good, with an overall score falling in the average range on the RBANS visuospatial/constructional index. Copy of the line drawing was average. Judgment of angular line orientations was average to high average.  Learning and Memory: Measures of learning and memory fell below expectations for an individual of Mr. Zipkin overall cognitive ability, with an unusually low score on the memory index from the NAB. The profile of scores shows difficulties with acquisition and retention of unstructured verbal information, with much better learning and delayed recall of structured verbal information and visual information. It is difficult to tell if this represents a developing storage problem or simply interference from attention and executive issues.  In the verbal realm, he learned 1, 5, and 5 words of a 12 item word list across 3 learning trials, which is extremely low. He retained 3 words on short delayed recall, which is unusually low, but then did not retain any words on long delayed recall for an extremely low delayed recall score.  Recognition discriminability was now much better, with an unusually low score recognizing words from the list versus false choices. Short story learning was low average on immediate recall and on delayed recall, and in this case there was good retention of information over time. His memory for brief daily living type information was unusually low. Retention of this information was weak and delayed recall was extremely low. He scored an unusually low level on delayed recognition.  In the visual realm, delayed recall of a modestly complex geometric figure was average.  Executive Functions: Performance on executive indicators was variable and scores on associated measures including  indicators of digit repetition and processing speed suggest that there may be some executive control problems. He scored at a weak, low average level on the Trail Making Test B, with a low average (almost unusually low) score.  His generation of words in response to the letters F-A-S was extremely low.  Clock drawing was consistent with "mild impairment ", with minor errors in numbering and stimulus bound placement of the hands. He did better on the modified [place] card sorting test, with an average score overall on the executive function composition. His performance when reasoning with verbal information was average.  Rating Scale(s): Mr. Fochs screened negative for the presence of depression. His CDR places him at more of a mild cognitive impairment than a dementia level problem.  Viviano Simas Nicole Kindred, PsyD, ABN Clinical Neuropsychologist  Coding and Compliance  Billing below reflects technician time, my direct face-to-face time with the patient, time spent in test administration, and time spent in professional activities including but not limited to: neuropsychological test interpretation, integration of neuropsychological test data with clinical history, report preparation, treatment planning, care coordination, and review of diagnostically pertinent medical history  or studies.   Services associated with this encounter: Clinical Interview [phone]) plus 140 minutes (96132/96133; Neuropsychological Evaluation by Professional)  30 minutes (96136/96137; Test Administration by Professional) 120 minutes (96138/96139; Neuropsychological Testing by Technician)

## 2020-10-16 ENCOUNTER — Telehealth: Payer: Self-pay | Admitting: *Deleted

## 2020-10-16 DIAGNOSIS — M5030 Other cervical disc degeneration, unspecified cervical region: Secondary | ICD-10-CM | POA: Diagnosis not present

## 2020-10-16 DIAGNOSIS — R030 Elevated blood-pressure reading, without diagnosis of hypertension: Secondary | ICD-10-CM | POA: Diagnosis not present

## 2020-10-16 DIAGNOSIS — Z6831 Body mass index (BMI) 31.0-31.9, adult: Secondary | ICD-10-CM | POA: Diagnosis not present

## 2020-10-16 DIAGNOSIS — M5412 Radiculopathy, cervical region: Secondary | ICD-10-CM | POA: Diagnosis not present

## 2020-10-16 NOTE — Telephone Encounter (Signed)
   [facility] Pre-operative Risk Assessment    Patient Name: [name]  DOB: 04-13-1945 MRN: [id number]  HEARTCARE STAFF:  - IMPORTANT!!!!!! Under Visit Info/Reason for Call, type in Other and utilize the format Clearance MM/DD/YY or Clearance TBD. Do not use dashes or single digits. - Please review there is not already an duplicate clearance open for this procedure. - If request is for dental extraction, please clarify the # of teeth to be extracted. - If the patient is currently at the dentist's office, call Pre-Op Callback Staff (MA/nurse) to input urgent request.  - If the patient is not currently in the dentist office, please route to the Pre-Op pool.  Request for surgical clearance:  What type of surgery is being performed? CERVICAL SPINE INJECTION  When is this surgery scheduled? TBD  What type of clearance is required (medical clearance vs. Pharmacy clearance to hold med vs. Both)? BOTH  Are there any medications that need to be held prior to surgery and how long? COUMADIN x 5 DAYS PRIOR TO INJECTION  Practice name and name of physician performing surgery? [place] [facility]; DR. Lenord Carbo  What is the office phone number? [phone]   7.   What is the office fax number? [phone]  8.   Anesthesia type (None, local, MAC, general) ? NOT LISTED    Julaine Hua 10/16/2020, 4:43 PM  _________________________________________________________________   (provider comments below)

## 2020-10-16 NOTE — Telephone Encounter (Addendum)
   Name: JONELLE BALTIERRA  DOB: [birth year]  MRN: [id number]   Primary Cardiologist: Peter Martinique, MD  Chart reviewed as part of pre-operative protocol coverage. Patient was contacted 10/16/2020 in reference to pre-operative risk assessment for pending surgery as outlined below.  SAMRATH PELT was last seen on 06/17/2020 by Dr. Martinique.  Since that day, RASAAN PHOMMACHANH has done well without any exertional chest pain or worsening dyspnea.  We will defer to his primary care provider to decide how long to hold Coumadin.  His Coumadin is being managed by his PCP.  Therefore, based on ACC/AHA guidelines, the patient would be at acceptable risk for the planned procedure without further cardiovascular testing.   The patient was advised that if he develops new symptoms prior to surgery to contact our office to arrange for a follow-up visit, and he verbalized understanding.  I will route this recommendation to the requesting party via Epic fax function and remove from pre-op pool. Please call with questions.  [place], [place] 10/16/2020, 5:14 PM

## 2020-10-16 NOTE — Telephone Encounter (Signed)
Coumadin managed by PCP, will defer to PCP to decide how long to hold Coumadin prior to the injection.

## 2020-10-17 ENCOUNTER — Telehealth (HOSPITAL_BASED_OUTPATIENT_CLINIC_OR_DEPARTMENT_OTHER): Payer: Self-pay

## 2020-10-17 MED ORDER — WARFARIN SODIUM 2 MG PO TABS: 2.0000 mg | ORAL_TABLET | Freq: Every evening | ORAL | 1 refills | Status: DC

## 2020-10-17 MED ORDER — ALLOPURINOL 100 MG PO TABS: 100.0000 mg | ORAL_TABLET | Freq: Two times a day (BID) | ORAL | 1 refills | Status: DC

## 2020-10-17 NOTE — Telephone Encounter (Signed)
Patient called in requesting medication refill on Allopurinol and Warfarin Patient scheduled for follow up on 08/15 Will authorize refills until office visit is completed.

## 2020-10-17 NOTE — Telephone Encounter (Signed)
Received a Cardiac Clearance via fax for patient to have a Cervical Spine Injection Requests asking for patient to hold warfarin 5 days prior to injection Reviewed with Dr. de [place], Ok to proceed with procedure and hold warfarin Faxed form to [phone]

## 2020-10-28 ENCOUNTER — Ambulatory Visit (HOSPITAL_BASED_OUTPATIENT_CLINIC_OR_DEPARTMENT_OTHER): Payer: Medicare Other | Admitting: Family Medicine

## 2020-10-28 ENCOUNTER — Encounter (HOSPITAL_BASED_OUTPATIENT_CLINIC_OR_DEPARTMENT_OTHER): Payer: Self-pay | Admitting: Family Medicine

## 2020-10-28 ENCOUNTER — Ambulatory Visit (INDEPENDENT_AMBULATORY_CARE_PROVIDER_SITE_OTHER): Payer: Medicare Other | Admitting: Family Medicine

## 2020-10-28 ENCOUNTER — Other Ambulatory Visit: Payer: Self-pay

## 2020-10-28 VITALS — BP 138/76 | HR 76 | Ht 68.0 in | Wt 251.3 lb

## 2020-10-28 DIAGNOSIS — Z7901 Long term (current) use of anticoagulants: Secondary | ICD-10-CM

## 2020-10-28 DIAGNOSIS — M4800 Spinal stenosis, site unspecified: Secondary | ICD-10-CM | POA: Diagnosis not present

## 2020-10-28 DIAGNOSIS — I4821 Permanent atrial fibrillation: Secondary | ICD-10-CM | POA: Diagnosis not present

## 2020-10-28 DIAGNOSIS — I872 Venous insufficiency (chronic) (peripheral): Secondary | ICD-10-CM | POA: Diagnosis not present

## 2020-10-28 LAB — POCT INR: INR: 3.5 — AB (ref 2.0–3.0)

## 2020-10-28 NOTE — Progress Notes (Signed)
    Procedures performed today:    None.  Independent interpretation of notes and tests performed by another provider:   None.  Brief History, Exam, Impression, and Recommendations:    BP 138/76   Pulse 76   Ht '5\' 8"'$  (1.727 m)   Wt 251 lb 4.8 oz (114 kg)   SpO2 99%   BMI 38.21 kg/m   Permanent atrial fibrillation (HCC) INR today found to be slightly supratherapeutic Patient feels that he has taken over-the-counter medications more frequently recently due to his back pain and this led to the slight increase Previously was controlled on current dosing regimen Will repeat INR in 1 week Patient does have upcoming spinal injection for which warfarin will be held preprocedurally  Spinal stenosis Has arranged for spinal injection to be completed Due to planned procedure, warfarin is to be held prior to injection Procedure is planned for August 29  Stasis dermatitis Patient reports having rash involving both lower extremities, left slightly worse than right Has seen dermatology for this in the past with recommendations for compression wraps/stockings as well as topical treatments Has not seen dermatology for several months now Has been trying to utilize compression wraps, however some difficulty with these On exam, patient with appearance of stasis dermatitis over bilateral lower extremities with mild erythema, scaling, edema Recommend the patient follow-up with dermatology for further evaluation and treatment recommendations Recommend continuing with compression wraps/stockings as able, can also elevate bilateral lower extremities, ensure elevating higher than level of the heart for maximal benefit    ___________________________________________ Hamdi Vari de [place], MD, ABFM, CAQSM [facility] [place]

## 2020-10-28 NOTE — Patient Instructions (Signed)
  Medication Instructions:  Your physician recommends that you continue on your current medications as directed. Please refer to the Current Medication list given to you today. --If you need a refill on any your medications before your next appointment, please call your pharmacy first. If no refills are authorized on file call the office.-- Follow-Up: Your next appointment:   Your physician recommends that you schedule a follow-up appointment in: 4-6 WEEKS Dr. de [place]  Thanks for letting us be apart of your health journey!!  Primary Care and Sports Medicine   Dr. Arlina Robes [place]   We encourage you to activate your patient portal called "MyChart".  Sign up information is provided on this After Visit Summary.  MyChart is used to connect with patients for Virtual Visits (Telemedicine).  Patients are able to view lab/test results, encounter notes, upcoming appointments, etc.  Non-urgent messages can be sent to your provider as well. To learn more about what you can do with MyChart, please visit --  [email or web address].

## 2020-10-29 DIAGNOSIS — I872 Venous insufficiency (chronic) (peripheral): Secondary | ICD-10-CM | POA: Insufficient documentation

## 2020-10-29 NOTE — Assessment & Plan Note (Signed)
INR today found to be slightly supratherapeutic Patient feels that he has taken over-the-counter medications more frequently recently due to his back pain and this led to the slight increase Previously was controlled on current dosing regimen Will repeat INR in 1 week Patient does have upcoming spinal injection for which warfarin will be held preprocedurally

## 2020-10-29 NOTE — Assessment & Plan Note (Signed)
Has arranged for spinal injection to be completed Due to planned procedure, warfarin is to be held prior to injection Procedure is planned for August 29

## 2020-10-29 NOTE — Assessment & Plan Note (Signed)
Patient reports having rash involving both lower extremities, left slightly worse than right Has seen dermatology for this in the past with recommendations for compression wraps/stockings as well as topical treatments Has not seen dermatology for several months now Has been trying to utilize compression wraps, however some difficulty with these On exam, patient with appearance of stasis dermatitis over bilateral lower extremities with mild erythema, scaling, edema Recommend the patient follow-up with dermatology for further evaluation and treatment recommendations Recommend continuing with compression wraps/stockings as able, can also elevate bilateral lower extremities, ensure elevating higher than level of the heart for maximal benefit

## 2020-11-04 ENCOUNTER — Ambulatory Visit (HOSPITAL_BASED_OUTPATIENT_CLINIC_OR_DEPARTMENT_OTHER): Payer: Medicare Other

## 2020-11-04 ENCOUNTER — Encounter (HOSPITAL_BASED_OUTPATIENT_CLINIC_OR_DEPARTMENT_OTHER): Payer: Self-pay

## 2020-11-04 ENCOUNTER — Other Ambulatory Visit: Payer: Self-pay

## 2020-11-05 ENCOUNTER — Encounter: Payer: Self-pay | Admitting: Counselor

## 2020-11-05 ENCOUNTER — Ambulatory Visit (INDEPENDENT_AMBULATORY_CARE_PROVIDER_SITE_OTHER): Payer: Medicare Other | Admitting: Counselor

## 2020-11-05 DIAGNOSIS — G3184 Mild cognitive impairment, so stated: Secondary | ICD-10-CM | POA: Diagnosis not present

## 2020-11-05 NOTE — Patient Instructions (Signed)
Your performance and presentation were associated with low scores on measures of attention, processing speed, and select tasks of executive function. You also had some low scores on memory measures. It is difficult to tell if your memory problems are secondary to these other cognitive issues or belie an underlying primary memory problem. Given that you are still functioning well day-to-day, I think the best diagnosis at this point in time is mild cognitive impairment.  The major difference between mild cognitive impairment (MCI) and dementia is in severity and potential prognosis. Once someone reaches a level of severity adequate to be diagnosed with a dementia, there is usually progression over time, though this may be years. On the other hand, mild cognitive impairment, while a significant risk for dementia in future, does not always progress to dementia, and in some instances stays the same or can even revert to normal. It is important to realize that if MCI is due to underlying Alzheimer's disease, it will most likely progress to dementia eventually. The rate of conversion to Alzheimer's dementia from amnestic MCI is about 15% per year versus the general population risk of conversion of 2% per year.    In your case, I am not sure what is causing your mild cognitive impairment. You are not on any cognitively impairing medications, you reported that you sleep adequately, andyou do not have any significant anxiety and depression. One possibility is that these problems are due to an underlying condition. If that were the case, then there will be progression across time. I think you should follow-up with reevaluation no sooner than 1 year and no later than 2 years, to follow your progress and see how you are trending over time.  Now is an excellent time to implement preventative strategies. These generally involve diet and exercise.   There is now good quality evidence from at least one large scale study that  a modified mediterranean diet may help slow cognitive decline. This is known as the "MIND" diet. The Mind diet is not so much a specific diet as it is a set of recommendations for things that you should and should not eat.   Foods that are ENCOURAGED on the MIND Diet:  Green, leafy vegetables: Aim for six or more servings per week. This includes kale, spinach, cooked greens and salads.  All other vegetables: Try to eat another vegetable in addition to the green leafy vegetables at least once a day. It is best to choose non-starchy vegetables because they have a lot of nutrients with a low number of calories.  Berries: Eat berries at least twice a week. There is a plethora of research on strawberries, and other berries such as blueberries, raspberries and blackberries have also been found to have antioxidant and brain health benefits.  Nuts: Try to get five servings of nuts or more each week. The creators of the [place] don't specify what kind of nuts to consume, but it is probably best to vary the type of nuts you eat to obtain a variety of nutrients. Peanuts are a legume and do not fall into this category.  Olive oil: Use olive oil as your main cooking oil. There may be other heart-healthy alternatives such as algae oil, though there is not yet sufficient research upon which to base a formal recommendation.  Whole grains: Aim for at least three servings daily. Choose minimally processed grains like oatmeal, quinoa, brown rice, whole-wheat pasta and 100% whole-wheat bread.  Fish: Eat fish at least once  a week. It is best to choose fatty fish like salmon, sardines, trout, tuna and mackerel for their high amounts of omega-3 fatty acids.  Beans: Include beans in at least four meals every week. This includes all beans, lentils and soybeans.  Poultry: Try to eat chicken or [place] at least twice a week. Note that fried chicken is not encouraged on the MIND diet.  Wine: Aim for no more than one glass of  alcohol daily. Both red and white wine may benefit the brain. However, much research has focused on the red wine compound resveratrol, which may help protect against Alzheimer's disease.  Foods that are DISCOURAGED on the MIND Diet: Butter and margarine: Try to eat less than 1 tablespoon (about 14 grams) daily. Instead, try using olive oil as your primary cooking fat, and dipping your bread in olive oil with herbs.  Cheese: The MIND diet recommends limiting your cheese consumption to less than once per week.  Red meat: Aim for no more than three servings each week. This includes all beef, pork, lamb and products made from these meats.  [place] food: The MIND diet highly discourages fried food, especially the kind from fast-food restaurants. Limit your consumption to less than once per week.  Pastries and sweets: This includes most of the processed junk food and desserts you can think of. Ice cream, cookies, brownies, snack cakes, donuts, candy and more. Try to limit these to no more than four times a week.   Exercise is one of the best medicines for promoting health and maintaining cognitive fitness at all stages in life. Exercise probably has the largest documented effect on brain health and performance of any lifestyle intervention. Studies have shown that even previously sedentary individuals who start exercising as late as age 14 show a significant survival benefit as compared to their non-exercising peers. In the [place], the current guidelines are for 30 minutes of moderate exercise per day, but increasing your activity level less than that may also be helpful. You do not have to get your 30 minutes of exercise in one shot and exercising for short periods of time spread throughout the day can be helpful. Go for several walks, learn to dance, or do something else you enjoy that gets your body moving. Of course, if you have an underlying medical condition or there is any question about whether it is  safe for you to exercise, you should consult a medical treatment provider prior to beginning exercise.   The following compensatory strategies may be helpful for managing day-to-day memory symptoms: Minimize distractions and interruptions to the extent possible. Be an active observer, present-minded, and focus attention. Focus on only one task for a period of time.  Get organized. Establish routines and stick to them. Make and use checklists.  Use external memory aids as needed, such as a planner and notebook. Repetition, written reminders, and keeping a calendar of appointments may be helpful. Designate a place to keep your keys, wallet, cell phone, and other personal belongings.  Break down tasks into smaller steps to help get started and to keep from feeling overwhelmed.  Increase your success learning information by breaking it into manageable chunks, connecting it to previously learned information, or forming associations with what you are trying to remember.   Countless studies have found a link between diabetes (particularly midlife diabetes) and dementia, with general estimates of about 1.5 - 2 times the general population risk of developing dementia for diabetic individuals. Individuals with poorly  controlled diabetes in midlife are at particularly high risk with those having an HbA1c ? 6.5% at about a 3-fold risk and those with an HbA1c ? 7% at a 5-fold risk of developing dementia as compared with the general population. Work diligently to control your diabetes. Many of the healthy lifestyle changes that can help manage blood sugar (things like exercise and diet) have also been shown to have beneficial effects on brain health.

## 2020-11-05 NOTE — Progress Notes (Signed)
NEUROPSYCHOLOGY FEEDBACK NOTE [facility]  Feedback Note: I met with [name] to review the findings resulting from his neuropsychological evaluation. Since the last appointment, he has been about the same. Time was spent reviewing the impressions and recommendations that are detailed in the evaluation report. We discussed impression of mild cognitive impairment, as reflected in the patient instructions. I was candid with the patient and his wife that the findings could represent a condition at risk of further decline and that now is the time to improve health behaviors and initiate preventative lifestyle changes. We discussed interventions as reflected in the patient instructions. I took time to explain the findings and answer all the patient's questions. I encouraged [name] to contact me should he have any further questions or if further follow up is desired.   Current Medications and Medical History   Current Outpatient Medications  Medication Sig Dispense Refill   acetaminophen (TYLENOL) 500 MG tablet Take 1,000 mg by mouth every 6 (six) hours as needed for mild pain.     allopurinol (ZYLOPRIM) 100 MG tablet Take 1 tablet (100 mg total) by mouth 2 (two) times daily. 60 tablet 1   furosemide (LASIX) 20 MG tablet Take 20 mg by mouth every other day.     Glucose Blood (FREESTYLE LITE TEST VI)      lidocaine (LIDODERM) 5 % Place 1 patch onto the skin daily. Remove & Discard patch within 12 hours or as directed by MD     metoprolol (LOPRESSOR) 50 MG tablet Take 50 mg by mouth 2 (two) times daily.      omeprazole (PRILOSEC) 40 MG capsule Take by mouth daily.  2   simvastatin (ZOCOR) 20 MG tablet Take 20 mg by mouth at bedtime.     tadalafil (CIALIS) 5 MG tablet Take 1 tablet (5 mg total) by mouth daily. 30 tablet 1   warfarin (COUMADIN) 2 MG tablet Take 1 tablet (2 mg total) by mouth every evening. 30 tablet 1   No current facility-administered medications for this visit.     Patient Active Problem List   Diagnosis Date Noted   Stasis dermatitis 10/29/2020   Spinal stenosis 09/26/2020   Memory loss 09/26/2020   Chronic kidney disease 08/28/2020   Back pain 08/28/2020   Hyperkalemia 08/28/2016   Hyperkalemia, diminished renal excretion 08/27/2016   DM2 (diabetes mellitus, type 2) ([place]) 08/27/2016   Dysphagia 08/27/2016   Acute kidney injury superimposed on CKD ([place]) 08/27/2016   Gout 08/27/2016   Mitral insufficiency 09/23/2011   Hypertension    Permanent atrial fibrillation (HCC)    Chronic anticoagulation    Pulmonary hypertension ([place])    Hyperlipidemia     Mental Status and Behavioral Observations  [name] presented on time to the present encounter and was alert and generally oriented. Speech was normal in rate, rhythm, volume, and prosody. Self-reported mood was "good" and affect was euthymic. Thought process was logical and goal oriented and thought content was appropriate to the topics discussed. There were no safety concerns identified at today's encounter, such as thoughts of harming self or others.   Plan  Feedback provided regarding the patient's neuropsychological evaluation. He has MCI and should be followed over time. He will work on lifestyle changes and managing ongoing risk factors with Dr. [place]. HALFORD GOETZKE was encouraged to contact me if any questions arise or if further follow up is desired.   Viviano Simas Nicole Kindred PsyD, [place] Clinical Neuropsychologist  Service(s) Provided at This Encounter: 45 minutes ([id number]; Conjoint therapy with patient present)

## 2020-11-06 DIAGNOSIS — L97511 Non-pressure chronic ulcer of other part of right foot limited to breakdown of skin: Secondary | ICD-10-CM | POA: Diagnosis not present

## 2020-11-06 DIAGNOSIS — L97521 Non-pressure chronic ulcer of other part of left foot limited to breakdown of skin: Secondary | ICD-10-CM | POA: Diagnosis not present

## 2020-11-07 ENCOUNTER — Encounter: Payer: Medicare Other | Admitting: Counselor

## 2020-11-11 DIAGNOSIS — M5412 Radiculopathy, cervical region: Secondary | ICD-10-CM | POA: Diagnosis not present

## 2020-11-20 ENCOUNTER — Other Ambulatory Visit: Payer: Self-pay

## 2020-11-20 ENCOUNTER — Ambulatory Visit (INDEPENDENT_AMBULATORY_CARE_PROVIDER_SITE_OTHER): Payer: Medicare Other | Admitting: Family Medicine

## 2020-11-20 VITALS — BP 150/68 | HR 77 | Ht 68.0 in | Wt 252.0 lb

## 2020-11-20 DIAGNOSIS — K219 Gastro-esophageal reflux disease without esophagitis: Secondary | ICD-10-CM | POA: Diagnosis not present

## 2020-11-20 DIAGNOSIS — Z7901 Long term (current) use of anticoagulants: Secondary | ICD-10-CM | POA: Diagnosis not present

## 2020-11-20 DIAGNOSIS — G4730 Sleep apnea, unspecified: Secondary | ICD-10-CM

## 2020-11-20 NOTE — Patient Instructions (Signed)
  Medication Instructions:  Your physician recommends that you continue on your current medications as directed. Please refer to the Current Medication list given to you today. --If you need a refill on any your medications before your next appointment, please call your pharmacy first. If no refills are authorized on file call the office.--  Referrals/Procedures/Imaging: A referral has been placed for you to [facility] for evaluation and treatment. Someone from the scheduling department will be in contact with you in regards to coordinating your procedure. If you do not hear from any of the schedulers within 7-10 business days please give their office a call at [phone]. Their endoscopy office hours are Monday - Friday 7:30 am - 6:00 pm  [facility] [place], [place] [address] Phone: [phone]   Follow-Up: Your next appointment:   Your physician recommends that you schedule a follow-up appointment in: 30 WEEKS with Dr. de [place]  Thanks for letting us be apart of your health journey!!  Primary Care and Sports Medicine   Dr. Arlina Robes [place]   We encourage you to activate your patient portal called "MyChart".  Sign up information is provided on this After Visit Summary.  MyChart is used to connect with patients for Virtual Visits (Telemedicine).  Patients are able to view lab/test results, encounter notes, upcoming appointments, etc.  Non-urgent messages can be sent to your provider as well. To learn more about what you can do with MyChart, please visit --  [email or web address].

## 2020-11-20 NOTE — Assessment & Plan Note (Signed)
Has resumed warfarin following spinal procedure No bleeding issues reported INR checked today - level is 2.0 today Continue with current dosing Plan for recheck in 1 week If still within range at that time, then can space out INR check to 2 weeks

## 2020-11-20 NOTE — Assessment & Plan Note (Addendum)
Currently with some epigastric pain, some radiation to his back Has been taking omeprazole for about 20 years at current dosing Pain had been more intermittent in the past, but recently has become more frequent Has had EGD in the past, thinks this was about 5 years ago Given progression of symptoms despite PPI therapy, will refer to GI for further evaluation, consideration of EGD if indicated

## 2020-11-20 NOTE — Progress Notes (Signed)
    Procedures performed today:    None.  Independent interpretation of notes and tests performed by another provider:   None.  Brief History, Exam, Impression, and Recommendations:    BP (!) 150/68   Pulse 77   Ht '5\' 8"'$  (1.727 m)   Wt 252 lb (114.3 kg)   SpO2 97%   BMI 38.32 kg/m   Chronic anticoagulation Has resumed warfarin following spinal procedure No bleeding issues reported INR checked today - level is 2.0 today Continue with current dosing Plan for recheck in 1 week If still within range at that time, then can space out INR check to 2 weeks  GERD (gastroesophageal reflux disease) Currently with some epigastric pain, some radiation to his back Has been taking omeprazole for about 20 years at current dosing Pain had been more intermittent in the past, but recently has become more frequent Has had EGD in the past, thinks this was about 5 years ago Given progression of symptoms despite PPI therapy, will refer to GI for further evaluation, consideration of EGD if indicated  Sleep apnea By history, diagnosis of OSA in the past - reports that he weighed close to 280 lbs when diagnosed in the past. Did not tolerate use of CPAP supposedly Epworth SS completed today with score of 14 which indicates moderately excessive daytime sleepiness. Concern for underlying OSA given symptoms Discussed options with patient, he will work on lifestyle modifications to work towards gradual weight loss as he has not tolerated CPAP in the past and thus does not want to proceed with sleep study for further evaluation as he does not think he would be compliant with CPAP Did discuss risks related to untreated sleep apnea  Plan for follow-up in about 6 weeks or sooner as needed Need to also check A1c as patient has not had it checked in some time. Had held off as patient was possibly going [facility] but does not appear that this was the  case   ___________________________________________ Troy Nevills [place], MD, ABFM, CAQSM Primary Care and [place]

## 2020-11-25 DIAGNOSIS — G4730 Sleep apnea, unspecified: Secondary | ICD-10-CM | POA: Insufficient documentation

## 2020-11-25 LAB — POCT INR: INR: 2 (ref 2.0–3.0)

## 2020-11-25 NOTE — Assessment & Plan Note (Signed)
By history, diagnosis of OSA in the past - reports that he weighed close to 280 lbs when diagnosed in the past. Did not tolerate use of CPAP supposedly Epworth SS completed today with score of 14 which indicates moderately excessive daytime sleepiness. Concern for underlying OSA given symptoms Discussed options with patient, he will work on lifestyle modifications to work towards gradual weight loss as he has not tolerated CPAP in the past and thus does not want to proceed with sleep study for further evaluation as he does not think he would be compliant with CPAP Did discuss risks related to untreated sleep apnea

## 2020-12-03 DIAGNOSIS — R030 Elevated blood-pressure reading, without diagnosis of hypertension: Secondary | ICD-10-CM | POA: Diagnosis not present

## 2020-12-03 DIAGNOSIS — Z6830 Body mass index (BMI) 30.0-30.9, adult: Secondary | ICD-10-CM | POA: Diagnosis not present

## 2020-12-05 ENCOUNTER — Encounter: Payer: Self-pay | Admitting: Gastroenterology

## 2020-12-27 ENCOUNTER — Encounter: Payer: Medicare Other | Admitting: Counselor

## 2020-12-30 ENCOUNTER — Other Ambulatory Visit (HOSPITAL_BASED_OUTPATIENT_CLINIC_OR_DEPARTMENT_OTHER): Payer: Self-pay | Admitting: Family Medicine

## 2021-01-01 ENCOUNTER — Ambulatory Visit (HOSPITAL_BASED_OUTPATIENT_CLINIC_OR_DEPARTMENT_OTHER): Payer: Medicare Other | Admitting: Family Medicine

## 2021-01-03 ENCOUNTER — Other Ambulatory Visit: Payer: Medicare Other

## 2021-01-03 ENCOUNTER — Encounter: Payer: Self-pay | Admitting: Gastroenterology

## 2021-01-03 ENCOUNTER — Telehealth: Payer: Self-pay

## 2021-01-03 ENCOUNTER — Ambulatory Visit (INDEPENDENT_AMBULATORY_CARE_PROVIDER_SITE_OTHER): Payer: Medicare Other | Admitting: Gastroenterology

## 2021-01-03 VITALS — BP 130/70 | HR 68 | Ht 68.0 in | Wt 245.2 lb

## 2021-01-03 DIAGNOSIS — R1013 Epigastric pain: Secondary | ICD-10-CM | POA: Diagnosis not present

## 2021-01-03 DIAGNOSIS — Z1211 Encounter for screening for malignant neoplasm of colon: Secondary | ICD-10-CM | POA: Diagnosis not present

## 2021-01-03 MED ORDER — FAMOTIDINE 20 MG PO TABS
20.0000 mg | ORAL_TABLET | Freq: Every day | ORAL | 1 refills | Status: DC
Start: 2021-01-03 — End: 2021-05-27

## 2021-01-03 NOTE — Progress Notes (Signed)
HPI : [name] is a pleasant 75 year old with a history of atrial fibrillation and chronic kidney disease referred to [place] by Dr. Arlina Robes [place] for further evaluation of GERD symptoms.  Patient states that he has had problems with chronic upper GI symptoms for probably 20 years.  His symptoms have waxed and waned over that time.  Recently, his symptoms seem to be much more bothersome.  The patient complains specifically of significant upper abdominal discomfort, bloating and distention after meals.  Symptoms are worse with certain foods.  He is no longer able to tolerate many of the foods he enjoys such as spaghetti, pizza, shrimp and any spicy foods.  He reports the feeling of his "stomach rejecting everything I ate when he eats these foods.  This abdominal pain sometimes radiates into his back neck and shoulders.  He has nausea but no vomiting.  He rarely reports any burning sensation in his chest or acid regurgitation.  No dysphagia.  Symptoms are often worse in the evening and at night.  He has been taking omeprazole daily for several years now.  He has not tried stopping it.  He rarely takes any NSAIDs.  He had gained weight few years ago, but was able to lose about 12 pounds over this past summer due to changes in his diet. He underwent an upper endoscopy in 2019 to evaluate these symptoms which was unremarkable.  No biopsy specimens obtained. His last colonoscopy was in 2012 at the [place], and that was normal per patient.  He denies any chronic lower GI symptoms to include constipation, diarrhea or blood in the stool.       Past Medical History:  Diagnosis Date   Atrial fibrillation (HCC)    Chronic anticoagulation    Complication of anesthesia    ' hard time waking up" per patient    Diabetes mellitus    type 2   GERD (gastroesophageal reflux disease)    Hyperlipidemia    Hypertension    Mitral regurgitation    a. Echo (09/2011):  EF 55%, mod MR, severe LAE, severe RAE, PASP 57-61,  trivial eff   Permanent atrial fibrillation (HCC)    chronic atrial fib   Pulmonary hypertension ([place])    Pulmonary hypertension ([place])      Past Surgical History:  Procedure Laterality Date   CARDIOVERSION     CHOLECYSTECTOMY N/A 03/01/2015   Procedure: LAPAROSCOPIC CHOLECYSTECTOMY;  Surgeon: Ralene Ok, MD;  Location: [facility];  Service: General;  Laterality: N/A;   spur on heel     Family History  Problem Relation Age of Onset   Hypertension Mother    Alzheimer's disease Mother    Heart disease Father        cabgx2, respiratory failure, pacemaker   Social History   Tobacco Use   Smoking status: Never   Smokeless tobacco: Never  Vaping Use   Vaping Use: Never used  Substance Use Topics   Alcohol use: Yes    Comment: margarita    Drug use: No   Current Outpatient Medications  Medication Sig Dispense Refill   acetaminophen (TYLENOL) 500 MG tablet Take 1,000 mg by mouth every 6 (six) hours as needed for mild pain.     allopurinol (ZYLOPRIM) 100 MG tablet Take 1 tablet (100 mg total) by mouth 2 (two) times daily. 60 tablet 1   furosemide (LASIX) 20 MG tablet Take 20 mg by mouth every other day.     Glucose Blood (  FREESTYLE LITE TEST VI)      lidocaine (LIDODERM) 5 % Place 1 patch onto the skin daily. Remove & Discard patch within 12 hours or as directed by MD     metoprolol (LOPRESSOR) 50 MG tablet Take 50 mg by mouth 2 (two) times daily.      omeprazole (PRILOSEC) 40 MG capsule Take by mouth daily.  2   simvastatin (ZOCOR) 20 MG tablet Take 20 mg by mouth at bedtime.     tadalafil (CIALIS) 5 MG tablet TAKE ONE TABLET BY MOUTH DAILY 30 tablet 1   warfarin (COUMADIN) 2 MG tablet Take 1 tablet (2 mg total) by mouth every evening. 30 tablet 1   No current facility-administered medications for this visit.   No Known Allergies   Review of Systems: All systems reviewed and negative except where noted in HPI.    No results found.  Physical Exam: Vitals reviewed,  unremarkable Constitutional: Pleasant,well-developed, Caucasian male in no acute distress. HEENT: Normocephalic and atraumatic. Conjunctivae are normal. No scleral icterus. Cardiovascular: Normal rate, regular rhythm.  Pulmonary/chest: Effort normal and breath sounds normal. No wheezing, rales or rhonchi. Abdominal: Soft, nondistended, nontender. Bowel sounds active throughout. There are no masses palpable. No hepatomegaly. Extremities: Trace edema, venous stasis changes, hyperpigmentation Neurological: Alert and oriented to person place and time. Skin: Skin is warm and dry. No rashes noted. Psychiatric: Normal mood and affect. Behavior is normal.  CBC    Component Value Date/Time   WBC 9.6 08/28/2016 0659   RBC 3.15 (L) 08/28/2016 0659   HGB 10.4 (L) 08/28/2016 0659   HCT 32.1 (L) 08/28/2016 0659   PLT 170 08/28/2016 0659   MCV 101.9 (H) 08/28/2016 0659   MCH 33.0 08/28/2016 0659   MCHC 32.4 08/28/2016 0659   RDW 14.6 08/28/2016 0659   LYMPHSABS 2.0 08/27/2016 1851   MONOABS 0.8 08/27/2016 1851   EOSABS 0.3 08/27/2016 1851   BASOSABS 0.0 08/27/2016 1851    CMP     Component Value Date/Time   NA 141 08/28/2016 0905   K 5.6 (H) 08/28/2016 0905   CL 115 (H) 08/28/2016 0905   CO2 21 (L) 08/28/2016 0905   GLUCOSE 178 (H) 08/28/2016 0905   BUN 65 (H) 08/28/2016 0905   CREATININE 2.63 (H) 08/28/2016 0905   CALCIUM 9.0 08/28/2016 0905   PROT 7.2 08/27/2016 1851   ALBUMIN 4.2 08/27/2016 1851   AST 16 08/27/2016 1851   ALT 14 (L) 08/27/2016 1851   ALKPHOS 49 08/27/2016 1851   BILITOT 0.6 08/27/2016 1851   GFRNONAA 23 (L) 08/28/2016 0905   GFRAA 27 (L) 08/28/2016 0905     ASSESSMENT AND PLAN: 75 year old male with longstanding chronic upper GI symptoms, most consistent with dyspepsia characterized by significant abdominal bloating, distention and abdominal malaise.  He does not have very much in the way of typical GERD symptoms such as heartburn or acid regurgitation.  He  had a normal upper endoscopy 3 years ago to evaluate the symptoms.  He has had no significant changes in his diet.  He has been on a PPI for many years and has not tried coming off of it.  There is not a strong indication for him to be on this medicine, and in many cases it can cause symptoms of bloating.  I recommended he try stopping it for a few weeks to see if this improves his symptoms.  In the meantime, we can treat any potential acid related symptoms with as needed famotidine.  I do not think repeating an upper endoscopy at this point is worthwhile.  We could consider other tests such as hydrogen breath testing, but I think a trial off PPI would be the first, cheapest and easiest test.  We will also test for H. pylori stool antigen after he has been off omeprazole for 2 weeks.  Follow-up in 6 to 8 weeks, and if no improvement in symptoms we can consider breath testing  Dyspepsia/bloating - Trial off PPI, use Pepcid as needed - H. pylori stool antigen 2 weeks after stopping PPI - Follow-up 6 to 8 weeks, consider breath testing if no improvement  Colon cancer screening - Schedule for routine screening colonoscopy - Recommend stopping Coumadin 5 days before procedure, will send letter to cardiologist   de [place], Blondell Reveal, MD

## 2021-01-03 NOTE — Patient Instructions (Signed)
If you are age 74 or older, your body mass index should be between 23-30. Your Body mass index is 37.29 kg/m. If this is out of the aforementioned range listed, please consider follow up with your Primary Care Provider.  If you are age 82 or younger, your body mass index should be between 19-25. Your Body mass index is 37.29 kg/m. If this is out of the aformentioned range listed, please consider follow up with your Primary Care Provider.   Your provider has requested that you go to the basement level for lab work before leaving today. Press "B" on the elevator. The lab is located at the first door on the left as you exit the elevator.   It has been recommended to you by your physician that you have a(n) Endoscopy completed. Per your request, we did not schedule the procedure(s) today. Please contact our office at [phone] should you decide to have the procedure completed. You will be scheduled for a pre-visit and procedure at that time.  We have sent the following medications to your pharmacy for you to pick up at your convenience: Pepcid 20 mg.  Stop Omeprazole.  The [facility] providers would like to encourage you to use [facility] to communicate with providers for non-urgent requests or questions.  Due to long hold times on the telephone, sending your provider a message by [facility] may be a faster and more efficient way to get a response.  Please allow 48 business hours for a response.  Please remember that this is for non-urgent requests.

## 2021-01-03 NOTE — Telephone Encounter (Signed)
[facility] Pre-operative Risk Assessment     Request for surgical clearance:     Endoscopy Procedure  What type of surgery is being performed?     Colonoscopy  When is this surgery scheduled?     TBD  What type of clearance is required ?   Pharmacy  Are there any medications that need to be held prior to surgery and how long? Coumadin 5 days.  Practice name and name of physician performing surgery?      [facility]  What is your office phone and fax number?      Phone- [phone]  [phone]  Anesthesia type (None, local, MAC, general) ?       MAC

## 2021-01-06 NOTE — Telephone Encounter (Signed)
Patient with diagnosis of afib on warfarin for anticoagulation.    Procedure: colonoscopy Date of procedure: TBD  CHA2DS2-VASc Score = 3  This indicates a 3.2% annual risk of stroke. The patient's score is based upon: CHF History: 0 HTN History: 1 Diabetes History: 1 Stroke History: 0 Vascular Disease History: 0 Age Score: 1 Gender Score: 0   CrCl 3mL/min using adjusted body weight Platelet count 151K  Per office protocol, patient can hold warfarin for 5 days prior to procedure. Patient will not need bridging with Lovenox (enoxaparin) around procedure. Warfarin is managed by PCP, please forward clearance to them as an FYI that pt will be holding anticoag in the future for procedure.

## 2021-01-08 ENCOUNTER — Ambulatory Visit (HOSPITAL_BASED_OUTPATIENT_CLINIC_OR_DEPARTMENT_OTHER): Payer: Medicare Other | Admitting: Family Medicine

## 2021-01-10 ENCOUNTER — Telehealth: Payer: Self-pay

## 2021-01-10 NOTE — Telephone Encounter (Signed)
Called to let him that he was cleared to hold 5 days prior to his procedure. Patient has not scheduled his Colonoscopy because of family issues he wants to wait to schedule procedure until next year. Patient stated he will call back to schedule.

## 2021-01-14 ENCOUNTER — Ambulatory Visit (INDEPENDENT_AMBULATORY_CARE_PROVIDER_SITE_OTHER): Payer: Medicare Other | Admitting: Family Medicine

## 2021-01-14 ENCOUNTER — Encounter (HOSPITAL_BASED_OUTPATIENT_CLINIC_OR_DEPARTMENT_OTHER): Payer: Self-pay | Admitting: Family Medicine

## 2021-01-14 ENCOUNTER — Other Ambulatory Visit: Payer: Self-pay

## 2021-01-14 ENCOUNTER — Other Ambulatory Visit: Payer: Medicare Other

## 2021-01-14 VITALS — BP 132/66 | HR 64 | Ht 68.0 in | Wt 245.0 lb

## 2021-01-14 DIAGNOSIS — N1832 Chronic kidney disease, stage 3b: Secondary | ICD-10-CM

## 2021-01-14 DIAGNOSIS — Z1211 Encounter for screening for malignant neoplasm of colon: Secondary | ICD-10-CM | POA: Diagnosis not present

## 2021-01-14 DIAGNOSIS — E1122 Type 2 diabetes mellitus with diabetic chronic kidney disease: Secondary | ICD-10-CM

## 2021-01-14 DIAGNOSIS — R1013 Epigastric pain: Secondary | ICD-10-CM

## 2021-01-14 DIAGNOSIS — K219 Gastro-esophageal reflux disease without esophagitis: Secondary | ICD-10-CM | POA: Diagnosis not present

## 2021-01-14 DIAGNOSIS — Z7901 Long term (current) use of anticoagulants: Secondary | ICD-10-CM

## 2021-01-14 NOTE — Patient Instructions (Signed)
  Medication Instructions:  Your physician recommends that you continue on your current medications as directed. Please refer to the Current Medication list given to you today. --If you need a refill on any your medications before your next appointment, please call your pharmacy first. If no refills are authorized on file call the office.-- Lab Work: Your physician has recommended that you have lab work today: A1C, INR, and Urine Micro If you have labs (blood work) drawn today and your tests are completely normal, you will receive your results via [place] a phone call from our staff.  Please ensure you check your voicemail in the event that you authorized detailed messages to be left on a delegated number. If you have any lab test that is abnormal or we need to change your treatment, we will call you to review the results.  Follow-Up: Your next appointment:   Your physician recommends that you schedule a follow-up appointment in: 4-6 WEEKS with Dr. de [place]  You will receive a text message or e-mail with a link to a survey about your care and experience with [place] today! We would greatly appreciate your feedback!   Thanks for letting us be apart of your health journey!!  Primary Care and Sports Medicine   Dr. Arlina Robes [place]   We encourage you to activate your patient portal called "MyChart".  Sign up information is provided on this After Visit Summary.  MyChart is used to connect with patients for Virtual Visits (Telemedicine).  Patients are able to view lab/test results, encounter notes, upcoming appointments, etc.  Non-urgent messages can be sent to your provider as well. To learn more about what you can do with MyChart, please visit --  [email or web address].

## 2021-01-14 NOTE — Assessment & Plan Note (Signed)
Patient evaluated with GI Was started on famotidine with discontinuation of PPI Feels that symptoms have improved since medication change Patient also indicates that stool testing was done, which in discussing sounds like testing for H. pylori which patient reports was normal Recommend continuing with medications as indicated by GI with follow-up as instructed

## 2021-01-14 NOTE — Assessment & Plan Note (Signed)
Currently managed on warfarin with INR therapeutic goal of 2.0-3.0 Is in need of recheck of INR today Will adjust dosing of warfarin as indicated based on INR result

## 2021-01-14 NOTE — Assessment & Plan Note (Addendum)
Patient due for recheck of hemoglobin A1c as well as urine microalbumin/creatinine ratio He continues with current medication of glipizide Labs to be drawn today, further management of medications once A1c obtained

## 2021-01-14 NOTE — Progress Notes (Signed)
    Procedures performed today:    None.  Independent interpretation of notes and tests performed by another provider:   None.  Brief History, Exam, Impression, and Recommendations:    BP 132/66   Pulse 64   Ht 5\' 8"  (1.727 m)   Wt 245 lb (111.1 kg)   SpO2 98%   BMI 37.25 kg/m   GERD (gastroesophageal reflux disease) Patient evaluated with GI Was started on famotidine with discontinuation of PPI Feels that symptoms have improved since medication change Patient also indicates that stool testing was done, which in discussing sounds like testing for H. pylori which patient reports was normal Recommend continuing with medications as indicated by GI with follow-up as instructed  DM2 (diabetes mellitus, type 2) ([place]) Patient due for recheck of hemoglobin A1c as well as urine microalbumin/creatinine ratio He continues with current medication of glipizide Labs to be drawn today, further management of medications once A1c obtained  Chronic anticoagulation Currently managed on warfarin with INR therapeutic goal of 2.0-3.0 Is in need of recheck of INR today Will adjust dosing of warfarin as indicated based on INR result  Plan for follow-up in 6 to 8 weeks or sooner as needed or as indicated by lab results   ___________________________________________ Shalaina Guardiola de [place], MD, ABFM, CAQSM Primary Care and [place]

## 2021-01-15 ENCOUNTER — Telehealth (HOSPITAL_BASED_OUTPATIENT_CLINIC_OR_DEPARTMENT_OTHER): Payer: Self-pay

## 2021-01-15 DIAGNOSIS — Z7901 Long term (current) use of anticoagulants: Secondary | ICD-10-CM

## 2021-01-15 DIAGNOSIS — I4821 Permanent atrial fibrillation: Secondary | ICD-10-CM

## 2021-01-15 LAB — PROTIME-INR
INR: 1.9 — ABNORMAL HIGH (ref 0.9–1.2)
Prothrombin Time: 19.7 s — ABNORMAL HIGH (ref 9.1–12.0)

## 2021-01-15 LAB — HEMOGLOBIN A1C
Est. average glucose Bld gHb Est-mCnc: 143 mg/dL
Hgb A1c MFr Bld: 6.6 % — ABNORMAL HIGH (ref 4.8–5.6)

## 2021-01-15 NOTE — Telephone Encounter (Signed)
Noted  

## 2021-01-15 NOTE — Telephone Encounter (Signed)
-----   Message from Raymond J de [place], MD sent at 01/15/2021 12:01 PM EDT ----- Hemoglobin A1c is at goal at 6.6%.  INR is slightly below desired range at 1.9.  Given this, would continue with current dosing of warfarin and plan to recheck INR in 1 week.  INR recheck can be completed at nurse visit.

## 2021-01-15 NOTE — Telephone Encounter (Signed)
Pt is aware and agreeable to lab results and recommendations Pt stated he would call the office to schedule nurse visit for next week to recheck INR

## 2021-01-16 LAB — H. PYLORI ANTIGEN, STOOL: H pylori Ag, Stl: NEGATIVE

## 2021-01-20 NOTE — Progress Notes (Signed)
Mr. Fullilove, your H. Pylori test was negative.  Is your bloating any better since stopping the PPI and starting famotidine?

## 2021-01-22 ENCOUNTER — Other Ambulatory Visit: Payer: Self-pay

## 2021-01-22 ENCOUNTER — Ambulatory Visit (INDEPENDENT_AMBULATORY_CARE_PROVIDER_SITE_OTHER): Payer: Medicare Other | Admitting: Family Medicine

## 2021-01-22 DIAGNOSIS — E1122 Type 2 diabetes mellitus with diabetic chronic kidney disease: Secondary | ICD-10-CM | POA: Diagnosis not present

## 2021-01-22 DIAGNOSIS — Z7901 Long term (current) use of anticoagulants: Secondary | ICD-10-CM

## 2021-01-22 DIAGNOSIS — N1832 Chronic kidney disease, stage 3b: Secondary | ICD-10-CM

## 2021-01-22 DIAGNOSIS — I4821 Permanent atrial fibrillation: Secondary | ICD-10-CM

## 2021-01-22 NOTE — Addendum Note (Signed)
Addended by: Bobby Rumpf C on: 01/22/2021 01:09 PM   Modules accepted: Orders

## 2021-01-23 DIAGNOSIS — Z20822 Contact with and (suspected) exposure to covid-19: Secondary | ICD-10-CM | POA: Diagnosis not present

## 2021-01-23 LAB — POCT UA - MICROALBUMIN
Creatinine, POC: 300 mg/dL
Microalbumin Ur, POC: 150 mg/L

## 2021-01-23 LAB — PROTIME-INR
INR: 1.9 — ABNORMAL HIGH (ref 0.9–1.2)
Prothrombin Time: 19.3 s — ABNORMAL HIGH (ref 9.1–12.0)

## 2021-01-24 ENCOUNTER — Telehealth (HOSPITAL_BASED_OUTPATIENT_CLINIC_OR_DEPARTMENT_OTHER): Payer: Self-pay

## 2021-01-24 MED ORDER — WARFARIN SODIUM 1 MG PO TABS
ORAL_TABLET | ORAL | 1 refills | Status: DC
Start: 2021-01-24 — End: 2021-07-01

## 2021-01-24 NOTE — Telephone Encounter (Signed)
Rx sent for 1 mg of warfarin to be added to current dosing one day a week

## 2021-01-24 NOTE — Telephone Encounter (Signed)
-----   Message from Raymond J de [place], MD sent at 01/23/2021  4:34 PM EST ----- INR continues to be just below therapeutic range.  Due to this, would continue with 2 mg tablet daily, however would add 1 day of taking an additional 1 mg tablet.  Please send prescription for 1 mg tablet to be added to current regimen on 1 day of the week

## 2021-02-24 DIAGNOSIS — M5412 Radiculopathy, cervical region: Secondary | ICD-10-CM | POA: Diagnosis not present

## 2021-02-25 ENCOUNTER — Telehealth (HOSPITAL_BASED_OUTPATIENT_CLINIC_OR_DEPARTMENT_OTHER): Payer: Self-pay

## 2021-02-25 DIAGNOSIS — Z7901 Long term (current) use of anticoagulants: Secondary | ICD-10-CM

## 2021-02-25 NOTE — Telephone Encounter (Signed)
Patient called in to inform us that he has an epidural injection from [facility] and had to be off his coumadin from 12/07-12/13.  Patient would like to have INR checked to make sure his levels are regulated after procedure Order placed for INR Patient is agreeable to having blood work completed the week of 12/27

## 2021-02-26 DIAGNOSIS — I8312 Varicose veins of left lower extremity with inflammation: Secondary | ICD-10-CM | POA: Diagnosis not present

## 2021-02-26 DIAGNOSIS — I8311 Varicose veins of right lower extremity with inflammation: Secondary | ICD-10-CM | POA: Diagnosis not present

## 2021-03-13 ENCOUNTER — Ambulatory Visit (HOSPITAL_BASED_OUTPATIENT_CLINIC_OR_DEPARTMENT_OTHER): Payer: Medicare Other

## 2021-03-13 ENCOUNTER — Other Ambulatory Visit: Payer: Self-pay

## 2021-03-13 ENCOUNTER — Other Ambulatory Visit (HOSPITAL_BASED_OUTPATIENT_CLINIC_OR_DEPARTMENT_OTHER): Payer: Self-pay | Admitting: Family Medicine

## 2021-03-13 DIAGNOSIS — Z7901 Long term (current) use of anticoagulants: Secondary | ICD-10-CM | POA: Diagnosis not present

## 2021-03-13 DIAGNOSIS — N1832 Chronic kidney disease, stage 3b: Secondary | ICD-10-CM | POA: Diagnosis not present

## 2021-03-14 LAB — PROTIME-INR
INR: 1.7 — ABNORMAL HIGH (ref 0.9–1.2)
Prothrombin Time: 17.8 s — ABNORMAL HIGH (ref 9.1–12.0)

## 2021-03-16 ENCOUNTER — Other Ambulatory Visit (HOSPITAL_BASED_OUTPATIENT_CLINIC_OR_DEPARTMENT_OTHER): Payer: Self-pay | Admitting: Family Medicine

## 2021-03-18 DIAGNOSIS — D631 Anemia in chronic kidney disease: Secondary | ICD-10-CM | POA: Diagnosis not present

## 2021-03-18 DIAGNOSIS — I129 Hypertensive chronic kidney disease with stage 1 through stage 4 chronic kidney disease, or unspecified chronic kidney disease: Secondary | ICD-10-CM | POA: Diagnosis not present

## 2021-03-18 DIAGNOSIS — Z23 Encounter for immunization: Secondary | ICD-10-CM | POA: Diagnosis not present

## 2021-03-18 DIAGNOSIS — N1832 Chronic kidney disease, stage 3b: Secondary | ICD-10-CM | POA: Diagnosis not present

## 2021-03-18 DIAGNOSIS — N2581 Secondary hyperparathyroidism of renal origin: Secondary | ICD-10-CM | POA: Diagnosis not present

## 2021-03-27 ENCOUNTER — Other Ambulatory Visit: Payer: Self-pay

## 2021-03-27 ENCOUNTER — Ambulatory Visit (INDEPENDENT_AMBULATORY_CARE_PROVIDER_SITE_OTHER): Payer: Medicare Other | Admitting: Family Medicine

## 2021-03-27 ENCOUNTER — Other Ambulatory Visit (HOSPITAL_BASED_OUTPATIENT_CLINIC_OR_DEPARTMENT_OTHER): Payer: Self-pay | Admitting: Family Medicine

## 2021-03-27 DIAGNOSIS — Z7901 Long term (current) use of anticoagulants: Secondary | ICD-10-CM | POA: Diagnosis not present

## 2021-03-27 NOTE — Progress Notes (Signed)
° °  Established Patient Nurse Visit  ------------------------------------------------------------------------------------------  Patient presents in office today for INR check Lab requisition printed and given to [place] Patient labs drawn by [employer]

## 2021-03-28 LAB — PROTIME-INR
INR: 1.8 — ABNORMAL HIGH (ref 0.9–1.2)
Prothrombin Time: 18.8 s — ABNORMAL HIGH (ref 9.1–12.0)

## 2021-04-01 ENCOUNTER — Other Ambulatory Visit (HOSPITAL_BASED_OUTPATIENT_CLINIC_OR_DEPARTMENT_OTHER): Payer: Self-pay

## 2021-04-01 MED ORDER — WARFARIN SODIUM 2 MG PO TABS: 2.0000 mg | ORAL_TABLET | Freq: Every evening | ORAL | 1 refills | Status: DC

## 2021-04-10 ENCOUNTER — Ambulatory Visit (HOSPITAL_BASED_OUTPATIENT_CLINIC_OR_DEPARTMENT_OTHER): Payer: Medicare Other

## 2021-04-11 ENCOUNTER — Other Ambulatory Visit: Payer: Self-pay

## 2021-04-11 ENCOUNTER — Ambulatory Visit (INDEPENDENT_AMBULATORY_CARE_PROVIDER_SITE_OTHER): Payer: Medicare Other | Admitting: Nurse Practitioner

## 2021-04-11 ENCOUNTER — Encounter (HOSPITAL_BASED_OUTPATIENT_CLINIC_OR_DEPARTMENT_OTHER): Payer: Self-pay | Admitting: Nurse Practitioner

## 2021-04-11 VITALS — BP 138/82 | HR 89 | Ht 68.0 in | Wt 245.0 lb

## 2021-04-11 DIAGNOSIS — U071 COVID-19: Secondary | ICD-10-CM | POA: Diagnosis not present

## 2021-04-11 MED ORDER — GUAIFENESIN ER 600 MG PO TB12: 1200.0000 mg | ORAL_TABLET | Freq: Two times a day (BID) | ORAL | 1 refills | Status: DC | PRN

## 2021-04-11 MED ORDER — BENZONATATE 200 MG PO CAPS: 200.0000 mg | ORAL_CAPSULE | Freq: Three times a day (TID) | ORAL | 0 refills | Status: DC | PRN

## 2021-04-11 MED ORDER — MOLNUPIRAVIR EUA 200MG CAPSULE: 4.0000 | ORAL_CAPSULE | Freq: Two times a day (BID) | ORAL | 0 refills | Status: AC

## 2021-04-11 MED ORDER — FLUTICASONE PROPIONATE 50 MCG/ACT NA SUSP: 2.0000 | Freq: Two times a day (BID) | NASAL | 1 refills | Status: DC

## 2021-04-11 MED ORDER — HYDROCODONE BIT-HOMATROP MBR 5-1.5 MG/5ML PO SOLN: 5.0000 mL | Freq: Every day | ORAL | 0 refills | Status: DC

## 2021-04-11 NOTE — Progress Notes (Signed)
Virtual Visit Encounter  telephone visit.   I connected with  [name] on 04/13/21 at 10:50 AM EST by secure audio and/or video enabled telemedicine application. I verified that I am speaking with the correct person using two identifiers.   I introduced myself as a Designer, jewellery with the practice. The limitations of evaluation and management by telemedicine discussed with the patient and the availability of in person appointments. The patient expressed verbal understanding and consent to proceed.  Participating parties in this visit include: Myself and patient  The patient is: Patient Location: Home I am: Provider Location: Office/Clinic Subjective:    CC and HPI: [name] is a 76 y.o. year old male presenting for new evaluation and treatment of [place]. He endorses symptom onset on Wednesday of this week with significant worsening of symptoms yesterday and today. He is experiencing body aches, congestions, fever, cough at this time. He tells me the cough is one of the worst symptoms as it is interfering with his sleep. He denies shortness of breath or feelings that he is unable to get enough air.   Past medical history, Surgical history, Family history not pertinant except as noted below, Social history, Allergies, and medications have been entered into the medical record, reviewed, and corrections made.   Review of Systems:  All review of systems negative except what is listed in the HPI  Objective:    Alert and oriented x 4 Speaking in clear sentences with no shortness of breath. He is audibly congested No distress.  Impression and Recommendations:    Problem List Items Addressed This Visit     COVID-19 - Primary    Positive home COVID-19 test with active symptoms. No alarm symptoms present at this time.  Chart review reveals no current labs tests for kidney function, therefore unable to prescribe paxlovid.  Discussion of OTC medications that may be helpful  for symptoms and use of molnupiravir for antiviral properties to help reduce chances of severe progression.  He is agreeable to this.  Will send recommended medications via prescription so that patient can get these from the pharmacy. Recommend increased hydration and rest. Monitor for shortness of breath or symptoms of respiratory compromise.  Will send hycodan for nighttime use for severe cough and tessalon for daytime cough symptoms.  Follow-up if symptoms worsen or fail to improve.       Relevant Medications   benzonatate (TESSALON) 200 MG capsule   guaiFENesin (MUCINEX) 600 MG 12 hr tablet   fluticasone (FLONASE) 50 MCG/ACT nasal spray   HYDROcodone bit-homatropine (HYCODAN) 5-1.5 MG/5ML syrup   molnupiravir EUA (LAGEVRIO) 200 mg CAPS capsule    orders and follow up as documented in EMR I discussed the assessment and treatment plan with the patient. The patient was provided an opportunity to ask questions and all were answered. The patient agreed with the plan and demonstrated an understanding of the instructions.   The patient was advised to call back or seek an in-person evaluation if the symptoms worsen or if the condition fails to improve as anticipated.  Follow-Up: prn  I provided 20 minutes of non-face-to-face interaction with this non face-to-face encounter including intake, same-day documentation, and chart review.   Orma Render, NP , DNP, AGNP-c [place] at [facility] [phone] (fax)

## 2021-04-13 ENCOUNTER — Encounter (HOSPITAL_BASED_OUTPATIENT_CLINIC_OR_DEPARTMENT_OTHER): Payer: Self-pay | Admitting: Nurse Practitioner

## 2021-04-13 NOTE — Assessment & Plan Note (Signed)
Positive home COVID-19 test with active symptoms. No alarm symptoms present at this time.  Chart review reveals no current labs tests for kidney function, therefore unable to prescribe paxlovid.  Discussion of OTC medications that may be helpful for symptoms and use of molnupiravir for antiviral properties to help reduce chances of severe progression.  He is agreeable to this.  Will send recommended medications via prescription so that patient can get these from the pharmacy. Recommend increased hydration and rest. Monitor for shortness of breath or symptoms of respiratory compromise.  Will send hycodan for nighttime use for severe cough and tessalon for daytime cough symptoms.  Follow-up if symptoms worsen or fail to improve.

## 2021-04-14 ENCOUNTER — Ambulatory Visit (HOSPITAL_BASED_OUTPATIENT_CLINIC_OR_DEPARTMENT_OTHER): Payer: Medicare Other

## 2021-04-21 ENCOUNTER — Other Ambulatory Visit (HOSPITAL_BASED_OUTPATIENT_CLINIC_OR_DEPARTMENT_OTHER): Payer: Self-pay | Admitting: Family Medicine

## 2021-04-24 ENCOUNTER — Other Ambulatory Visit: Payer: Self-pay

## 2021-04-24 ENCOUNTER — Ambulatory Visit (HOSPITAL_BASED_OUTPATIENT_CLINIC_OR_DEPARTMENT_OTHER): Payer: Medicare Other

## 2021-04-24 DIAGNOSIS — Z7901 Long term (current) use of anticoagulants: Secondary | ICD-10-CM | POA: Diagnosis not present

## 2021-04-25 LAB — PROTIME-INR
INR: 2.3 — ABNORMAL HIGH (ref 0.9–1.2)
Prothrombin Time: 23.1 s — ABNORMAL HIGH (ref 9.1–12.0)

## 2021-05-01 ENCOUNTER — Telehealth (HOSPITAL_BASED_OUTPATIENT_CLINIC_OR_DEPARTMENT_OTHER): Payer: Self-pay

## 2021-05-01 DIAGNOSIS — Z7901 Long term (current) use of anticoagulants: Secondary | ICD-10-CM

## 2021-05-01 NOTE — Telephone Encounter (Signed)
-----   Message from Raymond J de [place], MD sent at 04/25/2021 12:18 PM EST ----- INR is within therapeutic range.  Recommend continuing with current dosing of warfarin, plan for repeat INR in 2 weeks

## 2021-05-01 NOTE — Telephone Encounter (Signed)
Patient is aware and agreeable to results. INR will be checked on 02/24 Orders placed and appt scheduled.

## 2021-05-07 ENCOUNTER — Other Ambulatory Visit (HOSPITAL_BASED_OUTPATIENT_CLINIC_OR_DEPARTMENT_OTHER): Payer: Self-pay

## 2021-05-07 MED ORDER — GLIPIZIDE ER 2.5 MG PO TB24: 2.5000 mg | ORAL_TABLET | Freq: Every day | ORAL | 1 refills | Status: DC

## 2021-05-07 NOTE — Telephone Encounter (Signed)
Received fax request for RX

## 2021-05-09 ENCOUNTER — Ambulatory Visit (HOSPITAL_BASED_OUTPATIENT_CLINIC_OR_DEPARTMENT_OTHER): Payer: Medicare Other

## 2021-05-09 ENCOUNTER — Other Ambulatory Visit: Payer: Self-pay

## 2021-05-09 DIAGNOSIS — Z7901 Long term (current) use of anticoagulants: Secondary | ICD-10-CM

## 2021-05-10 LAB — PROTIME-INR
INR: 1.6 — ABNORMAL HIGH (ref 0.9–1.2)
Prothrombin Time: 16.1 s — ABNORMAL HIGH (ref 9.1–12.0)

## 2021-05-17 ENCOUNTER — Other Ambulatory Visit (HOSPITAL_BASED_OUTPATIENT_CLINIC_OR_DEPARTMENT_OTHER): Payer: Self-pay | Admitting: Family Medicine

## 2021-05-22 ENCOUNTER — Telehealth (HOSPITAL_BASED_OUTPATIENT_CLINIC_OR_DEPARTMENT_OTHER): Payer: Self-pay | Admitting: Family Medicine

## 2021-05-22 ENCOUNTER — Telehealth (HOSPITAL_BASED_OUTPATIENT_CLINIC_OR_DEPARTMENT_OTHER): Payer: Self-pay

## 2021-05-26 DIAGNOSIS — Z20828 Contact with and (suspected) exposure to other viral communicable diseases: Secondary | ICD-10-CM | POA: Diagnosis not present

## 2021-05-27 ENCOUNTER — Telehealth: Payer: Self-pay | Admitting: Gastroenterology

## 2021-05-27 MED ORDER — FAMOTIDINE 20 MG PO TABS: 20.0000 mg | ORAL_TABLET | Freq: Every day | ORAL | 1 refills | Status: DC

## 2021-05-27 NOTE — Telephone Encounter (Signed)
Patient called requesting refill on Pepcid ?

## 2021-05-27 NOTE — Telephone Encounter (Signed)
Refill has been sent to pharmacy.  

## 2021-05-29 DIAGNOSIS — Z20822 Contact with and (suspected) exposure to covid-19: Secondary | ICD-10-CM | POA: Diagnosis not present

## 2021-05-31 DIAGNOSIS — Z20822 Contact with and (suspected) exposure to covid-19: Secondary | ICD-10-CM | POA: Diagnosis not present

## 2021-06-09 ENCOUNTER — Other Ambulatory Visit: Payer: Self-pay

## 2021-06-09 ENCOUNTER — Ambulatory Visit (INDEPENDENT_AMBULATORY_CARE_PROVIDER_SITE_OTHER): Payer: Medicare Other | Admitting: Podiatry

## 2021-06-09 DIAGNOSIS — M79675 Pain in left toe(s): Secondary | ICD-10-CM | POA: Diagnosis not present

## 2021-06-09 DIAGNOSIS — M79674 Pain in right toe(s): Secondary | ICD-10-CM

## 2021-06-09 DIAGNOSIS — B351 Tinea unguium: Secondary | ICD-10-CM | POA: Diagnosis not present

## 2021-06-09 NOTE — Progress Notes (Signed)
? ?  Subjective: ?76 y.o. male presenting today for new complaint regarding discoloration and thickening to the bilateral great toenails.  Patient states that he has been applying topical antifungal for about 1 year now with no improvement.  He presents for follow-up treatment and evaluation ? ?Past Medical History:  ?Diagnosis Date  ? Atrial fibrillation ([place])   ? Chronic anticoagulation   ? Complication of anesthesia   ? ' hard time waking up" per patient   ? Diabetes mellitus   ? type 2  ? GERD (gastroesophageal reflux disease)   ? Hyperlipidemia   ? Hypertension   ? Mitral regurgitation   ? a. Echo (09/2011):  EF 55%, mod MR, severe LAE, severe RAE, PASP 57-61, trivial eff  ? Permanent atrial fibrillation ([place])   ? chronic atrial fib  ? Pulmonary hypertension ([place])   ? Pulmonary hypertension ([place])   ? ? ?Objective: ?Physical Exam ?General: The patient is alert and oriented x3 in no acute distress. ? ?Dermatology: Hyperkeratotic, discolored, thickened, onychodystrophy noted 1-5 bilateral. Skin is warm, dry and supple bilateral lower extremities. Negative for open lesions or macerations. ? ?Vascular: Palpable pedal pulses bilaterally. No edema or erythema noted. Capillary refill within normal limits. ? ?Neurological: Epicritic and protective threshold grossly intact bilaterally.  ? ?Musculoskeletal Exam: Range of motion within normal limits to all pedal and ankle joints bilateral. Muscle strength 5/5 in all groups bilateral.  ? ?Assessment: ?#1 Onychomycosis of toenails bilateral ? ?Plan of Care:  ?#1 Patient was evaluated. ?#2  Today we discussed different treatment options including oral, topical, and laser antifungal treatment modalities.  We discussed their efficacies and side effects.  Patient opts for oral antifungal treatment modality ?#3 patient declined oral medication. ?#4 he would like to go home and discuss the cost of laser treatment with his spouse.  He can call into the office and set up an  appointment with our nurse for laser antifungal treatment if he would like to pursue this option ?#5 Tolcylen antifungal topical was dispensed at checkout ?#6 return to clinic as needed ? ?Edrick Kins, DPM ?[place] ? ?Dr. Edrick Kins, DPM  ?  ?[address]                                    ?[place], [place] [address]                ?Office [phone]  ?Fax [phone] ? ? ? ? ?

## 2021-06-10 MED ORDER — METOPROLOL TARTRATE 50 MG PO TABS: 50.0000 mg | ORAL_TABLET | Freq: Two times a day (BID) | ORAL | 0 refills | Status: DC

## 2021-06-10 NOTE — Telephone Encounter (Signed)
Pt called stating he is needing a refill on  ? ?metoprolol (LOPRESSOR) 50 MG tablet [[id number]]  ? ?Pt is needing a PA for this Rx.Pt has already contact pharmacy regarding this. Pt took last pill today. ? ?Pharmacy: [facility] [id number] - [place], [place] ? ?Please advise ?

## 2021-06-10 NOTE — Telephone Encounter (Signed)
Medication refill sent to pharmacy. AS, CMA ?

## 2021-06-16 ENCOUNTER — Other Ambulatory Visit (HOSPITAL_BASED_OUTPATIENT_CLINIC_OR_DEPARTMENT_OTHER): Payer: Self-pay | Admitting: Family Medicine

## 2021-06-18 DIAGNOSIS — Z20822 Contact with and (suspected) exposure to covid-19: Secondary | ICD-10-CM | POA: Diagnosis not present

## 2021-06-23 ENCOUNTER — Other Ambulatory Visit (HOSPITAL_BASED_OUTPATIENT_CLINIC_OR_DEPARTMENT_OTHER): Payer: Self-pay | Admitting: Family Medicine

## 2021-06-26 ENCOUNTER — Encounter (HOSPITAL_BASED_OUTPATIENT_CLINIC_OR_DEPARTMENT_OTHER): Payer: Self-pay | Admitting: Family Medicine

## 2021-06-26 ENCOUNTER — Ambulatory Visit (INDEPENDENT_AMBULATORY_CARE_PROVIDER_SITE_OTHER): Payer: Medicare Other | Admitting: Family Medicine

## 2021-06-26 VITALS — BP 139/83 | HR 70 | Temp 97.7°F | Ht 75.0 in | Wt 246.8 lb

## 2021-06-26 DIAGNOSIS — E785 Hyperlipidemia, unspecified: Secondary | ICD-10-CM | POA: Diagnosis not present

## 2021-06-26 DIAGNOSIS — R600 Localized edema: Secondary | ICD-10-CM | POA: Insufficient documentation

## 2021-06-26 DIAGNOSIS — L02419 Cutaneous abscess of limb, unspecified: Secondary | ICD-10-CM | POA: Insufficient documentation

## 2021-06-26 DIAGNOSIS — E1140 Type 2 diabetes mellitus with diabetic neuropathy, unspecified: Secondary | ICD-10-CM | POA: Insufficient documentation

## 2021-06-26 DIAGNOSIS — N400 Enlarged prostate without lower urinary tract symptoms: Secondary | ICD-10-CM | POA: Diagnosis not present

## 2021-06-26 DIAGNOSIS — E349 Endocrine disorder, unspecified: Secondary | ICD-10-CM | POA: Insufficient documentation

## 2021-06-26 DIAGNOSIS — Z7901 Long term (current) use of anticoagulants: Secondary | ICD-10-CM

## 2021-06-26 DIAGNOSIS — Z789 Other specified health status: Secondary | ICD-10-CM | POA: Insufficient documentation

## 2021-06-26 DIAGNOSIS — M542 Cervicalgia: Secondary | ICD-10-CM | POA: Insufficient documentation

## 2021-06-26 DIAGNOSIS — R609 Edema, unspecified: Secondary | ICD-10-CM | POA: Insufficient documentation

## 2021-06-26 DIAGNOSIS — Z9049 Acquired absence of other specified parts of digestive tract: Secondary | ICD-10-CM | POA: Insufficient documentation

## 2021-06-26 MED ORDER — SIMVASTATIN 20 MG PO TABS: 20.0000 mg | ORAL_TABLET | Freq: Every day | ORAL | 0 refills | Status: DC

## 2021-06-26 MED ORDER — TADALAFIL 5 MG PO TABS: 5.0000 mg | ORAL_TABLET | Freq: Every day | ORAL | 1 refills | Status: DC

## 2021-06-26 NOTE — Progress Notes (Signed)
? ? ?  Procedures performed today:   ? ?None. ? ?Independent interpretation of notes and tests performed by another provider:  ? ?None. ? ?Brief History, Exam, Impression, and Recommendations:   ? ?BP 139/83   Pulse 70   Temp 97.7 ?F (36.5 ?C)   Ht '6\' 3"'$  (1.905 m)   Wt 246 lb 12.8 oz (111.9 kg)   SpO2 98%   BMI 30.85 kg/m?  ? ?Chronic anticoagulation ?Last INR was subtherapeutic, patient has not had changes to medications since last INR check, would be due to check this today. ?He continues with 2 mg tablet each night. He was taking 1 mg tablet one night per week previously but "thought that came to a conclusion" so has only been doing 2 mg nightly ?We will check INR today. Will make adjustments to warfarin based upon results ? ?Hyperlipidemia ?Has been taking simvastatin regularly in the past, however it seems he did run out of this medication at some point and only recently noticed when he was filling his medication dispenser/organizer ?Requesting refill of simvastatin today, refill provided ? ?BPH (benign prostatic hyperplasia) ?Has been taking tadalafil chronically for this. He indicates that the 5 mg dose that he has been on has provided irregular benefit. Reports having discussed with a urologist at one point in the past and being told that he could go up to a 20 mg dose if needed. He does follow with Dr. Posey Pronto of nephrology for chronic kidney disease - most recent GFR 41. ?Did discuss hesitation with increasing dose because of his underlying kidney impairment and that at present I would recommend continuing with current dose and would not look to increase dose at this time. If he is feeling like medication is not adequately controlling symptoms, can consider re-evaluation with Urology to discuss options. ? ?Plan for follow-up in about 2 months ? ? ?___________________________________________ ?Lallie Strahm de [place], MD, ABFM, CAQSM ?Primary Care and Sports Medicine ?[place] ?

## 2021-06-26 NOTE — Assessment & Plan Note (Signed)
Last INR was subtherapeutic, patient has not had changes to medications since last INR check, would be due to check this today. ?He continues with 2 mg tablet each night. He was taking 1 mg tablet one night per week previously but "thought that came to a conclusion" so has only been doing 2 mg nightly ?We will check INR today. Will make adjustments to warfarin based upon results ?

## 2021-06-26 NOTE — Assessment & Plan Note (Addendum)
Has been taking tadalafil chronically for this. He indicates that the 5 mg dose that he has been on has provided irregular benefit. Reports having discussed with a urologist at one point in the past and being told that he could go up to a 20 mg dose if needed. He does follow with Dr. Posey Pronto of nephrology for chronic kidney disease - most recent GFR 41. ?Did discuss hesitation with increasing dose because of his underlying kidney impairment and that at present I would recommend continuing with current dose and would not look to increase dose at this time. If he is feeling like medication is not adequately controlling symptoms, can consider re-evaluation with Urology to discuss options. ?

## 2021-06-26 NOTE — Patient Instructions (Signed)
Erectile Dysfunction °Erectile dysfunction (ED) is the inability to get or keep an erection in order to have sexual intercourse. ED is considered a symptom of an underlying disorder and is not considered a disease. ED may include: °Inability to get an erection. °Lack of enough hardness of the erection to allow penetration. °Loss of erection before sex is finished. °What are the causes? °This condition may be caused by: °Physical causes, such as: °Artery problems. This may include heart disease, high blood pressure, atherosclerosis, and diabetes. °Hormonal problems, such as low testosterone. °Obesity. °Nerve problems. This may include back or pelvic injuries, multiple sclerosis, Parkinson's disease, spinal cord injury, and stroke. °Certain medicines, such as: °Pain relievers. °Antidepressants. °Blood pressure medicines and water pills (diuretics). °Cancer medicines. °Antihistamines. °Muscle relaxants. °Lifestyle factors, such as: °Use of drugs such as marijuana, cocaine, or opioids. °Excessive use of alcohol. °Smoking. °Lack of physical activity or exercise. °Psychological causes, such as: °Anxiety or stress. °Sadness or depression. °Exhaustion. °Fear about sexual performance. °Guilt. °What are the signs or symptoms? °Symptoms of this condition include: °Inability to get an erection. °Lack of enough hardness of the erection to allow penetration. °Loss of the erection before sex is finished. °Sometimes having normal erections, but with frequent unsatisfactory episodes. °Low sexual satisfaction in either partner due to erection problems. °A curved penis occurring with erection. The curve may cause pain, or the penis may be too curved to allow for intercourse. °Never having nighttime or morning erections. °How is this diagnosed? °This condition is often diagnosed by: °Performing a physical exam to find other diseases or specific problems with the penis. °Asking you detailed questions about the problem. °Doing tests,  such as: °Blood tests to check for diabetes mellitus or high cholesterol, or to measure hormone levels. °Other tests to check for underlying health conditions. °An ultrasound exam to check for scarring. °A test to check blood flow to the penis. °Doing a sleep study at home to measure nighttime erections. °How is this treated? °This condition may be treated by: °Medicines, such as: °Medicine taken by mouth to help you achieve an erection (oral medicine). °Hormone replacement therapy to replace low testosterone levels. °Medicine that is injected into the penis. Your health care provider may instruct you how to give yourself these injections at home. °Medicine that is delivered with a short applicator tube. The tube is inserted into the opening at the tip of the penis, which is the opening of the urethra. A tiny pellet of medicine is put in the urethra. The pellet dissolves and enhances erectile function. This is also called MUSE (medicated urethral system for erections) therapy. °Vacuum pump. This is a pump with a ring on it. The pump and ring are placed on the penis and used to create pressure that helps the penis become erect. °Penile implant surgery. In this procedure, you may receive: °An inflatable implant. This consists of cylinders, a pump, and a reservoir. The cylinders can be inflated with a fluid that helps to create an erection, and they can be deflated after intercourse. °A semi-rigid implant. This consists of two silicone rubber rods. The rods provide some rigidity. They are also flexible, so the penis can both curve downward in its normal position and become straight for sexual intercourse. °Blood vessel surgery to improve blood flow to the penis. During this procedure, a blood vessel from a different part of the body is placed into the penis to allow blood to flow around (bypass) damaged or blocked blood vessels. °Lifestyle changes,   such as exercising more, losing weight, and quitting smoking. °Follow  these instructions at home: °Medicines ° °Take over-the-counter and prescription medicines only as told by your health care provider. Do not increase the dosage without first discussing it with your health care provider. °If you are using self-injections, do injections as directed by your health care provider. Make sure you avoid any veins that are on the surface of the penis. After giving an injection, apply pressure to the injection site for 5 minutes. °Talk to your health care provider about how to prevent headaches while taking ED medicines. These medicines may cause a sudden headache due to the increase in blood flow in your body. °General instructions °Exercise regularly, as directed by your health care provider. Work with your health care provider to lose weight, if needed. °Do not use any products that contain nicotine or tobacco. These products include cigarettes, chewing tobacco, and vaping devices, such as e-cigarettes. If you need help quitting, ask your health care provider. °Before using a vacuum pump, read the instructions that come with the pump and discuss any questions with your health care provider. °Keep all follow-up visits. This is important. °Contact a health care provider if: °You feel nauseous. °You are vomiting. °You get sudden headaches while taking ED medicines. °You have any concerns about your sexual health. °Get help right away if: °You are taking oral or injectable medicines and you have an erection that lasts longer than 4 hours. If your health care provider is unavailable, go to the nearest emergency room for evaluation. An erection that lasts much longer than 4 hours can result in permanent damage to your penis. °You have severe pain in your groin or abdomen. °You develop redness or severe swelling of your penis. °You have redness spreading at your groin or lower abdomen. °You are unable to urinate. °You experience chest pain or a rapid heartbeat (palpitations) after taking oral  medicines. °These symptoms may represent a serious problem that is an emergency. Do not wait to see if the symptoms will go away. Get medical help right away. Call your local emergency services (911 in the [place] Do not drive yourself to the hospital. °Summary °Erectile dysfunction (ED) is the inability to get or keep an erection during sexual intercourse. °This condition is diagnosed based on a physical exam, your symptoms, and tests to determine the cause. Treatment varies depending on the cause and may include medicines, hormone therapy, surgery, or a vacuum pump. °You may need follow-up visits to make sure that you are using your medicines or devices correctly. °Get help right away if you are taking or injecting medicines and you have an erection that lasts longer than 4 hours. °This information is not intended to replace advice given to you by your health care provider. Make sure you discuss any questions you have with your health care provider. °Document Revised: 05/29/2020 Document Reviewed: 05/29/2020 °Elsevier Patient Education © 2022 [employer] ° °

## 2021-06-26 NOTE — Assessment & Plan Note (Signed)
Has been taking simvastatin regularly in the past, however it seems he did run out of this medication at some point and only recently noticed when he was filling his medication dispenser/organizer ?Requesting refill of simvastatin today, refill provided ?

## 2021-06-27 LAB — PROTIME-INR
INR: 1.2 (ref 0.9–1.2)
Prothrombin Time: 12.4 s — ABNORMAL HIGH (ref 9.1–12.0)

## 2021-07-01 MED ORDER — WARFARIN SODIUM 1 MG PO TABS: ORAL_TABLET | ORAL | 1 refills | Status: DC

## 2021-07-01 NOTE — Addendum Note (Signed)
Addended by: Mickel Crow on: 07/01/2021 09:49 AM ? ? Modules accepted: Orders ? ?

## 2021-07-02 DIAGNOSIS — Z20822 Contact with and (suspected) exposure to covid-19: Secondary | ICD-10-CM | POA: Diagnosis not present

## 2021-07-07 DIAGNOSIS — M5412 Radiculopathy, cervical region: Secondary | ICD-10-CM | POA: Diagnosis not present

## 2021-07-11 ENCOUNTER — Ambulatory Visit (HOSPITAL_BASED_OUTPATIENT_CLINIC_OR_DEPARTMENT_OTHER): Payer: Medicare Other

## 2021-07-13 ENCOUNTER — Other Ambulatory Visit (HOSPITAL_BASED_OUTPATIENT_CLINIC_OR_DEPARTMENT_OTHER): Payer: Self-pay | Admitting: Family Medicine

## 2021-07-14 ENCOUNTER — Telehealth: Payer: Self-pay | Admitting: Cardiology

## 2021-07-14 NOTE — Telephone Encounter (Signed)
? ?  Name: [name] [facility]  ?DOB: 09/20/1945  ?MRN: [id number] ? ?Primary Cardiologist: Peter Martinique, MD ? ?Chart reviewed as part of pre-operative protocol coverage. Because of [name]'s past medical history and time since last visit, he will require a follow-up in-office visit in order to better assess preoperative cardiovascular risk. Last visit was >1 year ago. ? ?Pre-op covering staff: ?- Please schedule appointment and call patient to inform them. If patient already had an upcoming appointment within acceptable timeframe, please add "pre-op clearance" to the appointment notes so provider is aware. ?- Please contact requesting surgeon's office via preferred method (i.e, phone, fax) to inform them of need for appointment prior to surgery. ? ?This message will also be routed to pharmacy pool for input on holding Coumadin as requested below so that this information is available to the clearing provider at time of patient's appointment. This is not managed by our office but he does appear to be on it for h/o atrial fib so helpful to have the cardiology s/o on this. Preliminarily do not see indication for SBE ppx but can be finalized at upcoming OV. ? ?Charlie Pitter, PA-C  ?07/14/2021, 12:03 PM  ? ?

## 2021-07-14 NOTE — Telephone Encounter (Signed)
? ?  Pre-operative Risk Assessment  ?  ?Patient Name: [name] [facility]  ?DOB: December 02, 1945 ?MRN: [id number]  ? ? ? ?Request for Surgical Clearance   ? ?Procedure:  deep cleaning and biopsy then patient will have a periodontal procedure eventually ? ?Date of Surgery:  Clearance TBD                          ?   ?Surgeon:  Dr. Kae Heller - [email or web address] ?Surgeon's Group or Practice Name:  [facility], Periodontics dept ?Phone number:  [phone] ?Fax number:  no fax number ?  ?Type of Clearance Requested:   ?- Pharmacy:  Hold Warfarin (Coumadin) how long does patient  need to hold this prior to procedure? ?  ?Type of Anesthesia:  Local  ?  ?Additional requests/questions:  Does this patient need antibiotics? ? ?Signed, ?Leah Newnam   ?07/14/2021, 9:46 AM   ?

## 2021-07-15 DIAGNOSIS — Z20822 Contact with and (suspected) exposure to covid-19: Secondary | ICD-10-CM | POA: Diagnosis not present

## 2021-07-15 NOTE — Telephone Encounter (Signed)
Pt has appt 08/04/21 with Dr. Martinique. I s/w the pt he would like to keep the appt with Dr. Martinique. I will update the requesting office.  ?

## 2021-07-15 NOTE — Telephone Encounter (Signed)
Patient with diagnosis of afib on warfarin for anticoagulation.   ? ?Procedure: deep cleaning and biopsy  ?Date of procedure: TBD ? ?CHA2DS2-VASc Score = 3  ? This indicates a 3.2% annual risk of stroke. ?The patient's score is based upon: ?CHF History: 0 ?HTN History: 1 ?Diabetes History: 1 ?Stroke History: 0 ?Vascular Disease History: 0 ?Age Score: 1 ?Gender Score: 0 ?  ?  ? ?Patient does NOT require pre-op antibiotics for dental procedure. ? ?Per office protocol, patient can hold warfarin for 3-5 days prior to procedure.   ? ?Patient will NOT need bridging with Lovenox (enoxaparin) around procedure. ? ?Please send separate clearance request for periodontal procedure and specify the procedure. ? ?

## 2021-07-20 DIAGNOSIS — Z20822 Contact with and (suspected) exposure to covid-19: Secondary | ICD-10-CM | POA: Diagnosis not present

## 2021-07-21 DIAGNOSIS — Z20822 Contact with and (suspected) exposure to covid-19: Secondary | ICD-10-CM | POA: Diagnosis not present

## 2021-07-24 DIAGNOSIS — Z20822 Contact with and (suspected) exposure to covid-19: Secondary | ICD-10-CM | POA: Diagnosis not present

## 2021-07-30 NOTE — Progress Notes (Signed)
[place], [place], [place]  [address] Phone: [phone] Fax:  [phone]  Date:  08/04/2021   ID:  Troy, Norris [birth year], MRN [id number]  PCP:  de Norris, Troy J, MD  Cardiologist:  Dr. Margo Lama Norris     History of Present Illness: [name] is a 76 y.o. male seen for follow up Afib. He has a  hx of permanent AFib, mod MR, pulmonary HTN, HTN, HL, T2DM.  Echo (09/2011):  EF 55%, mod MR, severe LAE, severe RAE, PASP 57-61, trivial eff. Apparently  had Afib dating back to the 1970s while in the service in [place].   He underwent lap cholecystectomy in 2016. In 2018 he was admitted with hyperkalemia and AKI that responded to hydration.   On follow up today he is doing well.  No dizziness or syncope. No SOB or chest pain. Reports coumadin has been doing well.  He states he is doing well. Active doing yard work. He goes to the gym a couple of times a week. Weight is up and down.  Generally feels well.   Recent Labs: No results found for requested labs within last 8760 hours.  Wt Readings from Last 3 Encounters:  08/04/21 253 lb (114.8 kg)  06/26/21 246 lb 12.8 oz (111.9 kg)  04/11/21 245 lb (111.1 kg)     Past Medical History:  Diagnosis Date   Atrial fibrillation (HCC)    Chronic anticoagulation    Complication of anesthesia    ' hard time waking up" per patient    Diabetes mellitus    type 2   GERD (gastroesophageal reflux disease)    Hyperlipidemia    Hypertension    Mitral regurgitation    a. 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HCC)    chronic atrial fib   Pulmonary hypertension (HCC)    Pulmonary hypertension (HCC)     Current Outpatient Medications  Medication Sig Dispense Refill   acetaminophen (TYLENOL) 500 MG tablet Take 1,000 mg by mouth as needed for mild pain.     allopurinol (ZYLOPRIM) 100 MG tablet TAKE ONE TABLET BY MOUTH TWICE A DAY 60 tablet 1    famotidine (PEPCID) 20 MG tablet Take 1 tablet (20 mg total) by mouth daily. 90 tablet 1   fluticasone (FLONASE) 50 MCG/ACT nasal spray Place 2 sprays into both nostrils 2 (two) times daily. While having covid symptoms. 9.9 mL 1   furosemide (LASIX) 20 MG tablet Take 20 mg by mouth every other day.     glipiZIDE (GLUCOTROL XL) 2.5 MG 24 hr tablet Take 1 tablet (2.5 mg total) by mouth daily with breakfast. 90 tablet 1   Glucose Blood (FREESTYLE LITE TEST VI)      guaiFENesin (MUCINEX) 600 MG 12 hr tablet Take 2 tablets (1,200 mg total) by mouth 2 (two) times daily as needed. 30 tablet 1   lidocaine (LIDODERM) 5 % Place 1 patch onto the skin as needed. Remove & Discard patch within 12 hours or as directed by MD     metoprolol tartrate (LOPRESSOR) 50 MG tablet Take 1 tablet (50 mg total) by mouth 2 (two) times daily. 180 tablet 0   omeprazole (PRILOSEC) 40 MG capsule Take by mouth daily.  2   simvastatin (ZOCOR) 20 MG tablet Take 1 tablet (20 mg total) by mouth at bedtime. 90 tablet 0   tadalafil (CIALIS) 5  MG tablet Take 1 tablet (5 mg total) by mouth daily. 30 tablet 1   warfarin (COUMADIN) 1 MG tablet Take 1 tablet ('1mg'$ ) one day a week in addition to 2 mg tablet 5 tablet 1   warfarin (COUMADIN) 2 MG tablet TAKE ONE TABLET BY MOUTH EVERY EVENING 30 tablet 1   No current facility-administered medications for this visit.    Allergies:   Atorvastatin and Lisinopril   Social History:  The patient  reports that he has never smoked. He has never used smokeless tobacco. He reports that he does not currently use alcohol. He reports that he does not use drugs.   Family History:  The patient's family history includes Alzheimer's disease in his mother; Diabetes in his brother and mother; Heart disease in his father; Hyperlipidemia in his brother; Hypertension in his mother.   ROS:  Please see the history of present illness.      All other systems reviewed and negative.   PHYSICAL EXAM: VS:  BP  130/68 (BP Location: Right Arm, Patient Position: Sitting, Cuff Size: Large)   Pulse 61   Ht '6\' 3"'$  (1.905 m)   Wt 253 lb (114.8 kg)   BMI 31.62 kg/m  GENERAL:  Well appearing WM in NAD HEENT:  PERRL, EOMI, sclera are clear. Oropharynx is clear. NECK:  No jugular venous distention, carotid upstroke brisk and symmetric, no bruits, no thyromegaly or adenopathy Back is kyphotic.  HEART:  IRRR,  PMI not displaced or sustained,S1 and S2 within normal limits, no S3, no S4: no clicks, no rubs, 2/6 systolic systolic murmur at the apex ABD:  Soft, nontender. BS +, no masses or bruits. No hepatomegaly, no splenomegaly EXT:  2 + pulses throughout, no edema, no cyanosis no clubbing SKIN:  Warm and dry.  No rashes NEURO:  Alert and oriented x 3. Cranial nerves II through XII intact. PSYCH:  Cognitively intact    Laboratory data: Lab Results  Component Value Date   WBC 9.6 08/28/2016   HGB 10.4 (L) 08/28/2016   HCT 32.1 (L) 08/28/2016   PLT 170 08/28/2016   GLUCOSE 178 (H) 08/28/2016   ALT 14 (L) 08/27/2016   AST 16 08/27/2016   NA 141 08/28/2016   K 5.6 (H) 08/28/2016   CL 115 (H) 08/28/2016   CREATININE 2.63 (H) 08/28/2016   BUN 65 (H) 08/28/2016   CO2 21 (L) 08/28/2016   INR 1.2 06/26/2021   HGBA1C 6.6 (H) 01/14/2021   MICROALBUR 150 01/23/2021   Dated 10/19/17: cholesterol 145, triglycerides 125, HDL 38, LDL 82. A1c 7.5%. Hgb 13.1. creatinine 1.71. other chemistries and TSH normal.  EKG:  Today- AFib, HR 64. No acute ST changes. I have personally reviewed and interpreted this study.  Echo 12/24/14: Study Conclusions   - Left ventricle: The cavity size was mildly dilated. Wall   thickness was normal. Systolic function was normal. The estimated   ejection fraction was in the range of 60% to 65%. - Mitral valve: There was moderate regurgitation. - Left atrium: The atrium was severely dilated. - Right ventricle: The cavity size was moderately dilated. Wall   thickness was normal. -  Right atrium: The atrium was moderately dilated  Echo 05/31/20: IMPRESSIONS     1. Left ventricular ejection fraction, by estimation, is 60 to 65%. The  left ventricle has normal function. The left ventricle has no regional  wall motion abnormalities. There is mild left ventricular hypertrophy.  Left ventricular diastolic function  could not be  evaluated.   2. Right ventricular systolic function is normal. The right ventricular  size is normal. There is severely elevated pulmonary artery systolic  pressure. The estimated right ventricular systolic pressure is 01.7 mmHg.   3. Left atrial size was severely dilated.   4. Right atrial size was moderately dilated.   5. The mitral valve is abnormal. Moderate to severe mitral valve  regurgitation.   6. Eccentric TR jet. The tricuspid valve is abnormal. Tricuspid valve  regurgitation is moderate.   7. The aortic valve is tricuspid. Aortic valve regurgitation is trivial.   8. The inferior vena cava is dilated in size with <50% respiratory  variability, suggesting right atrial pressure of 15 mmHg.   Comparison(s): Changes from prior study are noted. 05/31/19 EF 60-65%.  Moderate-severe MR, RVSP 50 mmHg.   ASSESSMENT AND PLAN:  Atrial Fibrillation- chronic and permanent:  Rate is well controlled on metoprolol.  Coumadin is managed by PCP.  Mitral Regurgitation: moderate- severe. I personally reviewed Echo. 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 He has had AFib since 1970s. His LV function is good. He is not really symptomatic. The pulmonary HTN noted on Echo has been there at least 20 years.Will continue current therapy.  Hypertension:  BP under good  control per patient report Hyperlipidemia:  Continue statin. Reports lab done at [facility] within the last year.   Signed, Espen Bethel Martinique MD, [facility]    08/04/2021 3:48 PM

## 2021-08-04 ENCOUNTER — Encounter: Payer: Self-pay | Admitting: Cardiology

## 2021-08-04 ENCOUNTER — Ambulatory Visit (INDEPENDENT_AMBULATORY_CARE_PROVIDER_SITE_OTHER): Payer: Medicare Other | Admitting: Cardiology

## 2021-08-04 VITALS — BP 130/68 | HR 61 | Ht 75.0 in | Wt 253.0 lb

## 2021-08-04 DIAGNOSIS — I340 Nonrheumatic mitral (valve) insufficiency: Secondary | ICD-10-CM | POA: Diagnosis not present

## 2021-08-04 DIAGNOSIS — I482 Chronic atrial fibrillation, unspecified: Secondary | ICD-10-CM | POA: Diagnosis not present

## 2021-08-04 DIAGNOSIS — I10 Essential (primary) hypertension: Secondary | ICD-10-CM | POA: Diagnosis not present

## 2021-08-12 ENCOUNTER — Other Ambulatory Visit (HOSPITAL_BASED_OUTPATIENT_CLINIC_OR_DEPARTMENT_OTHER): Payer: Self-pay | Admitting: Family Medicine

## 2021-08-13 ENCOUNTER — Telehealth (HOSPITAL_BASED_OUTPATIENT_CLINIC_OR_DEPARTMENT_OTHER): Payer: Self-pay

## 2021-08-13 DIAGNOSIS — N1832 Chronic kidney disease, stage 3b: Secondary | ICD-10-CM

## 2021-08-14 ENCOUNTER — Other Ambulatory Visit (HOSPITAL_BASED_OUTPATIENT_CLINIC_OR_DEPARTMENT_OTHER): Payer: Self-pay | Admitting: Family Medicine

## 2021-08-14 DIAGNOSIS — N1832 Chronic kidney disease, stage 3b: Secondary | ICD-10-CM | POA: Diagnosis not present

## 2021-08-15 LAB — BASIC METABOLIC PANEL
BUN/Creatinine Ratio: 20 (ref 10–24)
BUN: 34 mg/dL — ABNORMAL HIGH (ref 8–27)
CO2: 25 mmol/L (ref 20–29)
Calcium: 9.1 mg/dL (ref 8.6–10.2)
Chloride: 102 mmol/L (ref 96–106)
Creatinine, Ser: 1.68 mg/dL — ABNORMAL HIGH (ref 0.76–1.27)
Glucose: 218 mg/dL — ABNORMAL HIGH (ref 70–99)
Potassium: 4.4 mmol/L (ref 3.5–5.2)
Sodium: 143 mmol/L (ref 134–144)
eGFR: 42 mL/min/{1.73_m2} — ABNORMAL LOW (ref >59)

## 2021-08-22 ENCOUNTER — Telehealth (HOSPITAL_BASED_OUTPATIENT_CLINIC_OR_DEPARTMENT_OTHER): Payer: Self-pay

## 2021-08-22 DIAGNOSIS — N400 Enlarged prostate without lower urinary tract symptoms: Secondary | ICD-10-CM

## 2021-08-22 MED ORDER — TADALAFIL 10 MG PO TABS: 10.0000 mg | ORAL_TABLET | Freq: Every day | ORAL | 1 refills | Status: DC

## 2021-08-25 NOTE — Progress Notes (Signed)
Pt was called and he is aware of his lab results.

## 2021-08-25 NOTE — Telephone Encounter (Signed)
Pt was called and he is aware of the new prescription that was sent I. He stated that he has picked it up already.  Thanks

## 2021-08-26 ENCOUNTER — Ambulatory Visit (HOSPITAL_BASED_OUTPATIENT_CLINIC_OR_DEPARTMENT_OTHER): Payer: Medicare Other | Admitting: Family Medicine

## 2021-09-01 ENCOUNTER — Other Ambulatory Visit (HOSPITAL_BASED_OUTPATIENT_CLINIC_OR_DEPARTMENT_OTHER): Payer: Self-pay | Admitting: Family Medicine

## 2021-09-01 ENCOUNTER — Encounter (HOSPITAL_BASED_OUTPATIENT_CLINIC_OR_DEPARTMENT_OTHER): Payer: Self-pay | Admitting: Family Medicine

## 2021-09-01 ENCOUNTER — Ambulatory Visit (INDEPENDENT_AMBULATORY_CARE_PROVIDER_SITE_OTHER): Payer: Medicare Other | Admitting: Family Medicine

## 2021-09-01 VITALS — BP 136/76 | HR 60 | Ht 75.0 in | Wt 249.0 lb

## 2021-09-01 DIAGNOSIS — N1832 Chronic kidney disease, stage 3b: Secondary | ICD-10-CM | POA: Diagnosis not present

## 2021-09-01 DIAGNOSIS — I872 Venous insufficiency (chronic) (peripheral): Secondary | ICD-10-CM

## 2021-09-01 DIAGNOSIS — Z7901 Long term (current) use of anticoagulants: Secondary | ICD-10-CM

## 2021-09-01 DIAGNOSIS — E1122 Type 2 diabetes mellitus with diabetic chronic kidney disease: Secondary | ICD-10-CM

## 2021-09-01 NOTE — Progress Notes (Signed)
    Procedures performed today:    None.  Independent interpretation of notes and tests performed by another provider:   None.  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 Has had recent worsening in bilateral lower extremities, left worse than right. He did reach out to his Dermatology office a few weeks ago when first noticing issue. Was recommend topical creams, did schedule office visit with them, but soonest they had was in August. Since initial worsening/call to Dermatology, he feels that left leg has worsened. Has had seepage, redness. Has not been using compression stocking on left lower extremity due to some slight swelling in the leg.  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  Has some weeping of left lower extremity. Suspected exacerbation of underlying stasis dermatitis.  Patient has been utilizing dermatology recommendations and does have appointment scheduled, unfortunately this appointment is 2 months away.  Recommend that patient contact dermatology office and discuss gradual worsening since his initial phone call with them and try to schedule sooner office evaluation.  Also advised that if there continues to be difficulty with getting in sooner, to let us know and we can reach out to dermatology office for look to have patient establish with new dermatologist if needed Cautioned on potential warning signs for which patient should return to office sooner or present to emergency department including new onset fever, chills, worsening lower extremity pain  Chronic anticoagulation Continues on warfarin for anticoagulation related to underlying atrial fibrillation Patient has had inconsistent follow-up  regarding this with recommendations for repeat INRs to monitor warfarin and has been subtherapeutic in the past. Currently he is taking warfarin 2 mg tablet once daily currently. Has 1 mg tablets at home as well, but not taking at the moment. We will check INR today and adjust warfarin accordingly May need to consider referral to Coumadin clinic pending progress with warfarin adjustment  DM2 (diabetes mellitus, type 2) ([place]) Most recent hemoglobin A1c at goal at 6.6%, patient continues with glipizide once daily, denies any issues with this medication.  No new issues of polyuria or polydipsia Recommend continue with glipizide at current dose Plan to recheck hemoglobin A1c in the near future  Return in about 6 weeks (around 10/13/2021) for DM, warfarin, HTN.   ___________________________________________ Troy [place]cala de [place], MD, ABFM, [facility] and [place]

## 2021-09-01 NOTE — Patient Instructions (Signed)
  Medication Instructions:  Your physician recommends that you continue on your current medications as directed. Please refer to the Current Medication list given to you today. --If you need a refill on any your medications before your next appointment, please call your pharmacy first. If no refills are authorized on file call the office.-- Lab Work: Your physician has recommended that you have lab work today: Yes If you have labs (blood work) drawn today and your tests are completely normal, you will receive your results via MyChart message OR a phone call from our staff.  Please ensure you check your voicemail in the event that you authorized detailed messages to be left on a delegated number. If you have any lab test that is abnormal or we need to change your treatment, we will call you to review the results.  Referrals/Procedures/Imaging: No  Follow-Up: Your next appointment:   Your physician recommends that you schedule a follow-up appointment in: 6 weeks with Dr. de [place].  You will receive a text message or e-mail with a link to a survey about your care and experience with us today! We would greatly appreciate your feedback!   Thanks for letting us be apart of your health journey!!  Primary Care and Sports Medicine   Dr. Raymond de Cuba   We encourage you to activate your patient portal called "MyChart".  Sign up information is provided on this After Visit Summary.  MyChart is used to connect with patients for Virtual Visits (Telemedicine).  Patients are able to view lab/test results, encounter notes, upcoming appointments, etc.  Non-urgent messages can be sent to your provider as well. To learn more about what you can do with MyChart, please visit --  [email or web address].    

## 2021-09-01 NOTE — Assessment & Plan Note (Signed)
Continues on warfarin for anticoagulation related to underlying atrial fibrillation Patient has had inconsistent follow-up regarding this with recommendations for repeat INRs to monitor warfarin and has been subtherapeutic in the past. Currently he is taking warfarin 2 mg tablet once daily currently. Has 1 mg tablets at home as well, but not taking at the moment. We will check INR today and adjust warfarin accordingly May need to consider referral to Coumadin clinic pending progress with warfarin adjustment

## 2021-09-01 NOTE — Assessment & Plan Note (Addendum)
Does follow with Dermatology. Has had recent worsening in bilateral lower extremities, left worse than right. He did reach out to his Dermatology office a few weeks ago when first noticing issue. Was recommend topical creams, did schedule office visit with them, but soonest they had was in August. Since initial worsening/call to Dermatology, he feels that left leg has worsened. Has had seepage, redness. Has not been using compression stocking on left lower extremity due to some slight swelling in the leg.  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  Has some weeping of left lower extremity. Suspected exacerbation of underlying stasis dermatitis.  Patient has been utilizing dermatology recommendations and does have appointment scheduled, unfortunately this appointment is 2 months away.  Recommend that patient contact dermatology office and discuss gradual worsening since his initial phone call with them and try to schedule sooner office evaluation.  Also advised that if there continues to be difficulty with getting in sooner, to let us know and we can reach out to dermatology office for look to have patient establish with new dermatologist if needed Cautioned on potential warning signs for which patient should return to office sooner or present to emergency department including new onset fever, chills, worsening lower extremity pain

## 2021-09-01 NOTE — Assessment & Plan Note (Signed)
Most recent hemoglobin A1c at goal at 6.6%, patient continues with glipizide once daily, denies any issues with this medication.  No new issues of polyuria or polydipsia Recommend continue with glipizide at current dose Plan to recheck hemoglobin A1c in the near future

## 2021-09-02 ENCOUNTER — Other Ambulatory Visit (HOSPITAL_BASED_OUTPATIENT_CLINIC_OR_DEPARTMENT_OTHER): Payer: Self-pay | Admitting: Family Medicine

## 2021-09-02 DIAGNOSIS — Z7901 Long term (current) use of anticoagulants: Secondary | ICD-10-CM

## 2021-09-02 LAB — PROTIME-INR
INR: 2.1 — ABNORMAL HIGH (ref 0.9–1.2)
Prothrombin Time: 20.9 s — ABNORMAL HIGH (ref 9.1–12.0)

## 2021-09-02 NOTE — Progress Notes (Signed)
LVM for pt to cb to sch lab draw

## 2021-09-04 ENCOUNTER — Other Ambulatory Visit (HOSPITAL_BASED_OUTPATIENT_CLINIC_OR_DEPARTMENT_OTHER): Payer: Self-pay | Admitting: Family Medicine

## 2021-09-05 DIAGNOSIS — I8312 Varicose veins of left lower extremity with inflammation: Secondary | ICD-10-CM | POA: Diagnosis not present

## 2021-09-05 DIAGNOSIS — I8311 Varicose veins of right lower extremity with inflammation: Secondary | ICD-10-CM | POA: Diagnosis not present

## 2021-09-10 ENCOUNTER — Other Ambulatory Visit (HOSPITAL_BASED_OUTPATIENT_CLINIC_OR_DEPARTMENT_OTHER): Payer: Self-pay | Admitting: Family Medicine

## 2021-09-12 DIAGNOSIS — I872 Venous insufficiency (chronic) (peripheral): Secondary | ICD-10-CM | POA: Diagnosis not present

## 2021-09-15 ENCOUNTER — Encounter (HOSPITAL_BASED_OUTPATIENT_CLINIC_OR_DEPARTMENT_OTHER): Payer: Medicare Other

## 2021-09-20 ENCOUNTER — Other Ambulatory Visit (HOSPITAL_BASED_OUTPATIENT_CLINIC_OR_DEPARTMENT_OTHER): Payer: Self-pay | Admitting: Family Medicine

## 2021-09-20 DIAGNOSIS — E785 Hyperlipidemia, unspecified: Secondary | ICD-10-CM

## 2021-09-22 ENCOUNTER — Ambulatory Visit (HOSPITAL_BASED_OUTPATIENT_CLINIC_OR_DEPARTMENT_OTHER): Payer: Medicare Other

## 2021-09-22 DIAGNOSIS — N1832 Chronic kidney disease, stage 3b: Secondary | ICD-10-CM | POA: Diagnosis not present

## 2021-09-22 DIAGNOSIS — Z7901 Long term (current) use of anticoagulants: Secondary | ICD-10-CM | POA: Diagnosis not present

## 2021-09-23 LAB — BASIC METABOLIC PANEL
BUN/Creatinine Ratio: 19 (ref 10–24)
BUN: 35 mg/dL — ABNORMAL HIGH (ref 8–27)
CO2: 24 mmol/L (ref 20–29)
Calcium: 8.9 mg/dL (ref 8.6–10.2)
Chloride: 104 mmol/L (ref 96–106)
Creatinine, Ser: 1.85 mg/dL — ABNORMAL HIGH (ref 0.76–1.27)
Glucose: 151 mg/dL — ABNORMAL HIGH (ref 70–99)
Potassium: 4.8 mmol/L (ref 3.5–5.2)
Sodium: 141 mmol/L (ref 134–144)
eGFR: 38 mL/min/{1.73_m2} — ABNORMAL LOW (ref >59)

## 2021-09-23 LAB — PROTIME-INR
INR: 2.3 — ABNORMAL HIGH (ref 0.9–1.2)
Prothrombin Time: 23.3 s — ABNORMAL HIGH (ref 9.1–12.0)

## 2021-09-29 ENCOUNTER — Other Ambulatory Visit (HOSPITAL_BASED_OUTPATIENT_CLINIC_OR_DEPARTMENT_OTHER): Payer: Self-pay | Admitting: Family Medicine

## 2021-09-29 DIAGNOSIS — Z7901 Long term (current) use of anticoagulants: Secondary | ICD-10-CM

## 2021-09-29 NOTE — Progress Notes (Addendum)
Called pt and let him know that the provider is wanting to- recheck warfarin in 1 month. Pt stated he will cb to schedule after he has looked at his calendar.

## 2021-10-01 DIAGNOSIS — I8311 Varicose veins of right lower extremity with inflammation: Secondary | ICD-10-CM | POA: Diagnosis not present

## 2021-10-01 DIAGNOSIS — I8312 Varicose veins of left lower extremity with inflammation: Secondary | ICD-10-CM | POA: Diagnosis not present

## 2021-10-02 ENCOUNTER — Encounter (HOSPITAL_BASED_OUTPATIENT_CLINIC_OR_DEPARTMENT_OTHER): Payer: Self-pay

## 2021-10-15 ENCOUNTER — Other Ambulatory Visit (HOSPITAL_BASED_OUTPATIENT_CLINIC_OR_DEPARTMENT_OTHER): Payer: Self-pay | Admitting: Family Medicine

## 2021-10-15 DIAGNOSIS — N400 Enlarged prostate without lower urinary tract symptoms: Secondary | ICD-10-CM

## 2021-10-20 ENCOUNTER — Other Ambulatory Visit (HOSPITAL_BASED_OUTPATIENT_CLINIC_OR_DEPARTMENT_OTHER): Payer: Self-pay | Admitting: Family Medicine

## 2021-10-20 DIAGNOSIS — Z7901 Long term (current) use of anticoagulants: Secondary | ICD-10-CM

## 2021-10-28 ENCOUNTER — Ambulatory Visit: Payer: Medicare Other | Admitting: Gastroenterology

## 2021-10-29 ENCOUNTER — Telehealth: Payer: Self-pay | Admitting: *Deleted

## 2021-10-29 NOTE — Telephone Encounter (Signed)
   Pre-operative Risk Assessment    Patient Name: [name]  DOB: 04/12/1945 MRN: [id number]      Request for Surgical Clearance    Procedure:   CERVICAL ESI  Date of Surgery:  Clearance 11/06/21                                 Surgeon:  DR. [facility] Surgeon's Group or Practice Name:  [facility] Phone number:  [phone] Fax number:  [phone]   Type of Clearance Requested:   - Medical  - Pharmacy:  Hold Warfarin (Coumadin) x 5 DAYS PRIOR   Type of Anesthesia:  Not Indicated   Additional requests/questions:    [name]   10/29/2021, 4:30 PM

## 2021-10-30 ENCOUNTER — Telehealth: Payer: Self-pay | Admitting: *Deleted

## 2021-10-30 NOTE — Telephone Encounter (Signed)
Patient with diagnosis of afib on warfarin for anticoagulation.    Procedure: cervical ESI Date of procedure: 11/06/21  CHA2DS2-VASc Score = 4  This indicates a 4.8% annual risk of stroke. The patient's score is based upon: CHF History: 0 HTN History: 1 Diabetes History: 1 Stroke History: 0 Vascular Disease History: 0 Age Score: 2 Gender Score: 0  CrCl 30m/min Platelet count 149K  Anticoagulation is being managed by PCP office. Per office protocol, patient can hold warfarin for 5 days prior to procedure. Patient will not need bridging with Lovenox (enoxaparin) around procedure.  **This guidance is not considered finalized until pre-operative APP has relayed final recommendations.**

## 2021-10-30 NOTE — Telephone Encounter (Signed)
Pt has been scheduled for a tele visit, 10/31/21 11:00.  Consent on file / medications reconciled.    Patient Consent for Virtual Visit        [name] has provided verbal consent on 10/30/2021 for a virtual visit (video or telephone).   CONSENT FOR VIRTUAL VISIT FOR:  [name]  By participating in this virtual visit I agree to the following:  I hereby voluntarily request, consent and authorize [facility] and its employed or contracted physicians, physician assistants, nurse practitioners or other licensed health care professionals (the Practitioner), to provide me with telemedicine health care services (the "Services") as deemed necessary by the treating Practitioner. I acknowledge and consent to receive the Services by the Practitioner via telemedicine. I understand that the telemedicine visit will involve communicating with the Practitioner through live audiovisual communication technology and the disclosure of certain medical information by electronic transmission. I acknowledge that I have been given the opportunity to request an in-person assessment or other available alternative prior to the telemedicine visit and am voluntarily participating in the telemedicine visit.  I understand that I have the right to withhold or withdraw my consent to the use of telemedicine in the course of my care at any time, without affecting my right to future care or treatment, and that the Practitioner or I may terminate the telemedicine visit at any time. I understand that I have the right to inspect all information obtained and/or recorded in the course of the telemedicine visit and may receive copies of available information for a reasonable fee.  I understand that some of the potential risks of receiving the Services via telemedicine include:  Delay or interruption in medical evaluation due to technological equipment failure or disruption; Information transmitted may not be sufficient  (e.g. poor resolution of images) to allow for appropriate medical decision making by the Practitioner; and/or  In rare instances, security protocols could fail, causing a breach of personal health information.  Furthermore, I acknowledge that it is my responsibility to provide information about my medical history, conditions and care that is complete and accurate to the best of my ability. I acknowledge that Practitioner's advice, recommendations, and/or decision may be based on factors not within their control, such as incomplete or inaccurate data provided by me or distortions of diagnostic images or specimens that may result from electronic transmissions. I understand that the practice of medicine is not an exact science and that Practitioner makes no warranties or guarantees regarding treatment outcomes. I acknowledge that a copy of this consent can be made available to me via my patient portal ([facility]), or I can request a printed copy by calling the office of [facility].    I understand that my insurance will be billed for this visit.   I have read or had this consent read to me. I understand the contents of this consent, which adequately explains the benefits and risks of the Services being provided via telemedicine.  I have been provided ample opportunity to ask questions regarding this consent and the Services and have had my questions answered to my satisfaction. I give my informed consent for the services to be provided through the use of telemedicine in my medical care

## 2021-10-30 NOTE — Telephone Encounter (Signed)
Pt has been scheduled for a tele visit, 10/31/21 11:00.

## 2021-10-30 NOTE — Telephone Encounter (Signed)
   Name: [name]  DOB: Nov 21, 1945  MRN: [id number]  Primary Cardiologist: Peter Martinique, MD   Preoperative team, please contact this patient and set up a phone call appointment for further preoperative risk assessment. Please obtain consent and complete medication review. Thank you for your help.  I confirm that guidance regarding antiplatelet and oral anticoagulation therapy has been completed and, if necessary, noted below.  Patient with diagnosis of afib on warfarin for anticoagulation.     Procedure: cervical ESI Date of procedure: 11/06/21   CHA2DS2-VASc Score = 4  This indicates a 4.8% annual risk of stroke. The patient's score is based upon: CHF History: 0 HTN History: 1 Diabetes History: 1 Stroke History: 0 Vascular Disease History: 0 Age Score: 2 Gender Score: 0   CrCl 25m/min Platelet count 149K   Anticoagulation is being managed by PCP office. Per office protocol, patient can hold warfarin for 5 days prior to procedure. Patient will not need bridging with Lovenox (enoxaparin) around procedure.   ELenna Sciara NP 10/30/2021, 9:38 AM [place]

## 2021-10-31 ENCOUNTER — Ambulatory Visit (INDEPENDENT_AMBULATORY_CARE_PROVIDER_SITE_OTHER): Payer: Medicare Other | Admitting: Nurse Practitioner

## 2021-10-31 DIAGNOSIS — Z01810 Encounter for preprocedural cardiovascular examination: Secondary | ICD-10-CM | POA: Diagnosis not present

## 2021-10-31 NOTE — Progress Notes (Signed)
Virtual Visit via Telephone Note   Because of [name]'s co-morbid illnesses, he is at least at moderate risk for complications without adequate follow up.  This format is felt to be most appropriate for this patient at this time.  The patient did not have access to video technology/had technical difficulties with video requiring transitioning to audio format only (telephone).  All issues noted in this document were discussed and addressed.  No physical exam could be performed with this format.  Please refer to the patient's chart for his consent to telehealth for [facility].  Evaluation Performed:  Preoperative cardiovascular risk assessment _____________   Date:  10/31/2021   Patient ID:  [name], DOB Jul 05, 1945, MRN [id number] Patient Location:  Home Provider location:   Office  Primary Care Provider:  [place], Blondell Reveal, MD Primary Cardiologist:  Peter Martinique, MD  Chief Complaint / Patient Profile   76 y.o. y/o male with a h/o permanent atrial fibrillation, moderate MR, pulmonary HTN, hypertension, hyperlipidemia, and type 2 diabetes who is pending cervical ESI on 11/06/2021 with Dr. Lenord Carbo of [place] and presents today for telephonic preoperative cardiovascular risk assessment.  Past Medical History    Past Medical History:  Diagnosis Date   Atrial fibrillation (HCC)    Chronic anticoagulation    Complication of anesthesia    ' hard time waking up" per patient    Diabetes mellitus    type 2   GERD (gastroesophageal reflux disease)    Hyperlipidemia    Hypertension    Mitral regurgitation    a. 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HCC)    chronic atrial fib   Pulmonary hypertension ([place])    Pulmonary hypertension ([place])    Past Surgical History:  Procedure Laterality Date   CARDIOVERSION     CHOLECYSTECTOMY N/A 03/01/2015   Procedure:  LAPAROSCOPIC CHOLECYSTECTOMY;  Surgeon: Ralene Ok, MD;  Location: [facility];  Service: General;  Laterality: N/A;   spur on heel      Allergies  Allergies  Allergen Reactions   Atorvastatin    Lisinopril     Other reaction(s): Hyperkalemia, Renal impairment    History of Present Illness    [name] is a 76 y.o. male who presents via audio/video conferencing for a telehealth visit today.  Pt was last seen in cardiology clinic on 08/04/2021 by Dr. Martinique.  At that time [name] was doing well.  The patient is now pending procedure as outlined above. Since his last visit, he has done well from a cardiac standpoint. He denies chest pain, palpitations, dyspnea, pnd, orthopnea, n, v, dizziness, syncope, edema, weight gain, or early satiety. All other systems reviewed and are otherwise negative except as noted above.   Home Medications    Prior to Admission medications   Medication Sig Start Date End Date Taking? Authorizing Provider  acetaminophen (TYLENOL) 500 MG tablet Take 1,000 mg by mouth as needed for mild pain.    [provider]  allopurinol (ZYLOPRIM) 100 MG tablet TAKE ONE TABLET BY MOUTH TWICE A DAY 09/10/21   [place], Blondell Reveal, MD  famotidine (PEPCID) 20 MG tablet Take 1 tablet (20 mg total) by mouth daily. 05/27/21   Daryel November, MD  furosemide (LASIX) 20 MG tablet Take 20 mg by mouth daily. 02/07/18   [provider]  glipiZIDE (GLUCOTROL XL)  2.5 MG 24 hr tablet Take 1 tablet (2.5 mg total) by mouth daily with breakfast. 05/07/21   [place], Blondell Reveal, MD  Glucose Blood (FREESTYLE LITE TEST VI)  12/08/12   [provider]  guaiFENesin (MUCINEX) 600 MG 12 hr tablet Take 2 tablets (1,200 mg total) by mouth 2 (two) times daily as needed. 04/11/21   Orma Render, NP  lidocaine (LIDODERM) 5 % Place 1 patch onto the skin as needed. Remove & Discard patch within 12 hours or as directed by MD    [provider]  metoprolol  tartrate (LOPRESSOR) 50 MG tablet TAKE ONE TABLET BY MOUTH TWICE A DAY 09/04/21   [place], Blondell Reveal, MD  omeprazole (PRILOSEC) 40 MG capsule Take by mouth daily. 12/09/17   [provider]  simvastatin (ZOCOR) 20 MG tablet TAKE ONE TABLET BY MOUTH AT BEDTIME 09/21/21   [place], Blondell Reveal, MD  tadalafil (CIALIS) 10 MG tablet TAKE 1 TABLET BY MOUTH DAILY 10/16/21   [place], Raymond J, MD  warfarin (COUMADIN) 1 MG tablet TAKE 1 TABLET ONCE A WEEK IN ADDITION TO '2MG'$  10/20/21   [place], Blondell Reveal, MD  warfarin (COUMADIN) 2 MG tablet TAKE ONE TABLET BY MOUTH EVERY EVENING 10/15/21   [place], Raymond J, MD    Physical Exam    Vital Signs:  [name] does not have vital signs available for review today.  Given telephonic nature of communication, physical exam is limited. AAOx3. NAD. Normal affect.  Speech and respirations are unlabored.  Accessory Clinical Findings    None  Assessment & Plan    1.  Preoperative Cardiovascular Risk Assessment:  Patient with diagnosis of afib on warfarin for anticoagulation.  According to the Revised Cardiac Risk Index (RCRI), his Perioperative Risk of Major Cardiac Event is (%): 0.4. His Functional Capacity in METs is: 5.62 according to the Duke Activity Status Index (DASI). Therefore, based on ACC/AHA guidelines, patient would be at acceptable risk for the planned procedure without further cardiovascular testing.   Procedure: cervical ESI Date of procedure: 11/06/21   CHA2DS2-VASc Score = 4  This indicates a 4.8% annual risk of stroke. The patient's score is based upon: CHF History: 0 HTN History: 1 Diabetes History: 1 Stroke History: 0 Vascular Disease History: 0 Age Score: 2 Gender Score: 0   CrCl 60m/min Platelet count 149K   Anticoagulation is being managed by PCP office. Per office protocol, patient can hold warfarin for 5 days prior to procedure. Patient will not need bridging with Lovenox (enoxaparin) around procedure.    A copy of  this note will be routed to requesting surgeon.  Time:   Today, I have spent 8 minutes with the patient with telehealth technology discussing medical history, symptoms, and management plan.     ELenna Sciara NP  10/31/2021, 11:11 AM

## 2021-11-01 ENCOUNTER — Other Ambulatory Visit (HOSPITAL_BASED_OUTPATIENT_CLINIC_OR_DEPARTMENT_OTHER): Payer: Self-pay | Admitting: Family Medicine

## 2021-11-06 DIAGNOSIS — M5412 Radiculopathy, cervical region: Secondary | ICD-10-CM | POA: Diagnosis not present

## 2021-11-07 ENCOUNTER — Other Ambulatory Visit: Payer: Self-pay | Admitting: Gastroenterology

## 2021-11-14 ENCOUNTER — Other Ambulatory Visit (HOSPITAL_BASED_OUTPATIENT_CLINIC_OR_DEPARTMENT_OTHER): Payer: Self-pay | Admitting: Family Medicine

## 2021-11-29 ENCOUNTER — Other Ambulatory Visit (HOSPITAL_BASED_OUTPATIENT_CLINIC_OR_DEPARTMENT_OTHER): Payer: Self-pay | Admitting: Family Medicine

## 2021-11-29 DIAGNOSIS — N400 Enlarged prostate without lower urinary tract symptoms: Secondary | ICD-10-CM

## 2021-12-02 ENCOUNTER — Encounter: Payer: Self-pay | Admitting: Gastroenterology

## 2021-12-02 ENCOUNTER — Ambulatory Visit (INDEPENDENT_AMBULATORY_CARE_PROVIDER_SITE_OTHER): Payer: Medicare Other | Admitting: Gastroenterology

## 2021-12-02 VITALS — BP 138/86 | HR 71 | Ht 75.0 in | Wt 237.0 lb

## 2021-12-02 DIAGNOSIS — K219 Gastro-esophageal reflux disease without esophagitis: Secondary | ICD-10-CM | POA: Diagnosis not present

## 2021-12-02 DIAGNOSIS — R140 Abdominal distension (gaseous): Secondary | ICD-10-CM | POA: Diagnosis not present

## 2021-12-02 DIAGNOSIS — Z1212 Encounter for screening for malignant neoplasm of rectum: Secondary | ICD-10-CM

## 2021-12-02 DIAGNOSIS — Z1211 Encounter for screening for malignant neoplasm of colon: Secondary | ICD-10-CM | POA: Diagnosis not present

## 2021-12-02 DIAGNOSIS — N1832 Chronic kidney disease, stage 3b: Secondary | ICD-10-CM | POA: Diagnosis not present

## 2021-12-02 NOTE — Patient Instructions (Signed)
If you are age 75 or older, your body mass index should be between 23-30. Your Body mass index is 29.62 kg/m. If this is out of the aforementioned range listed, please consider follow up with your Primary Care Provider.  If you are age 39 or younger, your body mass index should be between 19-25. Your Body mass index is 29.62 kg/m. If this is out of the aformentioned range listed, please consider follow up with your Primary Care Provider.   Please call to schedule Colonoscopy in January.  Continue Pepcid.  STOP Prilosec.  The [facility] providers would like to encourage you to use [facility] to communicate with providers for non-urgent requests or questions.  Due to long hold times on the telephone, sending your provider a message by [facility] may be a faster and more efficient way to get a response.  Please allow 48 business hours for a response.  Please remember that this is for non-urgent requests.   It was a pleasure to see you today!  Thank you for trusting me with your gastrointestinal care!

## 2021-12-02 NOTE — Progress Notes (Signed)
HPI : [name] is a very pleasant 76 year old male with atrial fibrillation, diabetes and pulmonary hypertension who I initially saw October 2022 for further evaluation of significant bloating and dyspepsia, with abdominal discomfort and nausea.  I recommended he try stopping his PPI, as there was not a clear indication he needs to be on it, he had chronic kidney disease, and PPI can sometimes cause bloating.  I recommended he try taking Pepcid as needed for any GERD symptoms.  We plan to check an H. pylori stool antigen, but he did did not submit a stool sample. He was also scheduled for a routine screening colonoscopy, but this was not completed. Today, the patient reports that his bloating and abdominal discomfort have resolved.  He denies significant GI symptoms currently.  He does not wake up from sleep with any GERD symptoms anymore.  No dysphagia.  He reports making changes in his lifestyle to include improving his diet and increasing his physical activity.  He has lost 22 pounds over the past few months because of this. He reports having regular bowel movements, no problems with constipation, diarrhea or blood in the stool. He states he is very busy currently, and does not want to commit to a colonoscopy, but thinks that January should be a good time for him. He recently received cardiology clearance to hold his Coumadin for 5 days prior to a recent cervical EPI   Past Medical History:  Diagnosis Date   Atrial fibrillation (HCC)    Chronic anticoagulation    Complication of anesthesia    ' hard time waking up" per patient    Diabetes mellitus    type 2   GERD (gastroesophageal reflux disease)    Hyperlipidemia    Hypertension    Mitral regurgitation    a. 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HCC)    chronic atrial fib   Pulmonary hypertension ([place])    Pulmonary hypertension ([place])      Past  Surgical History:  Procedure Laterality Date   CARDIOVERSION     CHOLECYSTECTOMY N/A 03/01/2015   Procedure: LAPAROSCOPIC CHOLECYSTECTOMY;  Surgeon: Ralene Ok, MD;  Location: [facility];  Service: General;  Laterality: N/A;   spur on heel     Family History  Problem Relation Age of Onset   Hypertension Mother    Alzheimer's disease Mother    Diabetes Mother    Heart disease Father        cabgx2, respiratory failure, pacemaker   Diabetes Brother    Hyperlipidemia Brother    Social History   Tobacco Use   Smoking status: Never   Smokeless tobacco: Never  Vaping Use   Vaping Use: Never used  Substance Use Topics   Alcohol use: Not Currently    Comment: margarita    Drug use: No   Current Outpatient Medications  Medication Sig Dispense Refill   acetaminophen (TYLENOL) 500 MG tablet Take 1,000 mg by mouth as needed for mild pain.     allopurinol (ZYLOPRIM) 100 MG tablet TAKE 1 TABLET BY MOUTH TWICE A DAY 120 tablet 0   famotidine (PEPCID) 20 MG tablet Take 1 tablet (20 mg total) by mouth daily. 90 tablet 0   furosemide (LASIX) 20 MG tablet Take 20 mg by mouth daily.     glipiZIDE (GLUCOTROL XL) 2.5 MG 24 hr tablet TAKE ONE TABLET BY MOUTH DAILY WITH BREAKFAST 90 tablet 1  Glucose Blood (FREESTYLE LITE TEST VI)      lidocaine (LIDODERM) 5 % Place 1 patch onto the skin as needed. Remove & Discard patch within 12 hours or as directed by MD     metoprolol tartrate (LOPRESSOR) 50 MG tablet TAKE ONE TABLET BY MOUTH TWICE A DAY 180 tablet 0   omeprazole (PRILOSEC) 40 MG capsule Take by mouth daily.  2   simvastatin (ZOCOR) 20 MG tablet TAKE ONE TABLET BY MOUTH AT BEDTIME 90 tablet 0   tadalafil (CIALIS) 10 MG tablet TAKE 1 TABLET BY MOUTH DAILY 30 tablet 1   warfarin (COUMADIN) 2 MG tablet TAKE ONE TABLET BY MOUTH EVERY EVENING 30 tablet 1   guaiFENesin (MUCINEX) 600 MG 12 hr tablet Take 2 tablets (1,200 mg total) by mouth 2 (two) times daily as needed. (Patient not taking: Reported  on 12/02/2021) 30 tablet 1   warfarin (COUMADIN) 1 MG tablet TAKE 1 TABLET ONCE A WEEK IN ADDITION TO '2MG'$  (Patient not taking: Reported on 12/02/2021) 5 tablet 1   No current facility-administered medications for this visit.   Allergies  Allergen Reactions   Atorvastatin    Lisinopril     Other reaction(s): Hyperkalemia, Renal impairment     Review of Systems: All systems reviewed and negative except where noted in HPI.    No results found.  Physical Exam: BP 138/86   Pulse 71   Ht '6\' 3"'$  (1.905 m)   Wt 237 lb (107.5 kg)   BMI 29.62 kg/m  Constitutional: Pleasant,well-developed, Caucasian male in no acute distress. HEENT: Normocephalic and atraumatic. Conjunctivae are normal. No scleral icterus. Neck supple.  Cardiovascular: Normal rate, regular rhythm.  Pulmonary/chest: Effort normal and breath sounds normal. No wheezing, rales or rhonchi. Abdominal: Soft, nondistended, nontender. Bowel sounds active throughout. There are no masses palpable. No hepatomegaly. Extremities: Bilateral lower extremities and compression stockings, changes of venous stasis/diabetic dermopathy present Neurological: Alert and oriented to person place and time. Skin: Skin is warm and dry. No rashes noted. Psychiatric: Normal mood and affect. Behavior is normal.  CBC    Component Value Date/Time   WBC 9.6 08/28/2016 0659   RBC 3.15 (L) 08/28/2016 0659   HGB 10.4 (L) 08/28/2016 0659   HCT 32.1 (L) 08/28/2016 0659   PLT 170 08/28/2016 0659   MCV 101.9 (H) 08/28/2016 0659   MCH 33.0 08/28/2016 0659   MCHC 32.4 08/28/2016 0659   RDW 14.6 08/28/2016 0659   LYMPHSABS 2.0 08/27/2016 1851   MONOABS 0.8 08/27/2016 1851   EOSABS 0.3 08/27/2016 1851   BASOSABS 0.0 08/27/2016 1851    CMP     Component Value Date/Time   NA 141 09/22/2021 1404   K 4.8 09/22/2021 1404   CL 104 09/22/2021 1404   CO2 24 09/22/2021 1404   GLUCOSE 151 (H) 09/22/2021 1404   GLUCOSE 178 (H) 08/28/2016 0905   BUN 35 (H)  09/22/2021 1404   CREATININE 1.85 (H) 09/22/2021 1404   CALCIUM 8.9 09/22/2021 1404   PROT 7.2 08/27/2016 1851   ALBUMIN 4.2 08/27/2016 1851   AST 16 08/27/2016 1851   ALT 14 (L) 08/27/2016 1851   ALKPHOS 49 08/27/2016 1851   BILITOT 0.6 08/27/2016 1851   GFRNONAA 23 (L) 08/28/2016 0905   GFRAA 27 (L) 08/28/2016 0905     ASSESSMENT AND PLAN: 76 year old male with atrial fibrillation, diabetes, previously seen for significant bloating and upper abdominal discomfort, now with no persistent GI symptoms.  He takes Pepcid as needed for  GERD symptoms.  He is due for a final screening colonoscopy.  His last colonoscopy was at the [place], [place] in 2012 and was normal per patient.  He would prefer to have his next colonoscopy closer to home  Bloating -Improved, possibly related to PPI effect  GERD - Improved, likely with improvements in diet and weight loss - Continue as needed Pepcid  Colon cancer screening - Colonoscopy, patient will contact her office in December to schedule for a January procedure - Hold coumadin 5 days (cleared by cardiology in August prior to cervical EPI), no need for repeat cardiology clearance  The details, risks (including bleeding, perforation, infection, missed lesions, medication reactions and possible hospitalization or surgery if complications occur), benefits, and alternatives to colonoscopy with possible biopsy and possible polypectomy were discussed with the patient and he/she consents to proceed.   [name] E. Candis Schatz, MD [facility]   CC: [place], Blondell Reveal, MD

## 2021-12-03 ENCOUNTER — Other Ambulatory Visit (HOSPITAL_BASED_OUTPATIENT_CLINIC_OR_DEPARTMENT_OTHER): Payer: Self-pay | Admitting: Family Medicine

## 2021-12-11 ENCOUNTER — Other Ambulatory Visit (HOSPITAL_BASED_OUTPATIENT_CLINIC_OR_DEPARTMENT_OTHER): Payer: Self-pay | Admitting: Family Medicine

## 2021-12-12 DIAGNOSIS — N2581 Secondary hyperparathyroidism of renal origin: Secondary | ICD-10-CM | POA: Diagnosis not present

## 2021-12-12 DIAGNOSIS — I129 Hypertensive chronic kidney disease with stage 1 through stage 4 chronic kidney disease, or unspecified chronic kidney disease: Secondary | ICD-10-CM | POA: Diagnosis not present

## 2021-12-12 DIAGNOSIS — N1832 Chronic kidney disease, stage 3b: Secondary | ICD-10-CM | POA: Diagnosis not present

## 2021-12-12 DIAGNOSIS — D631 Anemia in chronic kidney disease: Secondary | ICD-10-CM | POA: Diagnosis not present

## 2021-12-17 ENCOUNTER — Other Ambulatory Visit (HOSPITAL_BASED_OUTPATIENT_CLINIC_OR_DEPARTMENT_OTHER): Payer: Self-pay | Admitting: Family Medicine

## 2021-12-17 DIAGNOSIS — E785 Hyperlipidemia, unspecified: Secondary | ICD-10-CM

## 2021-12-23 NOTE — Telephone Encounter (Signed)
Error

## 2021-12-24 ENCOUNTER — Ambulatory Visit (HOSPITAL_BASED_OUTPATIENT_CLINIC_OR_DEPARTMENT_OTHER): Payer: Medicare Other

## 2021-12-24 DIAGNOSIS — M793 Panniculitis, unspecified: Secondary | ICD-10-CM | POA: Diagnosis not present

## 2021-12-24 DIAGNOSIS — I872 Venous insufficiency (chronic) (peripheral): Secondary | ICD-10-CM | POA: Diagnosis not present

## 2021-12-24 DIAGNOSIS — I890 Lymphedema, not elsewhere classified: Secondary | ICD-10-CM | POA: Diagnosis not present

## 2021-12-24 DIAGNOSIS — L309 Dermatitis, unspecified: Secondary | ICD-10-CM | POA: Diagnosis not present

## 2021-12-25 DIAGNOSIS — Z7901 Long term (current) use of anticoagulants: Secondary | ICD-10-CM | POA: Diagnosis not present

## 2021-12-26 LAB — PROTIME-INR
INR: 2.3 — ABNORMAL HIGH (ref 0.9–1.2)
Prothrombin Time: 23.7 s — ABNORMAL HIGH (ref 9.1–12.0)

## 2021-12-26 NOTE — Telephone Encounter (Signed)
Error

## 2021-12-29 ENCOUNTER — Other Ambulatory Visit (HOSPITAL_BASED_OUTPATIENT_CLINIC_OR_DEPARTMENT_OTHER): Payer: Self-pay | Admitting: Family Medicine

## 2021-12-29 ENCOUNTER — Ambulatory Visit (INDEPENDENT_AMBULATORY_CARE_PROVIDER_SITE_OTHER): Payer: Medicare Other | Admitting: Family Medicine

## 2021-12-29 ENCOUNTER — Encounter (HOSPITAL_BASED_OUTPATIENT_CLINIC_OR_DEPARTMENT_OTHER): Payer: Self-pay | Admitting: Family Medicine

## 2021-12-29 DIAGNOSIS — S7012XA Contusion of left thigh, initial encounter: Secondary | ICD-10-CM | POA: Diagnosis not present

## 2021-12-29 DIAGNOSIS — S7010XA Contusion of unspecified thigh, initial encounter: Secondary | ICD-10-CM | POA: Insufficient documentation

## 2021-12-29 DIAGNOSIS — Z7901 Long term (current) use of anticoagulants: Secondary | ICD-10-CM

## 2021-12-29 NOTE — Progress Notes (Signed)
    Procedures performed today:    None.  Independent interpretation of notes and tests performed by another provider:   None.  Brief History, Exam, Impression, and Recommendations:    BP 110/70   Pulse 67   Ht '6\' 3"'$  (1.905 m)   Wt 231 lb (104.8 kg)   SpO2 99%   BMI 28.87 kg/m   Hematoma of thigh Patient presents for evaluation of bruise along medial aspect of distal left thigh.  He reports that he first noticed the bruise about 5 days ago.  He indicates that he woke up at night to use the bathroom and when getting into bed he felt some soreness in substernal light and noticed a bruise at that time.  He does not recall any trauma to the area, only thing that he can recall is having a big stretch while in bed prior to getting out of bed.  He has not noticed any significant knee joint swelling or restriction in range of motion.  He has had some increased soreness/pain with using stairs, also has some tenderness over the area.  He denies any other bleeding issues such as blood in the urine, blood in stool, bleeding gums, nosebleeds.  Denies any significant dietary changes.  Has continue with same dose of warfarin.  He did have his INR checked recently and it was found to be therapeutic at 2.3.  He does feel that it looks to be improving as the area was more solid discoloration/bruising and now appears to be more scattered with some clearing throughout On exam, patient is in no acute distress, vital signs stable.  Over distal left thigh, primarily along medial aspect, he does have what appears to be a resolving ecchymosis that is somewhat scattered in nature, it does not have a large confluence.  He does have mild to moderate tenderness to palpation throughout this area.  He does have normal range of motion in that knee for flexion and extension without significant knee joint swelling appreciated.  Patient does have Unna boots on bilaterally, thus impairing assessment of lower extremity  edema. Discussed with patient that it is possible that current bleeding was atraumatic and again reviewed risks and benefits related to anticoagulation therapy with underlying atrial fibrillation.  Fortunately, his INR was found to be therapeutic and not notably elevated, thus increasing risk of bleeding, however there is still increased risk even when INR is within therapeutic range.  Discussed options to be considered including holding warfarin for a few days due to bleeding/hematoma.  Also discussed that given hematoma appears to be improving/resolving, this would suggest that bleeding has since stopped and given that INR was therapeutic, would be reasonable to consider continuing with warfarin at this time without any change and continuing to monitor closely for improvement/resolution.  Given improvement over the last couple days and therapeutic INR level, would favor continuing with warfarin at this time and monitoring area on thigh.  Advised on concerning findings or changes for which patient should return to the office for further evaluation If any worsening occurs or any further questions or concerns, recommend returning to the office for further evaluation We will plan for close follow-up in about 3 to 4 weeks to reassess and follow-up on chronic medical issues  Return in about 4 weeks (around 01/26/2022) for left thigh, anticoagulation, DM.   ___________________________________________ Solace Manwarren de [place], MD, ABFM, [facility] and [place]

## 2021-12-29 NOTE — Patient Instructions (Signed)
  Medication Instructions:  Your physician recommends that you continue on your current medications as directed. Please refer to the Current Medication list given to you today. --If you need a refill on any your medications before your next appointment, please call your pharmacy first. If no refills are authorized on file call the office.-- Lab Work: Your physician has recommended that you have lab work today: No If you have labs (blood work) drawn today and your tests are completely normal, you will receive your results via MyChart message OR a phone call from our staff.  Please ensure you check your voicemail in the event that you authorized detailed messages to be left on a delegated number. If you have any lab test that is abnormal or we need to change your treatment, we will call you to review the results.  Referrals/Procedures/Imaging: No  Follow-Up: Your next appointment:   Your physician recommends that you schedule a follow-up appointment in: 3-4 weeks with Dr. de Cuba.  You will receive a text message or e-mail with a link to a survey about your care and experience with us today! We would greatly appreciate your feedback!   Thanks for letting us be apart of your health journey!!  Primary Care and Sports Medicine   Dr. Raymond de Cuba   We encourage you to activate your patient portal called "MyChart".  Sign up information is provided on this After Visit Summary.  MyChart is used to connect with patients for Virtual Visits (Telemedicine).  Patients are able to view lab/test results, encounter notes, upcoming appointments, etc.  Non-urgent messages can be sent to your provider as well. To learn more about what you can do with MyChart, please visit --  [email or web address].    

## 2021-12-29 NOTE — Assessment & Plan Note (Signed)
Patient presents for evaluation of bruise along medial aspect of distal left thigh.  He reports that he first noticed the bruise about 5 days ago.  He indicates that he woke up at night to use the bathroom and when getting into bed he felt some soreness in substernal light and noticed a bruise at that time.  He does not recall any trauma to the area, only thing that he can recall is having a big stretch while in bed prior to getting out of bed.  He has not noticed any significant knee joint swelling or restriction in range of motion.  He has had some increased soreness/pain with using stairs, also has some tenderness over the area.  He denies any other bleeding issues such as blood in the urine, blood in stool, bleeding gums, nosebleeds.  Denies any significant dietary changes.  Has continue with same dose of warfarin.  He did have his INR checked recently and it was found to be therapeutic at 2.3.  He does feel that it looks to be improving as the area was more solid discoloration/bruising and now appears to be more scattered with some clearing throughout On exam, patient is in no acute distress, vital signs stable.  Over distal left thigh, primarily along medial aspect, he does have what appears to be a resolving ecchymosis that is somewhat scattered in nature, it does not have a large confluence.  He does have mild to moderate tenderness to palpation throughout this area.  He does have normal range of motion in that knee for flexion and extension without significant knee joint swelling appreciated.  Patient does have Unna boots on bilaterally, thus impairing assessment of lower extremity edema. Discussed with patient that it is possible that current bleeding was atraumatic and again reviewed risks and benefits related to anticoagulation therapy with underlying atrial fibrillation.  Fortunately, his INR was found to be therapeutic and not notably elevated, thus increasing risk of bleeding, however there is still  increased risk even when INR is within therapeutic range.  Discussed options to be considered including holding warfarin for a few days due to bleeding/hematoma.  Also discussed that given hematoma appears to be improving/resolving, this would suggest that bleeding has since stopped and given that INR was therapeutic, would be reasonable to consider continuing with warfarin at this time without any change and continuing to monitor closely for improvement/resolution.  Given improvement over the last couple days and therapeutic INR level, would favor continuing with warfarin at this time and monitoring area on thigh.  Advised on concerning findings or changes for which patient should return to the office for further evaluation If any worsening occurs or any further questions or concerns, recommend returning to the office for further evaluation We will plan for close follow-up in about 3 to 4 weeks to reassess and follow-up on chronic medical issues

## 2021-12-31 DIAGNOSIS — L97529 Non-pressure chronic ulcer of other part of left foot with unspecified severity: Secondary | ICD-10-CM | POA: Diagnosis not present

## 2021-12-31 DIAGNOSIS — C4321 Malignant melanoma of right ear and external auricular canal: Secondary | ICD-10-CM | POA: Diagnosis not present

## 2021-12-31 DIAGNOSIS — D489 Neoplasm of uncertain behavior, unspecified: Secondary | ICD-10-CM | POA: Diagnosis not present

## 2021-12-31 DIAGNOSIS — I872 Venous insufficiency (chronic) (peripheral): Secondary | ICD-10-CM | POA: Diagnosis not present

## 2022-01-09 ENCOUNTER — Other Ambulatory Visit (HOSPITAL_BASED_OUTPATIENT_CLINIC_OR_DEPARTMENT_OTHER): Payer: Self-pay | Admitting: Family Medicine

## 2022-01-21 DIAGNOSIS — C4321 Malignant melanoma of right ear and external auricular canal: Secondary | ICD-10-CM | POA: Diagnosis not present

## 2022-01-21 DIAGNOSIS — C434 Malignant melanoma of scalp and neck: Secondary | ICD-10-CM | POA: Diagnosis not present

## 2022-01-21 DIAGNOSIS — Z85820 Personal history of malignant melanoma of skin: Secondary | ICD-10-CM | POA: Diagnosis not present

## 2022-01-21 DIAGNOSIS — S01301A Unspecified open wound of right ear, initial encounter: Secondary | ICD-10-CM | POA: Diagnosis not present

## 2022-01-21 DIAGNOSIS — Z08 Encounter for follow-up examination after completed treatment for malignant neoplasm: Secondary | ICD-10-CM | POA: Diagnosis not present

## 2022-01-23 ENCOUNTER — Other Ambulatory Visit: Payer: Self-pay | Admitting: Gastroenterology

## 2022-01-23 ENCOUNTER — Other Ambulatory Visit (HOSPITAL_BASED_OUTPATIENT_CLINIC_OR_DEPARTMENT_OTHER): Payer: Self-pay | Admitting: Family Medicine

## 2022-01-23 DIAGNOSIS — N400 Enlarged prostate without lower urinary tract symptoms: Secondary | ICD-10-CM

## 2022-01-27 ENCOUNTER — Ambulatory Visit (HOSPITAL_BASED_OUTPATIENT_CLINIC_OR_DEPARTMENT_OTHER): Payer: Medicare Other | Admitting: Family Medicine

## 2022-02-09 DIAGNOSIS — M5412 Radiculopathy, cervical region: Secondary | ICD-10-CM | POA: Diagnosis not present

## 2022-02-12 ENCOUNTER — Encounter (HOSPITAL_BASED_OUTPATIENT_CLINIC_OR_DEPARTMENT_OTHER): Payer: Self-pay

## 2022-02-25 DIAGNOSIS — M793 Panniculitis, unspecified: Secondary | ICD-10-CM | POA: Diagnosis not present

## 2022-02-25 DIAGNOSIS — I872 Venous insufficiency (chronic) (peripheral): Secondary | ICD-10-CM | POA: Diagnosis not present

## 2022-03-04 ENCOUNTER — Telehealth: Payer: Self-pay | Admitting: Cardiology

## 2022-03-04 NOTE — Telephone Encounter (Signed)
Called patient, advised of note from MD. ?Patient verbalized understanding.  ? ?Thankful for call back.  ? ?

## 2022-03-04 NOTE — Telephone Encounter (Signed)
Patient stated he would like to get orders for a vascular blood flow test on his legs.

## 2022-03-04 NOTE — Telephone Encounter (Signed)
Called patient, advised that he has been seeing dermatology, as he has wounds that will not heal- he requested that this be mentioned to [facility], they are trying to find the cause of this, and he is requesting a leg scan. I advised I would route to MD to review.   Notes from dermatology in care everywhere.

## 2022-03-08 ENCOUNTER — Other Ambulatory Visit (HOSPITAL_BASED_OUTPATIENT_CLINIC_OR_DEPARTMENT_OTHER): Payer: Self-pay | Admitting: Family Medicine

## 2022-03-09 ENCOUNTER — Other Ambulatory Visit (HOSPITAL_BASED_OUTPATIENT_CLINIC_OR_DEPARTMENT_OTHER): Payer: Self-pay | Admitting: Family Medicine

## 2022-03-09 DIAGNOSIS — Z7901 Long term (current) use of anticoagulants: Secondary | ICD-10-CM

## 2022-03-17 ENCOUNTER — Other Ambulatory Visit (HOSPITAL_BASED_OUTPATIENT_CLINIC_OR_DEPARTMENT_OTHER): Payer: Self-pay

## 2022-03-17 DIAGNOSIS — E785 Hyperlipidemia, unspecified: Secondary | ICD-10-CM

## 2022-03-17 MED ORDER — SIMVASTATIN 20 MG PO TABS: 20.0000 mg | ORAL_TABLET | Freq: Every day | ORAL | 0 refills | Status: DC

## 2022-03-18 ENCOUNTER — Ambulatory Visit (INDEPENDENT_AMBULATORY_CARE_PROVIDER_SITE_OTHER): Payer: Medicare Other | Admitting: Family Medicine

## 2022-03-18 ENCOUNTER — Ambulatory Visit (INDEPENDENT_AMBULATORY_CARE_PROVIDER_SITE_OTHER): Payer: Medicare Other

## 2022-03-18 ENCOUNTER — Encounter (HOSPITAL_BASED_OUTPATIENT_CLINIC_OR_DEPARTMENT_OTHER): Payer: Self-pay | Admitting: Family Medicine

## 2022-03-18 VITALS — BP 115/65 | HR 75 | Ht 75.0 in | Wt 230.6 lb

## 2022-03-18 DIAGNOSIS — R051 Acute cough: Secondary | ICD-10-CM

## 2022-03-18 DIAGNOSIS — R0600 Dyspnea, unspecified: Secondary | ICD-10-CM | POA: Diagnosis not present

## 2022-03-18 DIAGNOSIS — R059 Cough, unspecified: Secondary | ICD-10-CM | POA: Insufficient documentation

## 2022-03-18 MED ORDER — BENZONATATE 100 MG PO CAPS: 100.0000 mg | ORAL_CAPSULE | Freq: Three times a day (TID) | ORAL | 0 refills | Status: DC | PRN

## 2022-03-18 NOTE — Assessment & Plan Note (Signed)
Patient reports that over 1 week ago, he initially began to feel sick and has had chest congestion, sinus congestion, cough, sweats.  He thinks he may have had a low-grade fever at times.  He was initially beginning to feel better a few days ago, however did have worsening as of this week has gone on.  He has had occasional shortness of breath.  Has tried some over-the-counter medications to help with symptoms. On exam, patient is in no acute distress, vital signs stable.  Cardiovascular exam with regular rate and rhythm, lungs with occasional crackles, otherwise clear to auscultation Suspect symptoms initially related to viral etiology, however with subsequent improvement and then worsening, it is possible that patient has had secondary bacterial infection occur.  Given ongoing cough, pulmonary findings, we will proceed with initial chest x-ray today.  Plan to proceed with antibiotic therapy after chest x-ray completed in order to be able to determine most appropriate antibiotic therapy to proceed with. Can continue with OTC medications to help with symptoms.  Prescription for Ladona Ridgel sent to pharmacy on file for assistance with controlling cough.

## 2022-03-18 NOTE — Progress Notes (Signed)
    Procedures performed today:    None.  Independent interpretation of notes and tests performed by another provider:   None.  Brief History, Exam, Impression, and Recommendations:    BP 115/65 (BP Location: Left Arm, Patient Position: Sitting, Cuff Size: Large)   Pulse 75   Ht '6\' 3"'$  (1.905 m)   Wt 230 lb 9.6 oz (104.6 kg)   SpO2 97%   BMI 28.82 kg/m   Cough Patient reports that over 1 week ago, he initially began to feel sick and has had chest congestion, sinus congestion, cough, sweats.  He thinks he may have had a low-grade fever at times.  He was initially beginning to feel better a few days ago, however did have worsening as of this week has gone on.  He has had occasional shortness of breath.  Has tried some over-the-counter medications to help with symptoms. On exam, patient is in no acute distress, vital signs stable.  Cardiovascular exam with regular rate and rhythm, lungs with occasional crackles, otherwise clear to auscultation Suspect symptoms initially related to viral etiology, however with subsequent improvement and then worsening, it is possible that patient has had secondary bacterial infection occur.  Given ongoing cough, pulmonary findings, we will proceed with initial chest x-ray today.  Plan to proceed with antibiotic therapy after chest x-ray completed in order to be able to determine most appropriate antibiotic therapy to proceed with. Can continue with OTC medications to help with symptoms.  Prescription for Ladona Ridgel sent to pharmacy on file for assistance with controlling cough.   ___________________________________________ Odessa Morren de [place], MD, ABFM, [facility] and [place]

## 2022-03-20 DIAGNOSIS — M793 Panniculitis, unspecified: Secondary | ICD-10-CM | POA: Diagnosis not present

## 2022-03-20 DIAGNOSIS — M310 Hypersensitivity angiitis: Secondary | ICD-10-CM | POA: Diagnosis not present

## 2022-03-20 DIAGNOSIS — L309 Dermatitis, unspecified: Secondary | ICD-10-CM | POA: Diagnosis not present

## 2022-03-20 DIAGNOSIS — L958 Other vasculitis limited to the skin: Secondary | ICD-10-CM | POA: Diagnosis not present

## 2022-03-20 DIAGNOSIS — I872 Venous insufficiency (chronic) (peripheral): Secondary | ICD-10-CM | POA: Diagnosis not present

## 2022-03-23 ENCOUNTER — Telehealth (HOSPITAL_BASED_OUTPATIENT_CLINIC_OR_DEPARTMENT_OTHER): Payer: Self-pay

## 2022-03-23 ENCOUNTER — Other Ambulatory Visit (HOSPITAL_BASED_OUTPATIENT_CLINIC_OR_DEPARTMENT_OTHER): Payer: Self-pay | Admitting: Family Medicine

## 2022-03-23 NOTE — Telephone Encounter (Signed)
Pt called and requested to see if he can get valacyclovir for fever blisters if possible ? He stated his wife recommended because his other prescription is not working at all.

## 2022-03-24 ENCOUNTER — Encounter (HOSPITAL_BASED_OUTPATIENT_CLINIC_OR_DEPARTMENT_OTHER): Payer: Self-pay | Admitting: Family Medicine

## 2022-03-24 ENCOUNTER — Ambulatory Visit (INDEPENDENT_AMBULATORY_CARE_PROVIDER_SITE_OTHER): Payer: Medicare Other | Admitting: Family Medicine

## 2022-03-24 VITALS — BP 139/62 | HR 70 | Ht 75.0 in | Wt 230.0 lb

## 2022-03-24 DIAGNOSIS — K130 Diseases of lips: Secondary | ICD-10-CM | POA: Diagnosis not present

## 2022-03-24 DIAGNOSIS — E1122 Type 2 diabetes mellitus with diabetic chronic kidney disease: Secondary | ICD-10-CM

## 2022-03-24 DIAGNOSIS — N1832 Chronic kidney disease, stage 3b: Secondary | ICD-10-CM

## 2022-03-24 DIAGNOSIS — Z7901 Long term (current) use of anticoagulants: Secondary | ICD-10-CM

## 2022-03-24 MED ORDER — VALACYCLOVIR HCL 1 G PO TABS: 2000.0000 mg | ORAL_TABLET | Freq: Two times a day (BID) | ORAL | 0 refills | Status: AC

## 2022-03-24 NOTE — Progress Notes (Signed)
    Procedures performed today:    None.  Independent interpretation of notes and tests performed by another provider:   None.  Brief History, Exam, Impression, and Recommendations:    BP 139/62 (BP Location: Left Arm, Patient Position: Sitting, Cuff Size: Large)   Pulse 70   Ht '6\' 3"'$  (1.905 m)   Wt 230 lb (104.3 kg)   SpO2 100%   BMI 28.75 kg/m   Lesion of lip Patient reports that moment under a week ago, he began to have lesions on both lips.  This was coinciding with recent respiratory illness.  He has had similar issues in the past, however has not had lesions quite to this degree of involvement with his lips.  Typically he has used Abreva in the past and this has adequately treated lesions when they have arisen.  No specific pain, itching, numbness or tingling On exam, patient is in no acute distress, vital signs stable, over both lips, patient has various lesions with hemorrhagic appearing scabs in place.  No oozing or discharge present this time.  No significant surrounding erythema. Patient has had intermittent issues with similar lesions in the past, however slightly more involved at this time.  Typically these will respond to Abreva, however has not been the case with current lesions.  Given appearance of lesions, we will treat initially with Valacyclovir for potential herpes simplex involvement.  Prescription has been sent to pharmacy on file  DM2 (diabetes mellitus, type 2) ([place]) Patient is due for recheck of hemoglobin A1c, we will check this today.  He does continue with glipizide for management of blood sugars, denies any issues at this time He is also due for nephropathy screening, discussed today.  He does not feel that he would be able to provide urine sample today.  Will provide patient with lab requisition as he will be obtaining labs at local [place] prior to upcoming appointment with nephrology.  We will await results of these labs  Chronic anticoagulation Has not  had recent check of INR, we will proceed with this today for monitoring.  Patient intermittently was lost to follow-up in regards to obtaining INR for monitoring of anticoagulation with warfarin.  Will proceed with check today and manage warfarin/future INR checks based on results.  If this remains within therapeutic range, likely plan for 1 month follow-up to monitor INR  Return in about 3 months (around 06/23/2022) for DM.   ___________________________________________ Troy Balzarini de [place], MD, ABFM, [facility] and [place]

## 2022-03-24 NOTE — Assessment & Plan Note (Signed)
Patient reports that moment under a week ago, he began to have lesions on both lips.  This was coinciding with recent respiratory illness.  He has had similar issues in the past, however has not had lesions quite to this degree of involvement with his lips.  Typically he has used Abreva in the past and this has adequately treated lesions when they have arisen.  No specific pain, itching, numbness or tingling On exam, patient is in no acute distress, vital signs stable, over both lips, patient has various lesions with hemorrhagic appearing scabs in place.  No oozing or discharge present this time.  No significant surrounding erythema. Patient has had intermittent issues with similar lesions in the past, however slightly more involved at this time.  Typically these will respond to Abreva, however has not been the case with current lesions.  Given appearance of lesions, we will treat initially with Valacyclovir for potential herpes simplex involvement.  Prescription has been sent to pharmacy on file

## 2022-03-24 NOTE — Patient Instructions (Signed)

## 2022-03-24 NOTE — Assessment & Plan Note (Signed)
Has not had recent check of INR, we will proceed with this today for monitoring.  Patient intermittently was lost to follow-up in regards to obtaining INR for monitoring of anticoagulation with warfarin.  Will proceed with check today and manage warfarin/future INR checks based on results.  If this remains within therapeutic range, likely plan for 1 month follow-up to monitor INR

## 2022-03-24 NOTE — Assessment & Plan Note (Signed)
Patient is due for recheck of hemoglobin A1c, we will check this today.  He does continue with glipizide for management of blood sugars, denies any issues at this time He is also due for nephropathy screening, discussed today.  He does not feel that he would be able to provide urine sample today.  Will provide patient with lab requisition as he will be obtaining labs at local [place] prior to upcoming appointment with nephrology.  We will await results of these labs

## 2022-03-25 ENCOUNTER — Other Ambulatory Visit (HOSPITAL_BASED_OUTPATIENT_CLINIC_OR_DEPARTMENT_OTHER): Payer: Self-pay | Admitting: Family Medicine

## 2022-03-25 DIAGNOSIS — Z7901 Long term (current) use of anticoagulants: Secondary | ICD-10-CM

## 2022-03-25 LAB — PROTIME-INR
INR: 3.4 — ABNORMAL HIGH (ref 0.9–1.2)
Prothrombin Time: 33.8 s — ABNORMAL HIGH (ref 9.1–12.0)

## 2022-03-25 LAB — HEMOGLOBIN A1C
Est. average glucose Bld gHb Est-mCnc: 146 mg/dL
Hgb A1c MFr Bld: 6.7 % — ABNORMAL HIGH (ref 4.8–5.6)

## 2022-03-26 DIAGNOSIS — R590 Localized enlarged lymph nodes: Secondary | ICD-10-CM | POA: Diagnosis not present

## 2022-03-26 DIAGNOSIS — L97529 Non-pressure chronic ulcer of other part of left foot with unspecified severity: Secondary | ICD-10-CM | POA: Diagnosis not present

## 2022-03-26 DIAGNOSIS — I872 Venous insufficiency (chronic) (peripheral): Secondary | ICD-10-CM | POA: Diagnosis not present

## 2022-03-26 DIAGNOSIS — R600 Localized edema: Secondary | ICD-10-CM | POA: Diagnosis not present

## 2022-03-26 DIAGNOSIS — E11621 Type 2 diabetes mellitus with foot ulcer: Secondary | ICD-10-CM | POA: Diagnosis not present

## 2022-03-26 NOTE — Progress Notes (Signed)
Spoke to pt on 1/11 @ 8:13 pt will cb to schedule-recheck INR in 1 week on nurse sch. After he looks at his calendar.

## 2022-03-27 DIAGNOSIS — R233 Spontaneous ecchymoses: Secondary | ICD-10-CM | POA: Diagnosis not present

## 2022-03-27 DIAGNOSIS — M310 Hypersensitivity angiitis: Secondary | ICD-10-CM | POA: Diagnosis not present

## 2022-03-27 DIAGNOSIS — R791 Abnormal coagulation profile: Secondary | ICD-10-CM | POA: Diagnosis not present

## 2022-03-27 DIAGNOSIS — B009 Herpesviral infection, unspecified: Secondary | ICD-10-CM | POA: Diagnosis not present

## 2022-03-28 ENCOUNTER — Other Ambulatory Visit (HOSPITAL_BASED_OUTPATIENT_CLINIC_OR_DEPARTMENT_OTHER): Payer: Self-pay | Admitting: Family Medicine

## 2022-03-28 DIAGNOSIS — N400 Enlarged prostate without lower urinary tract symptoms: Secondary | ICD-10-CM

## 2022-03-28 DIAGNOSIS — R233 Spontaneous ecchymoses: Secondary | ICD-10-CM | POA: Insufficient documentation

## 2022-03-31 ENCOUNTER — Ambulatory Visit (HOSPITAL_BASED_OUTPATIENT_CLINIC_OR_DEPARTMENT_OTHER): Payer: Medicare Other

## 2022-03-31 DIAGNOSIS — Z7901 Long term (current) use of anticoagulants: Secondary | ICD-10-CM | POA: Diagnosis not present

## 2022-04-01 LAB — PROTIME-INR
INR: 2.7 — ABNORMAL HIGH (ref 0.9–1.2)
Prothrombin Time: 27.7 s — ABNORMAL HIGH (ref 9.1–12.0)

## 2022-04-08 DIAGNOSIS — N1832 Chronic kidney disease, stage 3b: Secondary | ICD-10-CM | POA: Diagnosis not present

## 2022-04-15 DIAGNOSIS — L03115 Cellulitis of right lower limb: Secondary | ICD-10-CM | POA: Diagnosis not present

## 2022-04-15 DIAGNOSIS — N2581 Secondary hyperparathyroidism of renal origin: Secondary | ICD-10-CM | POA: Diagnosis not present

## 2022-04-15 DIAGNOSIS — N1832 Chronic kidney disease, stage 3b: Secondary | ICD-10-CM | POA: Diagnosis not present

## 2022-04-15 DIAGNOSIS — D631 Anemia in chronic kidney disease: Secondary | ICD-10-CM | POA: Diagnosis not present

## 2022-04-15 DIAGNOSIS — I129 Hypertensive chronic kidney disease with stage 1 through stage 4 chronic kidney disease, or unspecified chronic kidney disease: Secondary | ICD-10-CM | POA: Diagnosis not present

## 2022-04-16 ENCOUNTER — Ambulatory Visit (HOSPITAL_BASED_OUTPATIENT_CLINIC_OR_DEPARTMENT_OTHER): Payer: Medicare Other | Admitting: Family Medicine

## 2022-04-16 ENCOUNTER — Ambulatory Visit (INDEPENDENT_AMBULATORY_CARE_PROVIDER_SITE_OTHER): Payer: Medicare Other | Admitting: Family Medicine

## 2022-04-16 ENCOUNTER — Encounter (HOSPITAL_BASED_OUTPATIENT_CLINIC_OR_DEPARTMENT_OTHER): Payer: Self-pay | Admitting: Family Medicine

## 2022-04-16 VITALS — BP 114/74 | HR 65 | Ht 75.0 in | Wt 213.0 lb

## 2022-04-16 DIAGNOSIS — I4821 Permanent atrial fibrillation: Secondary | ICD-10-CM | POA: Diagnosis not present

## 2022-04-16 DIAGNOSIS — G63 Polyneuropathy in diseases classified elsewhere: Secondary | ICD-10-CM

## 2022-04-16 DIAGNOSIS — E349 Endocrine disorder, unspecified: Secondary | ICD-10-CM

## 2022-04-16 MED ORDER — DULOXETINE HCL 20 MG PO CPEP: 20.0000 mg | ORAL_CAPSULE | Freq: Every day | ORAL | 1 refills | Status: DC

## 2022-04-16 MED ORDER — GABAPENTIN 100 MG PO CAPS: 100.0000 mg | ORAL_CAPSULE | Freq: Three times a day (TID) | ORAL | 0 refills | Status: DC

## 2022-04-16 NOTE — Assessment & Plan Note (Addendum)
History of A-fib and mitral valve insufficiency.  On Coumadin therapy.  Last PT/INR on March 31, 2022 was 2.7.  Wife reports that 3 weeks ago he had "blood rash "and she stopped the Coumadin at that time.  He has been off of Coumadin for 3 weeks.  Will check PT/INR today, encouraged wife to restart Coumadin 1 mg this evening awaiting lab results.  Discussed risk of stroke while being off of Coumadin with atrial fibrillation.

## 2022-04-16 NOTE — Assessment & Plan Note (Addendum)
Seen by nephrology yesterday, has severe lower extremity pain and neuropathy.  Specialist recommended starting Cymbalta and gabapentin as this would not affect renal function.  He is seeing a vascular specialist soon for blockages in his lower extremities.  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  Legs were wrapped with Coban today.  Was started on an antibiotic yesterday by specialist.  Cymbalta 20 mg daily, gabapentin 100 mg 3 times daily started.  Instructed to start slow on the gabapentin as this can make people drowsy.  Wife reports she gave him 1 dose of 100 mg today he seems to have tolerated that well.

## 2022-04-16 NOTE — Progress Notes (Signed)
Established Patient Office Visit  Subjective   Patient ID: [name], male    DOB: May 31, 1945  Age: 77 y.o. MRN: [id number]  Chief Complaint  Patient presents with   Leg Pain    Pt here for leg pain, pt stated he need to switch to two medications, shaking from the pain and he get nervous     HPI Has blockages in both legs that is causing pain. 10/10 lower legs. Saw kidney doctor yesterday who suggestive he get started on gabapentin and Cymbalta due to kidney function. Started on doxycycline 100 mg BID per kidney doctor for open areas on lower extremities.  Pain is intermittent. Lower extremities with erythema, swelling, and open sores. This has been going on for past month. Wrapped with bandages today.   Seeing vascular in March for consultation.    3 weeks ago, "had a blood rash" stopped Coumadin at that time. INR on 03/31/22: 2.7. will recheck INR today.   Rash on bottom from diarrhea. Putting cream from legs on buttocks. Recommend Zinc oxide.    Review of Systems  Constitutional:  Positive for chills. Negative for fever.  Gastrointestinal:  Positive for diarrhea. Negative for nausea and vomiting.       Low appetite for last 2 weeks.       Objective:     BP 114/74 (BP Location: Right Arm, Patient Position: Sitting, Cuff Size: Large)   Pulse 65   Ht '6\' 3"'$  (1.905 m)   Wt 213 lb (96.6 kg)   SpO2 98%   BMI 26.62 kg/m    Physical Exam Vitals and nursing note reviewed.  Constitutional:      General: He is not in acute distress.    Appearance: He is not ill-appearing.  Cardiovascular:     Rate and Rhythm: Normal rate. Rhythm irregular.  Pulmonary:     Effort: Pulmonary effort is normal.     Breath sounds: Normal breath sounds.  Skin:    General: Skin is warm and dry.     Comments: Bilateral lower extremities wrapped with coban due to open sores.   Did not assess rash on buttocks today per patient preference.   Neurological:     Mental Status: He is alert.       No results found for any visits on 04/16/22.    The ASCVD Risk score (Arnett DK, et al., 2019) failed to calculate for the following reasons:   Cannot find a previous total cholesterol lab    Assessment & Plan:   Problem List Items Addressed This Visit     Permanent atrial fibrillation (HCC) (Chronic)    History of A-fib and mitral valve insufficiency.  On Coumadin therapy.  Last PT/INR on March 31, 2022 was 2.7.  Wife reports that 3 weeks ago he had "blood rash "and she stopped the Coumadin at that time.  He has been off of Coumadin for 3 weeks.  Will check PT/INR today, encouraged wife to restart Coumadin 1 mg this evening awaiting lab results.  Discussed risk of stroke while being off of Coumadin with atrial fibrillation.      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  Specialist recommended starting Cymbalta and gabapentin as this would not affect renal function.  He is seeing a vascular specialist soon for blockages in his lower extremities.  He appears to have venous  stasis dermatitis in both lower extremities with some erythema swelling and open areas.  Legs were wrapped with Coban today.  Was started on an antibiotic yesterday by specialist.  Cymbalta 20 mg daily, gabapentin 100 mg 3 times daily started.  Instructed to start slow on the gabapentin as this can make people drowsy.  Wife reports she gave him 1 dose of 100 mg today he seems to have tolerated that well.      Relevant Medications   DULoxetine (CYMBALTA) 20 MG capsule   gabapentin (NEURONTIN) 100 MG capsule  Did not assess rash on buttocks due to privacy issues. Recommend zinc oxide and keeping area as clean and dry as possible.   Will follow-up on PT/INR once results are available.   Return for has 05/21/22  with PCP scheduled. Chalmers Guest, FNP

## 2022-04-17 LAB — PROTIME-INR
INR: 1.1 (ref 0.9–1.2)
Prothrombin Time: 11.9 s (ref 9.1–12.0)

## 2022-04-20 ENCOUNTER — Other Ambulatory Visit (HOSPITAL_BASED_OUTPATIENT_CLINIC_OR_DEPARTMENT_OTHER): Payer: Self-pay | Admitting: *Deleted

## 2022-04-20 DIAGNOSIS — Z7901 Long term (current) use of anticoagulants: Secondary | ICD-10-CM | POA: Diagnosis not present

## 2022-04-21 ENCOUNTER — Other Ambulatory Visit: Payer: Self-pay | Admitting: Family Medicine

## 2022-04-21 ENCOUNTER — Telehealth (HOSPITAL_BASED_OUTPATIENT_CLINIC_OR_DEPARTMENT_OTHER): Payer: Self-pay

## 2022-04-21 DIAGNOSIS — I4891 Unspecified atrial fibrillation: Secondary | ICD-10-CM

## 2022-04-21 LAB — PROTIME-INR
INR: 1.4 — ABNORMAL HIGH (ref 0.9–1.2)
Prothrombin Time: 14.7 s — ABNORMAL HIGH (ref 9.1–12.0)

## 2022-04-21 NOTE — Telephone Encounter (Signed)
Pt was called in rgeards to his INR levels being low. Troy Ades, NP spoke with his wife in regards to the new dose he is suppose to be taking. She also want him to follow-up back up this Friday on 04/24/22 for a nurse visit to re-check INR. It is very important that he follow orders due to being at risk of a stroke.

## 2022-04-23 DIAGNOSIS — M310 Hypersensitivity angiitis: Secondary | ICD-10-CM | POA: Diagnosis not present

## 2022-04-24 ENCOUNTER — Telehealth (HOSPITAL_BASED_OUTPATIENT_CLINIC_OR_DEPARTMENT_OTHER): Payer: Self-pay | Admitting: Family Medicine

## 2022-04-24 ENCOUNTER — Ambulatory Visit (HOSPITAL_BASED_OUTPATIENT_CLINIC_OR_DEPARTMENT_OTHER): Payer: Medicare Other

## 2022-04-24 DIAGNOSIS — I4811 Longstanding persistent atrial fibrillation: Secondary | ICD-10-CM

## 2022-04-24 DIAGNOSIS — I4891 Unspecified atrial fibrillation: Secondary | ICD-10-CM

## 2022-04-24 NOTE — Telephone Encounter (Signed)
Pt advised No to the FLU Vaccine

## 2022-04-25 LAB — PROTIME-INR
INR: 4.9 — ABNORMAL HIGH (ref 0.9–1.2)
Prothrombin Time: 47.8 s — ABNORMAL HIGH (ref 9.1–12.0)

## 2022-04-26 ENCOUNTER — Other Ambulatory Visit: Payer: Self-pay | Admitting: Family Medicine

## 2022-04-26 ENCOUNTER — Encounter: Payer: Self-pay | Admitting: Family Medicine

## 2022-04-26 DIAGNOSIS — I4811 Longstanding persistent atrial fibrillation: Secondary | ICD-10-CM

## 2022-04-27 DIAGNOSIS — E878 Other disorders of electrolyte and fluid balance, not elsewhere classified: Secondary | ICD-10-CM | POA: Diagnosis not present

## 2022-04-27 DIAGNOSIS — R791 Abnormal coagulation profile: Secondary | ICD-10-CM | POA: Diagnosis not present

## 2022-04-27 DIAGNOSIS — M793 Panniculitis, unspecified: Secondary | ICD-10-CM | POA: Diagnosis not present

## 2022-04-27 DIAGNOSIS — L89313 Pressure ulcer of right buttock, stage 3: Secondary | ICD-10-CM | POA: Diagnosis not present

## 2022-04-27 DIAGNOSIS — I482 Chronic atrial fibrillation, unspecified: Secondary | ICD-10-CM | POA: Diagnosis not present

## 2022-04-27 DIAGNOSIS — D649 Anemia, unspecified: Secondary | ICD-10-CM | POA: Diagnosis not present

## 2022-04-27 DIAGNOSIS — E785 Hyperlipidemia, unspecified: Secondary | ICD-10-CM | POA: Diagnosis not present

## 2022-04-27 DIAGNOSIS — I4819 Other persistent atrial fibrillation: Secondary | ICD-10-CM | POA: Diagnosis not present

## 2022-04-27 DIAGNOSIS — L97929 Non-pressure chronic ulcer of unspecified part of left lower leg with unspecified severity: Secondary | ICD-10-CM | POA: Diagnosis not present

## 2022-04-27 DIAGNOSIS — D72829 Elevated white blood cell count, unspecified: Secondary | ICD-10-CM | POA: Diagnosis not present

## 2022-04-27 DIAGNOSIS — Z794 Long term (current) use of insulin: Secondary | ICD-10-CM | POA: Diagnosis not present

## 2022-04-27 DIAGNOSIS — D689 Coagulation defect, unspecified: Secondary | ICD-10-CM | POA: Diagnosis not present

## 2022-04-27 DIAGNOSIS — L03115 Cellulitis of right lower limb: Secondary | ICD-10-CM | POA: Diagnosis not present

## 2022-04-27 DIAGNOSIS — M109 Gout, unspecified: Secondary | ICD-10-CM | POA: Diagnosis not present

## 2022-04-27 DIAGNOSIS — A419 Sepsis, unspecified organism: Secondary | ICD-10-CM | POA: Diagnosis not present

## 2022-04-27 DIAGNOSIS — E119 Type 2 diabetes mellitus without complications: Secondary | ICD-10-CM | POA: Diagnosis not present

## 2022-04-27 DIAGNOSIS — Z20822 Contact with and (suspected) exposure to covid-19: Secondary | ICD-10-CM | POA: Diagnosis not present

## 2022-04-27 DIAGNOSIS — L89323 Pressure ulcer of left buttock, stage 3: Secondary | ICD-10-CM | POA: Diagnosis not present

## 2022-04-27 DIAGNOSIS — L03116 Cellulitis of left lower limb: Secondary | ICD-10-CM | POA: Diagnosis not present

## 2022-04-27 DIAGNOSIS — L97911 Non-pressure chronic ulcer of unspecified part of right lower leg limited to breakdown of skin: Secondary | ICD-10-CM | POA: Diagnosis not present

## 2022-04-27 DIAGNOSIS — E440 Moderate protein-calorie malnutrition: Secondary | ICD-10-CM | POA: Diagnosis not present

## 2022-04-27 DIAGNOSIS — M310 Hypersensitivity angiitis: Secondary | ICD-10-CM | POA: Diagnosis not present

## 2022-04-27 DIAGNOSIS — I872 Venous insufficiency (chronic) (peripheral): Secondary | ICD-10-CM | POA: Diagnosis not present

## 2022-04-27 DIAGNOSIS — E11621 Type 2 diabetes mellitus with foot ulcer: Secondary | ICD-10-CM | POA: Diagnosis not present

## 2022-04-27 DIAGNOSIS — E875 Hyperkalemia: Secondary | ICD-10-CM | POA: Diagnosis not present

## 2022-04-27 DIAGNOSIS — F419 Anxiety disorder, unspecified: Secondary | ICD-10-CM | POA: Diagnosis not present

## 2022-04-27 DIAGNOSIS — R300 Dysuria: Secondary | ICD-10-CM | POA: Diagnosis not present

## 2022-04-27 DIAGNOSIS — I129 Hypertensive chronic kidney disease with stage 1 through stage 4 chronic kidney disease, or unspecified chronic kidney disease: Secondary | ICD-10-CM | POA: Diagnosis not present

## 2022-04-27 DIAGNOSIS — Z7984 Long term (current) use of oral hypoglycemic drugs: Secondary | ICD-10-CM | POA: Diagnosis not present

## 2022-04-27 DIAGNOSIS — I272 Pulmonary hypertension, unspecified: Secondary | ICD-10-CM | POA: Diagnosis not present

## 2022-04-27 DIAGNOSIS — E1122 Type 2 diabetes mellitus with diabetic chronic kidney disease: Secondary | ICD-10-CM | POA: Diagnosis not present

## 2022-04-27 DIAGNOSIS — L97921 Non-pressure chronic ulcer of unspecified part of left lower leg limited to breakdown of skin: Secondary | ICD-10-CM | POA: Diagnosis not present

## 2022-04-27 DIAGNOSIS — K5900 Constipation, unspecified: Secondary | ICD-10-CM | POA: Diagnosis not present

## 2022-04-27 DIAGNOSIS — L959 Vasculitis limited to the skin, unspecified: Secondary | ICD-10-CM | POA: Diagnosis not present

## 2022-04-27 DIAGNOSIS — Z7901 Long term (current) use of anticoagulants: Secondary | ICD-10-CM | POA: Diagnosis not present

## 2022-04-27 DIAGNOSIS — R58 Hemorrhage, not elsewhere classified: Secondary | ICD-10-CM | POA: Diagnosis not present

## 2022-04-27 DIAGNOSIS — I776 Arteritis, unspecified: Secondary | ICD-10-CM | POA: Diagnosis not present

## 2022-04-27 DIAGNOSIS — I87309 Chronic venous hypertension (idiopathic) without complications of unspecified lower extremity: Secondary | ICD-10-CM | POA: Diagnosis not present

## 2022-04-27 DIAGNOSIS — N189 Chronic kidney disease, unspecified: Secondary | ICD-10-CM | POA: Diagnosis not present

## 2022-04-27 DIAGNOSIS — E43 Unspecified severe protein-calorie malnutrition: Secondary | ICD-10-CM | POA: Diagnosis not present

## 2022-04-27 DIAGNOSIS — R432 Parageusia: Secondary | ICD-10-CM | POA: Diagnosis not present

## 2022-04-27 DIAGNOSIS — I4891 Unspecified atrial fibrillation: Secondary | ICD-10-CM | POA: Diagnosis not present

## 2022-04-28 DIAGNOSIS — N189 Chronic kidney disease, unspecified: Secondary | ICD-10-CM | POA: Diagnosis not present

## 2022-04-28 DIAGNOSIS — R300 Dysuria: Secondary | ICD-10-CM | POA: Diagnosis not present

## 2022-04-28 DIAGNOSIS — E1122 Type 2 diabetes mellitus with diabetic chronic kidney disease: Secondary | ICD-10-CM | POA: Diagnosis not present

## 2022-04-28 DIAGNOSIS — I4891 Unspecified atrial fibrillation: Secondary | ICD-10-CM | POA: Diagnosis not present

## 2022-04-28 DIAGNOSIS — L03115 Cellulitis of right lower limb: Secondary | ICD-10-CM | POA: Diagnosis not present

## 2022-04-28 DIAGNOSIS — I776 Arteritis, unspecified: Secondary | ICD-10-CM | POA: Diagnosis not present

## 2022-04-28 DIAGNOSIS — L959 Vasculitis limited to the skin, unspecified: Secondary | ICD-10-CM | POA: Diagnosis not present

## 2022-04-28 DIAGNOSIS — F419 Anxiety disorder, unspecified: Secondary | ICD-10-CM | POA: Diagnosis not present

## 2022-04-28 DIAGNOSIS — Z7901 Long term (current) use of anticoagulants: Secondary | ICD-10-CM | POA: Diagnosis not present

## 2022-04-28 DIAGNOSIS — L03116 Cellulitis of left lower limb: Secondary | ICD-10-CM | POA: Diagnosis not present

## 2022-04-28 DIAGNOSIS — L97921 Non-pressure chronic ulcer of unspecified part of left lower leg limited to breakdown of skin: Secondary | ICD-10-CM | POA: Diagnosis not present

## 2022-04-28 DIAGNOSIS — L97911 Non-pressure chronic ulcer of unspecified part of right lower leg limited to breakdown of skin: Secondary | ICD-10-CM | POA: Diagnosis not present

## 2022-04-28 DIAGNOSIS — E785 Hyperlipidemia, unspecified: Secondary | ICD-10-CM | POA: Diagnosis not present

## 2022-04-28 DIAGNOSIS — E440 Moderate protein-calorie malnutrition: Secondary | ICD-10-CM | POA: Diagnosis not present

## 2022-04-28 DIAGNOSIS — M109 Gout, unspecified: Secondary | ICD-10-CM | POA: Diagnosis not present

## 2022-04-28 DIAGNOSIS — I872 Venous insufficiency (chronic) (peripheral): Secondary | ICD-10-CM | POA: Diagnosis not present

## 2022-04-28 DIAGNOSIS — I482 Chronic atrial fibrillation, unspecified: Secondary | ICD-10-CM | POA: Diagnosis not present

## 2022-04-28 DIAGNOSIS — E119 Type 2 diabetes mellitus without complications: Secondary | ICD-10-CM | POA: Diagnosis not present

## 2022-04-28 DIAGNOSIS — Z7984 Long term (current) use of oral hypoglycemic drugs: Secondary | ICD-10-CM | POA: Diagnosis not present

## 2022-04-28 DIAGNOSIS — M310 Hypersensitivity angiitis: Secondary | ICD-10-CM | POA: Diagnosis not present

## 2022-04-29 DIAGNOSIS — M109 Gout, unspecified: Secondary | ICD-10-CM | POA: Diagnosis not present

## 2022-04-29 DIAGNOSIS — E785 Hyperlipidemia, unspecified: Secondary | ICD-10-CM | POA: Diagnosis not present

## 2022-04-29 DIAGNOSIS — I482 Chronic atrial fibrillation, unspecified: Secondary | ICD-10-CM | POA: Diagnosis not present

## 2022-04-29 DIAGNOSIS — E43 Unspecified severe protein-calorie malnutrition: Secondary | ICD-10-CM | POA: Diagnosis not present

## 2022-04-29 DIAGNOSIS — N189 Chronic kidney disease, unspecified: Secondary | ICD-10-CM | POA: Diagnosis not present

## 2022-04-29 DIAGNOSIS — L03116 Cellulitis of left lower limb: Secondary | ICD-10-CM | POA: Diagnosis not present

## 2022-04-29 DIAGNOSIS — R300 Dysuria: Secondary | ICD-10-CM | POA: Diagnosis not present

## 2022-04-29 DIAGNOSIS — E1122 Type 2 diabetes mellitus with diabetic chronic kidney disease: Secondary | ICD-10-CM | POA: Diagnosis not present

## 2022-04-29 DIAGNOSIS — L03115 Cellulitis of right lower limb: Secondary | ICD-10-CM | POA: Diagnosis not present

## 2022-04-29 DIAGNOSIS — F419 Anxiety disorder, unspecified: Secondary | ICD-10-CM | POA: Diagnosis not present

## 2022-04-29 DIAGNOSIS — E875 Hyperkalemia: Secondary | ICD-10-CM | POA: Diagnosis not present

## 2022-04-29 DIAGNOSIS — Z7984 Long term (current) use of oral hypoglycemic drugs: Secondary | ICD-10-CM | POA: Diagnosis not present

## 2022-04-30 DIAGNOSIS — E878 Other disorders of electrolyte and fluid balance, not elsewhere classified: Secondary | ICD-10-CM | POA: Diagnosis not present

## 2022-04-30 DIAGNOSIS — I4891 Unspecified atrial fibrillation: Secondary | ICD-10-CM | POA: Diagnosis not present

## 2022-05-01 ENCOUNTER — Telehealth (HOSPITAL_BASED_OUTPATIENT_CLINIC_OR_DEPARTMENT_OTHER): Payer: Self-pay | Admitting: Family Medicine

## 2022-05-01 ENCOUNTER — Ambulatory Visit (HOSPITAL_BASED_OUTPATIENT_CLINIC_OR_DEPARTMENT_OTHER): Payer: Medicare Other

## 2022-05-01 DIAGNOSIS — I4811 Longstanding persistent atrial fibrillation: Secondary | ICD-10-CM | POA: Diagnosis not present

## 2022-05-01 NOTE — Telephone Encounter (Signed)
Patient has been in [facility], they are starting the pt on Eliquis once his INR numbers are good

## 2022-05-02 ENCOUNTER — Other Ambulatory Visit (HOSPITAL_BASED_OUTPATIENT_CLINIC_OR_DEPARTMENT_OTHER): Payer: Self-pay | Admitting: Family Medicine

## 2022-05-02 LAB — PROTIME-INR
INR: 3.4 — ABNORMAL HIGH (ref 0.9–1.2)
Prothrombin Time: 33.6 s — ABNORMAL HIGH (ref 9.1–12.0)

## 2022-05-03 ENCOUNTER — Emergency Department (HOSPITAL_COMMUNITY): Payer: Medicare Other

## 2022-05-03 ENCOUNTER — Encounter (HOSPITAL_COMMUNITY): Payer: Self-pay

## 2022-05-03 ENCOUNTER — Inpatient Hospital Stay (HOSPITAL_COMMUNITY)
Admission: EM | Admit: 2022-05-03 | Discharge: 2022-05-10 | DRG: 378 | Disposition: A | Discharge status: Home Health | Payer: Medicare Other | Attending: Internal Medicine | Admitting: Internal Medicine

## 2022-05-03 DIAGNOSIS — L03115 Cellulitis of right lower limb: Secondary | ICD-10-CM | POA: Diagnosis present

## 2022-05-03 DIAGNOSIS — D62 Acute posthemorrhagic anemia: Secondary | ICD-10-CM | POA: Diagnosis present

## 2022-05-03 DIAGNOSIS — K219 Gastro-esophageal reflux disease without esophagitis: Secondary | ICD-10-CM | POA: Diagnosis present

## 2022-05-03 DIAGNOSIS — Z83438 Family history of other disorder of lipoprotein metabolism and other lipidemia: Secondary | ICD-10-CM

## 2022-05-03 DIAGNOSIS — K254 Chronic or unspecified gastric ulcer with hemorrhage: Secondary | ICD-10-CM | POA: Diagnosis present

## 2022-05-03 DIAGNOSIS — Z888 Allergy status to other drugs, medicaments and biological substances status: Secondary | ICD-10-CM

## 2022-05-03 DIAGNOSIS — K5641 Fecal impaction: Secondary | ICD-10-CM

## 2022-05-03 DIAGNOSIS — I951 Orthostatic hypotension: Secondary | ICD-10-CM | POA: Diagnosis not present

## 2022-05-03 DIAGNOSIS — Z8249 Family history of ischemic heart disease and other diseases of the circulatory system: Secondary | ICD-10-CM

## 2022-05-03 DIAGNOSIS — Z7952 Long term (current) use of systemic steroids: Secondary | ICD-10-CM

## 2022-05-03 DIAGNOSIS — I491 Atrial premature depolarization: Secondary | ICD-10-CM | POA: Diagnosis not present

## 2022-05-03 DIAGNOSIS — Z7901 Long term (current) use of anticoagulants: Secondary | ICD-10-CM | POA: Diagnosis not present

## 2022-05-03 DIAGNOSIS — R55 Syncope and collapse: Secondary | ICD-10-CM | POA: Diagnosis not present

## 2022-05-03 DIAGNOSIS — K269 Duodenal ulcer, unspecified as acute or chronic, without hemorrhage or perforation: Secondary | ICD-10-CM

## 2022-05-03 DIAGNOSIS — N400 Enlarged prostate without lower urinary tract symptoms: Secondary | ICD-10-CM | POA: Diagnosis present

## 2022-05-03 DIAGNOSIS — K264 Chronic or unspecified duodenal ulcer with hemorrhage: Principal | ICD-10-CM | POA: Diagnosis present

## 2022-05-03 DIAGNOSIS — E1122 Type 2 diabetes mellitus with diabetic chronic kidney disease: Secondary | ICD-10-CM | POA: Diagnosis present

## 2022-05-03 DIAGNOSIS — R791 Abnormal coagulation profile: Secondary | ICD-10-CM | POA: Diagnosis not present

## 2022-05-03 DIAGNOSIS — I10 Essential (primary) hypertension: Secondary | ICD-10-CM | POA: Diagnosis present

## 2022-05-03 DIAGNOSIS — I4819 Other persistent atrial fibrillation: Secondary | ICD-10-CM | POA: Diagnosis not present

## 2022-05-03 DIAGNOSIS — K317 Polyp of stomach and duodenum: Secondary | ICD-10-CM | POA: Diagnosis not present

## 2022-05-03 DIAGNOSIS — K922 Gastrointestinal hemorrhage, unspecified: Secondary | ICD-10-CM

## 2022-05-03 DIAGNOSIS — Z820 Family history of epilepsy and other diseases of the nervous system: Secondary | ICD-10-CM

## 2022-05-03 DIAGNOSIS — I878 Other specified disorders of veins: Secondary | ICD-10-CM | POA: Diagnosis present

## 2022-05-03 DIAGNOSIS — R531 Weakness: Secondary | ICD-10-CM | POA: Diagnosis not present

## 2022-05-03 DIAGNOSIS — I4821 Permanent atrial fibrillation: Secondary | ICD-10-CM | POA: Diagnosis present

## 2022-05-03 DIAGNOSIS — L03116 Cellulitis of left lower limb: Secondary | ICD-10-CM | POA: Diagnosis present

## 2022-05-03 DIAGNOSIS — I872 Venous insufficiency (chronic) (peripheral): Secondary | ICD-10-CM | POA: Diagnosis present

## 2022-05-03 DIAGNOSIS — I340 Nonrheumatic mitral (valve) insufficiency: Secondary | ICD-10-CM | POA: Diagnosis present

## 2022-05-03 DIAGNOSIS — N179 Acute kidney failure, unspecified: Secondary | ICD-10-CM | POA: Diagnosis not present

## 2022-05-03 DIAGNOSIS — D72829 Elevated white blood cell count, unspecified: Secondary | ICD-10-CM | POA: Diagnosis not present

## 2022-05-03 DIAGNOSIS — E875 Hyperkalemia: Secondary | ICD-10-CM | POA: Diagnosis not present

## 2022-05-03 DIAGNOSIS — Z7984 Long term (current) use of oral hypoglycemic drugs: Secondary | ICD-10-CM

## 2022-05-03 DIAGNOSIS — E119 Type 2 diabetes mellitus without complications: Secondary | ICD-10-CM | POA: Diagnosis not present

## 2022-05-03 DIAGNOSIS — Z79899 Other long term (current) drug therapy: Secondary | ICD-10-CM

## 2022-05-03 DIAGNOSIS — E785 Hyperlipidemia, unspecified: Secondary | ICD-10-CM | POA: Diagnosis present

## 2022-05-03 DIAGNOSIS — M310 Hypersensitivity angiitis: Secondary | ICD-10-CM

## 2022-05-03 DIAGNOSIS — R739 Hyperglycemia, unspecified: Secondary | ICD-10-CM | POA: Diagnosis not present

## 2022-05-03 DIAGNOSIS — R410 Disorientation, unspecified: Secondary | ICD-10-CM | POA: Diagnosis not present

## 2022-05-03 DIAGNOSIS — I272 Pulmonary hypertension, unspecified: Secondary | ICD-10-CM | POA: Diagnosis present

## 2022-05-03 DIAGNOSIS — W06XXXA Fall from bed, initial encounter: Secondary | ICD-10-CM | POA: Diagnosis present

## 2022-05-03 DIAGNOSIS — Z86006 Personal history of melanoma in-situ: Secondary | ICD-10-CM

## 2022-05-03 DIAGNOSIS — Z833 Family history of diabetes mellitus: Secondary | ICD-10-CM

## 2022-05-03 DIAGNOSIS — N189 Chronic kidney disease, unspecified: Secondary | ICD-10-CM | POA: Diagnosis not present

## 2022-05-03 DIAGNOSIS — I4891 Unspecified atrial fibrillation: Secondary | ICD-10-CM | POA: Diagnosis not present

## 2022-05-03 DIAGNOSIS — K5900 Constipation, unspecified: Secondary | ICD-10-CM | POA: Diagnosis present

## 2022-05-03 DIAGNOSIS — Z9049 Acquired absence of other specified parts of digestive tract: Secondary | ICD-10-CM

## 2022-05-03 DIAGNOSIS — N1832 Chronic kidney disease, stage 3b: Secondary | ICD-10-CM | POA: Diagnosis present

## 2022-05-03 DIAGNOSIS — R000 Tachycardia, unspecified: Secondary | ICD-10-CM | POA: Diagnosis not present

## 2022-05-03 DIAGNOSIS — I129 Hypertensive chronic kidney disease with stage 1 through stage 4 chronic kidney disease, or unspecified chronic kidney disease: Secondary | ICD-10-CM | POA: Diagnosis not present

## 2022-05-03 DIAGNOSIS — D649 Anemia, unspecified: Principal | ICD-10-CM | POA: Diagnosis present

## 2022-05-03 DIAGNOSIS — D631 Anemia in chronic kidney disease: Secondary | ICD-10-CM | POA: Diagnosis not present

## 2022-05-03 LAB — URINALYSIS, W/ REFLEX TO CULTURE (INFECTION SUSPECTED)
Bilirubin Urine: NEGATIVE
Glucose, UA: NEGATIVE mg/dL
Ketones, ur: NEGATIVE mg/dL
Leukocytes,Ua: NEGATIVE
Nitrite: NEGATIVE
Protein, ur: NEGATIVE mg/dL
Specific Gravity, Urine: 1.015 (ref 1.005–1.030)
pH: 5.5 (ref 5.0–8.0)

## 2022-05-03 LAB — COMPREHENSIVE METABOLIC PANEL
ALT: 21 U/L (ref 0–44)
AST: 20 U/L (ref 15–41)
Albumin: 1.9 g/dL — ABNORMAL LOW (ref 3.5–5.0)
Alkaline Phosphatase: 52 U/L (ref 38–126)
Anion gap: 9 (ref 5–15)
BUN: 139 mg/dL — ABNORMAL HIGH (ref 8–23)
CO2: 18 mmol/L — ABNORMAL LOW (ref 22–32)
Calcium: 8.6 mg/dL — ABNORMAL LOW (ref 8.9–10.3)
Chloride: 105 mmol/L (ref 98–111)
Creatinine, Ser: 2.3 mg/dL — ABNORMAL HIGH (ref 0.61–1.24)
GFR, Estimated: 29 mL/min — ABNORMAL LOW (ref >60)
Glucose, Bld: 258 mg/dL — ABNORMAL HIGH (ref 70–99)
Potassium: 5.6 mmol/L — ABNORMAL HIGH (ref 3.5–5.1)
Sodium: 132 mmol/L — ABNORMAL LOW (ref 135–145)
Total Bilirubin: 0.2 mg/dL — ABNORMAL LOW (ref 0.3–1.2)
Total Protein: 4.7 g/dL — ABNORMAL LOW (ref 6.5–8.1)

## 2022-05-03 LAB — POC OCCULT BLOOD, ED: Fecal Occult Bld: POSITIVE — AB

## 2022-05-03 LAB — PREPARE RBC (CROSSMATCH)

## 2022-05-03 LAB — GLUCOSE, CAPILLARY: Glucose-Capillary: 216 mg/dL — ABNORMAL HIGH (ref 70–99)

## 2022-05-03 LAB — ABO/RH: ABO/RH(D): A POS

## 2022-05-03 LAB — PROTIME-INR
INR: 3.2 — ABNORMAL HIGH (ref 0.8–1.2)
Prothrombin Time: 32.6 seconds — ABNORMAL HIGH (ref 11.4–15.2)

## 2022-05-03 MED ORDER — FENTANYL CITRATE PF 50 MCG/ML IJ SOSY
50.0000 ug | PREFILLED_SYRINGE | Freq: Once | INTRAMUSCULAR | Status: AC
  Administered 2022-05-03: 50 ug via INTRAVENOUS
  Filled 2022-05-03: qty 1

## 2022-05-03 MED ORDER — OXYCODONE-ACETAMINOPHEN 5-325 MG PO TABS
1.0000 | ORAL_TABLET | Freq: Once | ORAL | Status: AC
  Administered 2022-05-03: 1 via ORAL
  Filled 2022-05-03: qty 1

## 2022-05-03 MED ORDER — INSULIN ASPART 100 UNIT/ML IJ SOLN
0.0000 [IU] | Freq: Three times a day (TID) | INTRAMUSCULAR | Status: DC
  Administered 2022-05-04: 1 [IU] via SUBCUTANEOUS
  Administered 2022-05-04 (×2): 2 [IU] via SUBCUTANEOUS
  Administered 2022-05-05: 5 [IU] via SUBCUTANEOUS
  Administered 2022-05-05 – 2022-05-06 (×3): 2 [IU] via SUBCUTANEOUS
  Administered 2022-05-06: 5 [IU] via SUBCUTANEOUS
  Administered 2022-05-07: 2 [IU] via SUBCUTANEOUS
  Administered 2022-05-07: 1 [IU] via SUBCUTANEOUS
  Administered 2022-05-07: 2 [IU] via SUBCUTANEOUS
  Administered 2022-05-08: 9 [IU] via SUBCUTANEOUS
  Administered 2022-05-08: 3 [IU] via SUBCUTANEOUS
  Administered 2022-05-08: 1 [IU] via SUBCUTANEOUS
  Administered 2022-05-09 (×2): 5 [IU] via SUBCUTANEOUS
  Administered 2022-05-10: 1 [IU] via SUBCUTANEOUS
  Administered 2022-05-10: 3 [IU] via SUBCUTANEOUS

## 2022-05-03 MED ORDER — TAMSULOSIN HCL 0.4 MG PO CAPS
0.4000 mg | ORAL_CAPSULE | Freq: Every day | ORAL | Status: DC
  Administered 2022-05-03 – 2022-05-05 (×3): 0.4 mg via ORAL
  Filled 2022-05-03 (×3): qty 1

## 2022-05-03 MED ORDER — PANTOPRAZOLE INFUSION (NEW) - SIMPLE MED
8.0000 mg/h | INTRAVENOUS | Status: AC
  Administered 2022-05-03 – 2022-05-06 (×7): 8 mg/h via INTRAVENOUS
  Filled 2022-05-03 (×8): qty 100

## 2022-05-03 MED ORDER — DULOXETINE HCL 20 MG PO CPEP
20.0000 mg | ORAL_CAPSULE | Freq: Every day | ORAL | Status: DC
  Administered 2022-05-03 – 2022-05-10 (×8): 20 mg via ORAL
  Filled 2022-05-03 (×8): qty 1

## 2022-05-03 MED ORDER — PANTOPRAZOLE SODIUM 40 MG IV SOLR
40.0000 mg | Freq: Once | INTRAVENOUS | Status: AC
  Administered 2022-05-03: 40 mg via INTRAVENOUS
  Filled 2022-05-03: qty 10

## 2022-05-03 MED ORDER — SODIUM CHLORIDE 0.9% FLUSH
3.0000 mL | Freq: Two times a day (BID) | INTRAVENOUS | Status: DC
  Administered 2022-05-03 – 2022-05-10 (×14): 3 mL via INTRAVENOUS

## 2022-05-03 MED ORDER — SIMVASTATIN 20 MG PO TABS
20.0000 mg | ORAL_TABLET | Freq: Every day | ORAL | Status: DC
  Administered 2022-05-03 – 2022-05-09 (×7): 20 mg via ORAL
  Filled 2022-05-03 (×7): qty 1

## 2022-05-03 MED ORDER — CEFDINIR 300 MG PO CAPS
300.0000 mg | ORAL_CAPSULE | Freq: Two times a day (BID) | ORAL | Status: DC
  Administered 2022-05-03 – 2022-05-06 (×6): 300 mg via ORAL
  Filled 2022-05-03 (×6): qty 1

## 2022-05-03 MED ORDER — OXYCODONE HCL 5 MG PO TABS
5.0000 mg | ORAL_TABLET | Freq: Three times a day (TID) | ORAL | Status: DC | PRN
  Administered 2022-05-05 – 2022-05-06 (×3): 5 mg via ORAL
  Filled 2022-05-03 (×3): qty 1

## 2022-05-03 MED ORDER — ALBUTEROL SULFATE (2.5 MG/3ML) 0.083% IN NEBU: 2.5000 mg | INHALATION_SOLUTION | Freq: Four times a day (QID) | RESPIRATORY_TRACT | Status: DC | PRN

## 2022-05-03 MED ORDER — VITAMIN K1 10 MG/ML IJ SOLN
5.0000 mg | Freq: Once | INTRAVENOUS | Status: AC
  Administered 2022-05-03: 5 mg via INTRAVENOUS
  Filled 2022-05-03: qty 0.5

## 2022-05-03 MED ORDER — SODIUM CHLORIDE 0.9% IV SOLUTION: Freq: Once | INTRAVENOUS | Status: DC

## 2022-05-03 MED ORDER — ACETAMINOPHEN 325 MG PO TABS: 650.0000 mg | ORAL_TABLET | Freq: Four times a day (QID) | ORAL | Status: DC | PRN

## 2022-05-03 MED ORDER — GABAPENTIN 100 MG PO CAPS
100.0000 mg | ORAL_CAPSULE | Freq: Three times a day (TID) | ORAL | Status: DC
  Administered 2022-05-03 – 2022-05-10 (×22): 100 mg via ORAL
  Filled 2022-05-03 (×22): qty 1

## 2022-05-03 MED ORDER — INSULIN ASPART 100 UNIT/ML IJ SOLN
0.0000 [IU] | Freq: Every day | INTRAMUSCULAR | Status: DC
  Administered 2022-05-05 – 2022-05-06 (×2): 3 [IU] via SUBCUTANEOUS
  Administered 2022-05-07: 2 [IU] via SUBCUTANEOUS

## 2022-05-03 MED ORDER — LACTATED RINGERS IV BOLUS
1000.0000 mL | Freq: Once | INTRAVENOUS | Status: AC
  Administered 2022-05-03: 1000 mL via INTRAVENOUS

## 2022-05-03 MED ORDER — SODIUM CHLORIDE 0.9% IV SOLUTION: Freq: Once | INTRAVENOUS | Status: AC

## 2022-05-03 MED ORDER — INSULIN ASPART 100 UNIT/ML IJ SOLN
0.0000 [IU] | Freq: Three times a day (TID) | INTRAMUSCULAR | Status: DC
  Administered 2022-05-03: 2 [IU] via SUBCUTANEOUS

## 2022-05-03 MED ORDER — PREDNISONE 5 MG PO TABS
5.0000 mg | ORAL_TABLET | Freq: Every day | ORAL | Status: DC
  Administered 2022-05-04 – 2022-05-05 (×2): 5 mg via ORAL
  Filled 2022-05-03 (×2): qty 1

## 2022-05-03 MED ORDER — PANTOPRAZOLE SODIUM 40 MG IV SOLR
40.0000 mg | Freq: Two times a day (BID) | INTRAVENOUS | Status: DC
  Administered 2022-05-06 – 2022-05-10 (×8): 40 mg via INTRAVENOUS
  Filled 2022-05-03 (×8): qty 10

## 2022-05-03 MED ORDER — LIDOCAINE HCL URETHRAL/MUCOSAL 2 % EX GEL
1.0000 | Freq: Once | CUTANEOUS | Status: AC
  Administered 2022-05-03: 1 via TOPICAL
  Filled 2022-05-03: qty 11

## 2022-05-03 MED ORDER — LIDOCAINE 5 % EX PTCH: 1.0000 | MEDICATED_PATCH | CUTANEOUS | Status: DC | PRN

## 2022-05-03 MED ORDER — ACETAMINOPHEN 650 MG RE SUPP: 650.0000 mg | Freq: Four times a day (QID) | RECTAL | Status: DC | PRN

## 2022-05-03 MED ORDER — LIDOCAINE 5 % EX PTCH: 1.0000 | MEDICATED_PATCH | Freq: Every day | CUTANEOUS | Status: DC | PRN

## 2022-05-03 NOTE — ED Provider Notes (Signed)
[facility] Provider Note   CSN: [id number] Arrival date & time: 05/03/22  [id number]     History  Chief Complaint  Patient presents with   Wound Infection   Loss of Consciousness    [name] is a 77 y.o. male.  HPI 77 year old male presents with syncope and weakness.  History is somewhat from the patient but mostly from the wife at the bedside.  He has a history of permanent A-fib and is on warfarin but recently has had supratherapeutic INRs.  He was recently admitted to [facility] this past week and got out about 3 days ago.  Is becoming progressively weaker.  Has had some on and off transient delirium that seems to be better right now.  Has been getting weak to the point that he is not able to walk to the bathroom.  No significant falls and no significant head injuries.  He had his INR checked 2 days ago but it was still high so he has not yet been started on the Eliquis.  He was admitted to [facility] for chronic dermatologic leg issues that they think might have been infected but the legs are much better.  He has not had a bowel movement in about a week and is starting to get lower abdominal pain and rectal pain.  When he did have some stool it was black.  He has not been on iron or Pepto-Bismol.  No chest pain, shortness of breath.  This morning, when the wife sat him up, he passed out and seem to shake for a little bit.  He had a recurrent syncopal episode with EMS.  Both times he seemed to wake up immediately and be back to his normal self and EMS did not seem to think this was a seizure.  Home Medications Prior to Admission medications   Medication Sig Start Date End Date Taking? Authorizing Provider  allopurinol (ZYLOPRIM) 100 MG tablet TAKE 1 TABLET BY MOUTH TWICE A DAY 01/09/22  Yes de [place], Raymond J, MD  cefdinir (OMNICEF) 300 MG capsule Take 300 mg by mouth 2 (two) times daily. 04/30/22  Yes [provider]  DULoxetine  (CYMBALTA) 20 MG capsule Take 1 capsule (20 mg total) by mouth daily. 04/16/22  Yes Chalmers Guest, FNP  famotidine (PEPCID) 20 MG tablet TAKE 1 TABLET BY MOUTH DAILY - KEEP APPOINTMENT FOR FURTHER REFILLS Patient taking differently: Take 20 mg by mouth daily. 01/23/22  Yes Daryel November, MD  furosemide (LASIX) 20 MG tablet Take 20 mg by mouth every other day. 02/07/18  Yes [provider]  gabapentin (NEURONTIN) 100 MG capsule Take 1 capsule (100 mg total) by mouth 3 (three) times daily. 04/16/22  Yes Chalmers Guest, FNP  glipiZIDE (GLUCOTROL XL) 2.5 MG 24 hr tablet TAKE ONE TABLET BY MOUTH DAILY WITH BREAKFAST 11/03/21  Yes de [place], Raymond J, MD  lidocaine (LIDODERM) 5 % Place 1 patch onto the skin as needed. Remove & Discard patch within 12 hours or as directed by MD   Yes [provider]  metoprolol tartrate (LOPRESSOR) 50 MG tablet TAKE 1 TABLET BY MOUTH TWICE A DAY 03/23/22  Yes de [place], Raymond J, MD  oxyCODONE (OXY IR/ROXICODONE) 5 MG immediate release tablet Take 5 mg by mouth 3 (three) times daily as needed. 04/30/22  Yes [provider]  predniSONE (DELTASONE) 10 MG tablet Take 5 mg by mouth daily with breakfast. 03/26/22 05/08/22 Yes [provider]  simvastatin (ZOCOR) 20 MG tablet Take 1 tablet (20 mg total) by mouth at bedtime. 03/17/22  Yes de [place], Raymond J, MD  tamsulosin (FLOMAX) 0.4 MG CAPS capsule Take 0.4 mg by mouth daily.   Yes [provider]  acetaminophen (TYLENOL) 500 MG tablet Take 1,000 mg by mouth as needed for mild pain.    [provider]  Glucose Blood (FREESTYLE LITE TEST VI)  12/08/12   [provider]  silver sulfADIAZINE (SILVADENE) 1 % cream Apply topically. Patient not taking: Reported on 05/03/2022 04/07/22   [provider]  triamcinolone ointment (KENALOG) 0.1 % Apply topically. Patient not taking: Reported on 05/03/2022 04/07/22   [provider]  warfarin (COUMADIN) 1 MG tablet TAKE  1 TABLET BY MOUTH ONCE WEEKLY IN ADDITION TO 2 MG Patient not taking: Reported on 05/03/2022 03/11/22   de [place], Blondell Reveal, MD  warfarin (COUMADIN) 2 MG tablet TAKE ONE TABLET BY MOUTH EVERY EVENING Patient not taking: Reported on 05/03/2022 03/23/22   de [place], Blondell Reveal, MD      Allergies    Atorvastatin and Lisinopril    Review of Systems   Review of Systems  Constitutional:  Negative for fever.  Respiratory:  Negative for shortness of breath.   Cardiovascular:  Negative for chest pain.  Gastrointestinal:  Positive for abdominal pain and constipation. Negative for vomiting.  Genitourinary:  Positive for difficulty urinating (recently put on flomax). Negative for dysuria.  Neurological:  Positive for syncope and weakness.  Psychiatric/Behavioral:  Positive for confusion.     Physical Exam Updated Vital Signs BP 102/61   Pulse 90   Temp (!) 97.4 F (36.3 C) (Oral)   Resp (!) 21   Ht 6' 3"$  (1.905 m)   Wt 96.6 kg   SpO2 99%   BMI 26.62 kg/m  Physical Exam Vitals and nursing note reviewed. Exam conducted with a chaperone present.  Constitutional:      Appearance: He is well-developed.  HENT:     Head: Normocephalic and atraumatic.  Cardiovascular:     Rate and Rhythm: Normal rate. Rhythm irregular.     Heart sounds: Normal heart sounds.     Comments: HR~100 Pulmonary:     Effort: Pulmonary effort is normal.     Breath sounds: Normal breath sounds. No wheezing.  Abdominal:     General: There is no distension.     Palpations: Abdomen is soft.     Tenderness: There is abdominal tenderness (mild) in the right lower quadrant, suprapubic area and left lower quadrant.  Genitourinary:    Rectum: Tenderness present.     Comments: 2 small buttock pressure wounds (superficial). Present since at [facility] per wife No obvious hemorrhoids. Has dark brown stool with no gross blood on DRE. Has fecal impaction Skin:    General: Skin is warm and dry.     Comments: Chronic darkened  erythema to bilateral lower extremities.  Has some wounds but at this point there is no overt purulent drainage or cellulitis.  Neurological:     Mental Status: He is alert.     Comments: Patient is awake and alert, normal strength in his upper extremities though he is quite weak and can barely lift his lower extremities off the stretcher.  The right seems a little worse than the left. Patient feels like right is weaker as well but he's not sure how long that's been. He's been using a cane for weeks, primarily to support his right  leg     ED Results / Procedures / Treatments   Labs (all labs ordered are listed, but only abnormal results are displayed) Labs Reviewed  COMPREHENSIVE METABOLIC PANEL - Abnormal; Notable for the following components:      Result Value   Sodium 132 (*)    Potassium 5.6 (*)    CO2 18 (*)    Glucose, Bld 258 (*)    BUN 139 (*)    Creatinine, Ser 2.30 (*)    Calcium 8.6 (*)    Total Protein 4.7 (*)    Albumin 1.9 (*)    Total Bilirubin 0.2 (*)    GFR, Estimated 29 (*)    All other components within normal limits  CBC WITH DIFFERENTIAL/PLATELET - Abnormal; Notable for the following components:   WBC 17.6 (*)    RBC 2.04 (*)    Hemoglobin 6.6 (*)    HCT 20.1 (*)    RDW 18.6 (*)    Neutro Abs 15.3 (*)    Monocytes Absolute 1.2 (*)    Abs Immature Granulocytes 0.30 (*)    All other components within normal limits  URINALYSIS, W/ REFLEX TO CULTURE (INFECTION SUSPECTED) - Abnormal; Notable for the following components:   Hgb urine dipstick SMALL (*)    Bacteria, UA RARE (*)    All other components within normal limits  PROTIME-INR - Abnormal; Notable for the following components:   Prothrombin Time 32.6 (*)    INR 3.2 (*)    All other components within normal limits  POC OCCULT BLOOD, ED - Abnormal; Notable for the following components:   Fecal Occult Bld POSITIVE (*)    All other components within normal limits  HEMOGLOBIN AND HEMATOCRIT, BLOOD  TYPE  AND SCREEN  PREPARE RBC (CROSSMATCH)  ABO/RH  PREPARE RBC (CROSSMATCH)    EKG EKG Interpretation  Date/Time:  Sunday May 03 2022 07:17:13 EST Ventricular Rate:  83 PR Interval:    QRS Duration: 80 QT Interval:  314 QTC Calculation: 369 R Axis:   17 Text Interpretation: Atrial fibrillation Ventricular premature complex Posterior infarct, old nonspecific ST changes No old tracing to compare Confirmed by Sherwood Gambler [phone]) on 05/03/2022 8:24:06 AM  Radiology DG Chest Portable 1 View  Result Date: 05/03/2022 CLINICAL DATA:  Weakness, lethargy and confusion. EXAM: PORTABLE CHEST 1 VIEW COMPARISON:  PA and lateral chest 03/18/2022. FINDINGS: The lungs are clear. Mild cardiomegaly. No pneumothorax or pleural fluid. No acute or focal bony IMPRESSION: No acute disease. Electronically Signed   By: Inge Rise M.D.   On: 05/03/2022 08:32    Procedures .Critical Care  Performed by: Sherwood Gambler, MD Authorized by: Sherwood Gambler, MD   Critical care provider statement:    Critical care time (minutes):  30   Critical care time was exclusive of:  Separately billable procedures and treating other patients   Critical care was necessary to treat or prevent imminent or life-threatening deterioration of the following conditions:  Circulatory failure   Critical care was time spent personally by me on the following activities:  Development of treatment plan with patient or surrogate, discussions with consultants, evaluation of patient's response to treatment, examination of patient, ordering and review of laboratory studies, ordering and review of radiographic studies, ordering and performing treatments and interventions, pulse oximetry, re-evaluation of patient's condition and review of old charts Fecal disimpaction  Date/Time: 05/03/2022 12:06 PM  Performed by: Sherwood Gambler, MD Authorized by: Sherwood Gambler, MD  Local anesthesia used: yes Anesthesia: see  MAR for details  (topical)  Anesthesia: Local anesthesia used: yes Local Anesthetic: lidocaine 2% without epinephrine  Sedation: Patient sedated: no  Patient tolerance: patient tolerated the procedure well with no immediate complications Comments: Small amount of firm stool removed. After this it was all mushy and no further impacted stool.       Medications Ordered in ED Medications  0.9 %  sodium chloride infusion (Manually program via Guardrails IV Fluids) (0 mLs Intravenous Hold 05/03/22 0921)  pantoprozole (PROTONIX) 80 mg /NS 100 mL infusion (has no administration in time range)  pantoprazole (PROTONIX) injection 40 mg (has no administration in time range)  0.9 %  sodium chloride infusion (Manually program via Guardrails IV Fluids) (0 mLs Intravenous Hold 05/03/22 1051)  pantoprazole (PROTONIX) injection 40 mg (has no administration in time range)  oxyCODONE (Oxy IR/ROXICODONE) immediate release tablet 5 mg (has no administration in time range)  DULoxetine (CYMBALTA) DR capsule 20 mg (has no administration in time range)  tamsulosin (FLOMAX) capsule 0.4 mg (has no administration in time range)  gabapentin (NEURONTIN) capsule 100 mg (has no administration in time range)  sodium chloride flush (NS) 0.9 % injection 3 mL (3 mLs Intravenous Not Given 05/03/22 1051)  acetaminophen (TYLENOL) tablet 650 mg (has no administration in time range)    Or  acetaminophen (TYLENOL) suppository 650 mg (has no administration in time range)  albuterol (PROVENTIL) (2.5 MG/3ML) 0.083% nebulizer solution 2.5 mg (has no administration in time range)  lidocaine (LIDODERM) 5 % 1 patch (has no administration in time range)  lactated ringers bolus 1,000 mL (0 mLs Intravenous Stopped 05/03/22 0922)  lidocaine (XYLOCAINE) 2 % jelly 1 Application (1 Application Topical Given 05/03/22 0827)  fentaNYL (SUBLIMAZE) injection 50 mcg (50 mcg Intravenous Given 05/03/22 0826)  pantoprazole (PROTONIX) injection 40 mg (40 mg Intravenous  Given 05/03/22 0826)  phytonadione (VITAMIN K) 5 mg in dextrose 5 % 50 mL IVPB (0 mg Intravenous Stopped 05/03/22 1020)  oxyCODONE-acetaminophen (PERCOCET/ROXICET) 5-325 MG per tablet 1 tablet (1 tablet Oral Given 05/03/22 1028)    ED Course/ Medical Decision Making/ A&P                             Medical Decision Making Amount and/or Complexity of Data Reviewed Labs: ordered.    Details: Hemoglobin has dropped from 9 at [facility] a few days ago to 6.6.  His BUN and creatinine have also increased.  INR is also mildly supratherapeutic at 3.2. Radiology: ordered and independent interpretation performed.    Details: No CHF or pneumonia ECG/medicine tests: ordered and independent interpretation performed.    Details: A-fib   Risk Prescription drug management. Decision regarding hospitalization.   Patient's generalized weakness appears to be due to GI bleed and symptomatic anemia.  He will be started on a unit of blood after verbal consent with him and wife.  He had a couple soft blood pressures but no outright hypotension.  He also has a fecal impaction which as above I disimpacted a small amount and then after this it was just mushy dark stool concerning for melena.  Wife reports some intermittent confusion but right now is not confused and seems to be at his baseline so I suspect he does not need a head CT as I have low suspicion for acute CNS emergency.  No head injuries.  Seems like the right leg weakness is more of a chronic problem and later on in the visit  he was moving his right lower extremity pretty freely.  Ultimately he will need admission I have discussed with Dr. Tamala Julian.  He was given vitamin K for the supratherapeutic INR.  He has not had Coumadin dose in several days.  I did discuss with [facility], Carl Best, advises to give another 40 mg IV Protonix and started on a drip.  He will be admitted to the hospitalist service.        Final Clinical  Impression(s) / ED Diagnoses Final diagnoses:  Symptomatic anemia  Acute GI bleeding  Fecal impaction [facility])    Rx / DC Orders ED Discharge Orders     None         Sherwood Gambler, MD 05/03/22 1210

## 2022-05-03 NOTE — ED Triage Notes (Signed)
Pt BIBA from home. Pt was recently d/c from [facility] for a leg infection. Pt is hot to the touch today, with increased lethargy and confusion. Pt taking cefdinir at home for leg infection. Pt had 2 syncopal episodes today, not responding for approx 1 min with fmaily and EMS. No fall either time, did not hit head, was sitting.  Pt has been unstable on his feet since d/c  Pt c/o constipation and generalized abd pain.    114/68 80 HR, afib 279 CBG 99.9 temp  650 tylenol 250cc Normal saline

## 2022-05-03 NOTE — H&P (Addendum)
History and Physical    [name] [id number] DOB: 1946/02/07 DOA: 05/03/2022 DOS: the patient was seen and examined on 05/03/2022 PCP: de [place], Raymond J, MD  Patient coming from: Home via EMS  Chief Complaint:  Chief Complaint  Patient presents with   Wound Infection   Loss of Consciousness   HPI: [name] is a 77 y.o. 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  History is obtained from patient with assistance of his wife present at bedside.  Patient had fallen while trying to get out of bed yesterday.  It was not thought that he lost consciousness at that time.  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  He had been given vitamin K and advised to hold his Coumadin with plans to start Eliquis once INR trended down to 2.  He had been discharged home on Keflex.  He had been constipated for the last week and had been taking stool softeners and efforts to have a bowel movement.  Reported having a small formed black stool earlier.  His wife had noted that he had been having trouble urinating during the hospitalization for which they started him on Flomax, but now he is having urinary incontinence and had to place him in adult briefs.  Denies having any nausea or vomiting symptoms, but did report feeling dizzy yesterday.  His wife had tried to get him up this morning and reported that the patient passed out and convulsed which she thought he was possibly having a seizure.  He was out for approximately a minute or less prior to coming to and  she had called EMS.  With EMS patient reportedly passed out again rhythmically when they tried to get a monitor as well.  He has been off of Coumadin since 2/10.  In the emergency department patient was noted to be afebrile with soft blood pressures, and all other vital signs relatively maintained.  Labs significant for WBC 17.6, hemoglobin 6.6, sodium 132, potassium 5.6, CO2 18, BUN 139, creatinine 2.3, glucose 258, anion gap 9, albumin 1.9, and INR 3.2.  Stool was noted to be black and guaiac positive.  Gastroenterology has been consulted.  Patient was typed and screened for possible need of blood products and ordered to be transfused 1 unit of PRBCs.  Patient also had been given 1 L normal saline IV fluids, fentanyl 50 mcg, oxycodone, and vitamin K 5 mg IV.  Review of Systems: As mentioned in the history of present illness. All other systems reviewed and are negative. Past Medical History:  Diagnosis Date   Atrial fibrillation (HCC)    Chronic anticoagulation    Complication of anesthesia    ' hard time waking up" per patient    Diabetes mellitus    type 2   GERD (gastroesophageal reflux disease)    Hyperlipidemia    Hypertension    Mitral regurgitation    a. 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HCC)    chronic atrial fib   Pulmonary hypertension ([place])    Pulmonary hypertension (HCC)    Past Surgical History:  Procedure Laterality  Date   CARDIOVERSION     CHOLECYSTECTOMY N/A 03/01/2015   Procedure: LAPAROSCOPIC CHOLECYSTECTOMY;  Surgeon: Ralene Ok, MD;  Location: [facility];  Service: General;  Laterality: N/A;   spur on heel     Social History:  reports that he has never smoked. He has never used smokeless tobacco. He reports that he does not currently use alcohol. He reports that he does not use drugs.  Allergies  Allergen Reactions   Atorvastatin Other (See Comments)    myalgia   Lisinopril      Other reaction(s): Hyperkalemia, Renal impairment    Family History  Problem Relation Age of Onset   Hypertension Mother    Alzheimer's disease Mother    Diabetes Mother    Heart disease Father        cabgx2, respiratory failure, pacemaker   Diabetes Brother    Hyperlipidemia Brother     Prior to Admission medications   Medication Sig Start Date End Date Taking? Authorizing Provider  allopurinol (ZYLOPRIM) 100 MG tablet TAKE 1 TABLET BY MOUTH TWICE A DAY 01/09/22  Yes de [place], Raymond J, MD  cefdinir (OMNICEF) 300 MG capsule Take 300 mg by mouth 2 (two) times daily. 04/30/22  Yes [provider]  DULoxetine (CYMBALTA) 20 MG capsule Take 1 capsule (20 mg total) by mouth daily. 04/16/22  Yes Chalmers Guest, FNP  famotidine (PEPCID) 20 MG tablet TAKE 1 TABLET BY MOUTH DAILY - KEEP APPOINTMENT FOR FURTHER REFILLS Patient taking differently: Take 20 mg by mouth daily. 01/23/22  Yes Daryel November, MD  furosemide (LASIX) 20 MG tablet Take 20 mg by mouth every other day. 02/07/18  Yes [provider]  gabapentin (NEURONTIN) 100 MG capsule Take 1 capsule (100 mg total) by mouth 3 (three) times daily. 04/16/22  Yes Chalmers Guest, FNP  glipiZIDE (GLUCOTROL XL) 2.5 MG 24 hr tablet TAKE ONE TABLET BY MOUTH DAILY WITH BREAKFAST 11/03/21  Yes de [place], Raymond J, MD  lidocaine (LIDODERM) 5 % Place 1 patch onto the skin as needed. Remove & Discard patch within 12 hours or as directed by MD   Yes [provider]  metoprolol tartrate (LOPRESSOR) 50 MG tablet TAKE 1 TABLET BY MOUTH TWICE A DAY 03/23/22  Yes de [place], Raymond J, MD  oxyCODONE (OXY IR/ROXICODONE) 5 MG immediate release tablet Take 5 mg by mouth 3 (three) times daily as needed. 04/30/22  Yes [provider]  predniSONE (DELTASONE) 10 MG tablet Take 5 mg by mouth daily with breakfast. 03/26/22 05/08/22 Yes [provider]  simvastatin (ZOCOR) 20 MG tablet Take 1 tablet (20 mg total) by mouth at bedtime.  03/17/22  Yes de [place], Raymond J, MD  tamsulosin (FLOMAX) 0.4 MG CAPS capsule Take 0.4 mg by mouth daily.   Yes [provider]  acetaminophen (TYLENOL) 500 MG tablet Take 1,000 mg by mouth as needed for mild pain.    [provider]  Glucose Blood (FREESTYLE LITE TEST VI)  12/08/12   [provider]  silver sulfADIAZINE (SILVADENE) 1 % cream Apply topically. Patient not taking: Reported on 05/03/2022 04/07/22   [provider]  tadalafil (CIALIS) 10 MG tablet TAKE 1 TABLET BY MOUTH DAILY Patient not taking: Reported on 05/03/2022 03/30/22   de [place], Blondell Reveal, MD  triamcinolone ointment (KENALOG) 0.1 % Apply topically. Patient not taking: Reported on 05/03/2022 04/07/22   [provider]  warfarin (COUMADIN) 1 MG tablet TAKE 1 TABLET BY MOUTH ONCE WEEKLY  IN ADDITION TO 2 MG Patient not taking: Reported on 05/03/2022 03/11/22   de [place], Blondell Reveal, MD  warfarin (COUMADIN) 2 MG tablet TAKE ONE TABLET BY MOUTH EVERY EVENING Patient not taking: Reported on 05/03/2022 03/23/22   de [place], Raymond J, MD    Physical Exam: Vitals:   05/03/22 0949 05/03/22 0949 05/03/22 1002 05/03/22 1017  BP: 92/72  106/79 116/73  Pulse: 92  92 99  Resp: 18  16 16  $ Temp:  (!) 97.4 F (36.3 C) (!) 97.4 F (36.3 C) (!) 97.4 F (36.3 C)  TempSrc:  Oral Oral Oral  SpO2: 100%  99% 100%  Weight:      Height:       Constitutional: Elderly male who appears to be in some discomfort. Eyes: PERRL, lids and conjunctivae normal ENMT: Mucous membranes are moist.     Neck: normal, supple  Respiratory: clear to auscultation bilaterally, no wheezing, no crackles. Normal respiratory effort. No accessory muscle use.  Cardiovascular: Irregular irregular, no murmurs / rubs / gallops.   Abdomen: no tenderness, no masses palpated.   Bowel sounds positive.  Musculoskeletal: no clubbing / cyanosis. No joint deformity upper and lower extremities. Good ROM, no contractures. Normal muscle tone.   Skin: Multiple ulcerations noted of the lower extremities as well as the buttock.  Lower extremities as pictured below    Neurologic: CN 2-12 grossly intact.  normal. Strength 5/5 in all 4.  Psychiatric: Normal judgment and insight. Alert and oriented x 3. Normal mood.   Data Reviewed:  EKG revealed atrial fibrillation 87 bpm with preventricular complexes.  Reviewed labs, imaging, and pertinent records as noted above in the HPI.    Assessment and Plan:  Syncope Symptomatic anemia secondary to upper GI bleed Acute.  Patient presents after having 2 syncopal episodes at home.  Hemoglobin 6.6, but previously had been 9.4 just 3 days ago.  Melena reported on exam which was positive for blood.  Patient has been typed and screened and ordered 1 unit of packed red blood cells.  Elevated BUN also points to a likely upper GI source of bleeding.  Suspect syncopal episode secondary to acute blood loss. -Admit to a progressive bed -N.p.o.   -Protonix drip per protocol -Transfuse a total of 2 units of PRBCs -Serial monitoring of H&H -Follow-up telemetry -[facility] consulted, will follow-up for any further recommendations  Leukocytosis WBC elevated at 17.6.  Thought likely reactive to acute bleed. -Recheck CBC tomorrow morning  Supratherapeutic INR Persistent atrial fibrillation INR noted to be 3.2 despite him being been off of Coumadin Coumadin since 2/10.  Patient is relatively rate controlled.  Patient had been ordered vitamin K 5 mg IV.   -Given 1 unit of FFP -Monitor INR daily  Acute kidney injury superimposed on chronic kidney disease stage IIIb Patient presents with creatinine elevated up to 2.3 with BUN of 139.  Baseline hemoglobin has been around 1.5 prior to leaving hospital 3 days ago. -Monitor intake and output -Avoid nephrotoxic agents -Continue to monitor kidney function daily  Hyperkalemia Acute.  Potassium elevated at 5.6.  Patient had received 1 L normal saline IV  fluids. -Will continue to monitor and treat as needed  Essential hypertension Blood pressures were initially noted to be soft.  Home blood pressure regimen includes metoprolol 50 mg twice daily and furosemide 20 mg every other day. -Hold home blood pressure regimen and reassess when medically appropriate to the start  Diabetes mellitus type 2, without long-term use  of insulin Last hemoglobin A1c was 6.7 on 03/24/2022. -Hypoglycemic protocols -Hold glipizide -CBGs before every meal with  sensitive SSI  Leukocytoclastic vasculitis Chronic venous stasis Recent cellulitis of the lower extremities Patient had just recently been treated at [facility] with concern for cellulitis of the lower extremities history of chronic wounds that was biopsy-proven for leukocytoclastic vasculitis. -Continue wound care:Recommend continue to keep ulcers covered with Gentian violet to dry up the wound bed and prevent additional infection and Unna boot on top. -Continue prednisone taper -Continue cefdinir -Continue outpatient follow-up with dermatology in outpatient setting  Hyperlipidemia -Continue atorvastatin  BPH -Continue tamsulosin  GERD -Pharmacy substitution of Protonix   DVT prophylaxis: None  Advance Care Planning:   Code Status: Full Code    Consults: GI  Family Communication: Wife updated at  bedside  Severity of Illness: The appropriate patient status for this patient is OBSERVATION. Observation status is judged to be reasonable and necessary in order to provide the required intensity of service to ensure the patient's safety. The patient's presenting symptoms, physical exam findings, and initial radiographic and laboratory data in the context of their medical condition is felt to place them at decreased risk for further clinical deterioration. Furthermore, it is anticipated that the patient will be medically stable for discharge from the hospital within 2 midnights of admission.    Author: Norval Morton, MD 05/03/2022 10:26 AM  For on call review [email or web address].

## 2022-05-03 NOTE — H&P (View-Only) (Signed)
Referring Provider: Dr. Sherwood Gambler Primary Care Physician:  de [place], Blondell Reveal, MD Primary Gastroenterologist:  Dr. Candis Schatz  Reason for Consultation: Anemia, black stools  HPI: [name] is a 77 y.o. 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 S/P cholecystectomy/2016.  He was admitted to [facility] 2/12 - 04/30/2022 secondary to progressive cellulitis to his lower extremities, leukocytoclastic vasculitis with a supratherapeutic INR.  He had bleeding from his leg wounds which abated after he received vitamin K.  Warfarin was held and he received IV antibiotics for his bilateral leg cellulitis/wounds. His INR improved and was discharged home on oral Keflex.  His cardiology team planned on switching him to Eliquis when his INR normalized.  He presented to the ED today after he awakened with profound weakness with syncope x 2.  Labs in the ED showed a WBC count of 17.6.  Hemoglobin 6.6 (Hg 9.4 on 04/30/2022). HCT  20.1.  Platelet 151.  Sodium 132.  Potassium 5.6.  Glucose 258.  BUN 139.  Creatinine 2.30.  Total bili 0.2.  Alk phos 52.  AST 20.  ALT 21.  INR 3.2.  FOBT positive.  He was transfused 1 unit of PRBCs. A second unit of PRBCs ordered by the hospitalist, not yet transfused. Received Protonix 40 mg IV x 2 then started on a continuous infusion.  He developed constipation during his recent hospitalization, no BM for about 1 week.  He took 4 Dulcolax tablets on Friday 2/16 and Saturday 05/02/2022. He awakened earlier today with profound weakness with 2 brief syncopal episodes as reported by his wife.  He passed a small formed black stool earlier this morning. The ED physician manually removed a small amount of firm  black stool from the rectal and the patient subsequently passed several small soft to loose black stools.  He denies having any dysphagia or heartburn.  No upper or lower abdominal pain.  No rectal pain.  His last dose of Warfarin was Saturday 04/25/2022.  He remains in A-fib with controlled rate.  Hemodynamically stable.  He was previously followed by [facility] then transitioned his GI management to Dr. Candis Schatz in 2022.  Was last seen in our GI outpatient clinic 12/02/2021 to further discuss scheduling an EGD and colonoscopy which were previously recommended but were not done.  At that time, the patient stated he was very busy and would consider colonoscopy 03/2022 which was not done as he was undergoing treatment for his leg cellulitis/wounds.  GI PROCEDURES:  EGD 05/07/2017 by Dr. Wilford Corner with [facility]: Normal esophagus, Z-line regular, 45 cm from incisors Normal stomach Normal examined duodenum No specimens collected  Colonoscopy was at the [place], [place] in 2012 and was normal per patient    Past Medical History:  Diagnosis Date   Atrial fibrillation (HCC)    Chronic anticoagulation    Complication of anesthesia    ' hard time waking up" per patient    Diabetes mellitus    type 2   GERD (gastroesophageal reflux disease)    Hyperlipidemia    Hypertension    Mitral regurgitation    a. 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HCC)    chronic atrial fib   Pulmonary hypertension (  [place])    Pulmonary hypertension ([place])     Past Surgical History:  Procedure Laterality Date   CARDIOVERSION     CHOLECYSTECTOMY N/A 03/01/2015   Procedure: LAPAROSCOPIC CHOLECYSTECTOMY;  Surgeon: Ralene Ok, MD;  Location: [facility]S;  Service: General;  Laterality: N/A;   spur on heel      Prior to Admission medications   Medication Sig Start Date End Date Taking? Authorizing Provider  allopurinol (ZYLOPRIM) 100 MG  tablet TAKE 1 TABLET BY MOUTH TWICE A DAY 01/09/22  Yes de [place], Raymond J, MD  cefdinir (OMNICEF) 300 MG capsule Take 300 mg by mouth 2 (two) times daily. 04/30/22  Yes [provider]  DULoxetine (CYMBALTA) 20 MG capsule Take 1 capsule (20 mg total) by mouth daily. 04/16/22  Yes Chalmers Guest, FNP  famotidine (PEPCID) 20 MG tablet TAKE 1 TABLET BY MOUTH DAILY - KEEP APPOINTMENT FOR FURTHER REFILLS Patient taking differently: Take 20 mg by mouth daily. 01/23/22  Yes Daryel November, MD  furosemide (LASIX) 20 MG tablet Take 20 mg by mouth every other day. 02/07/18  Yes [provider]  gabapentin (NEURONTIN) 100 MG capsule Take 1 capsule (100 mg total) by mouth 3 (three) times daily. 04/16/22  Yes Chalmers Guest, FNP  glipiZIDE (GLUCOTROL XL) 2.5 MG 24 hr tablet TAKE ONE TABLET BY MOUTH DAILY WITH BREAKFAST 11/03/21  Yes [place], Raymond J, MD  lidocaine (LIDODERM) 5 % Place 1 patch onto the skin as needed. Remove & Discard patch within 12 hours or as directed by MD   Yes [provider]  metoprolol tartrate (LOPRESSOR) 50 MG tablet TAKE 1 TABLET BY MOUTH TWICE A DAY 03/23/22  Yes [place], Raymond J, MD  oxyCODONE (OXY IR/ROXICODONE) 5 MG immediate release tablet Take 5 mg by mouth 3 (three) times daily as needed. 04/30/22  Yes [provider]  predniSONE (DELTASONE) 10 MG tablet Take 5 mg by mouth daily with breakfast. 03/26/22 05/08/22 Yes [provider]  simvastatin (ZOCOR) 20 MG tablet Take 1 tablet (20 mg total) by mouth at bedtime. 03/17/22  Yes [place], Raymond J, MD  tamsulosin (FLOMAX) 0.4 MG CAPS capsule Take 0.4 mg by mouth daily.   Yes [provider]  acetaminophen (TYLENOL) 500 MG tablet Take 1,000 mg by mouth as needed for mild pain.    [provider]  Glucose Blood (FREESTYLE LITE TEST VI)  12/08/12   [provider]  silver sulfADIAZINE (SILVADENE) 1 % cream Apply topically. Patient not taking: Reported on 05/03/2022  04/07/22   [provider]  tadalafil (CIALIS) 10 MG tablet TAKE 1 TABLET BY MOUTH DAILY Patient not taking: Reported on 05/03/2022 03/30/22   [place], Blondell Reveal, MD  triamcinolone ointment (KENALOG) 0.1 % Apply topically. Patient not taking: Reported on 05/03/2022 04/07/22   [provider]  warfarin (COUMADIN) 1 MG tablet TAKE 1 TABLET BY MOUTH ONCE WEEKLY IN ADDITION TO 2 MG Patient not taking: Reported on 05/03/2022 03/11/22   [place], Blondell Reveal, MD  warfarin (COUMADIN) 2 MG tablet TAKE ONE TABLET BY MOUTH EVERY EVENING Patient not taking: Reported on 05/03/2022 03/23/22   [place], Blondell Reveal, MD    Current Facility-Administered Medications  Medication Dose Route Frequency Provider Last Rate Last Admin   0.9 %  sodium chloride infusion (Manually program via Guardrails IV Fluids)   Intravenous Once Sherwood Gambler, MD   Held at 05/03/22 0921   0.9 %  sodium chloride infusion (  Manually program via Guardrails IV Fluids)   Intravenous Once Norval Morton, MD       [START ON 05/06/2022] pantoprazole (PROTONIX) injection 40 mg  40 mg Intravenous Q12H Smith, Rondell A, MD       pantoprazole (PROTONIX) injection 40 mg  40 mg Intravenous Once Sherwood Gambler, MD       pantoprozole (PROTONIX) 80 mg /NS 100 mL infusion  8 mg/hr Intravenous Continuous Norval Morton, MD       Current Outpatient Medications  Medication Sig Dispense Refill   allopurinol (ZYLOPRIM) 100 MG tablet TAKE 1 TABLET BY MOUTH TWICE A DAY 120 tablet 0   cefdinir (OMNICEF) 300 MG capsule Take 300 mg by mouth 2 (two) times daily.     DULoxetine (CYMBALTA) 20 MG capsule Take 1 capsule (20 mg total) by mouth daily. 30 capsule 1   famotidine (PEPCID) 20 MG tablet TAKE 1 TABLET BY MOUTH DAILY - KEEP APPOINTMENT FOR FURTHER REFILLS (Patient taking differently: Take 20 mg by mouth daily.) 90 tablet 3   furosemide (LASIX) 20 MG tablet Take 20 mg by mouth every other day.     gabapentin (NEURONTIN) 100 MG capsule Take 1  capsule (100 mg total) by mouth 3 (three) times daily. 90 capsule 0   glipiZIDE (GLUCOTROL XL) 2.5 MG 24 hr tablet TAKE ONE TABLET BY MOUTH DAILY WITH BREAKFAST 90 tablet 1   lidocaine (LIDODERM) 5 % Place 1 patch onto the skin as needed. Remove & Discard patch within 12 hours or as directed by MD     metoprolol tartrate (LOPRESSOR) 50 MG tablet TAKE 1 TABLET BY MOUTH TWICE A DAY 180 tablet 0   oxyCODONE (OXY IR/ROXICODONE) 5 MG immediate release tablet Take 5 mg by mouth 3 (three) times daily as needed.     predniSONE (DELTASONE) 10 MG tablet Take 5 mg by mouth daily with breakfast.     simvastatin (ZOCOR) 20 MG tablet Take 1 tablet (20 mg total) by mouth at bedtime. 90 tablet 0   tamsulosin (FLOMAX) 0.4 MG CAPS capsule Take 0.4 mg by mouth daily.     acetaminophen (TYLENOL) 500 MG tablet Take 1,000 mg by mouth as needed for mild pain.     Glucose Blood (FREESTYLE LITE TEST VI)      silver sulfADIAZINE (SILVADENE) 1 % cream Apply topically. (Patient not taking: Reported on 05/03/2022)     tadalafil (CIALIS) 10 MG tablet TAKE 1 TABLET BY MOUTH DAILY (Patient not taking: Reported on 05/03/2022) 30 tablet 1   triamcinolone ointment (KENALOG) 0.1 % Apply topically. (Patient not taking: Reported on 05/03/2022)     warfarin (COUMADIN) 1 MG tablet TAKE 1 TABLET BY MOUTH ONCE WEEKLY IN ADDITION TO 2 MG (Patient not taking: Reported on 05/03/2022) 5 tablet 1   warfarin (COUMADIN) 2 MG tablet TAKE ONE TABLET BY MOUTH EVERY EVENING (Patient not taking: Reported on 05/03/2022) 30 tablet 1    Allergies as of 05/03/2022 - Review Complete 05/03/2022  Allergen Reaction Noted   Atorvastatin Other (See Comments) 02/05/2010   Lisinopril  10/05/2016    Family History  Problem Relation Age of Onset   Hypertension Mother    Alzheimer's disease Mother    Diabetes Mother    Heart disease Father        cabgx2, respiratory failure, pacemaker   Diabetes Brother    Hyperlipidemia Brother     Social History    Socioeconomic History   Marital status: Married    Spouse  name: Not on file   Number of children: 2   Years of education: Not on file   Highest education level: Not on file  Occupational History   Occupation: retired Biomedical engineer  Tobacco Use   Smoking status: Never   Smokeless tobacco: Never  Vaping Use   Vaping Use: Never used  Substance and Sexual Activity   Alcohol use: Not Currently    Comment: margarita    Drug use: No   Sexual activity: Not on file  Other Topics Concern   Not on file  Social History Narrative   Not on file   Social Determinants of Health   Financial Resource Strain: Not on file  Food Insecurity: Not on file  Transportation Needs: Not on file  Physical Activity: Not on file  Stress: Not on file  Social Connections: Not on file  Intimate Partner Violence: Not on file    Review of Systems: Gen: + Fatigue. Denies fever, sweats or chills. No weight loss.  CV: Denies chest pain, palpitations or edema. Resp: Denies cough, shortness of breath of hemoptysis.  GI: See HPI.  GU : Denies urinary burning, blood in urine, increased urinary frequency or incontinence. MS: Denies joint pain, muscles aches or weakness. Derm: See HPI.   Psych: Denies depression, anxiety, memory loss or confusion. Heme: Denies easy bruising, bleeding. Neuro:  Denies headaches, dizziness or paresthesias. Endo:  + DM type II.  Physical Exam: Vital signs in last 24 hours: Temp:  [97.4 F (36.3 C)-98.8 F (37.1 C)] 97.4 F (36.3 C) (02/18 1017) Pulse Rate:  [87-100] 90 (02/18 1030) Resp:  [16-22] 21 (02/18 1030) BP: (92-116)/(61-79) 102/61 (02/18 1030) SpO2:  [99 %-100 %] 99 % (02/18 1030) Weight:  [96.6 kg] 96.6 kg (02/18 0715)   General: 77 year old chronically ill-appearing male. Head:  Normocephalic and atraumatic. Eyes:  No scleral icterus. Conjunctiva pink. Ears:  Normal auditory acuity. Nose:  No deformity, discharge or lesions. Mouth:  Dentition intact.  No ulcers or lesions.  Neck:  Supple. No lymphadenopathy or thyromegaly.  Lungs: Breath sounds clear throughout. No wheezes, rhonchi or crackles.  Heart: Irregular rhythm, no murmurs. Abdomen: Soft, nondistended.  Positive bowel sounds to all 4 quadrants.  Midline abdominal scar intact.  No palpable mass. Rectal: Deferred. Musculoskeletal:  Symmetrical without gross deformities.  Pulses:  Normal pulses noted. Extremities: Lateral lower extremities with stasis dermatitis, scattered erythematous lesions without exudate. Neurologic:  Alert and  oriented x 4. No focal deficits.  Skin:  Intact without significant lesions or rashes. Psych:  Alert and cooperative. Normal mood and affect.  Intake/Output from previous day: No intake/output data recorded. Intake/Output this shift: Total I/O In: 300 [I.V.:250; IV Piggyback:50] Out: -   Lab Results: Recent Labs    05/03/22 0755  WBC 17.6*  HGB 6.6*  HCT 20.1*  PLT 151   BMET Recent Labs    05/03/22 0755  NA 132*  K 5.6*  CL 105  CO2 18*  GLUCOSE 258*  BUN 139*  CREATININE 2.30*  CALCIUM 8.6*   LFT Recent Labs    05/03/22 0755  PROT 4.7*  ALBUMIN 1.9*  AST 20  ALT 21  ALKPHOS 52  BILITOT 0.2*   PT/INR Recent Labs    05/01/22 0818 05/03/22 0755  LABPROT 33.6* 32.6*  INR 3.4* 3.2*   Hepatitis Panel No results for input(s): "HEPBSAG", "HCVAB", "HEPAIGM", "HEPBIGM" in the last 72 hours.    Studies/Results: DG Chest Portable 1 View  Result Date: 05/03/2022 CLINICAL  DATA:  Weakness, lethargy and confusion. EXAM: PORTABLE CHEST 1 VIEW COMPARISON:  PA and lateral chest 03/18/2022. FINDINGS: The lungs are clear. Mild cardiomegaly. No pneumothorax or pleural fluid. No acute or focal bony IMPRESSION: No acute disease. Electronically Signed   By: Inge Rise M.D.   On: 05/03/2022 08:32    IMPRESSION/PLAN:  77 year old male presented to the ED with weakness and syncope with associated profound anemia and melena.  Hemoglobin 6.6 (Hg 9.4 on 04/30/2022).  Transfused 1 unit of PRBCs, second unit ordered not yet transfused.  On PPI infusion.  Last dose of Warfarin was 04/25/2022.  Hemodynamically stable.  Normal EGD in 2019. -Clear liquid diet -Continue PPI infusion -Check H&H posttransfusion and then every 6 hours x 24 hours -Transfuse to maintain hemoglobin level 7 - 8 -EGD, timing to be determined -Eventual colonoscopy -Await further recommendations per Dr. Candis Schatz  Atrial fibrillation -Continue to hold Warfarin  Lateral lower extremity cellulitis/wound -Wound care consult requested by the hospitalist  AKI on CKD  Leukocytosis, likely due to lower extremity cellulitis/wounds     Noralyn Pick  05/03/2022, 12:40PM

## 2022-05-03 NOTE — Consult Note (Addendum)
Referring Provider: Dr. Sherwood Gambler Primary Care Physician:  de [place], Blondell Reveal, MD Primary Gastroenterologist:  Dr. Candis Schatz  Reason for Consultation: Anemia, black stools  HPI: [name] is a 77 y.o. 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 S/P cholecystectomy/2016.  He was admitted to [facility] 2/12 - 04/30/2022 secondary to progressive cellulitis to his lower extremities, leukocytoclastic vasculitis with a supratherapeutic INR.  He had bleeding from his leg wounds which abated after he received vitamin K.  Warfarin was held and he received IV antibiotics for his bilateral leg cellulitis/wounds. His INR improved and was discharged home on oral Keflex.  His cardiology team planned on switching him to Eliquis when his INR normalized.  He presented to the ED today after he awakened with profound weakness with syncope x 2.  Labs in the ED showed a WBC count of 17.6.  Hemoglobin 6.6 (Hg 9.4 on 04/30/2022). HCT  20.1.  Platelet 151.  Sodium 132.  Potassium 5.6.  Glucose 258.  BUN 139.  Creatinine 2.30.  Total bili 0.2.  Alk phos 52.  AST 20.  ALT 21.  INR 3.2.  FOBT positive.  He was transfused 1 unit of PRBCs. A second unit of PRBCs ordered by the hospitalist, not yet transfused. Received Protonix 40 mg IV x 2 then started on a continuous infusion.  He developed constipation during his recent hospitalization, no BM for about 1 week.  He took 4 Dulcolax tablets on Friday 2/16 and Saturday 05/02/2022. He awakened earlier today with profound weakness with 2 brief syncopal episodes as reported by his wife.  He passed a small formed black stool earlier this morning. The ED physician manually removed a small amount of firm  black stool from the rectal and the patient subsequently passed several small soft to loose black stools.  He denies having any dysphagia or heartburn.  No upper or lower abdominal pain.  No rectal pain.  His last dose of Warfarin was Saturday 04/25/2022.  He remains in A-fib with controlled rate.  Hemodynamically stable.  He was previously followed by [facility] then transitioned his GI management to Dr. Candis Schatz in 2022.  Was last seen in our GI outpatient clinic 12/02/2021 to further discuss scheduling an EGD and colonoscopy which were previously recommended but were not done.  At that time, the patient stated he was very busy and would consider colonoscopy 03/2022 which was not done as he was undergoing treatment for his leg cellulitis/wounds.  GI PROCEDURES:  EGD 05/07/2017 by Dr. Wilford Corner with [facility]: Normal esophagus, Z-line regular, 45 cm from incisors Normal stomach Normal examined duodenum No specimens collected  Colonoscopy was at the [place], [place] in 2012 and was normal per patient    Past Medical History:  Diagnosis Date   Atrial fibrillation (HCC)    Chronic anticoagulation    Complication of anesthesia    ' hard time waking up" per patient    Diabetes mellitus    type 2   GERD (gastroesophageal reflux disease)    Hyperlipidemia    Hypertension    Mitral regurgitation    a. 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HCC)    chronic atrial fib   Pulmonary hypertension (  [place])    Pulmonary hypertension ([place])     Past Surgical History:  Procedure Laterality Date   CARDIOVERSION     CHOLECYSTECTOMY N/A 03/01/2015   Procedure: LAPAROSCOPIC CHOLECYSTECTOMY;  Surgeon: Ralene Ok, MD;  Location: [facility];  Service: General;  Laterality: N/A;   spur on heel      Prior to Admission medications   Medication Sig Start Date End Date Taking? Authorizing Provider  allopurinol (ZYLOPRIM) 100 MG  tablet TAKE 1 TABLET BY MOUTH TWICE A DAY 01/09/22  Yes de [place], Raymond J, MD  cefdinir (OMNICEF) 300 MG capsule Take 300 mg by mouth 2 (two) times daily. 04/30/22  Yes [provider]  DULoxetine (CYMBALTA) 20 MG capsule Take 1 capsule (20 mg total) by mouth daily. 04/16/22  Yes Chalmers Guest, FNP  famotidine (PEPCID) 20 MG tablet TAKE 1 TABLET BY MOUTH DAILY - KEEP APPOINTMENT FOR FURTHER REFILLS Patient taking differently: Take 20 mg by mouth daily. 01/23/22  Yes Daryel November, MD  furosemide (LASIX) 20 MG tablet Take 20 mg by mouth every other day. 02/07/18  Yes [provider]  gabapentin (NEURONTIN) 100 MG capsule Take 1 capsule (100 mg total) by mouth 3 (three) times daily. 04/16/22  Yes Chalmers Guest, FNP  glipiZIDE (GLUCOTROL XL) 2.5 MG 24 hr tablet TAKE ONE TABLET BY MOUTH DAILY WITH BREAKFAST 11/03/21  Yes [place], Raymond J, MD  lidocaine (LIDODERM) 5 % Place 1 patch onto the skin as needed. Remove & Discard patch within 12 hours or as directed by MD   Yes [provider]  metoprolol tartrate (LOPRESSOR) 50 MG tablet TAKE 1 TABLET BY MOUTH TWICE A DAY 03/23/22  Yes de [place], Raymond J, MD  oxyCODONE (OXY IR/ROXICODONE) 5 MG immediate release tablet Take 5 mg by mouth 3 (three) times daily as needed. 04/30/22  Yes [provider]  predniSONE (DELTASONE) 10 MG tablet Take 5 mg by mouth daily with breakfast. 03/26/22 05/08/22 Yes [provider]  simvastatin (ZOCOR) 20 MG tablet Take 1 tablet (20 mg total) by mouth at bedtime. 03/17/22  Yes [place], Raymond J, MD  tamsulosin (FLOMAX) 0.4 MG CAPS capsule Take 0.4 mg by mouth daily.   Yes [provider]  acetaminophen (TYLENOL) 500 MG tablet Take 1,000 mg by mouth as needed for mild pain.    [provider]  Glucose Blood (FREESTYLE LITE TEST VI)  12/08/12   [provider]  silver sulfADIAZINE (SILVADENE) 1 % cream Apply topically. Patient not taking: Reported on 05/03/2022  04/07/22   [provider]  tadalafil (CIALIS) 10 MG tablet TAKE 1 TABLET BY MOUTH DAILY Patient not taking: Reported on 05/03/2022 03/30/22   [place], Blondell Reveal, MD  triamcinolone ointment (KENALOG) 0.1 % Apply topically. Patient not taking: Reported on 05/03/2022 04/07/22   [provider]  warfarin (COUMADIN) 1 MG tablet TAKE 1 TABLET BY MOUTH ONCE WEEKLY IN ADDITION TO 2 MG Patient not taking: Reported on 05/03/2022 03/11/22   [place], Blondell Reveal, MD  warfarin (COUMADIN) 2 MG tablet TAKE ONE TABLET BY MOUTH EVERY EVENING Patient not taking: Reported on 05/03/2022 03/23/22   [place], Blondell Reveal, MD    Current Facility-Administered Medications  Medication Dose Route Frequency Provider Last Rate Last Admin   0.9 %  sodium chloride infusion (Manually program via Guardrails IV Fluids)   Intravenous Once Sherwood Gambler, MD   Held at 05/03/22 0921   0.9 %  sodium chloride infusion (  Manually program via Guardrails IV Fluids)   Intravenous Once Norval Morton, MD       [START ON 05/06/2022] pantoprazole (PROTONIX) injection 40 mg  40 mg Intravenous Q12H Smith, Rondell A, MD       pantoprazole (PROTONIX) injection 40 mg  40 mg Intravenous Once Sherwood Gambler, MD       pantoprozole (PROTONIX) 80 mg /NS 100 mL infusion  8 mg/hr Intravenous Continuous Norval Morton, MD       Current Outpatient Medications  Medication Sig Dispense Refill   allopurinol (ZYLOPRIM) 100 MG tablet TAKE 1 TABLET BY MOUTH TWICE A DAY 120 tablet 0   cefdinir (OMNICEF) 300 MG capsule Take 300 mg by mouth 2 (two) times daily.     DULoxetine (CYMBALTA) 20 MG capsule Take 1 capsule (20 mg total) by mouth daily. 30 capsule 1   famotidine (PEPCID) 20 MG tablet TAKE 1 TABLET BY MOUTH DAILY - KEEP APPOINTMENT FOR FURTHER REFILLS (Patient taking differently: Take 20 mg by mouth daily.) 90 tablet 3   furosemide (LASIX) 20 MG tablet Take 20 mg by mouth every other day.     gabapentin (NEURONTIN) 100 MG capsule Take 1  capsule (100 mg total) by mouth 3 (three) times daily. 90 capsule 0   glipiZIDE (GLUCOTROL XL) 2.5 MG 24 hr tablet TAKE ONE TABLET BY MOUTH DAILY WITH BREAKFAST 90 tablet 1   lidocaine (LIDODERM) 5 % Place 1 patch onto the skin as needed. Remove & Discard patch within 12 hours or as directed by MD     metoprolol tartrate (LOPRESSOR) 50 MG tablet TAKE 1 TABLET BY MOUTH TWICE A DAY 180 tablet 0   oxyCODONE (OXY IR/ROXICODONE) 5 MG immediate release tablet Take 5 mg by mouth 3 (three) times daily as needed.     predniSONE (DELTASONE) 10 MG tablet Take 5 mg by mouth daily with breakfast.     simvastatin (ZOCOR) 20 MG tablet Take 1 tablet (20 mg total) by mouth at bedtime. 90 tablet 0   tamsulosin (FLOMAX) 0.4 MG CAPS capsule Take 0.4 mg by mouth daily.     acetaminophen (TYLENOL) 500 MG tablet Take 1,000 mg by mouth as needed for mild pain.     Glucose Blood (FREESTYLE LITE TEST VI)      silver sulfADIAZINE (SILVADENE) 1 % cream Apply topically. (Patient not taking: Reported on 05/03/2022)     tadalafil (CIALIS) 10 MG tablet TAKE 1 TABLET BY MOUTH DAILY (Patient not taking: Reported on 05/03/2022) 30 tablet 1   triamcinolone ointment (KENALOG) 0.1 % Apply topically. (Patient not taking: Reported on 05/03/2022)     warfarin (COUMADIN) 1 MG tablet TAKE 1 TABLET BY MOUTH ONCE WEEKLY IN ADDITION TO 2 MG (Patient not taking: Reported on 05/03/2022) 5 tablet 1   warfarin (COUMADIN) 2 MG tablet TAKE ONE TABLET BY MOUTH EVERY EVENING (Patient not taking: Reported on 05/03/2022) 30 tablet 1    Allergies as of 05/03/2022 - Review Complete 05/03/2022  Allergen Reaction Noted   Atorvastatin Other (See Comments) 02/05/2010   Lisinopril  10/05/2016    Family History  Problem Relation Age of Onset   Hypertension Mother    Alzheimer's disease Mother    Diabetes Mother    Heart disease Father        cabgx2, respiratory failure, pacemaker   Diabetes Brother    Hyperlipidemia Brother     Social History    Socioeconomic History   Marital status: Married    Spouse  name: Not on file   Number of children: 2   Years of education: Not on file   Highest education level: Not on file  Occupational History   Occupation: retired Biomedical engineer  Tobacco Use   Smoking status: Never   Smokeless tobacco: Never  Vaping Use   Vaping Use: Never used  Substance and Sexual Activity   Alcohol use: Not Currently    Comment: margarita    Drug use: No   Sexual activity: Not on file  Other Topics Concern   Not on file  Social History Narrative   Not on file   Social Determinants of Health   Financial Resource Strain: Not on file  Food Insecurity: Not on file  Transportation Needs: Not on file  Physical Activity: Not on file  Stress: Not on file  Social Connections: Not on file  Intimate Partner Violence: Not on file    Review of Systems: Gen: + Fatigue. Denies fever, sweats or chills. No weight loss.  CV: Denies chest pain, palpitations or edema. Resp: Denies cough, shortness of breath of hemoptysis.  GI: See HPI.  GU : Denies urinary burning, blood in urine, increased urinary frequency or incontinence. MS: Denies joint pain, muscles aches or weakness. Derm: See HPI.   Psych: Denies depression, anxiety, memory loss or confusion. Heme: Denies easy bruising, bleeding. Neuro:  Denies headaches, dizziness or paresthesias. Endo:  + DM type II.  Physical Exam: Vital signs in last 24 hours: Temp:  [97.4 F (36.3 C)-98.8 F (37.1 C)] 97.4 F (36.3 C) (02/18 1017) Pulse Rate:  [87-100] 90 (02/18 1030) Resp:  [16-22] 21 (02/18 1030) BP: (92-116)/(61-79) 102/61 (02/18 1030) SpO2:  [99 %-100 %] 99 % (02/18 1030) Weight:  [96.6 kg] 96.6 kg (02/18 0715)   General: 77 year old chronically ill-appearing male. Head:  Normocephalic and atraumatic. Eyes:  No scleral icterus. Conjunctiva pink. Ears:  Normal auditory acuity. Nose:  No deformity, discharge or lesions. Mouth:  Dentition intact.  No ulcers or lesions.  Neck:  Supple. No lymphadenopathy or thyromegaly.  Lungs: Breath sounds clear throughout. No wheezes, rhonchi or crackles.  Heart: Irregular rhythm, no murmurs. Abdomen: Soft, nondistended.  Positive bowel sounds to all 4 quadrants.  Midline abdominal scar intact.  No palpable mass. Rectal: Deferred. Musculoskeletal:  Symmetrical without gross deformities.  Pulses:  Normal pulses noted. Extremities: Lateral lower extremities with stasis dermatitis, scattered erythematous lesions without exudate. Neurologic:  Alert and  oriented x 4. No focal deficits.  Skin:  Intact without significant lesions or rashes. Psych:  Alert and cooperative. Normal mood and affect.  Intake/Output from previous day: No intake/output data recorded. Intake/Output this shift: Total I/O In: 300 [I.V.:250; IV Piggyback:50] Out: -   Lab Results: Recent Labs    05/03/22 0755  WBC 17.6*  HGB 6.6*  HCT 20.1*  PLT 151   BMET Recent Labs    05/03/22 0755  NA 132*  K 5.6*  CL 105  CO2 18*  GLUCOSE 258*  BUN 139*  CREATININE 2.30*  CALCIUM 8.6*   LFT Recent Labs    05/03/22 0755  PROT 4.7*  ALBUMIN 1.9*  AST 20  ALT 21  ALKPHOS 52  BILITOT 0.2*   PT/INR Recent Labs    05/01/22 0818 05/03/22 0755  LABPROT 33.6* 32.6*  INR 3.4* 3.2*   Hepatitis Panel No results for input(s): "HEPBSAG", "HCVAB", "HEPAIGM", "HEPBIGM" in the last 72 hours.    Studies/Results: DG Chest Portable 1 View  Result Date: 05/03/2022 CLINICAL  DATA:  Weakness, lethargy and confusion. EXAM: PORTABLE CHEST 1 VIEW COMPARISON:  PA and lateral chest 03/18/2022. FINDINGS: The lungs are clear. Mild cardiomegaly. No pneumothorax or pleural fluid. No acute or focal bony IMPRESSION: No acute disease. Electronically Signed   By: Inge Rise M.D.   On: 05/03/2022 08:32    IMPRESSION/PLAN:  77 year old male presented to the ED with weakness and syncope with associated profound anemia and melena.  Hemoglobin 6.6 (Hg 9.4 on 04/30/2022).  Transfused 1 unit of PRBCs, second unit ordered not yet transfused.  On PPI infusion.  Last dose of Warfarin was 04/25/2022.  Hemodynamically stable.  Normal EGD in 2019. -Clear liquid diet -Continue PPI infusion -Check H&H posttransfusion and then every 6 hours x 24 hours -Transfuse to maintain hemoglobin level 7 - 8 -EGD, timing to be determined -Eventual colonoscopy -Await further recommendations per Dr. Candis Schatz  Atrial fibrillation -Continue to hold Warfarin  Lateral lower extremity cellulitis/wound -Wound care consult requested by the hospitalist  AKI on CKD  Leukocytosis, likely due to lower extremity cellulitis/wounds     Noralyn Pick  05/03/2022, 12:40PM

## 2022-05-03 NOTE — ED Notes (Signed)
ED TO INPATIENT HANDOFF REPORT  ED Nurse Name and Phone #: Dayn Barich, RN [phone]  S Name/Age/Gender [name] 77 y.o. male Room/Bed: 010C/010C  Code Status   Code Status: Full Code  Home/SNF/Other Home Patient oriented to: self, situation, place, time Is this baseline? Yes   Triage Complete: Triage complete  Chief Complaint Symptomatic anemia [D64.9]  Triage Note Pt BIBA from home. Pt was recently d/c from [facility] for a leg infection. Pt is hot to the touch today, with increased lethargy and confusion. Pt taking cefdinir at home for leg infection. Pt had 2 syncopal episodes today, not responding for approx 1 min with fmaily and EMS. No fall either time, did not hit head, was sitting.  Pt has been unstable on his feet since d/c  Pt c/o constipation and generalized abd pain.    114/68 80 HR, afib 279 CBG 99.9 temp  650 tylenol 250cc Normal saline    Allergies Allergies  Allergen Reactions   Atorvastatin Other (See Comments)    myalgia   Lisinopril     Other reaction(s): Hyperkalemia, Renal impairment    Level of Care/Admitting Diagnosis ED Disposition     ED Disposition  Admit   Condition  --   Comment  Hospital Area: [facility] [[id number]]  Level of Care: Progressive [102]  Admit to Progressive based on following criteria: MULTISYSTEM THREATS such as stable sepsis, metabolic/electrolyte imbalance with or without encephalopathy that is responding to early treatment.  May admit patient to [facility] or [facility] if equivalent level of care is available:: No  Covid Evaluation: Asymptomatic - no recent exposure (last 10 days) testing not required  Diagnosis: Symptomatic anemia [id number]  Admitting Physician: Norval Morton [id number]  Attending Physician: Norval Morton [id number]  Certification:: I certify this patient will need inpatient services for at least 2 midnights  Estimated Length of Stay: 2          B Medical/Surgery  History Past Medical History:  Diagnosis Date   Atrial fibrillation ([place])    Chronic anticoagulation    Complication of anesthesia    ' hard time waking up" per patient    Diabetes mellitus    type 2   GERD (gastroesophageal reflux disease)    Hyperlipidemia    Hypertension    Mitral regurgitation    a. 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[place])    chronic atrial fib   Pulmonary hypertension ([place])    Pulmonary hypertension ([place])    Past Surgical History:  Procedure Laterality Date   CARDIOVERSION     CHOLECYSTECTOMY N/A 03/01/2015   Procedure: LAPAROSCOPIC CHOLECYSTECTOMY;  Surgeon: Ralene Ok, MD;  Location: [facility];  Service: General;  Laterality: N/A;   spur on heel       A IV Location/Drains/Wounds Patient Lines/Drains/Airways Status     Active Line/Drains/Airways     Name Placement date Placement time Site Days   Peripheral IV 05/03/22 18 G Left Hand 05/03/22  --  Hand  less than 1   Peripheral IV 05/03/22 20 G Anterior;Left Forearm 05/03/22  0926  Forearm  less than 1   Incision (Closed) 03/01/15 Abdomen 03/01/15  0816  -- 2620   Incision - 3 Ports Abdomen Right;Medial Upper;Mid Umbilicus [id number]  0000000  -- 2620            Intake/Output Last 24 hours  Intake/Output Summary (Last 24  hours) at 05/03/2022 1251 Last data filed at 05/03/2022 1025 Gross per 24 hour  Intake 300 ml  Output --  Net 300 ml    Labs/Imaging Results for orders placed or performed during the hospital encounter of 05/03/22 (from the past 48 hour(s))  Urinalysis, w/ Reflex to Culture (Infection Suspected) -Urine, Clean Catch     Status: Abnormal   Collection Time: 05/03/22  7:45 AM  Result Value Ref Range   Specimen Source URINE, CLEAN CATCH    Color, Urine YELLOW YELLOW   APPearance CLEAR CLEAR   Specific Gravity, Urine 1.015 1.005 - 1.030   pH 5.5 5.0 - 8.0   Glucose, UA NEGATIVE NEGATIVE mg/dL   Hgb  urine dipstick SMALL (A) NEGATIVE   Bilirubin Urine NEGATIVE NEGATIVE   Ketones, ur NEGATIVE NEGATIVE mg/dL   Protein, ur NEGATIVE NEGATIVE mg/dL   Nitrite NEGATIVE NEGATIVE   Leukocytes,Ua NEGATIVE NEGATIVE   Squamous Epithelial / HPF 0-5 0 - 5 /HPF   WBC, UA 0-5 0 - 5 WBC/hpf    Comment: Reflex urine culture not performed if WBC <=10, OR if Squamous epithelial cells >5. If Squamous epithelial cells >5, suggest recollection.   RBC / HPF 6-10 0 - 5 RBC/hpf   Bacteria, UA RARE (A) NONE SEEN   Mucus PRESENT    Hyaline Casts, UA PRESENT     Comment: Performed at [facility], [place] [address], [place], [place] [address]  Comprehensive metabolic panel     Status: Abnormal   Collection Time: 05/03/22  7:55 AM  Result Value Ref Range   Sodium 132 (L) 135 - 145 mmol/L   Potassium 5.6 (H) 3.5 - 5.1 mmol/L   Chloride 105 98 - 111 mmol/L   CO2 18 (L) 22 - 32 mmol/L   Glucose, Bld 258 (H) 70 - 99 mg/dL    Comment: Glucose reference range applies only to samples taken after fasting for at least 8 hours.   BUN 139 (H) 8 - 23 mg/dL   Creatinine, Ser 2.30 (H) 0.61 - 1.24 mg/dL   Calcium 8.6 (L) 8.9 - 10.3 mg/dL   Total Protein 4.7 (L) 6.5 - 8.1 g/dL   Albumin 1.9 (L) 3.5 - 5.0 g/dL   AST 20 15 - 41 U/L   ALT 21 0 - 44 U/L   Alkaline Phosphatase 52 38 - 126 U/L   Total Bilirubin 0.2 (L) 0.3 - 1.2 mg/dL   GFR, Estimated 29 (L) >60 mL/min    Comment: (NOTE) Calculated using the CKD-EPI Creatinine Equation (2021)    Anion gap 9 5 - 15    Comment: Performed at [facility], [place] [address] [place], [place] [address]  CBC with Differential     Status: Abnormal   Collection Time: 05/03/22  7:55 AM  Result Value Ref Range   WBC 17.6 (H) 4.0 - 10.5 K/uL   RBC 2.04 (L) 4.22 - 5.81 MIL/uL   Hemoglobin 6.6 (LL) 13.0 - 17.0 g/dL    Comment: This critical result has verified and been called to [place] by Ronnell Freshwater on 02 18 2024 at [phone], and has been read back.    HCT 20.1 (L) 39.0 -  52.0 %   MCV 98.5 80.0 - 100.0 fL   MCH 32.4 26.0 - 34.0 pg   MCHC 32.8 30.0 - 36.0 g/dL   RDW 18.6 (H) 11.5 - 15.5 %   Platelets 151 150 - 400 K/uL   nRBC 0.0  0.0 - 0.2 %   Neutrophils Relative % 87 %   Neutro Abs 15.3 (H) 1.7 - 7.7 K/uL   Lymphocytes Relative 4 %   Lymphs Abs 0.8 0.7 - 4.0 K/uL   Monocytes Relative 7 %   Monocytes Absolute 1.2 (H) 0.1 - 1.0 K/uL   Eosinophils Relative 0 %   Eosinophils Absolute 0.0 0.0 - 0.5 K/uL   Basophils Relative 0 %   Basophils Absolute 0.0 0.0 - 0.1 K/uL   Immature Granulocytes 2 %   Abs Immature Granulocytes 0.30 (H) 0.00 - 0.07 K/uL    Comment: Performed at [facility] [address], [place], [place] [address]  Protime-INR     Status: Abnormal   Collection Time: 05/03/22  7:55 AM  Result Value Ref Range   Prothrombin Time 32.6 (H) 11.4 - 15.2 seconds   INR 3.2 (H) 0.8 - 1.2    Comment: (NOTE) INR goal varies based on device and disease states. Performed at [facility], [place] [address] [place], [place] [address]   ABO/Rh     Status: None   Collection Time: 05/03/22  7:55 AM  Result Value Ref Range   ABO/RH(D)      A POS Performed at [facility] [address] [place], [place] [address]   POC occult blood, ED Provider will collect     Status: Abnormal   Collection Time: 05/03/22  8:19 AM  Result Value Ref Range   Fecal Occult Bld POSITIVE (A) NEGATIVE  Type and screen [place]     Status: None (Preliminary result)   Collection Time: 05/03/22  8:29 AM  Result Value Ref Range   ABO/RH(D) A POS    Antibody Screen NEG    Sample Expiration      05/06/2022,2359 Performed at [facility], [place] [address] [place], [place] [address]    Unit Number [id number]    Blood Component Type RBC LR PHER1    Unit division 00    Status of Unit ISSUED    Transfusion Status OK TO TRANSFUSE    Crossmatch Result Compatible    Unit Number [id number]    Blood Component Type RED CELLS,LR     Unit division 00    Status of Unit ALLOCATED    Transfusion Status OK TO TRANSFUSE    Crossmatch Result Compatible   Prepare RBC (crossmatch)     Status: None   Collection Time: 05/03/22  8:53 AM  Result Value Ref Range   Order Confirmation      ORDER PROCESSED BY BLOOD BANK Performed at [facility], [place] [address], [place], [place] [address]   Prepare RBC (crossmatch)     Status: None   Collection Time: 05/03/22 10:33 AM  Result Value Ref Range   Order Confirmation      ORDER PROCESSED BY BLOOD BANK Performed at [facility], [place] [address] [place], [place] [address]    DG Chest Portable 1 View  Result Date: 05/03/2022 CLINICAL DATA:  Weakness, lethargy and confusion. EXAM: PORTABLE CHEST 1 VIEW COMPARISON:  PA and lateral chest 03/18/2022. FINDINGS: The lungs are clear. Mild cardiomegaly. No pneumothorax or pleural fluid. No acute or focal bony IMPRESSION: No acute disease. Electronically Signed   By: Inge Rise M.D.   On: 05/03/2022 08:32    Pending Labs [employer] (From admission, onward)     Start     Ordered  05/04/22 0500  CBC  Tomorrow morning,   R        05/03/22 1042   05/04/22 [id number]  Basic metabolic panel  Tomorrow morning,   R        05/03/22 1042   05/04/22 0500  Magnesium  Tomorrow morning,   R        05/03/22 1048   05/04/22 0500  Protime-INR  Daily,   R      05/03/22 1048   05/03/22 1800  Hemoglobin and hematocrit, blood  Once,   R        05/03/22 1046            Vitals/Pain Today's Vitals   05/03/22 1017 05/03/22 1030 05/03/22 1100 05/03/22 1230  BP: 116/73 102/61  (!) 101/52  Pulse: 99 90  94  Resp: 16 (!) 21  (!) 21  Temp: (!) 97.4 F (36.3 C)   97.6 F (36.4 C)  TempSrc: Oral   Axillary  SpO2: 100% 99%  100%  Weight:      Height:      PainSc:   9      Isolation Precautions No active isolations  Medications Medications  0.9 %  sodium chloride infusion (Manually program via Guardrails IV Fluids) (0 mLs  Intravenous Hold 05/03/22 0921)  pantoprozole (PROTONIX) 80 mg /NS 100 mL infusion (8 mg/hr Intravenous New Bag/Given 05/03/22 1249)  pantoprazole (PROTONIX) injection 40 mg (has no administration in time range)  0.9 %  sodium chloride infusion (Manually program via Guardrails IV Fluids) (0 mLs Intravenous Hold 05/03/22 1051)  oxyCODONE (Oxy IR/ROXICODONE) immediate release tablet 5 mg (has no administration in time range)  DULoxetine (CYMBALTA) DR capsule 20 mg (20 mg Oral Given 05/03/22 1234)  tamsulosin (FLOMAX) capsule 0.4 mg (0.4 mg Oral Given 05/03/22 1234)  gabapentin (NEURONTIN) capsule 100 mg (100 mg Oral Given 05/03/22 1234)  sodium chloride flush (NS) 0.9 % injection 3 mL (3 mLs Intravenous Not Given 05/03/22 1051)  acetaminophen (TYLENOL) tablet 650 mg (has no administration in time range)    Or  acetaminophen (TYLENOL) suppository 650 mg (has no administration in time range)  albuterol (PROVENTIL) (2.5 MG/3ML) 0.083% nebulizer solution 2.5 mg (has no administration in time range)  lidocaine (LIDODERM) 5 % 1 patch (has no administration in time range)  insulin aspart (novoLOG) injection 0-6 Units (has no administration in time range)  insulin aspart (novoLOG) injection 0-5 Units (has no administration in time range)  lactated ringers bolus 1,000 mL (0 mLs Intravenous Stopped 05/03/22 0922)  lidocaine (XYLOCAINE) 2 % jelly 1 Application (1 Application Topical Given 05/03/22 0827)  fentaNYL (SUBLIMAZE) injection 50 mcg (50 mcg Intravenous Given 05/03/22 0826)  pantoprazole (PROTONIX) injection 40 mg (40 mg Intravenous Given 05/03/22 0826)  phytonadione (VITAMIN K) 5 mg in dextrose 5 % 50 mL IVPB (0 mg Intravenous Stopped 05/03/22 1020)  oxyCODONE-acetaminophen (PERCOCET/ROXICET) 5-325 MG per tablet 1 tablet (1 tablet Oral Given 05/03/22 1028)  pantoprazole (PROTONIX) injection 40 mg (40 mg Intravenous Given 05/03/22 1244)    Mobility walks with device     Focused  Assessments   R Recommendations: See Admitting Provider Note  Report given to:   Additional Notes: Patient just completed first unit of blood, currently receiving protonix infusion. He has two lines, he swallows pills fine with water. NPO for now, another unit of blood ordered, daughter at bedside who is very helpful.

## 2022-05-04 ENCOUNTER — Telehealth (HOSPITAL_BASED_OUTPATIENT_CLINIC_OR_DEPARTMENT_OTHER): Payer: Self-pay

## 2022-05-04 ENCOUNTER — Inpatient Hospital Stay (HOSPITAL_COMMUNITY): Payer: Medicare Other | Admitting: Certified Registered Nurse Anesthetist

## 2022-05-04 ENCOUNTER — Encounter (HOSPITAL_COMMUNITY): Payer: Self-pay | Admitting: Internal Medicine

## 2022-05-04 ENCOUNTER — Encounter (HOSPITAL_COMMUNITY)
Admission: EM | Disposition: A | Discharge status: Home Health | Payer: Self-pay | Source: Home / Self Care | Attending: Internal Medicine

## 2022-05-04 DIAGNOSIS — K317 Polyp of stomach and duodenum: Secondary | ICD-10-CM | POA: Diagnosis not present

## 2022-05-04 DIAGNOSIS — K269 Duodenal ulcer, unspecified as acute or chronic, without hemorrhage or perforation: Secondary | ICD-10-CM

## 2022-05-04 DIAGNOSIS — N179 Acute kidney failure, unspecified: Secondary | ICD-10-CM

## 2022-05-04 DIAGNOSIS — I872 Venous insufficiency (chronic) (peripheral): Secondary | ICD-10-CM | POA: Diagnosis not present

## 2022-05-04 DIAGNOSIS — N189 Chronic kidney disease, unspecified: Secondary | ICD-10-CM

## 2022-05-04 DIAGNOSIS — E1122 Type 2 diabetes mellitus with diabetic chronic kidney disease: Secondary | ICD-10-CM

## 2022-05-04 DIAGNOSIS — K922 Gastrointestinal hemorrhage, unspecified: Secondary | ICD-10-CM | POA: Diagnosis not present

## 2022-05-04 DIAGNOSIS — I10 Essential (primary) hypertension: Secondary | ICD-10-CM | POA: Diagnosis not present

## 2022-05-04 DIAGNOSIS — D649 Anemia, unspecified: Secondary | ICD-10-CM

## 2022-05-04 DIAGNOSIS — D631 Anemia in chronic kidney disease: Secondary | ICD-10-CM

## 2022-05-04 DIAGNOSIS — I129 Hypertensive chronic kidney disease with stage 1 through stage 4 chronic kidney disease, or unspecified chronic kidney disease: Secondary | ICD-10-CM

## 2022-05-04 HISTORY — PX: ESOPHAGOGASTRODUODENOSCOPY (EGD) WITH PROPOFOL: SHX5813

## 2022-05-04 HISTORY — PX: BIOPSY: SHX5522

## 2022-05-04 LAB — GLUCOSE, CAPILLARY
Glucose-Capillary: 159 mg/dL — ABNORMAL HIGH (ref 70–99)
Glucose-Capillary: 145 mg/dL — ABNORMAL HIGH (ref 70–99)
Glucose-Capillary: 191 mg/dL — ABNORMAL HIGH (ref 70–99)

## 2022-05-04 LAB — CBC
HCT: 21.9 % — ABNORMAL LOW (ref 39.0–52.0)
HCT: 19.8 % — ABNORMAL LOW (ref 39.0–52.0)
HCT: 23 % — ABNORMAL LOW (ref 39.0–52.0)
Hemoglobin: 7.2 g/dL — ABNORMAL LOW (ref 13.0–17.0)
Hemoglobin: 6.8 g/dL — CL (ref 13.0–17.0)
Hemoglobin: 7.8 g/dL — ABNORMAL LOW (ref 13.0–17.0)
MCH: 31.4 pg (ref 26.0–34.0)
MCH: 32.4 pg (ref 26.0–34.0)
MCH: 31.3 pg (ref 26.0–34.0)
MCHC: 32.9 g/dL (ref 30.0–36.0)
MCHC: 34.3 g/dL (ref 30.0–36.0)
MCHC: 33.9 g/dL (ref 30.0–36.0)
MCV: 95.6 fL (ref 80.0–100.0)
MCV: 94.3 fL (ref 80.0–100.0)
MCV: 92.4 fL (ref 80.0–100.0)
Platelets: 131 10*3/uL — ABNORMAL LOW (ref 150–400)
Platelets: 127 10*3/uL — ABNORMAL LOW (ref 150–400)
Platelets: 119 10*3/uL — ABNORMAL LOW (ref 150–400)
RBC: 2.29 MIL/uL — ABNORMAL LOW (ref 4.22–5.81)
RBC: 2.1 MIL/uL — ABNORMAL LOW (ref 4.22–5.81)
RBC: 2.49 MIL/uL — ABNORMAL LOW (ref 4.22–5.81)
RDW: 18.4 % — ABNORMAL HIGH (ref 11.5–15.5)
RDW: 18.4 % — ABNORMAL HIGH (ref 11.5–15.5)
RDW: 19.1 % — ABNORMAL HIGH (ref 11.5–15.5)
WBC: 16 10*3/uL — ABNORMAL HIGH (ref 4.0–10.5)
WBC: 13.6 10*3/uL — ABNORMAL HIGH (ref 4.0–10.5)
WBC: 12 10*3/uL — ABNORMAL HIGH (ref 4.0–10.5)
nRBC: 0 % (ref 0.0–0.2)
nRBC: 0.1 % (ref 0.0–0.2)
nRBC: 0.2 % (ref 0.0–0.2)

## 2022-05-04 LAB — BASIC METABOLIC PANEL
Anion gap: 7 (ref 5–15)
BUN: 132 mg/dL — ABNORMAL HIGH (ref 8–23)
CO2: 21 mmol/L — ABNORMAL LOW (ref 22–32)
Calcium: 8.5 mg/dL — ABNORMAL LOW (ref 8.9–10.3)
Chloride: 108 mmol/L (ref 98–111)
Creatinine, Ser: 1.96 mg/dL — ABNORMAL HIGH (ref 0.61–1.24)
GFR, Estimated: 35 mL/min — ABNORMAL LOW (ref >60)
Glucose, Bld: 186 mg/dL — ABNORMAL HIGH (ref 70–99)
Potassium: 5.2 mmol/L — ABNORMAL HIGH (ref 3.5–5.1)
Sodium: 136 mmol/L (ref 135–145)

## 2022-05-04 LAB — PREPARE FRESH FROZEN PLASMA: Unit division: 0

## 2022-05-04 LAB — CBC WITH DIFFERENTIAL/PLATELET
Abs Immature Granulocytes: 0.3 10*3/uL — ABNORMAL HIGH (ref 0.00–0.07)
Basophils Absolute: 0 10*3/uL (ref 0.0–0.1)
Basophils Relative: 0 %
Eosinophils Absolute: 0 10*3/uL (ref 0.0–0.5)
Eosinophils Relative: 0 %
HCT: 20.1 % — ABNORMAL LOW (ref 39.0–52.0)
Hemoglobin: 6.6 g/dL — CL (ref 13.0–17.0)
Immature Granulocytes: 2 %
Lymphocytes Relative: 4 %
Lymphs Abs: 0.8 10*3/uL (ref 0.7–4.0)
MCH: 32.4 pg (ref 26.0–34.0)
MCHC: 32.8 g/dL (ref 30.0–36.0)
MCV: 98.5 fL (ref 80.0–100.0)
Monocytes Absolute: 1.2 10*3/uL — ABNORMAL HIGH (ref 0.1–1.0)
Monocytes Relative: 7 %
Neutro Abs: 15.3 10*3/uL — ABNORMAL HIGH (ref 1.7–7.7)
Neutrophils Relative %: 87 %
Platelets: 151 10*3/uL (ref 150–400)
RBC: 2.04 MIL/uL — ABNORMAL LOW (ref 4.22–5.81)
RDW: 18.6 % — ABNORMAL HIGH (ref 11.5–15.5)
WBC: 17.6 10*3/uL — ABNORMAL HIGH (ref 4.0–10.5)
nRBC: 0 % (ref 0.0–0.2)

## 2022-05-04 LAB — PROTIME-INR
INR: 1.4 — ABNORMAL HIGH (ref 0.8–1.2)
Prothrombin Time: 16.7 seconds — ABNORMAL HIGH (ref 11.4–15.2)

## 2022-05-04 LAB — BPAM FFP
Blood Product Expiration Date: 202402222359
ISSUE DATE / TIME: 202402181921
Unit Type and Rh: 6200

## 2022-05-04 LAB — PREPARE RBC (CROSSMATCH)

## 2022-05-04 LAB — MAGNESIUM: Magnesium: 2.3 mg/dL (ref 1.7–2.4)

## 2022-05-04 LAB — HEMOGLOBIN AND HEMATOCRIT, BLOOD
HCT: 22.2 % — ABNORMAL LOW (ref 39.0–52.0)
HCT: 26 % — ABNORMAL LOW (ref 39.0–52.0)
Hemoglobin: 7.2 g/dL — ABNORMAL LOW (ref 13.0–17.0)
Hemoglobin: 8.7 g/dL — ABNORMAL LOW (ref 13.0–17.0)

## 2022-05-04 SURGERY — ESOPHAGOGASTRODUODENOSCOPY (EGD) WITH PROPOFOL
Anesthesia: Monitor Anesthesia Care

## 2022-05-04 MED ORDER — DIPHENHYDRAMINE HCL 25 MG PO CAPS
25.0000 mg | ORAL_CAPSULE | Freq: Once | ORAL | Status: AC
  Administered 2022-05-04: 25 mg via ORAL
  Filled 2022-05-04: qty 1

## 2022-05-04 MED ORDER — PHENYLEPHRINE 80 MCG/ML (10ML) SYRINGE FOR IV PUSH (FOR BLOOD PRESSURE SUPPORT)
PREFILLED_SYRINGE | INTRAVENOUS | Status: DC | PRN
  Administered 2022-05-04 (×2): 80 ug via INTRAVENOUS

## 2022-05-04 MED ORDER — ACETAMINOPHEN 325 MG PO TABS
650.0000 mg | ORAL_TABLET | Freq: Once | ORAL | Status: AC
  Administered 2022-05-04: 650 mg via ORAL
  Filled 2022-05-04: qty 2

## 2022-05-04 MED ORDER — SODIUM CHLORIDE 0.9 % IV SOLN: INTRAVENOUS | Status: DC

## 2022-05-04 MED ORDER — SILVER SULFADIAZINE 1 % EX CREA
TOPICAL_CREAM | Freq: Two times a day (BID) | CUTANEOUS | Status: DC
  Filled 2022-05-04 (×2): qty 85

## 2022-05-04 MED ORDER — LIDOCAINE 2% (20 MG/ML) 5 ML SYRINGE
INTRAMUSCULAR | Status: DC | PRN
  Administered 2022-05-04: 80 mg via INTRAVENOUS
  Administered 2022-05-04: 20 mg via INTRAVENOUS

## 2022-05-04 MED ORDER — PROPOFOL 500 MG/50ML IV EMUL
INTRAVENOUS | Status: DC | PRN
  Administered 2022-05-04: 100 ug/kg/min via INTRAVENOUS

## 2022-05-04 MED ORDER — PROPOFOL 10 MG/ML IV BOLUS
INTRAVENOUS | Status: DC | PRN
  Administered 2022-05-04 (×4): 10 mg via INTRAVENOUS

## 2022-05-04 MED ORDER — FUROSEMIDE 10 MG/ML IJ SOLN
20.0000 mg | Freq: Once | INTRAMUSCULAR | Status: AC
  Administered 2022-05-04: 20 mg via INTRAVENOUS
  Filled 2022-05-04: qty 2

## 2022-05-04 MED ORDER — SODIUM CHLORIDE 0.9% IV SOLUTION: Freq: Once | INTRAVENOUS | Status: AC

## 2022-05-04 MED ORDER — SODIUM ZIRCONIUM CYCLOSILICATE 10 G PO PACK
10.0000 g | PACK | Freq: Every day | ORAL | Status: AC
  Administered 2022-05-04: 10 g via ORAL
  Filled 2022-05-04: qty 1

## 2022-05-04 SURGICAL SUPPLY — 15 items

## 2022-05-04 NOTE — Transitions of Care (Post Inpatient/ED Visit) (Unsigned)
   05/04/2022  Name: [name] MRN: [id number] DOB: 05/24/1945  Today's TOC FU Call Status: Today's TOC FU Call Status:: Unsuccessul Call (1st Attempt) Unsuccessful Call (1st Attempt) Date: 05/04/22  Attempted to reach the patient regarding the most recent Inpatient/ED visit.  Follow Up Plan: Additional outreach attempts will be made to reach the patient to complete the Transitions of Care (Post Inpatient/ED visit) call.   [place] LPN [place] Advisor Direct Dial [phone]

## 2022-05-04 NOTE — Interval H&P Note (Signed)
History and Physical Interval Note:  05/04/2022 1:39 PM  [name]  has presented today for surgery, with the diagnosis of anemia, hemoccult positive stool.  The various methods of treatment have been discussed with the patient and family. After consideration of risks, benefits and other options for treatment, the patient has consented to  Procedure(s): ESOPHAGOGASTRODUODENOSCOPY (EGD) WITH PROPOFOL (N/A) as a surgical intervention.  The patient's history has been reviewed, patient examined, no change in status, stable for surgery.  I have reviewed the patient's chart and labs.  Questions were answered to the patient's satisfaction.     Thornton Park

## 2022-05-04 NOTE — Transfer of Care (Signed)
Immediate Anesthesia Transfer of Care Note  [name]  Procedure(s) Performed: ESOPHAGOGASTRODUODENOSCOPY (EGD) WITH PROPOFOL BIOPSY  Patient Location: Endoscopy Unit  Anesthesia Type:MAC  Level of Consciousness: drowsy  Airway & Oxygen Therapy: Patient Spontanous Breathing  Post-op Assessment: Report given to RN and Post -op Vital signs reviewed and stable  Post vital signs: Reviewed and stable  Last Vitals: SEE endo vitals Vitals Value Taken Time  BP    Temp    Pulse 108 05/04/22 1407  Resp 17 05/04/22 1407  SpO2 94 % 05/04/22 1407  Vitals shown include unvalidated device data.  Last Pain:  Vitals:   05/04/22 1314  TempSrc: Temporal  PainSc: 6          Complications: No notable events documented.

## 2022-05-04 NOTE — Op Note (Addendum)
[facility] Patient Name: [name] Procedure Date : 05/04/2022 MRN: [id number] Attending MD: Thornton Park MD, MD, [id number] Date of Birth: 01-Nov-1945 CSN: [id number] Age: 77 Admit Type: Inpatient Procedure:                Upper GI endoscopy Indications:              Suspected upper gastrointestinal bleeding in the                            setting of warfarin Providers:                Thornton Park MD, MD, Jamison Neighbor RN, RN,                            Benetta Spar, Technician Referring MD:              Medicines:                Monitored Anesthesia Care Complications:            No immediate complications. Estimated Blood Loss:     Estimated blood loss was minimal. Procedure:                Pre-Anesthesia Assessment:                           - Prior to the procedure, a History and Physical                            was performed, and patient medications and                            allergies were reviewed. The patient's tolerance of                            previous anesthesia was also reviewed. The risks                            and benefits of the procedure and the sedation                            options and risks were discussed with the patient.                            All questions were answered, and informed consent                            was obtained. Prior Anticoagulants: The patient has                            taken Coumadin (warfarin), last dose was 9 days                            prior to procedure. ASA Grade Assessment: III - A  patient with severe systemic disease. After                            reviewing the risks and benefits, the patient was                            deemed in satisfactory condition to undergo the                            procedure.                           After obtaining informed consent, the endoscope was                            passed under direct vision.  Throughout the                            procedure, the patient's blood pressure, pulse, and                            oxygen saturations were monitored continuously. The                            [id number] Olympus endoscope was introduced                            through the mouth, and advanced to the second part                            of duodenum. The upper GI endoscopy was                            accomplished without difficulty. The patient                            tolerated the procedure well. Photographs taken                            during the procedure are now included in the                            medical record. However, some images were taken but                            not captured due to an error with the [employer]. Scope In: Scope Out: Findings:      The esophagus was normal.      A few small sessile polyps with no bleeding and no stigmata of recent       bleeding were found in the gastric fundus.      The entire examined stomach was otherwise normal. Biopsies were taken  from the antrum, body, and fundus with a cold forceps for histology.       Estimated blood loss was minimal.      One non-bleeding cratered duodenal ulcer with a clean ulcer base       (Forrest Class III) was found in the duodenal bulb. The mucosa       surrounding the ulcer is slightly heaped and the margins of the ulcer       are somewhat serpiginous. The lesion was 8 mm in largest dimension where       deeply cratered.      The cardia and gastric fundus were normal on retroflexion.      The exam was otherwise without abnormality. Impression:               - Normal esophagus.                           - A few gastric polyps.                           - Normal stomach. Biopsied for H pylori.                           - Non-bleeding duodenal ulcer.                           - The examination was otherwise normal. Recommendation:            - Return patient to hospital ward for ongoing care.                           - Advance to clear liquid diet today with                            additional advance if hemoglobin stabilizes.                           - Use Protonix (pantoprazole) 40 mg PO BID for 8                            weeks.                           - Hold warfarin and other anticoagulations for at                            least 24-48 hours.                           - Avoid all NDAIDs.                           - Follow serial hgb/hct with tranfusion as                            indicated.                           -  Repeat EGD in 6-8 weeks to document healing.                           - Continue serial hgb/hct with transfusion as                            indicated.                           - The findings and recommendations were discussed                            with the patient and their spouse in the endoscopy                            unit prior to return to the hospital ward. The                            patient was given a copy of the procedure note for                            his records. Procedure Code(s):        --- Professional ---                           [phone], Esophagogastroduodenoscopy, flexible,                            transoral; with biopsy, single or multiple Diagnosis Code(s):        --- Professional ---                           K31.7, Polyp of stomach and duodenum                           K26.9, Duodenal ulcer, unspecified as acute or                            chronic, without hemorrhage or perforation CPT copyright 2022 American Medical Association. All rights reserved. The codes documented in this report are preliminary and upon coder review may  be revised to meet current compliance requirements. Thornton Park MD, MD 05/04/2022 2:23:10 PM This report has been signed electronically. Number of Addenda: 0

## 2022-05-04 NOTE — Anesthesia Procedure Notes (Signed)
Procedure Name: MAC Date/Time: 05/04/2022 1:45 PM  Performed by: Dorthea Cove, CRNAPre-anesthesia Checklist: Patient identified, Emergency Drugs available, Suction available, Patient being monitored and Timeout performed Patient Re-evaluated:Patient Re-evaluated prior to induction Oxygen Delivery Method: Nasal cannula Preoxygenation: Pre-oxygenation with 100% oxygen Induction Type: IV induction Placement Confirmation: positive ETCO2 and CO2 detector

## 2022-05-04 NOTE — Consult Note (Signed)
[facility] Nurse Consult Note:this patient is followed by dermatology at [facility] and has confirmed diagnosis of leukocytoclastic vasculitis, per their notes using Silvadene cream  Reason for Consult:B leg, buttocks wounds  Wound type: Vasculitic  Pressure Injury POA:  NA, not related to pressure  Measurement: 1.  R buttock full thickness tissue loss 2.5 cms x 2 cms x 0.1 cms 100% yellow necrotic tissue  2.  L buttock full thickness tissue loss 2 cms x 0.5 cms x 0.1 cms 100% yellow necrotic tissue  3.  L leg with multiple areas of full thickness tissue loss, most covered with 100% yellow necrotic tissue; largest area 13 cms x 5 cms x 0.2 cms on left posterior leg with 100% tan loose necrotic tissue 4.  R leg with multiple areas of full thickness tissue loss largest 7 cms x 2 cms x 0.1 cms 100% yellow necrotic tissue   Drainage (amount, consistency, odor) Buttocks with scant yellow serous drainage  Legs with minimal serous drainage on all dressings except for L posterior leg moderate amount of tan somewhat foul smelling drainage noted on dressing  Periwound: B legs with scattered purpuric lesions, scaling skin  Dressing procedure/placement/frequency:  R buttock clean with NS, apply Silvadene cream to wound bed twice daily, cover with gauze and secure with foam.  L buttock clean with NS, apply Silvadene cream to wound bed twice daily, cover with gauze and secure with foam.  L leg cover open areas with Silvadene cream twice daily, cover with ABD pads, wrap with kerlix from above toes to below knee and secure with 4" ACE wrap from above toes to below knee.  R leg cover open areas with Silvadene cream twice daily, cover with ABD pads, wrap with kerlix from above toes to below knee and secure with 4" Ace wrap from above toes to below knee.   POC discussed with MD, bedside nurse and patient.  WOC will not follow at this time.  Re-consult if further needs arise.   Thank you,     Liliya Fullenwider  MSN, RN-BC, [employer]

## 2022-05-04 NOTE — Anesthesia Preprocedure Evaluation (Addendum)
Anesthesia Evaluation  Patient identified by MRN, date of birth, ID band Patient awake    Reviewed: Allergy & Precautions, NPO status , Patient's Chart, lab work & pertinent test results  History of Anesthesia Complications (+) history of anesthetic complications (hard time waking up)  Airway Mallampati: III  TM Distance: >3 FB Neck ROM: Full    Dental  (+) Dental Advisory Given   Pulmonary neg shortness of breath, sleep apnea (does not use CPAP) , neg COPD, neg recent URI   Pulmonary exam normal breath sounds clear to auscultation       Cardiovascular hypertension, pulmonary hypertension(-) angina (-) Past MI and (-) Cardiac Stents + dysrhythmias Atrial Fibrillation + Valvular Problems/Murmurs (TR) MR  Rhythm:Regular Rate:Normal  HLD  TTE 05/31/2020: IMPRESSIONS     1. Left ventricular ejection fraction, by estimation, is 60 to 65%. The  left ventricle has normal function. The left ventricle has no regional  wall motion abnormalities. There is mild left ventricular hypertrophy.  Left ventricular diastolic function  could not be evaluated.   2. Right ventricular systolic function is normal. The right ventricular  size is normal. There is severely elevated pulmonary artery systolic  pressure. The estimated right ventricular systolic pressure is [id number] mmHg.   3. Left atrial size was severely dilated.   4. Right atrial size was moderately dilated.   5. The mitral valve is abnormal. Moderate to severe mitral valve  regurgitation.   6. Eccentric TR jet. The tricuspid valve is abnormal. Tricuspid valve  regurgitation is moderate.   7. The aortic valve is tricuspid. Aortic valve regurgitation is trivial.   8. The inferior vena cava is dilated in size with <50% respiratory  variability, suggesting right atrial pressure of 15 mmHg.     Neuro/Psych neg Seizures Spinal stenosis    GI/Hepatic Neg liver ROS,GERD  ,,  Endo/Other   diabetes, Type 2    Renal/GU CRFRenal disease     Musculoskeletal   Abdominal   Peds  Hematology  (+) Blood dyscrasia, anemia   Anesthesia Other Findings 77 y.o.  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  Hgb 6.8 this morning. Received 1 unit pRBCs.  K 5.2  Reproductive/Obstetrics                             Anesthesia Physical Anesthesia Plan  ASA: 4  Anesthesia Plan: MAC   Post-op Pain Management:    Induction: Intravenous  PONV Risk Score and Plan: 1 and Propofol infusion and Treatment may vary due to age or medical condition  Airway Management Planned: Natural Airway and Nasal Cannula  Additional Equipment:   Intra-op Plan:   Post-operative Plan:   Informed Consent: I have reviewed the patients History and Physical, chart, labs and discussed the procedure including the risks, benefits and alternatives for the proposed anesthesia with the patient or authorized representative who has indicated his/her understanding and acceptance.     Dental advisory given  Plan Discussed with: CRNA and Anesthesiologist  Anesthesia Plan Comments: (Discussed with patient risks of MAC including, but not limited to, minor pain or discomfort, hearing people in the room, and possible need for backup general anesthesia. Risks for general anesthesia also discussed including, but not limited to, sore throat, hoarse voice, chipped/damaged teeth, injury to vocal cords, nausea and vomiting, allergic reactions, lung  infection, heart attack, stroke, and death. All questions answered. )       Anesthesia Quick Evaluation

## 2022-05-04 NOTE — Anesthesia Postprocedure Evaluation (Signed)
Anesthesia Post Note  [name]  Procedure(s) Performed: ESOPHAGOGASTRODUODENOSCOPY (EGD) WITH PROPOFOL BIOPSY     Patient location during evaluation: PACU Anesthesia Type: MAC Level of consciousness: awake Pain management: pain level controlled Vital Signs Assessment: post-procedure vital signs reviewed and stable Respiratory status: spontaneous breathing, nonlabored ventilation and respiratory function stable Cardiovascular status: stable and blood pressure returned to baseline Postop Assessment: no apparent nausea or vomiting Anesthetic complications: no   No notable events documented.  Last Vitals:  Vitals:   05/04/22 1314 05/04/22 1409  BP: 115/62 99/60  Pulse: (!) 103 (!) 103  Resp: 17 18  Temp: (!) 36.1 C 36.8 C  SpO2: 97% 98%    Last Pain:  Vitals:   05/04/22 1409  TempSrc: Temporal  PainSc: 6                  Nilda Simmer

## 2022-05-04 NOTE — Progress Notes (Signed)
PROGRESS NOTE        PATIENT DETAILS Name: [name] Age: 77 y.o. Sex: male Date of Birth: October 16, 1945 Admit Date: 05/03/2022 Admitting Physician Norval Morton, MD PCP:de [place], Raymond J, MD  Brief Summary: Patient is a 77 y.o.  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  Significant events: 2/18>> admit to [facility]  Significant studies: 2/18>> CXR: No PNA  Significant microbiology data: None  Procedures: None  Consults: GI  Subjective: Several melanotic stools yesterday evening and 1 this morning.  Objective: Vitals: Blood pressure 91/63, pulse (!) 121, temperature (!) 97.5 F (36.4 C), temperature source Oral, resp. rate 18, height 6' 3"$  (1.905 m), weight 96.6 kg, SpO2 90 %.   Exam: Gen Exam:Alert awake-not in any distress.  Frail appearing. HEENT:atraumatic, normocephalic Chest: B/L clear to auscultation anteriorly CVS:S1S2 regular Abdomen:soft non tender, non distended Extremities: Chronic appearing lower extremity wounds-dressing in place. Neurology: Non focal Skin: no rash  Pertinent Labs/Radiology:    Latest Ref Rng & Units 05/04/2022    7:18 AM 05/04/2022    4:20 AM 05/04/2022   12:46 AM  CBC  WBC 4.0 - 10.5 K/uL 13.6   16.0   Hemoglobin 13.0 - 17.0 g/dL 6.8  7.2  7.2   Hematocrit 39.0 - 52.0 % 19.8  22.2  21.9   Platelets 150 - 400 K/uL 127   131     Lab Results  Component Value Date   NA 136 05/04/2022   K 5.2 (H) 05/04/2022   CL 108 05/04/2022   CO2 21 (L) 05/04/2022      Assessment/Plan: Upper GI bleeding with acute blood loss anemia Continues to have melanotic stools Hemoglobin drifting down-will order additional 2 units of PRBC (received 2 units yesterday) Continue PPI infusion GI following-will defer timing of endoscopy to GI team. Follow  CBC  Syncope Suspect either vasovagal or orthostatic-in the setting of GI bleeding Telemetry showing A-fib but no major arrhythmias Check echo Continue telemetry monitoring When able-check orthostatic vital signs  AKI on CKD stage IIIb AKI due to GI bleeding/hemodynamically mediated Improved with supportive care Continue to follow electrolytes closely Avoid nephrotoxic agents  Hyperkalemia Lokelma x 1 Repeat electrolytes tomorrow  Chronic atrial fibrillation Rate controlled On Coumadin prior to this hospitalization-with plans to transition to Eliquis when INR was subtherapeutic. He has not been on Coumadin since 2/10-INR was still 3.2 on presentation-likely due to interaction with antibiotic INR now down to 1.4 following vitamin K administration yesterday.  HTN Allow permissive hypertension in the setting of active GI bleeding Resume antihypertensives when able.  DM-2 (A1c 6.7 on 1/9) CBGs stable with SSI Continue to hold all oral hypoglycemics.   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   Weight as of this encounter: 96.6 kg.   Code status:   Code Status:  Full Code   DVT Prophylaxis: SCDs Start: 05/03/22 1042   Family Communication:Spouse-Fran [phone] -updated 2/19   Disposition Plan: Status is: Inpatient Remains inpatient appropriate because: Severity of illness   Planned Discharge Destination:Home   Diet: Diet Order             Diet NPO time specified Except for:  Sips with Meds  Diet effective midnight                     Antimicrobial agents: Anti-infectives (From admission, onward)    Start     Dose/Rate Route Frequency Ordered Stop   05/03/22 2200  cefdinir (OMNICEF) capsule 300 mg        300 mg Oral 2 times daily 05/03/22 1900          MEDICATIONS: Scheduled Meds:  sodium chloride   Intravenous Once   sodium chloride   Intravenous Once   sodium chloride   Intravenous Once   acetaminophen  650 mg Oral Once   cefdinir  300 mg Oral BID   diphenhydrAMINE  25 mg Oral Once   DULoxetine  20 mg Oral Daily   furosemide  20 mg Intravenous Once   gabapentin  100 mg Oral TID   insulin aspart  0-5 Units Subcutaneous QHS   insulin aspart  0-9 Units Subcutaneous TID WC   [START ON 05/06/2022] pantoprazole  40 mg Intravenous Q12H   predniSONE  5 mg Oral Q breakfast   simvastatin  20 mg Oral QHS   sodium chloride flush  3 mL Intravenous Q12H   sodium zirconium cyclosilicate  10 g Oral Daily   tamsulosin  0.4 mg Oral Daily   Continuous Infusions:  pantoprazole 8 mg/hr (05/04/22 0312)   PRN Meds:.acetaminophen **OR** acetaminophen, albuterol, lidocaine, oxyCODONE   I have personally reviewed following labs and imaging studies  LABORATORY DATA: CBC: Recent Labs  Lab 05/03/22 0755 05/04/22 0046 05/04/22 0420 05/04/22 0718  WBC 17.6* 16.0*  --  13.6*  NEUTROABS 15.3*  --   --   --   HGB 6.6* 7.2* 7.2* 6.8*  HCT 20.1* 21.9* 22.2* 19.8*  MCV 98.5 95.6  --  94.3  PLT 151 131*  --  127*    Basic Metabolic Panel: Recent Labs  Lab 05/03/22 0755 05/04/22 0046  NA 132* 136  K 5.6* 5.2*  CL 105 108  CO2 18* 21*  GLUCOSE 258* 186*  BUN 139* 132*  CREATININE 2.30* 1.96*  CALCIUM 8.6* 8.5*  MG  --  2.3    GFR: Estimated Creatinine Clearance: 38.9 mL/min (A) (by C-G formula based on SCr of 1.96 mg/dL (H)).  Liver Function Tests: Recent Labs  Lab 05/03/22 0755  AST 20  ALT 21  ALKPHOS 52  BILITOT 0.2*  PROT 4.7*   ALBUMIN 1.9*   No results for input(s): "LIPASE", "AMYLASE" in the last 168 hours. No results for input(s): "AMMONIA" in the last 168 hours.  Coagulation Profile: Recent Labs  Lab 05/01/22 0818 05/03/22 0755 05/04/22 0046  INR 3.4* 3.2* 1.4*    Cardiac Enzymes: No results for input(s): "CKTOTAL", "CKMB", "CKMBINDEX", "TROPONINI" in the last 168 hours.  BNP (last 3 results) No results for input(s): "PROBNP" in the last 8760 hours.  Lipid Profile: No results for input(s): "CHOL", "HDL", "LDLCALC", "TRIG", "CHOLHDL", "LDLDIRECT" in the last 72 hours.  Thyroid Function Tests: No results for input(s): "TSH", "T4TOTAL", "FREET4", "T3FREE", "THYROIDAB" in the last 72 hours.  Anemia Panel: No results for  input(s): "VITAMINB12", "FOLATE", "FERRITIN", "TIBC", "IRON", "RETICCTPCT" in the last 72 hours.  Urine analysis:    Component Value Date/Time   COLORURINE YELLOW 05/03/2022 0745   APPEARANCEUR CLEAR 05/03/2022 0745   LABSPEC 1.015 05/03/2022 0745   PHURINE 5.5 05/03/2022 0745   GLUCOSEU NEGATIVE 05/03/2022 0745   HGBUR SMALL (A) 05/03/2022 0745   BILIRUBINUR NEGATIVE 05/03/2022 0745   KETONESUR NEGATIVE 05/03/2022 0745   PROTEINUR NEGATIVE 05/03/2022 0745   NITRITE NEGATIVE 05/03/2022 0745   LEUKOCYTESUR NEGATIVE 05/03/2022 0745    Sepsis Labs: Lactic Acid, Venous No results found for: "LATICACIDVEN"  MICROBIOLOGY: No results found for this or any previous visit (from the past 240 hour(s)).  RADIOLOGY STUDIES/RESULTS: DG Chest Portable 1 View  Result Date: 05/03/2022 CLINICAL DATA:  Weakness, lethargy and confusion. EXAM: PORTABLE CHEST 1 VIEW COMPARISON:  PA and lateral chest 03/18/2022. FINDINGS: The lungs are clear. Mild cardiomegaly. No pneumothorax or pleural fluid. No acute or focal bony IMPRESSION: No acute disease. Electronically Signed   By: Inge Rise M.D.   On: 05/03/2022 08:32     LOS: 1 day   Oren Binet, MD  [facility]    To  contact the attending provider between 7A-7P or the covering provider during after hours 7P-7A, please log into the web site [email or web address] and access using universal [place] password for that web site. If you do not have the password, please call the hospital operator.  05/04/2022, 8:58 AM

## 2022-05-04 NOTE — Progress Notes (Signed)
Daily Progress Note  Hospital Day: 2  Chief Complaint: anemia  FOBT+   Assessment   Brief Narrative:  [name] is a 77 y.o. male with a pmh not limited to HTN, HLD, Afib on warfarin, MR, pulmonary HTN , DM, lower extremity cellulitis / chronic wounds.  Admitted with symptomatic anemia.   # 77 yo male with symptomatic anemia on warfarin. Received 2 units of blood ( last one yesterday around 5 pm ) . Hgb improved from 5.5 to 7.2 but down overnight again to 6.8.  # Atrial Fibrillation, on warfarin at home. INR 1.4  # AKI on CKD Cr improved overnight 2.3 >> 1.96  # Bilateral LE cellulitis.   Plan   Another 2u PRBC has already been ordered by [facility] Keep NPO for probable EGD later today once he has at least 1 unit of blood transfussed Continue PPI infusion ( might deescalate depending on EGD results)   Subjective  No complaints   Objective    Imaging:  DG Chest Portable 1 View  Result Date: 05/03/2022 CLINICAL DATA:  Weakness, lethargy and confusion. EXAM: PORTABLE CHEST 1 VIEW COMPARISON:  PA and lateral chest 03/18/2022. FINDINGS: The lungs are clear. Mild cardiomegaly. No pneumothorax or pleural fluid. No acute or focal bony IMPRESSION: No acute disease. Electronically Signed   By: Inge Rise M.D.   On: 05/03/2022 08:32    Lab Results: Recent Labs    05/03/22 0755 05/04/22 0046 05/04/22 0420 05/04/22 0718  WBC 17.6* 16.0*  --  13.6*  HGB 6.6* 7.2* 7.2* 6.8*  HCT 20.1* 21.9* 22.2* 19.8*  PLT 151 131*  --  127*   BMET Recent Labs    05/03/22 0755 05/04/22 0046  NA 132* 136  K 5.6* 5.2*  CL 105 108  CO2 18* 21*  GLUCOSE 258* 186*  BUN 139* 132*  CREATININE 2.30* 1.96*  CALCIUM 8.6* 8.5*   LFT Recent Labs    05/03/22 0755  PROT 4.7*  ALBUMIN 1.9*  AST 20  ALT 21  ALKPHOS 52  BILITOT 0.2*   PT/INR Recent Labs    05/03/22 0755 05/04/22 0046  LABPROT 32.6* 16.7*  INR 3.2* 1.4*     Scheduled inpatient medications:    sodium chloride   Intravenous Once   sodium chloride   Intravenous Once   sodium chloride   Intravenous Once   acetaminophen  650 mg Oral Once   cefdinir  300 mg Oral BID   diphenhydrAMINE  25 mg Oral Once   DULoxetine  20 mg Oral Daily   furosemide  20 mg Intravenous Once   gabapentin  100 mg Oral TID   insulin aspart  0-5 Units Subcutaneous QHS   insulin aspart  0-9 Units Subcutaneous TID WC   [START ON 05/06/2022] pantoprazole  40 mg Intravenous Q12H   predniSONE  5 mg Oral Q breakfast   simvastatin  20 mg Oral QHS   sodium chloride flush  3 mL Intravenous Q12H   sodium zirconium cyclosilicate  10 g Oral Daily   tamsulosin  0.4 mg Oral Daily   Continuous inpatient infusions:   pantoprazole 8 mg/hr (05/04/22 0312)   PRN inpatient medications: acetaminophen **OR** acetaminophen, albuterol, lidocaine, oxyCODONE  Vital signs in last 24 hours: Temp:  [97.2 F (36.2 C)-97.9 F (36.6 C)] 97.5 F (36.4 C) (02/19 0800) Pulse Rate:  [79-121] 121 (02/19 0800) Resp:  [15-23] 18 (02/19 0800) BP: (91-129)/(52-80) 91/63 (02/19 0800) SpO2:  [90 %-100 %]  90 % (02/19 0800) Last BM Date : 05/03/22  Intake/Output Summary (Last 24 hours) at 05/04/2022 0832 Last data filed at 05/03/2022 2350 Gross per 24 hour  Intake 767 ml  Output 450 ml  Net 317 ml    Intake/Output from previous day: 02/18 0701 - 02/19 0700 In: 1017 [I.V.:250; Blood:717; IV Piggyback:50] Out: 450 [Urine:450] Intake/Output this shift: No intake/output data recorded.   Physical Exam:  General: Alert male in NAD Heart:  Regular rate irregular rhythm.  Pulmonary: Normal respiratory effort Abdomen: Soft, nondistended, nontender. Normal bowel sounds.  Neurologic: Alert and oriented Psych: Pleasant. Cooperative.    Principal Problem:   Symptomatic anemia Active Problems:   Hypertension   Controlled type 2 diabetes mellitus without complication, without long-term current use of insulin (HCC)   Acute kidney injury  superimposed on CKD (HCC)   Hyperkalemia   Stasis dermatitis   BPH (benign prostatic hyperplasia)   Leukocytosis   Supratherapeutic INR   Persistent atrial fibrillation (HCC)   Leukocytoclastic vasculitis ([place])     LOS: 1 day   Tye Savoy ,NP 05/04/2022, 8:32 AM

## 2022-05-05 ENCOUNTER — Inpatient Hospital Stay (HOSPITAL_COMMUNITY): Payer: Medicare Other

## 2022-05-05 DIAGNOSIS — Z7901 Long term (current) use of anticoagulants: Secondary | ICD-10-CM

## 2022-05-05 DIAGNOSIS — I872 Venous insufficiency (chronic) (peripheral): Secondary | ICD-10-CM | POA: Diagnosis not present

## 2022-05-05 DIAGNOSIS — R55 Syncope and collapse: Secondary | ICD-10-CM | POA: Diagnosis not present

## 2022-05-05 DIAGNOSIS — I10 Essential (primary) hypertension: Secondary | ICD-10-CM | POA: Diagnosis not present

## 2022-05-05 DIAGNOSIS — R000 Tachycardia, unspecified: Secondary | ICD-10-CM

## 2022-05-05 DIAGNOSIS — D649 Anemia, unspecified: Secondary | ICD-10-CM | POA: Diagnosis not present

## 2022-05-05 DIAGNOSIS — K922 Gastrointestinal hemorrhage, unspecified: Secondary | ICD-10-CM | POA: Diagnosis not present

## 2022-05-05 LAB — TYPE AND SCREEN
ABO/RH(D): A POS
Antibody Screen: NEGATIVE
Unit division: 0
Unit division: 0
Unit division: 0
Unit division: 0

## 2022-05-05 LAB — BPAM RBC
Blood Product Expiration Date: 202402242359
Blood Product Expiration Date: 202403042359
Blood Product Expiration Date: 202403062359
Blood Product Expiration Date: 202403062359
ISSUE DATE / TIME: 202402180952
ISSUE DATE / TIME: 202402181319
ISSUE DATE / TIME: 202402190919
ISSUE DATE / TIME: 202402191458
Unit Type and Rh: 6200
Unit Type and Rh: 6200
Unit Type and Rh: 6200
Unit Type and Rh: 6200

## 2022-05-05 LAB — CBC
HCT: 26.2 % — ABNORMAL LOW (ref 39.0–52.0)
Hemoglobin: 8.7 g/dL — ABNORMAL LOW (ref 13.0–17.0)
MCH: 31.2 pg (ref 26.0–34.0)
MCHC: 33.2 g/dL (ref 30.0–36.0)
MCV: 93.9 fL (ref 80.0–100.0)
Platelets: 126 10*3/uL — ABNORMAL LOW (ref 150–400)
RBC: 2.79 MIL/uL — ABNORMAL LOW (ref 4.22–5.81)
RDW: 19.5 % — ABNORMAL HIGH (ref 11.5–15.5)
WBC: 11.9 10*3/uL — ABNORMAL HIGH (ref 4.0–10.5)
nRBC: 0.3 % — ABNORMAL HIGH (ref 0.0–0.2)

## 2022-05-05 LAB — BASIC METABOLIC PANEL
Anion gap: 7 (ref 5–15)
BUN: 94 mg/dL — ABNORMAL HIGH (ref 8–23)
CO2: 24 mmol/L (ref 22–32)
Calcium: 8.2 mg/dL — ABNORMAL LOW (ref 8.9–10.3)
Chloride: 108 mmol/L (ref 98–111)
Creatinine, Ser: 1.89 mg/dL — ABNORMAL HIGH (ref 0.61–1.24)
GFR, Estimated: 37 mL/min — ABNORMAL LOW (ref >60)
Glucose, Bld: 163 mg/dL — ABNORMAL HIGH (ref 70–99)
Potassium: 4 mmol/L (ref 3.5–5.1)
Sodium: 139 mmol/L (ref 135–145)

## 2022-05-05 LAB — ECHOCARDIOGRAM COMPLETE
Height: 75 in
S' Lateral: 2.8 cm
Weight: 3408 oz

## 2022-05-05 LAB — GLUCOSE, CAPILLARY
Glucose-Capillary: 154 mg/dL — ABNORMAL HIGH (ref 70–99)
Glucose-Capillary: 167 mg/dL — ABNORMAL HIGH (ref 70–99)

## 2022-05-05 LAB — PROTIME-INR
INR: 1.4 — ABNORMAL HIGH (ref 0.8–1.2)
Prothrombin Time: 16.9 seconds — ABNORMAL HIGH (ref 11.4–15.2)

## 2022-05-05 LAB — SURGICAL PATHOLOGY

## 2022-05-05 MED ORDER — SODIUM CHLORIDE 0.9 % IV SOLN: INTRAVENOUS | Status: AC

## 2022-05-05 MED ORDER — PREDNISONE 5 MG PO TABS
10.0000 mg | ORAL_TABLET | Freq: Every day | ORAL | Status: DC
  Administered 2022-05-06 – 2022-05-10 (×5): 10 mg via ORAL
  Filled 2022-05-05 (×5): qty 2

## 2022-05-05 MED ORDER — PREDNISONE 5 MG PO TABS
5.0000 mg | ORAL_TABLET | Freq: Once | ORAL | Status: AC
  Administered 2022-05-05: 5 mg via ORAL
  Filled 2022-05-05: qty 1

## 2022-05-05 NOTE — Progress Notes (Signed)
OT Cancellation Note  Patient Details Name: ASADBEK BLOME MRN: [id number] DOB: 07-16-1945   Cancelled Treatment:    Reason Eval/Treat Not Completed: Patient at procedure or test/ unavailable (vascular)  Elliot Cousin 05/05/2022, 3:46 PM

## 2022-05-05 NOTE — Transitions of Care (Post Inpatient/ED Visit) (Signed)
   05/05/2022  Name: [name] MRN: [id number] DOB: 1946/02/04  Today's TOC FU Call Status: Today's TOC FU Call Status:: Successful TOC FU Call Competed Unsuccessful Call (1st Attempt) Date: 05/04/22 [facility] FU Call Complete Date: 05/05/22  Transition Care Management Follow-up Telephone Call Date of Discharge: 04/30/22 Discharge Facility: Other ([place]) Name of Other (Non-[facility]) Discharge Facility: [facility] Type of Discharge: Inpatient Admission Primary Inpatient Discharge Diagnosis:: Cellulitis and abscess of leg  Items Reviewed:    Home Care and Equipment/Supplies:    Functional Questionnaire:    Folllow up appointments reviewed:    pt back inpatient at [place] Direct Dial [phone]

## 2022-05-05 NOTE — Evaluation (Signed)
Physical Therapy Evaluation Patient Details Name: [name] MRN: [id number] DOB: February 12, 1946 Today's Date: 05/05/2022  History of Present Illness  77 y/o M admitted to [facility] on 2/18 with complaints of wound infection and LOC. Pt recently admitted to [facility] from 2/12-2/15 fir cellulitis of BLE. 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  Clinical Impression  Pt presents today with impaired mobility, limited by BP, pain, and strength. Pt reports utilizing SPC at baseline however has been relying on a RW and supervision from his wife recently after previous hospital admission, able to mobilize without physical assistance but feeling weaker than normal. Pt requiring minG to minA for bed mobility today, however pt reporting dizziness while seated EOB, BP noted to have dropped to 78/47(58) mmHg so pt returned to supine, BP increasing to 104/68(81). Unable to progress to OOB mobility due to drop in BP and pt's symptoms but pt will continue to benefit from skilled acute PT to progress mobility. Pt with full time care at home and plans to return, recommending HHPT at this time but will continue to assess with future session. Acute PT will continue to follow as appropriate.      Recommendations for follow up therapy are one component of a multi-disciplinary discharge planning process, led by the attending physician.  Recommendations may be updated based on patient status, additional functional criteria and insurance authorization.  Follow Up Recommendations Home health PT (pending progression with orthostatic resolution)      Assistance Recommended at Discharge Intermittent Supervision/Assistance  Patient can return home with the following  Help with stairs or ramp for entrance;Assist for transportation;A little help with  walking and/or transfers;Assistance with cooking/housework    Equipment Recommendations None recommended by PT (will continue to evaluate pending OOB mobility)  Recommendations for Other Services       Functional Status Assessment Patient has had a recent decline in their functional status and demonstrates the ability to make significant improvements in function in a reasonable and predictable amount of time.     Precautions / Restrictions Precautions Precautions: Fall Precaution Comments: watch BP Restrictions Weight Bearing Restrictions: No      Mobility  Bed Mobility Overal bed mobility: Needs Assistance Bed Mobility: Supine to Sit, Sit to Supine     Supine to sit: Min guard, HOB elevated Sit to supine: Min assist, HOB elevated   General bed mobility comments: pt utilizing side rail, minA for sit>supine for BLE management    Transfers                   General transfer comment: pt orthostatic while EOB, OOB mobility deferred    Ambulation/Gait               General Gait Details: deferred due to drop in BP while seated  Stairs            Wheelchair Mobility    Modified Rankin (Stroke Patients Only)       Balance Overall balance assessment: Needs assistance Sitting-balance support: Single extremity supported, Feet supported Sitting balance-Leahy Scale: Fair Sitting balance - Comments: stable with static sitting although 1UE support utilized as pt reporting dizziness, drop in BP noted  Pertinent Vitals/Pain Pain Assessment Pain Assessment: Faces Faces Pain Scale: Hurts even more Pain Location: RLE Pain Descriptors / Indicators: Grimacing, Guarding Pain Intervention(s): Monitored during session, Repositioned, Limited activity within patient's tolerance    Home Living Family/patient expects to be discharged to:: Private residence Living Arrangements: Spouse/significant other Available  Help at Discharge: Family;Available 24 hours/day Type of Home: House Home Access: Stairs to enter Entrance Stairs-Rails: Right Entrance Stairs-Number of Steps: 3 Alternate Level Stairs-Number of Steps: 14 Home Layout: Two level;Bed/bath upstairs Home Equipment: Rolling Walker (2 wheels);Cane - single point;Cane - quad;BSC/3in1;Wheelchair - manual Additional Comments: pt lives with his wife who is available full time    Prior Function Prior Level of Function : Independent/Modified Independent;Driving             Mobility Comments: pt was independent with mobility with SPC however has been utilizing a RW recently with wife providing supervision after most recent hospitalization       Hand Dominance   Dominant Hand: Right    Extremity/Trunk Assessment   Upper Extremity Assessment Upper Extremity Assessment: Defer to OT evaluation    Lower Extremity Assessment Lower Extremity Assessment: Generalized weakness (limited mildly by pain with R hip flexion, assessment performed in bed as pt unable to tolerate EOB, noted B wounds on BLE)    Cervical / Trunk Assessment Cervical / Trunk Assessment: Normal  Communication   Communication: No difficulties  Cognition Arousal/Alertness: Awake/alert Behavior During Therapy: WFL for tasks assessed/performed Overall Cognitive Status: Within Functional Limits for tasks assessed                                 General Comments: A&Ox4, pleasant throughout session        General Comments General comments (skin integrity, edema, etc.): SPO2 stable on room air, BP dropping to 78/47(58) while seated EOB, pt symptomatic, HR elevated to 140s. BP returning to 104/68(81) once returned to supine    Exercises     Assessment/Plan    PT Assessment Patient needs continued PT services  PT Problem List Decreased strength;Decreased activity tolerance;Decreased balance;Decreased mobility;Cardiopulmonary status limiting activity        PT Treatment Interventions DME instruction;Gait training;Stair training;Functional mobility training;Therapeutic activities;Therapeutic exercise;Balance training;Neuromuscular re-education;Patient/family education    PT Goals (Current goals can be found in the Care Plan section)  Acute Rehab PT Goals Patient Stated Goal: go home PT Goal Formulation: With patient Time For Goal Achievement: 05/19/22 Potential to Achieve Goals: Good    Frequency Min 3X/week     Co-evaluation               AM-PAC PT "6 Clicks" Mobility  Outcome Measure Help needed turning from your back to your side while in a flat bed without using bedrails?: A Little Help needed moving from lying on your back to sitting on the side of a flat bed without using bedrails?: A Little Help needed moving to and from a bed to a chair (including a wheelchair)?: A Lot Help needed standing up from a chair using your arms (e.g., wheelchair or bedside chair)?: A Lot Help needed to walk in hospital room?: A Lot Help needed climbing 3-5 steps with a railing? : A Lot 6 Click Score: 14    End of Session   Activity Tolerance: Treatment limited secondary to medical complications (Comment) (drop in BP) Patient left: in bed;with call bell/phone within reach;with bed alarm set Nurse Communication: Mobility status;Other (  comment) (BP) PT Visit Diagnosis: Muscle weakness (generalized) (M62.81);Difficulty in walking, not elsewhere classified (R26.2)    Time: [id number] PT Time Calculation (min) (ACUTE ONLY): 19 min   Charges:   PT Evaluation $PT Eval Moderate Complexity: 1 Mod          Charlynne Cousins, PT DPT Acute Rehabilitation Services Office [phone]   Luvenia Heller 05/05/2022, 2:46 PM

## 2022-05-05 NOTE — Progress Notes (Signed)
Daily Progress Note  Hospital Day: 3  Chief Complaint:  anemia, FOBT+  Assessment   Brief Narrative:  [name] is a 77 y.o. male with a pmh not limited to HTN, HLD, Afib on warfarin, MR, pulmonary HTN , DM, lower extremity cellulitis / chronic wounds. Admitted with symptomatic anemia.   # 77 yo male with symptomatic anemia on warfarin / Non-bleeding gastric ulcer and a few gastric polyps on EGD 2/19. Received total of 4 units of blood . Hgb now stable at 8.7.   # Atrial Fibrillation, on warfarin at home.    # AKI on CKD.  Cr continues to improve.    # Bilateral LE cellulitis.   Plan   -Office follow up with me 06/08/22 at 9am  -Will need repeat EGD in 6-8 weeks to document ulcer healing. -Consider screening colonoscopy at same time ( suggested in 2022 but not done)  -Awaiting gastric biopsies to check for H.pylori -Continue pantoprazole 40 mg BID x 8 weeks.  -Avoid NSAIDs ( takes Ibuprofen)  Subjective  Feels okay today We reviewed EGD findings  Objective    EGD 05/04/22   Imaging:  DG Chest Portable 1 View  Result Date: 05/03/2022 CLINICAL DATA:  Weakness, lethargy and confusion. EXAM: PORTABLE CHEST 1 VIEW COMPARISON:  PA and lateral chest 03/18/2022. FINDINGS: The lungs are clear. Mild cardiomegaly. No pneumothorax or pleural fluid. No acute or focal bony IMPRESSION: No acute disease. Electronically Signed   By: Inge Rise M.D.   On: 05/03/2022 08:32    Lab Results: Recent Labs    05/04/22 0718 05/04/22 1753 05/04/22 2115 05/05/22 0440  WBC 13.6* 12.0*  --  11.9*  HGB 6.8* 7.8* 8.7* 8.7*  HCT 19.8* 23.0* 26.0* 26.2*  PLT 127* 119*  --  126*   BMET Recent Labs    05/03/22 0755 05/04/22 0046 05/05/22 0440  NA 132* 136 139  K 5.6* 5.2* 4.0  CL 105 108 108  CO2 18* 21* 24  GLUCOSE 258* 186* 163*  BUN 139* 132* 94*  CREATININE 2.30* 1.96* 1.89*  CALCIUM 8.6* 8.5* 8.2*   LFT Recent Labs    05/03/22 0755  PROT 4.7*  ALBUMIN 1.9*   AST 20  ALT 21  ALKPHOS 52  BILITOT 0.2*   PT/INR Recent Labs    05/04/22 0046 05/05/22 0440  LABPROT 16.7* 16.9*  INR 1.4* 1.4*     Scheduled inpatient medications:   sodium chloride   Intravenous Once   sodium chloride   Intravenous Once   cefdinir  300 mg Oral BID   DULoxetine  20 mg Oral Daily   gabapentin  100 mg Oral TID   insulin aspart  0-5 Units Subcutaneous QHS   insulin aspart  0-9 Units Subcutaneous TID WC   [START ON 05/06/2022] pantoprazole  40 mg Intravenous Q12H   predniSONE  5 mg Oral Q breakfast   silver sulfADIAZINE   Topical BID   simvastatin  20 mg Oral QHS   sodium chloride flush  3 mL Intravenous Q12H   tamsulosin  0.4 mg Oral Daily   Continuous inpatient infusions:   pantoprazole 8 mg/hr (05/04/22 2040)   PRN inpatient medications: acetaminophen **OR** acetaminophen, albuterol, lidocaine, oxyCODONE  Vital signs in last 24 hours: Temp:  [97 F (36.1 C)-98.2 F (36.8 C)] 97.5 F (36.4 C) (02/20 0303) Pulse Rate:  [86-117] 110 (02/20 0303) Resp:  [10-30] 18 (02/20 0303) BP: (96-122)/(56-76) 117/61 (02/20 0303) SpO2:  [93 %-  98 %] 97 % (02/20 0303) Last BM Date : 05/04/22  Intake/Output Summary (Last 24 hours) at 05/05/2022 0853 Last data filed at 05/05/2022 0400 Gross per 24 hour  Intake 1092 ml  Output 3600 ml  Net -2508 ml    Intake/Output from previous day: 02/19 0701 - 02/20 0700 In: 1092 [I.V.:400; Blood:692] Out: 3600 [Urine:3600] Intake/Output this shift: No intake/output data recorded.   Physical Exam:  General: Alert male in NAD Heart:  Regular rate and rhythm.  Pulmonary: Normal respiratory effort Abdomen: Soft, nondistended, nontender. Normal bowel sounds. Neurologic: Alert and oriented Psych: Pleasant. Cooperative.    Principal Problem:   Symptomatic anemia Active Problems:   Hypertension   Controlled type 2 diabetes mellitus without complication, without long-term current use of insulin (HCC)   Acute kidney  injury superimposed on CKD (HCC)   Hyperkalemia   Stasis dermatitis   BPH (benign prostatic hyperplasia)   Leukocytosis   Supratherapeutic INR   Persistent atrial fibrillation (HCC)   Leukocytoclastic vasculitis ([place])   Duodenal ulcer     LOS: 2 days   Tye Savoy ,NP 05/05/2022, 8:53 AM

## 2022-05-05 NOTE — Progress Notes (Signed)
PROGRESS NOTE        PATIENT DETAILS Name: [name] Age: 77 y.o. Sex: male Date of Birth: November 15, 1945 Admit Date: 05/03/2022 Admitting Physician Norval Morton, MD PCP:de [place], Raymond J, MD  Brief Summary: Patient is a 77 y.o.  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  Significant events: 2/18>> admit to [facility]  Significant studies: 2/18>> CXR: No PNA  Significant microbiology data: None  Procedures: 2/19>> EGD: Few gastric polyps, nonbleeding duodenal ulcer  Consults: GI  Subjective: No BM since yesterday.  No abdominal pain or chest pain.  Objective: Vitals: Blood pressure 114/65, pulse (!) 112, temperature 97.8 F (36.6 C), temperature source Oral, resp. rate 20, height 6' 3"$  (1.905 m), weight 96.6 kg, SpO2 97 %.   Exam: Gen Exam:Alert awake-not in any distress HEENT:atraumatic, normocephalic Chest: B/L clear to auscultation anteriorly CVS:S1S2 regular Abdomen:soft non tender, non distended Extremities:no edema-both legs are wrapped in Ace dressing. Neurology: Non focal Skin: no rash  Pertinent Labs/Radiology:    Latest Ref Rng & Units 05/05/2022    4:40 AM 05/04/2022    9:15 PM 05/04/2022    5:53 PM  CBC  WBC 4.0 - 10.5 K/uL 11.9   12.0   Hemoglobin 13.0 - 17.0 g/dL 8.7  8.7  7.8   Hematocrit 39.0 - 52.0 % 26.2  26.0  23.0   Platelets 150 - 400 K/uL 126   119     Lab Results  Component Value Date   NA 139 05/05/2022   K 4.0 05/05/2022   CL 108 05/05/2022   CO2 24 05/05/2022      Assessment/Plan: Upper GI bleeding with acute blood loss anemia secondary to  duodenal ulcer No further dark stools Hb stable after a total of 4 units of PRBC Continue PPI Avoid NSAIDs Await biopsy to rule out H. pylori GI recommending second look EGD in 6 to 8 weeks    Syncope Suspect either vasovagal or orthostatic-in the setting of GI bleeding Telemetry with stable atrial fibrillation Await echo Orthostatic vital signs with PT/nursing staff today.   AKI on CKD stage IIIb AKI due to GI bleeding/hemodynamically mediated Improved-creatinine close to baseline Continue to avoid nephrotoxic agents.   Hyperkalemia Resolved with Lokelma and improvement in renal function. Follow electrolytes periodically.  Chronic atrial fibrillation Rate controlled On Coumadin prior to this hospitalization-with plans to transition to Eliquis when INR was subtherapeutic. He has not been on Coumadin since 2/10-INR was still 3.2 on presentation-likely due to interaction with antibiotic/cefdinir Due to GI bleeding-was given 1 dose of vitamin K on 2/18.  After discussion with GI MD on 2/19-we decided to hold off on initiating anticoagulation for 24-48 hours, feel that if he is stable without any GI bleeding-we could potentially resume anticoagulation on 2/21.  Patient is aware that we will try to switch him to Eliquis as planned.  HTN All antihypertensives held initially due to GI bleeding Blood pressure remains stable without the use of any antihypertensives.   Resume antihypertensives when able.  DM-2 (A1c 6.7 on 1/9) CBGs stable with SSI Continue to hold all oral hypoglycemics.   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   Weight as of this encounter: 96.6 kg.   Code  status:   Code Status: Full Code   DVT Prophylaxis: SCDs Start: 05/03/22 1042   Family Communication:Spouse-Fran [phone] -updated 2/20   Disposition Plan: Status is: Inpatient Remains inpatient appropriate because: Severity of illness   Planned Discharge Destination:Home   Diet: Diet Order             Diet clear liquid Room service appropriate? Yes; Fluid consistency: Thin  Diet effective now                     Antimicrobial agents: Anti-infectives (From admission, onward)    Start     Dose/Rate Route Frequency Ordered Stop   05/03/22 2200  cefdinir (OMNICEF) capsule 300 mg        300 mg Oral 2 times daily 05/03/22 1900          MEDICATIONS: Scheduled Meds:  sodium chloride   Intravenous Once   sodium chloride   Intravenous Once   cefdinir  300 mg Oral BID   DULoxetine  20 mg Oral Daily   gabapentin  100 mg Oral TID   insulin aspart  0-5 Units Subcutaneous QHS   insulin aspart  0-9 Units Subcutaneous TID WC   [START ON 05/06/2022] pantoprazole  40 mg Intravenous Q12H   predniSONE  5 mg Oral Q breakfast   silver sulfADIAZINE   Topical BID   simvastatin  20 mg Oral QHS   sodium chloride flush  3 mL Intravenous Q12H   tamsulosin  0.4 mg Oral Daily   Continuous Infusions:  pantoprazole 8 mg/hr (05/04/22 2040)   PRN Meds:.acetaminophen **OR** acetaminophen, albuterol, lidocaine, oxyCODONE   I have personally reviewed following labs and imaging studies  LABORATORY DATA: CBC: Recent Labs  Lab 05/03/22 0755 05/04/22 0046 05/04/22 0420 05/04/22 0718 05/04/22 1753 05/04/22 2115 05/05/22 0440  WBC 17.6* 16.0*  --  13.6* 12.0*  --  11.9*  NEUTROABS 15.3*  --   --   --   --   --   --   HGB 6.6* 7.2* 7.2* 6.8* 7.8* 8.7* 8.7*  HCT 20.1* 21.9* 22.2* 19.8* 23.0* 26.0* 26.2*  MCV 98.5 95.6  --  94.3 92.4  --  93.9  PLT 151 131*  --  127* 119*  --  126*     Basic Metabolic Panel: Recent Labs  Lab 05/03/22 0755 05/04/22 0046  05/05/22 0440  NA 132* 136 139  K 5.6* 5.2* 4.0  CL 105 108 108  CO2 18* 21* 24  GLUCOSE 258* 186* 163*  BUN 139* 132* 94*  CREATININE 2.30* 1.96* 1.89*  CALCIUM 8.6* 8.5* 8.2*  MG  --  2.3  --      GFR: Estimated Creatinine Clearance: 40.4 mL/min (A) (by C-G formula based on SCr of 1.89 mg/dL (H)).  Liver Function Tests: Recent Labs  Lab 05/03/22 0755  AST 20  ALT 21  ALKPHOS 52  BILITOT 0.2*  PROT 4.7*  ALBUMIN 1.9*    No results for input(s): "LIPASE", "AMYLASE" in the last 168 hours. No results for input(s): "AMMONIA"  in the last 168 hours.  Coagulation Profile: Recent Labs  Lab 05/01/22 0818 05/03/22 0755 05/04/22 0046 05/05/22 0440  INR 3.4* 3.2* 1.4* 1.4*     Cardiac Enzymes: No results for input(s): "CKTOTAL", "CKMB", "CKMBINDEX", "TROPONINI" in the last 168 hours.  BNP (last 3 results) No results for input(s): "PROBNP" in the last 8760 hours.  Lipid Profile: No results for input(s): "CHOL", "HDL", "LDLCALC", "TRIG", "CHOLHDL", "LDLDIRECT" in the last 72 hours.  Thyroid Function Tests: No results for input(s): "TSH", "T4TOTAL", "FREET4", "T3FREE", "THYROIDAB" in the last 72 hours.  Anemia Panel: No results for input(s): "VITAMINB12", "FOLATE", "FERRITIN", "TIBC", "IRON", "RETICCTPCT" in the last 72 hours.  Urine analysis:    Component Value Date/Time   COLORURINE YELLOW 05/03/2022 0745   APPEARANCEUR CLEAR 05/03/2022 0745   LABSPEC 1.015 05/03/2022 0745   PHURINE 5.5 05/03/2022 0745   GLUCOSEU NEGATIVE 05/03/2022 0745   HGBUR SMALL (A) 05/03/2022 0745   BILIRUBINUR NEGATIVE 05/03/2022 0745   KETONESUR NEGATIVE 05/03/2022 0745   PROTEINUR NEGATIVE 05/03/2022 0745   NITRITE NEGATIVE 05/03/2022 0745   LEUKOCYTESUR NEGATIVE 05/03/2022 0745    Sepsis Labs: Lactic Acid, Venous No results found for: "LATICACIDVEN"  MICROBIOLOGY: No results found for this or any previous visit (from the past 240 hour(s)).  RADIOLOGY STUDIES/RESULTS: No  results found.   LOS: 2 days   Oren Binet, MD  [facility]    To contact the attending provider between 7A-7P or the covering provider during after hours 7P-7A, please log into the web site [email or web address] and access using universal [place] password for that web site. If you do not have the password, please call the hospital operator.  05/05/2022, 10:03 AM

## 2022-05-05 NOTE — Progress Notes (Signed)
Echocardiogram 2D Echocardiogram has been performed.  Oneal Deputy Geneal Huebert RDCS 05/05/2022, 3:48 PM

## 2022-05-06 ENCOUNTER — Encounter (HOSPITAL_COMMUNITY): Payer: Self-pay | Admitting: Gastroenterology

## 2022-05-06 ENCOUNTER — Other Ambulatory Visit (HOSPITAL_COMMUNITY): Payer: Self-pay

## 2022-05-06 DIAGNOSIS — I10 Essential (primary) hypertension: Secondary | ICD-10-CM | POA: Diagnosis not present

## 2022-05-06 DIAGNOSIS — K922 Gastrointestinal hemorrhage, unspecified: Secondary | ICD-10-CM | POA: Diagnosis not present

## 2022-05-06 DIAGNOSIS — I872 Venous insufficiency (chronic) (peripheral): Secondary | ICD-10-CM | POA: Diagnosis not present

## 2022-05-06 DIAGNOSIS — D649 Anemia, unspecified: Secondary | ICD-10-CM | POA: Diagnosis not present

## 2022-05-06 LAB — CBC
HCT: 24.8 % — ABNORMAL LOW (ref 39.0–52.0)
Hemoglobin: 8 g/dL — ABNORMAL LOW (ref 13.0–17.0)
MCH: 31.1 pg (ref 26.0–34.0)
MCHC: 32.3 g/dL (ref 30.0–36.0)
MCV: 96.5 fL (ref 80.0–100.0)
Platelets: 140 10*3/uL — ABNORMAL LOW (ref 150–400)
RBC: 2.57 MIL/uL — ABNORMAL LOW (ref 4.22–5.81)
RDW: 19.3 % — ABNORMAL HIGH (ref 11.5–15.5)
WBC: 10.4 10*3/uL (ref 4.0–10.5)
nRBC: 0.2 % (ref 0.0–0.2)

## 2022-05-06 LAB — GLUCOSE, CAPILLARY
Glucose-Capillary: 158 mg/dL — ABNORMAL HIGH (ref 70–99)
Glucose-Capillary: 258 mg/dL — ABNORMAL HIGH (ref 70–99)
Glucose-Capillary: 194 mg/dL — ABNORMAL HIGH (ref 70–99)
Glucose-Capillary: 254 mg/dL — ABNORMAL HIGH (ref 70–99)

## 2022-05-06 MED ORDER — SODIUM CHLORIDE 0.9 % IV SOLN: INTRAVENOUS | Status: AC

## 2022-05-06 MED ORDER — APIXABAN 5 MG PO TABS
5.0000 mg | ORAL_TABLET | Freq: Two times a day (BID) | ORAL | Status: DC
  Administered 2022-05-06 – 2022-05-10 (×9): 5 mg via ORAL
  Filled 2022-05-06 (×9): qty 1

## 2022-05-06 MED ORDER — MIDODRINE HCL 5 MG PO TABS
5.0000 mg | ORAL_TABLET | Freq: Three times a day (TID) | ORAL | Status: DC
  Administered 2022-05-06 (×2): 5 mg via ORAL
  Filled 2022-05-06 (×3): qty 1

## 2022-05-06 NOTE — Discharge Instructions (Signed)

## 2022-05-06 NOTE — Progress Notes (Addendum)
PROGRESS NOTE        PATIENT DETAILS Name: [name] Age: 77 y.o. Sex: male Date of Birth: 1945/08/28 Admit Date: 05/03/2022 Admitting Physician Norval Morton, MD PCP:de [place], Raymond J, MD  Brief Summary: Patient is a 77 y.o.  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  Significant events: 2/18>> admit to [facility]  Significant studies: 2/18>> CXR: No PNA  Significant microbiology data: None  Procedures: 2/19>> EGD: Few gastric polyps, nonbleeding duodenal ulcer 2/20>> echo: EF 70-75%, RVSP 77.3  Consults: GI  Subjective: Claims had a brown-colored BM yesterday.  Was very orthostatic and symptomatic when ambulated.  No other issues overnight.  No chest pain or shortness of breath.  Objective: Vitals: Blood pressure 112/71, pulse 98, temperature 97.9 F (36.6 C), temperature source Oral, resp. rate 15, height 6' 3"$  (1.905 m), weight 96.6 kg, SpO2 96 %.   Exam: Gen Exam:Alert awake-not in any distress HEENT:atraumatic, normocephalic Chest: B/L clear to auscultation anteriorly CVS:S1S2 regular Abdomen:soft non tender, non distended Extremities:no edema.  Both legs are wrapped in Ace bandages. Neurology: Non focal Skin: no rash  Pertinent Labs/Radiology:    Latest Ref Rng & Units 05/06/2022    4:42 AM 05/05/2022    4:40 AM 05/04/2022    9:15 PM  CBC  WBC 4.0 - 10.5 K/uL 10.4  11.9    Hemoglobin 13.0 - 17.0 g/dL 8.0  8.7  8.7   Hematocrit 39.0 - 52.0 % 24.8  26.2  26.0   Platelets 150 - 400 K/uL 140  126      Lab Results  Component Value Date   NA 139 05/05/2022   K 4.0 05/05/2022   CL 108 05/05/2022   CO2 24 05/05/2022      Assessment/Plan: Upper GI bleeding with acute blood loss anemia secondary to  duodenal ulcer Brown stools on 2/20  Hb stable-required a total of 4  units-last transfusion on 2/19.   EGD biopsy negative for H. pylori/malignancy Continue PPI infusion Follow CBC daily GI recommending second look EGD in 6 to 8 weeks Resume anticoagulation today-as noted in prior notes will be switched to Eliquis. Avoid NSAIDs.  Syncope Due to orthostatic hypotension in the setting of GI bleeding Telemetry with stable atrial fibrillation Echo with stable EF Orthostatic on 2/20-was hydrated with IVF for a few hours Hold Flomax today Have temporarily escalated chronic prednisone dosage to 10 mg for a few days Repeat orthostatic vital signs today  AKI on CKD stage IIIb AKI due to GI bleeding/hemodynamically mediated Improved-creatinine close to baseline Continue to avoid nephrotoxic agents.   Hyperkalemia Resolved with Lokelma and improvement in renal function. Follow electrolytes periodically.  Chronic atrial fibrillation Rate controlled On Coumadin prior to this hospitalization-with plans to transition to Eliquis when INR was subtherapeutic.  He was not on Coumadin since 2/10-INR was still therapeutic on initial presentation-likely due to interaction with cefdinir.  He required 1 dose of vitamin K on 2/18. GI bleeding seems to have resolved-brown stools yesterday-as discussed in prior notes-after discussion with GI MD-plan is to resume anticoagulation today.  As planned-will switch to Eliquis. Continue to monitor closely for any signs of bleeding.  HTN All antihypertensives held initially due to GI bleeding BP stable at rest  but has orthostatic vital signs when ambulated/stood up  Continue to hold all antihypertensives-repeat orthostatic vitals today.    DM-2 (A1c 6.7 on 1/9) CBGs stable with SSI Continue to hold all oral hypoglycemics.   Recent Labs    05/04/22 1553 05/05/22 0914 05/05/22 1156  GLUCAP 191* 154* 167*     HLD Statin  BPH Holding Flomax due to orthostatic vital signs  Chronic venous stasis involving bilateral lower  extremities Leukocytoclastic vasculitis Recent cellulitis of lower extremities-requiring hospitalization-and on cefdinir prior to this hospitalization Supportive care Wound care Continue prednisone-dosage escalated for a few days-as noted above Stop cefdinir 2/21  Debility/deconditioning PT/OT eval  BMI: Estimated body mass index is 26.62 kg/m as calculated from the following:   Height as of this encounter: 6' 3"$  (1.905 m).   Weight as of this encounter: 96.6 kg.   Code status:   Code Status: Full Code   DVT Prophylaxis: Place TED hose Start: 05/05/22 1126 SCDs Start: 05/03/22 1042   Family Communication:Spouse-Fran [phone] -updated 2/21   Disposition Plan: Status is: Inpatient Remains inpatient appropriate because: Severity of illness   Planned Discharge Destination:Home   Diet: Diet Order             Diet heart healthy/carb modified Room service appropriate? Yes; Fluid consistency: Thin  Diet effective now                     Antimicrobial agents: Anti-infectives (From admission, onward)    Start     Dose/Rate Route Frequency Ordered Stop   05/03/22 2200  cefdinir (OMNICEF) capsule 300 mg        300 mg Oral 2 times daily 05/03/22 1900          MEDICATIONS: Scheduled Meds:  sodium chloride   Intravenous Once   sodium chloride   Intravenous Once   cefdinir  300 mg Oral BID   DULoxetine  20 mg Oral Daily   gabapentin  100 mg Oral TID   insulin aspart  0-5 Units Subcutaneous QHS   insulin aspart  0-9 Units Subcutaneous TID WC   pantoprazole  40 mg Intravenous Q12H   predniSONE  10 mg Oral Q breakfast   silver sulfADIAZINE   Topical BID   simvastatin  20 mg Oral QHS   sodium chloride flush  3 mL Intravenous Q12H   Continuous Infusions:  pantoprazole 8 mg/hr (05/06/22 0501)   PRN Meds:.acetaminophen **OR** acetaminophen, albuterol, lidocaine, oxyCODONE   I have personally reviewed following labs and imaging  studies  LABORATORY DATA: CBC: Recent Labs  Lab 05/03/22 0755 05/04/22 0046 05/04/22 0420 05/04/22 0718 05/04/22 1753 05/04/22 2115 05/05/22 0440 05/06/22 0442  WBC 17.6* 16.0*  --  13.6* 12.0*  --  11.9* 10.4  NEUTROABS 15.3*  --   --   --   --   --   --   --   HGB 6.6* 7.2*   < > 6.8* 7.8* 8.7* 8.7* 8.0*  HCT 20.1* 21.9*   < > 19.8* 23.0* 26.0* 26.2* 24.8*  MCV 98.5 95.6  --  94.3 92.4  --  93.9 96.5  PLT 151 131*  --  127* 119*  --  126* 140*   < > = values in this interval not displayed.     Basic Metabolic Panel: Recent Labs  Lab 05/03/22 0755 05/04/22 0046 05/05/22 0440  NA 132* 136 139  K 5.6* 5.2* 4.0  CL 105 108 108  CO2 18* 21* 24  GLUCOSE 258* 186* 163*  BUN 139* 132* 94*  CREATININE 2.30* 1.96* 1.89*  CALCIUM 8.6* 8.5* 8.2*  MG  --  2.3  --      GFR: Estimated Creatinine Clearance: 40.4 mL/min (A) (by C-G formula based on SCr of 1.89 mg/dL (H)).  Liver Function Tests: Recent Labs  Lab 05/03/22 0755  AST 20  ALT 21  ALKPHOS 52  BILITOT 0.2*  PROT 4.7*  ALBUMIN 1.9*    No results for input(s): "LIPASE", "AMYLASE" in the last 168 hours. No results for input(s): "AMMONIA" in the last 168 hours.  Coagulation Profile: Recent Labs  Lab 05/01/22 0818 05/03/22 0755 05/04/22 0046 05/05/22 0440  INR 3.4* 3.2* 1.4* 1.4*     Cardiac Enzymes: No results for input(s): "CKTOTAL", "CKMB", "CKMBINDEX", "TROPONINI" in the last 168 hours.  BNP (last 3 results) No results for input(s): "PROBNP" in the last 8760 hours.  Lipid Profile: No results for input(s): "CHOL", "HDL", "LDLCALC", "TRIG", "CHOLHDL", "LDLDIRECT" in the last 72 hours.  Thyroid Function Tests: No results for input(s): "TSH", "T4TOTAL", "FREET4", "T3FREE", "THYROIDAB" in the last 72 hours.  Anemia Panel: No results for input(s): "VITAMINB12", "FOLATE", "FERRITIN", "TIBC", "IRON", "RETICCTPCT" in the last 72 hours.  Urine analysis:    Component Value Date/Time   COLORURINE  YELLOW 05/03/2022 0745   APPEARANCEUR CLEAR 05/03/2022 0745   LABSPEC 1.015 05/03/2022 0745   PHURINE 5.5 05/03/2022 0745   GLUCOSEU NEGATIVE 05/03/2022 0745   HGBUR SMALL (A) 05/03/2022 0745   BILIRUBINUR NEGATIVE 05/03/2022 0745   KETONESUR NEGATIVE 05/03/2022 0745   PROTEINUR NEGATIVE 05/03/2022 0745   NITRITE NEGATIVE 05/03/2022 0745   LEUKOCYTESUR NEGATIVE 05/03/2022 0745    Sepsis Labs: Lactic Acid, Venous No results found for: "LATICACIDVEN"  MICROBIOLOGY: No results found for this or any previous visit (from the past 240 hour(s)).  RADIOLOGY STUDIES/RESULTS: ECHOCARDIOGRAM COMPLETE  Result Date: 05/05/2022    ECHOCARDIOGRAM REPORT   Patient Name:   KAHLE REASER [facility] Date of Exam: 05/05/2022 Medical Rec #:  [id number]        Height:       75.0 in Accession #:    [id number]       Weight:       213.0 lb Date of Birth:  20-Apr-1945        BSA:          2.254 m Patient Age:    54 years         BP:           104/68 mmHg Patient Gender: M                HR:           144 bpm. Exam Location:  Inpatient Procedure: 2D Echo, Color Doppler and Cardiac Doppler Indications:    R00.0 Tachycardia; R55 Syncope  History:        Patient has prior history of Echocardiogram examinations, most                 recent 05/31/2020. Arrythmias:Atrial Fibrillation; Risk                 Factors:Hypertension, Diabetes, Dyslipidemia and Sleep Apnea.  Sonographer:    Raquel Sarna Senior RDCS Referring Phys: Ila  Sonographer Comments: HR has been variable since admission, patient has permanent A-fib IMPRESSIONS  1. Left ventricular ejection fraction, by estimation, is 70 to 75%. The left ventricle has hyperdynamic function. The left ventricle has no regional  wall motion abnormalities. Left ventricular diastolic parameters are indeterminate.  2. Right ventricular systolic function is normal. The right ventricular size is moderately enlarged. There is moderately elevated pulmonary artery systolic pressure. The  estimated right ventricular systolic pressure is 123456 mmHg.  3. Left atrial size was massively dilated.  4. Right atrial size was severely dilated.  5. The mitral valve is normal in structure. Severe mitral valve regurgitation.  6. Tricuspid valve regurgitation is severe.  7. The aortic valve is tricuspid. Aortic valve regurgitation is trivial.  8. Pulmonic valve regurgitation is moderate.  9. The inferior vena cava is normal in size with <50% respiratory variability, suggesting right atrial pressure of 8 mmHg. Comparison(s): LV is more vigorous from prior, imaged at 140 bpm + during this study. FINDINGS  Left Ventricle: Left ventricular ejection fraction, by estimation, is 70 to 75%. The left ventricle has hyperdynamic function. The left ventricle has no regional wall motion abnormalities. The left ventricular internal cavity size was normal in size. There is no left ventricular hypertrophy. Left ventricular diastolic parameters are indeterminate. Right Ventricle: The right ventricular size is moderately enlarged. No increase in right ventricular wall thickness. Right ventricular systolic function is normal. There is moderately elevated pulmonary artery systolic pressure. The tricuspid regurgitant  velocity is 3.51 m/s, and with an assumed right atrial pressure of 8 mmHg, the estimated right ventricular systolic pressure is 123456 mmHg. Left Atrium: Left atrial size was massively dilated. Right Atrium: Right atrial size was severely dilated. Pericardium: There is no evidence of pericardial effusion. Mitral Valve: The mitral valve is normal in structure. Severe mitral valve regurgitation. Tricuspid Valve: The tricuspid valve is normal in structure. Tricuspid valve regurgitation is severe. Aortic Valve: The aortic valve is tricuspid. Aortic valve regurgitation is trivial. Pulmonic Valve: The pulmonic valve was not well visualized. Pulmonic valve regurgitation is moderate. No evidence of pulmonic stenosis. Aorta: The  aortic root and ascending aorta are structurally normal, with no evidence of dilitation. Venous: The inferior vena cava is normal in size with less than 50% respiratory variability, suggesting right atrial pressure of 8 mmHg. IAS/Shunts: No atrial level shunt detected by color flow Doppler.  LEFT VENTRICLE PLAX 2D LVIDd:         4.20 cm LVIDs:         2.80 cm LV PW:         1.00 cm LV IVS:        0.90 cm LVOT diam:     2.20 cm LV SV:         69 LV SV Index:   30 LVOT Area:     3.80 cm  RIGHT VENTRICLE RV S prime:     18.00 cm/s TAPSE (M-mode): 2.0 cm LEFT ATRIUM              Index         RIGHT ATRIUM           Index LA diam:        7.80 cm  3.46 cm/m    RA Area:     40.40 cm LA Vol (A2C):   225.0 ml 99.83 ml/m   RA Volume:   143.00 ml 63.45 ml/m LA Vol (A4C):   268.0 ml 118.90 ml/m LA Biplane Vol: 252.0 ml 111.81 ml/m  AORTIC VALVE LVOT Vmax:   134.33 cm/s LVOT Vmean:  89.900 cm/s LVOT VTI:    0.181 m  AORTA Ao Root diam: 3.40 cm Ao Asc diam:  3.10  cm TRICUSPID VALVE TR Peak grad:   49.3 mmHg TR Vmax:        351.00 cm/s  SHUNTS Systemic VTI:  0.18 m Systemic Diam: 2.20 cm Rudean Haskell MD Electronically signed by Rudean Haskell MD Signature Date/Time: 05/05/2022/4:35:14 PM    Final      LOS: 3 days   Oren Binet, MD  [facility]    To contact the attending provider between 7A-7P or the covering provider during after hours 7P-7A, please log into the web site [email or web address] and access using universal [place] password for that web site. If you do not have the password, please call the hospital operator.  05/06/2022, 7:18 AM

## 2022-05-06 NOTE — Evaluation (Addendum)
Occupational Therapy Evaluation Patient Details Name: [name] MRN: [id number] DOB: 07/29/1945 Today's Date: 05/06/2022   History of Present Illness 77 y/o M admitted to [facility] on 2/18 with complaints of wound infection and LOC. Found with upper GI bleed and acute blood loss anemia. Pt recently admitted to [place] from 2/12-2/15 fir cellulitis of BLE. 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   Clinical Impression   PTA patient independent with ADLs and mobility using cane, reports transitioning to RW the day before admission only due to weakness. Admitted for above and presents with problem list below.  He is mildly confused today, has much difficulty recalling his birthday but successful given increased time and multiple attempts, demonstrates decreased recall and problem solving- further cognitive assessment next session recommended.  He completes ADLs with up to min assist, transfers with min assist but functional mobility limited by symptomatic blurry vision with sustained standing. See BP below, Spo2 stable on RA but HR max 162 with limited activity.  Based on performance today, believe he will best benefit from continued OT services acutely.  Anticipate he will progress to [facility] level for dc home.  Will follow.    BP pre sitting in recliner 115/70 (86) BP standing 116/73 (83) BP sitting after reports of blurry vision 105/74 (86) BP post sitting in recliner 113/76 (89)   Recommendations for follow up therapy are one component of a multi-disciplinary discharge planning process, led by the attending physician.  Recommendations may be updated based on patient status, additional functional criteria and insurance authorization.   Follow Up Recommendations  Home health OT (pending progress)     Assistance  Recommended at Discharge Frequent or constant Supervision/Assistance  Patient can return home with the following A little help with walking and/or transfers;A lot of help with bathing/dressing/bathroom;Assistance with cooking/housework;Assist for transportation;Help with stairs or ramp for entrance;Direct supervision/assist for financial management;Direct supervision/assist for medications management    Functional Status Assessment  Patient has had a recent decline in their functional status and demonstrates the ability to make significant improvements in function in a reasonable and predictable amount of time.  Equipment Recommendations  None recommended by OT    Recommendations for Other Services       Precautions / Restrictions Precautions Precautions: Fall Precaution Comments: watch BP Restrictions Weight Bearing Restrictions: No      Mobility Bed Mobility               General bed mobility comments: OOB in recliner    Transfers Overall transfer level: Needs assistance Equipment used: Rolling walker (2 wheels) Transfers: Sit to/from Stand Sit to Stand: Min assist           General transfer comment: cueing for hand placement and safety, min assist to power up and maintain balance      Balance Overall balance assessment: Needs assistance Sitting-balance support: No upper extremity supported, Feet supported Sitting balance-Leahy Scale: Fair Sitting balance - Comments: min guard to supervision   Standing balance support: Bilateral upper extremity supported, During functional activity Standing balance-Leahy Scale: Poor                             ADL either performed or assessed with clinical judgement   ADL Overall ADL's : Needs assistance/impaired     Grooming: Set up;Sitting  Upper Body Dressing : Set up;Sitting   Lower Body Dressing: Minimal assistance;Sit to/from Health and safety inspector Details (indicate cue type and  reason): sit to stand from recliner, transfer not completed today but antiipcate min assist         Functional mobility during ADLs: Minimal assistance;Rolling walker (2 wheels)       Vision Baseline Vision/History: 1 Wears glasses Vision Assessment?: No apparent visual deficits     Perception     Praxis      Pertinent Vitals/Pain Pain Assessment Pain Assessment: Faces Faces Pain Scale: Hurts a little bit Pain Location: RLE Pain Descriptors / Indicators: Grimacing, Guarding Pain Intervention(s): Limited activity within patient's tolerance, Monitored during session, Repositioned     Hand Dominance Right   Extremity/Trunk Assessment Upper Extremity Assessment Upper Extremity Assessment: Generalized weakness   Lower Extremity Assessment Lower Extremity Assessment: Defer to PT evaluation       Communication Communication Communication: No difficulties   Cognition Arousal/Alertness: Awake/alert Behavior During Therapy: WFL for tasks assessed/performed Overall Cognitive Status: Impaired/Different from baseline Area of Impairment: Attention, Memory, Following commands, Safety/judgement, Problem solving, Awareness                   Current Attention Level: Sustained Memory: Decreased short-term memory, Decreased recall of precautions Following Commands: Follows one step commands consistently, Follows one step commands with increased time, Follows multi-step commands inconsistently Safety/Judgement: Decreased awareness of deficits, Decreased awareness of safety Awareness: Emergent Problem Solving: Slow processing, Decreased initiation, Difficulty sequencing, Requires verbal cues General Comments: patient with increased difficulty with recall of birthday (several attempts required to correctly voice birthday), looking frequently to spouse to confirm due to mild confusion.  He is pleasant and cooperative throughout session. Spouse reports noticing cognitive decline  today as well.     General Comments  Spo2 stable on RA. BP montiored, blurry vision in standing and transitioned back to sitting. HR max 162 with limited activity    Exercises     Shoulder Instructions      Home Living Family/patient expects to be discharged to:: Private residence Living Arrangements: Spouse/significant other Available Help at Discharge: Family;Available 24 hours/day Type of Home: House Home Access: Stairs to enter [employer] of Steps: 3 Entrance Stairs-Rails: Right Home Layout: Two level;Bed/bath upstairs;1/2 bath on main level Alternate Level Stairs-Number of Steps: 14 Alternate Level Stairs-Rails: Left Bathroom Shower/Tub: Teacher, early years/pre: Standard     Home Equipment: Conservation officer, nature (2 wheels);Cane - single point;Cane - quad;BSC/3in1;Wheelchair - manual;Shower seat;Grab bars - tub/shower          Prior Functioning/Environment Prior Level of Function : Independent/Modified Independent;Driving             Mobility Comments: pt was independent with mobility with SPC however has been utilizing a RW recently with wife providing supervision after most recent hospitalization ADLs Comments: reports independent with ADLs        OT Problem List: Decreased strength;Decreased activity tolerance;Impaired balance (sitting and/or standing);Decreased cognition;Decreased safety awareness;Decreased knowledge of use of DME or AE;Decreased knowledge of precautions;Cardiopulmonary status limiting activity      OT Treatment/Interventions: Self-care/ADL training;Therapeutic exercise;DME and/or AE instruction;Therapeutic activities;Patient/family education;Balance training;Cognitive remediation/compensation    OT Goals(Current goals can be found in the care plan section) Acute Rehab OT Goals Patient Stated Goal: home OT Goal Formulation: With patient Time For Goal Achievement: 05/20/22 Potential to Achieve Goals: Good  OT Frequency: Min  2X/week    Co-evaluation  AM-PAC OT "6 Clicks" Daily Activity     Outcome Measure Help from another person eating meals?: None Help from another person taking care of personal grooming?: A Little Help from another person toileting, which includes using toliet, bedpan, or urinal?: A Lot Help from another person bathing (including washing, rinsing, drying)?: A Lot Help from another person to put on and taking off regular upper body clothing?: A Little Help from another person to put on and taking off regular lower body clothing?: A Little 6 Click Score: 17   End of Session Equipment Utilized During Treatment: Gait belt;Rolling walker (2 wheels) Nurse Communication: Mobility status;Other (comment) (HR, cog)  Activity Tolerance: Patient tolerated treatment well Patient left: in chair;with call bell/phone within reach;with chair alarm set;with family/visitor present  OT Visit Diagnosis: Other abnormalities of gait and mobility (R26.89);Muscle weakness (generalized) (M62.81);Other symptoms and signs involving cognitive function                Time: 1032-1056 OT Time Calculation (min): 24 min Charges:  OT General Charges $OT Visit: 1 Visit OT Evaluation $OT Eval Moderate Complexity: 1 Mod OT Treatments $Self Care/Home Management : 8-22 mins  Jolaine Artist, OT Acute Rehabilitation Services Office [phone]   Delight Stare 05/06/2022, 1:38 PM

## 2022-05-06 NOTE — Progress Notes (Signed)
Physical Therapy Treatment Patient Details Name: [name] MRN: [id number] DOB: Oct 19, 1945 Today's Date: 05/06/2022   History of Present Illness 77 y/o M admitted to [facility] on 2/18 with complaints of wound infection and LOC. Found with upper GI bleed and acute blood loss anemia. Pt recently admitted to [place] from 2/12-2/15 fir cellulitis of BLE. 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    PT Comments    Pt tolerated today's session well, limited mildly by orthostatics but able to ambulate in room with RW after seated TherEx to increase BP prior to ambulation. BP in sitting 115/71(84) dropping to 82/50(60) in standing, increasing mildly to 88/75(81) after returning to sitting. Seated TherEx performed and pt's BP increasing to 112/67(82) in standing, ambulating to chair and upon return to sitting BP 99/70(76). Pt minimally symptomatic throughout session, encouraged pt to sit in chair to increase upright tolerance, RN notified. Pt will continue to benefit from skilled acute PT to progress mobility, requiring minA for sit<>stand trials with RW and minG for safety with ambulation. Discharge plan remains appropriate.     Recommendations for follow up therapy are one component of a multi-disciplinary discharge planning process, led by the attending physician.  Recommendations may be updated based on patient status, additional functional criteria and insurance authorization.  Follow Up Recommendations  Home health PT     Assistance Recommended at Discharge Intermittent Supervision/Assistance  Patient can return home with the following Help with stairs or ramp for entrance;Assist for transportation;A little help with walking and/or transfers;Assistance with cooking/housework   Equipment Recommendations  None  recommended by PT    Recommendations for Other Services       Precautions / Restrictions Precautions Precautions: Fall Precaution Comments: watch BP Restrictions Weight Bearing Restrictions: No     Mobility  Bed Mobility Overal bed mobility: Needs Assistance Bed Mobility: Supine to Sit     Supine to sit: Min guard, HOB elevated     General bed mobility comments: minG for safety with supine>sit, ended session with pt in chair    Transfers Overall transfer level: Needs assistance Equipment used: Rolling walker (2 wheels) Transfers: Sit to/from Stand Sit to Stand: Min assist           General transfer comment: cueing for hand placement and safety, min assist to power up, improving with repetition but remaining minA for safety and balance    Ambulation/Gait Ambulation/Gait assistance: Min guard Gait Distance (Feet): 15 Feet Assistive device: Rolling walker (2 wheels) Gait Pattern/deviations: Step-through pattern, Trunk flexed, Narrow base of support, Decreased stride length Gait velocity: decreased     General Gait Details: ambulated around bed, downward gaze and increased trunk flexion noted, cued for upright posture   Stairs             Wheelchair Mobility    Modified Rankin (Stroke Patients Only)       Balance Overall balance assessment: Needs assistance Sitting-balance support: No upper extremity supported, Feet supported Sitting balance-Leahy Scale: Fair Sitting balance - Comments: stable static sitting   Standing balance support: Bilateral upper extremity supported, During functional activity Standing balance-Leahy Scale: Poor Standing balance comment: reliant on RW for standing and ambulation                            Cognition Arousal/Alertness: Awake/alert Behavior During Therapy: [facility]  for tasks assessed/performed Overall Cognitive Status: Impaired/Different from baseline Area of Impairment: Following commands,  Safety/judgement, Problem solving                       Following Commands: Follows one step commands consistently, Follows one step commands with increased time Safety/Judgement: Decreased awareness of deficits   Problem Solving: Requires verbal cues General Comments: pt pleasant throughout, talkative during today's session, voicing if symptomatic with mobility        Exercises General Exercises - Lower Extremity Ankle Circles/Pumps: AROM, Both, Seated, 20 reps Long Arc Quad: AROM, Both, Seated, 20 reps Hip Flexion/Marching: AROM, Both, Seated, 20 reps    General Comments General comments (skin integrity, edema, etc.): SPO2 stable on room air, BP dropping from 115/71 in sitting to 82/50 in standing. Returned to sitting and performed BLE TherEx, increasing to 112/67 with second trial of standing, ambualting and BP reading 99/70 after ambulation once seated, pt minimally symptomatic      Pertinent Vitals/Pain Pain Assessment Pain Assessment: Faces Faces Pain Scale: Hurts a little bit Pain Location: RLE Pain Descriptors / Indicators: Grimacing, Guarding Pain Intervention(s): Monitored during session, Repositioned, Limited activity within patient's tolerance    Home Living Family/patient expects to be discharged to:: Private residence Living Arrangements: Spouse/significant other Available Help at Discharge: Family;Available 24 hours/day Type of Home: House Home Access: Stairs to enter Entrance Stairs-Rails: Right Entrance Stairs-Number of Steps: 3 Alternate Level Stairs-Number of Steps: 14 Home Layout: Two level;Bed/bath upstairs;1/2 bath on main level Home Equipment: Rolling Walker (2 wheels);Cane - single point;Cane - quad;BSC/3in1;Wheelchair - manual;Shower seat;Grab bars - tub/shower      Prior Function            PT Goals (current goals can now be found in the care plan section) Acute Rehab PT Goals Patient Stated Goal: go home PT Goal Formulation: With  patient Time For Goal Achievement: 05/19/22 Potential to Achieve Goals: Good Progress towards PT goals: Progressing toward goals    Frequency    Min 3X/week      PT Plan Current plan remains appropriate    Co-evaluation              AM-PAC PT "6 Clicks" Mobility   Outcome Measure  Help needed turning from your back to your side while in a flat bed without using bedrails?: A Little Help needed moving from lying on your back to sitting on the side of a flat bed without using bedrails?: A Little Help needed moving to and from a bed to a chair (including a wheelchair)?: A Little Help needed standing up from a chair using your arms (e.g., wheelchair or bedside chair)?: A Little Help needed to walk in hospital room?: A Little Help needed climbing 3-5 steps with a railing? : A Lot 6 Click Score: 17    End of Session Equipment Utilized During Treatment: Gait belt Activity Tolerance: Patient tolerated treatment well Patient left: with call bell/phone within reach;in chair;with chair alarm set Nurse Communication: Mobility status PT Visit Diagnosis: Muscle weakness (generalized) (M62.81);Difficulty in walking, not elsewhere classified (R26.2)     Time: [id number] PT Time Calculation (min) (ACUTE ONLY): 29 min  Charges:  $Gait Training: 8-22 mins $Therapeutic Activity: 8-22 mins                     Charlynne Cousins, PT DPT Acute Rehabilitation Services Office [phone]    Luvenia Heller 05/06/2022, 4:09 PM

## 2022-05-06 NOTE — TOC Initial Note (Signed)
Transition of Care [facility]) - Initial/Assessment Note    Patient Details  Name: [name] MRN: [id number] Date of Birth: 01/30/1946  [facility]) CM/SW Contact:    Levonne Lapping, RN Phone Number: 05/06/2022, 3:02 PM  Clinical Narrative:        CM met with  patient and his Wife in room. Patient was receiving  care on legs from RN so CM made the visit brief.  CM discussed DC plan to home with home health. Patient had been referred to [facility] when [place] but ended up here. Patient would like to resume/begin  services with [facility]. Liaison has been contacted with information. No DME is needed. TOC will continue to follow patient for any additional discharge needs      Expected Discharge Plan: [place] Barriers to Discharge: Continued Medical Work up   Patient Goals and CMS Choice Patient states their goals for this hospitalization and ongoing recovery are:: go home CMS Medicare.gov Compare Post Acute Care list provided to:: Other (Comment Required) (Patient already current with [facility]) Choice offered to / list presented to : NA (Current with [facility])      Expected Discharge Plan and Services In-house Referral: NA Discharge Planning Services: CM Consult Post Acute Care Choice: [place] arrangements for the past 2 months: Single Family Home                 DME Arranged: N/A DME Agency: NA       HH Arranged: PT, OT HH Agency: [place] Date [facility] Agency Contacted: 05/06/22 Time HH Agency Contacted: 1501 Representative spoke with at [place]: Tommi Rumps  Prior Living Arrangements/Services Living arrangements for the past 2 months: Real with:: Spouse Patient language and need for interpreter reviewed:: Yes Do you feel safe going back to the place where you live?: Yes      Need for Family Participation in Patient Care: No (Comment) Care giver support system in place?: Yes (comment) (Wife)   Criminal  Activity/Legal Involvement Pertinent to Current Situation/Hospitalization: No - Comment as needed  Activities of Daily Living   ADL Screening (condition at time of admission) Patient's cognitive ability adequate to safely complete daily activities?: Yes Is the patient deaf or have difficulty hearing?: No Does the patient have difficulty seeing, even when wearing glasses/contacts?: No Does the patient have difficulty concentrating, remembering, or making decisions?: No Patient able to express need for assistance with ADLs?: Yes Does the patient have difficulty dressing or bathing?: No Independently performs ADLs?: Yes (appropriate for developmental age) Does the patient have difficulty walking or climbing stairs?: No Weakness of Legs: Left Weakness of Arms/Hands: None  Permission Sought/Granted Permission sought to share information with : Case Manager       Permission granted to share info w AGENCY: Home Health        Emotional Assessment Appearance:: Appears younger than stated age Attitude/Demeanor/Rapport: Gracious Affect (typically observed): Appropriate, Calm Orientation: : Oriented to Self, Oriented to Place, Oriented to  Time, Oriented to Situation Alcohol / Substance Use: Not Applicable Psych Involvement: No (comment)  Admission diagnosis:  Fecal impaction ([place]) [K56.41] Acute GI bleeding [K92.2] Symptomatic anemia [D64.9] Patient Active Problem List   Diagnosis Date Noted   Duodenal ulcer 05/04/2022   Symptomatic anemia 05/03/2022   Leukocytosis 05/03/2022   Supratherapeutic INR 05/03/2022   Persistent atrial fibrillation ([place]) 05/03/2022   Leukocytoclastic vasculitis ([place]) 05/03/2022   Lesion of lip  03/24/2022   Cough 03/18/2022   Hematoma of thigh 12/29/2021   Cellulitis and abscess of leg 06/26/2021   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   Health maintenance alteration 06/26/2021   History of cholecystectomy 06/26/2021   Neck  pain 06/26/2021   Peripheral edema 06/26/2021   BPH (benign prostatic hyperplasia) 06/26/2021   COVID-19 04/11/2021   Sleep apnea 11/25/2020   GERD (gastroesophageal reflux disease) 11/20/2020   Stasis dermatitis 10/29/2020   Spinal stenosis 09/26/2020   Memory loss 09/26/2020   Chronic kidney disease 08/28/2020   Back pain 08/28/2020   Hyperkalemia 08/28/2016   Hyperkalemia, diminished renal excretion 08/27/2016   Controlled type 2 diabetes mellitus without complication, without long-term current use of insulin ([place]) 08/27/2016   Dysphagia 08/27/2016   Acute kidney injury superimposed on CKD ([place]) 08/27/2016   Gout 08/27/2016   Mitral insufficiency 09/23/2011   Hypertension    Permanent atrial fibrillation (HCC)    Chronic anticoagulation    Pulmonary hypertension ([place])    Hyperlipidemia    PCP:  de [place], Raymond J, MD Pharmacy:   [facility] [id number] - [place], [address] [address] [place] [address] Phone: [phone] [phone]     Social Determinants of Health (SDOH) Social History: SDOH Screenings   Depression (PHQ2-9): Low Risk  (03/24/2022)  Tobacco Use: Low Risk  (05/04/2022)   SDOH Interventions:     Readmission Risk Interventions     No data to display

## 2022-05-06 NOTE — Care Management Important Message (Signed)
Important Message  Patient Details  Name: [name] MRN: [id number] Date of Birth: [birth year]   Medicare Important Message Given:  Yes     [name] 05/06/2022, 2:11 PM

## 2022-05-06 NOTE — Progress Notes (Addendum)
ANTICOAGULATION CONSULT NOTE - Initial Consult  Pharmacy Consult for apixaban Indication: atrial fibrillation  Allergies  Allergen Reactions   Atorvastatin Other (See Comments)    myalgia   Lisinopril     Other reaction(s): Hyperkalemia, Renal impairment    Patient Measurements: Height: 6' 3"$  (190.5 cm) Weight: 96.6 kg (213 lb) IBW/kg (Calculated) : 84.5  Vital Signs: Temp: 98 F (36.7 C) (02/21 0743) Temp Source: Oral (02/21 0743) BP: 115/66 (02/21 0743) Pulse Rate: 103 (02/21 0743)  Labs: Recent Labs    05/04/22 0046 05/04/22 0420 05/04/22 1753 05/04/22 2115 05/05/22 0440 05/06/22 0442  HGB 7.2*   < > 7.8* 8.7* 8.7* 8.0*  HCT 21.9*   < > 23.0* 26.0* 26.2* 24.8*  PLT 131*   < > 119*  --  126* 140*  LABPROT 16.7*  --   --   --  16.9*  --   INR 1.4*  --   --   --  1.4*  --   CREATININE 1.96*  --   --   --  1.89*  --    < > = values in this interval not displayed.    Estimated Creatinine Clearance: 40.4 mL/min (A) (by C-G formula based on SCr of 1.89 mg/dL (H)).   Medical History: Past Medical History:  Diagnosis Date   Atrial fibrillation (HCC)    Chronic anticoagulation    Complication of anesthesia    ' hard time waking up" per patient    Diabetes mellitus    type 2   GERD (gastroesophageal reflux disease)    Hyperlipidemia    Hypertension    Mitral regurgitation    a. 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HCC)    chronic atrial fib   Pulmonary hypertension (HCC)    Pulmonary hypertension (HCC)     Medications:  Medications Prior to Admission  Medication Sig Dispense Refill Last Dose   allopurinol (ZYLOPRIM) 100 MG tablet TAKE 1 TABLET BY MOUTH TWICE A DAY 120 tablet 0 05/02/2022 at pm   cefdinir (OMNICEF) 300 MG capsule Take 300 mg by mouth 2 (two) times daily.   05/02/2022 at pm   DULoxetine (CYMBALTA) 20 MG capsule Take 1 capsule (20 mg total) by mouth daily. 30 capsule  1 05/02/2022   famotidine (PEPCID) 20 MG tablet TAKE 1 TABLET BY MOUTH DAILY - KEEP APPOINTMENT FOR FURTHER REFILLS (Patient taking differently: Take 20 mg by mouth daily.) 90 tablet 3 05/02/2022   furosemide (LASIX) 20 MG tablet Take 20 mg by mouth every other day.   Past Week   gabapentin (NEURONTIN) 100 MG capsule Take 1 capsule (100 mg total) by mouth 3 (three) times daily. 90 capsule 0 05/02/2022 at pm   lidocaine (LIDODERM) 5 % Place 1 patch onto the skin as needed. Remove & Discard patch within 12 hours or as directed by MD   Past Week   metoprolol tartrate (LOPRESSOR) 50 MG tablet TAKE 1 TABLET BY MOUTH TWICE A DAY 180 tablet 0 05/02/2022 at 2030   oxyCODONE (OXY IR/ROXICODONE) 5 MG immediate release tablet Take 5 mg by mouth 3 (three) times daily as needed.      predniSONE (DELTASONE) 10 MG tablet Take 5 mg by mouth daily with breakfast.   05/02/2022   simvastatin (ZOCOR) 20 MG tablet Take 1 tablet (20 mg total) by mouth at bedtime. 90 tablet 0 05/02/2022 at pm   tamsulosin (FLOMAX)  0.4 MG CAPS capsule Take 0.4 mg by mouth daily.   05/01/2022   acetaminophen (TYLENOL) 500 MG tablet Take 1,000 mg by mouth as needed for mild pain.      Glucose Blood (FREESTYLE LITE TEST VI)       silver sulfADIAZINE (SILVADENE) 1 % cream Apply topically. (Patient not taking: Reported on 05/03/2022)   Not Taking   triamcinolone ointment (KENALOG) 0.1 % Apply topically. (Patient not taking: Reported on 05/03/2022)   Not Taking   warfarin (COUMADIN) 1 MG tablet TAKE 1 TABLET BY MOUTH ONCE WEEKLY IN ADDITION TO 2 MG (Patient not taking: Reported on 05/03/2022) 5 tablet 1 Not Taking   warfarin (COUMADIN) 2 MG tablet TAKE ONE TABLET BY MOUTH EVERY EVENING (Patient not taking: Reported on 05/03/2022) 30 tablet 1 Not Taking    Assessment: Purvis Kilts is a 63yoM who presented with acute blood loss anemia d/t upper GI bleeding. Patient has history of afib on chronic warfarin prior to admission. Last dose of warfarin PTA  was 2/10, however INR was elevated on admission on 2/12 and received 92m of IV Vitamin K. INR came down on 2/13 to 2.07 and patient did receive 167mwarfarin this day. Warfarin has been held since. Patient received FFP and PRBC x 3, as well as another 5 mg of IV vitK 2/18-19 and INR has come down to < 2 on 2/19. Seems GI bleeding has resolved and pharmacy consulted to start apixaban. CBC stable. No new bleeding reported.   Goal of Therapy:  Anticoagulation Monitor platelets by anticoagulation protocol: Yes   Plan:  Start apixaban 5 mg PO BID Monitor for signs and symptoms of bleeding    Thank you for allowing [place] participate in this patients care. ToJens SomPharmD 05/06/2022 7:57 AM  **Pharmacist phone directory can be found on amSimpsonom listed under [email or web address]

## 2022-05-07 ENCOUNTER — Encounter: Payer: Self-pay | Admitting: Cardiology

## 2022-05-07 ENCOUNTER — Other Ambulatory Visit: Payer: Self-pay

## 2022-05-07 DIAGNOSIS — I10 Essential (primary) hypertension: Secondary | ICD-10-CM | POA: Diagnosis not present

## 2022-05-07 DIAGNOSIS — I872 Venous insufficiency (chronic) (peripheral): Secondary | ICD-10-CM | POA: Diagnosis not present

## 2022-05-07 DIAGNOSIS — D649 Anemia, unspecified: Secondary | ICD-10-CM | POA: Diagnosis not present

## 2022-05-07 DIAGNOSIS — K922 Gastrointestinal hemorrhage, unspecified: Secondary | ICD-10-CM | POA: Diagnosis not present

## 2022-05-07 LAB — CBC
HCT: 22.8 % — ABNORMAL LOW (ref 39.0–52.0)
HCT: 24 % — ABNORMAL LOW (ref 39.0–52.0)
Hemoglobin: 7.5 g/dL — ABNORMAL LOW (ref 13.0–17.0)
Hemoglobin: 7.7 g/dL — ABNORMAL LOW (ref 13.0–17.0)
MCH: 31.8 pg (ref 26.0–34.0)
MCH: 31.6 pg (ref 26.0–34.0)
MCHC: 32.9 g/dL (ref 30.0–36.0)
MCHC: 32.1 g/dL (ref 30.0–36.0)
MCV: 96.6 fL (ref 80.0–100.0)
MCV: 98.4 fL (ref 80.0–100.0)
Platelets: 141 10*3/uL — ABNORMAL LOW (ref 150–400)
Platelets: 152 10*3/uL (ref 150–400)
RBC: 2.36 MIL/uL — ABNORMAL LOW (ref 4.22–5.81)
RBC: 2.44 MIL/uL — ABNORMAL LOW (ref 4.22–5.81)
RDW: 19.2 % — ABNORMAL HIGH (ref 11.5–15.5)
RDW: 18.8 % — ABNORMAL HIGH (ref 11.5–15.5)
WBC: 9.6 10*3/uL (ref 4.0–10.5)
WBC: 10.2 10*3/uL (ref 4.0–10.5)
nRBC: 0 % (ref 0.0–0.2)
nRBC: 0 % (ref 0.0–0.2)

## 2022-05-07 LAB — GLUCOSE, CAPILLARY
Glucose-Capillary: 183 mg/dL — ABNORMAL HIGH (ref 70–99)
Glucose-Capillary: 194 mg/dL — ABNORMAL HIGH (ref 70–99)
Glucose-Capillary: 221 mg/dL — ABNORMAL HIGH (ref 70–99)

## 2022-05-07 LAB — BASIC METABOLIC PANEL
Anion gap: 3 — ABNORMAL LOW (ref 5–15)
BUN: 53 mg/dL — ABNORMAL HIGH (ref 8–23)
CO2: 26 mmol/L (ref 22–32)
Calcium: 7.9 mg/dL — ABNORMAL LOW (ref 8.9–10.3)
Chloride: 107 mmol/L (ref 98–111)
Creatinine, Ser: 1.62 mg/dL — ABNORMAL HIGH (ref 0.61–1.24)
GFR, Estimated: 44 mL/min — ABNORMAL LOW (ref >60)
Glucose, Bld: 154 mg/dL — ABNORMAL HIGH (ref 70–99)
Potassium: 3.9 mmol/L (ref 3.5–5.1)
Sodium: 136 mmol/L (ref 135–145)

## 2022-05-07 MED ORDER — ADULT MULTIVITAMIN W/MINERALS CH
1.0000 | ORAL_TABLET | Freq: Every day | ORAL | Status: DC
  Administered 2022-05-07 – 2022-05-10 (×4): 1 via ORAL
  Filled 2022-05-07 (×4): qty 1

## 2022-05-07 MED ORDER — SODIUM CHLORIDE 0.9 % IV BOLUS
500.0000 mL | Freq: Once | INTRAVENOUS | Status: AC
  Administered 2022-05-07: 500 mL via INTRAVENOUS

## 2022-05-07 MED ORDER — ENSURE ENLIVE PO LIQD
237.0000 mL | Freq: Two times a day (BID) | ORAL | Status: DC
  Administered 2022-05-07 – 2022-05-10 (×6): 237 mL via ORAL

## 2022-05-07 MED ORDER — MIDODRINE HCL 5 MG PO TABS
10.0000 mg | ORAL_TABLET | Freq: Three times a day (TID) | ORAL | Status: DC
  Administered 2022-05-07 – 2022-05-10 (×11): 10 mg via ORAL
  Filled 2022-05-07 (×11): qty 2

## 2022-05-07 NOTE — Progress Notes (Signed)
Initial Nutrition Assessment  DOCUMENTATION CODES:   Not applicable  INTERVENTION:   Multivitamin w/ minerals daily Ensure Enlive po BID, each supplement provides 350 kcal and 20 grams of protein. Encourage good PO intake  Referral to [facility] for outpatient education  NUTRITION DIAGNOSIS:   Increased nutrient needs related to acute illness, wound healing as evidenced by estimated needs.  GOAL:   Patient will meet greater than or equal to 90% of their needs  MONITOR:   PO intake, Supplement acceptance, Labs, I & O's, Skin  REASON FOR ASSESSMENT:   Malnutrition Screening Tool    ASSESSMENT:   77 y.o. male presented to the ED with loss of consciousness. PMH includes A. Fib, HTN, HLD, GERD, T2DM, gout, and BPH. Pt admitted with symptomatic anemia 2/2 upper GI bleed and AKI on CKD.   2/19 - EGD; diet advanced to clear liquids 2/20 - GI signed-off; diet advanced to HH/CM  RD working remotely at time of assessment. Discussed case with RN, reports that he did not have an appetite for breakfast this morning. Was unsure how he ate yesterday but shared that the pt reported that he ate well yesterday.  RD chatted with pt over the phone, pt reports that he typically has a pretty good appetite at home. Reports that he is aware of the foods that he is eating at home, wife does a lot of his cooking for him. Reports that he avoids breads and cookies. Will eat not much for breakfast, if he has a sandwich he just eats the meat and vegetables off it does not eat the bread. Will drink 1/2-1 protein shake daily for the past 2-3 months as well. Reports that he has lost ~20# and was happy about that and decreasing his pant size. Shares that he then "drifted off" and wasn't as cautious with his eating. Shares that he likes ice cream and Cheetos. Discussed pt not eating much this morning, reports that he was dizzy with the MD in room and decided to just take it easy today. Also being aware of what he is  putting in due to not being as active while in the hospital. RD discussed the importance of proper nutrition to maintain lean muscle mass. Pt agreeable to RD ordering oral nutrition supplement while admitted. RD also discussed pt receiving additional education regarding diet outpatient; pt agreeable to RD placing referral for outpatient education.   Per EMR, it appears that pt has had some weight loss over the past year. Although, his current weight appears to be stated and carried from previous weight in chart.    Medications reviewed and include: NovoLog SSI, Protonix, Prednisone  Labs reviewed: Sodium 136, Potassium 3.9, BUN 53, Creatinine 1.62, Hgb A1c 6.7% (03/24/22), 24 hr CBGs 158-258  NUTRITION - FOCUSED PHYSICAL EXAM:  Deferred to follow-up.   Diet Order:   Diet Order             Diet heart healthy/carb modified Room service appropriate? Yes; Fluid consistency: Thin  Diet effective now                   EDUCATION NEEDS:   Education needs have been addressed  Skin:  Skin Assessment: Reviewed RN Assessment (Multiple Non-pressure injuries)  Last BM:  2/21 - Type 5, smear, black  Height:  Ht Readings from Last 1 Encounters:  05/03/22 6' 3"$  (1.905 m)   Weight:  Wt Readings from Last 1 Encounters:  05/03/22 96.6 kg    Ideal Body  Weight:  89.1 kg  BMI:  Body mass index is 26.62 kg/m.  Estimated Nutritional Needs:  Kcal:  [id number] Protein:  110-130 grams Fluid:  >/= 2 L   Hermina Barters RD, LDN Clinical Dietitian See [facility] for contact information.

## 2022-05-07 NOTE — Progress Notes (Addendum)
PROGRESS NOTE        PATIENT DETAILS Name: [name] Age: 77 y.o. Sex: male Date of Birth: April 18, 1945 Admit Date: 05/03/2022 Admitting Physician Norval Morton, MD PCP:de [place], Raymond J, MD  Brief Summary: Patient is a 77 y.o.  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  Significant events: 2/18>> admit to [facility]  Significant studies: 2/18>> CXR: No PNA  Significant microbiology data: None  Procedures: 2/19>> EGD: Few gastric polyps, nonbleeding duodenal ulcer 2/20>> echo: EF 70-75%, RVSP 77.3  Consults: GI  Subjective: No BM yet today.  His last BM was the day before-it was brown.  He still feels dizzy when sitting up-blood pressure still dropping significantly.  Objective: Vitals: Blood pressure 112/72, pulse 72, temperature 97.8 F (36.6 C), resp. rate 19, height 6' 3"$  (1.905 m), weight 96.6 kg, SpO2 98 %.   Exam: Gen Exam:Alert awake-not in any distress HEENT:atraumatic, normocephalic Chest: B/L clear to auscultation anteriorly CVS:S1S2 regular Abdomen:soft non tender, non distended Extremities:no edema Neurology: Non focal Skin: no rash  Pertinent Labs/Radiology:    Latest Ref Rng & Units 05/07/2022    4:42 AM 05/06/2022    4:42 AM 05/05/2022    4:40 AM  CBC  WBC 4.0 - 10.5 K/uL 9.6  10.4  11.9   Hemoglobin 13.0 - 17.0 g/dL 7.5  8.0  8.7   Hematocrit 39.0 - 52.0 % 22.8  24.8  26.2   Platelets 150 - 400 K/uL 141  140  126     Lab Results  Component Value Date   NA 136 05/07/2022   K 3.9 05/07/2022   CL 107 05/07/2022   CO2 26 05/07/2022      Assessment/Plan: Upper GI bleeding with acute blood loss anemia secondary to  duodenal ulcer Brown stools on 2/20-no BM since then Hemoglobin slightly decreased today-suspect this could be from equilibration and from IV  fluid that he got due to orthostatic hypotension. He remains orthostatic but there is no overt evidence of recurrent GI bleeding Has required a total of 4 units of PRBC so far Continue PPI EGD biopsy negative for H. pylori/malignancy Continue PPI infusion Follow CBC daily GI recommending second look EGD in 6 to 8 weeks Eliquis has been resumed 2/21  Syncope Due to orthostatic hypotension in the setting of GI bleeding Telemetry with stable atrial fibrillation Echo with stable EF  Orthostatic hypotension Continues to be persistently orthostatic Flomax remains on hold Increase midodrine to 64m 3 times daily 500 cc of normal saline bolus today As noted above-no obvious/overt recurrence of GI bleeding I have asked RN to see if we can get in touch with wound care team to see if we can use compression dressing with his wounds.   AKI on CKD stage IIIb AKI due to GI bleeding/hemodynamically mediated Improved-creatinine close to baseline Continue to avoid nephrotoxic agents.   Hyperkalemia Resolved with Lokelma and improvement in renal function. Follow electrolytes periodically.  Chronic atrial fibrillation Rate controlled On Coumadin prior to this hospitalization-with plans to transition to Eliquis when INR was subtherapeutic.  He was not on Coumadin since 2/10-INR was still therapeutic on initial presentation-likely due to interaction with cefdinir.  He required 1 dose of vitamin K on 2/18. GI bleeding  seems to have resolved-has been switched to Eliquis as planned on 2/21.    HTN BP stable at rest-but has severe orthostatic hypotension-see above  DM-2 (A1c 6.7 on 1/9) CBGs stable with SSI Continue to hold all oral hypoglycemics.   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   Weight as of this encounter: 96.6 kg.   Code status:   Code Status: Full Code   DVT Prophylaxis: Place TED hose Start: 05/05/22 1126 SCDs Start: 05/03/22 1042 apixaban (ELIQUIS) tablet 5 mg    Family Communication:Spouse-Fran Rueter-[phone] -updated 2/22   Disposition Plan: Status is: Inpatient Remains inpatient appropriate because: Severity of illness   Planned Discharge Destination:Home   Diet: Diet Order             Diet heart healthy/carb modified Room service appropriate? Yes; Fluid consistency: Thin  Diet effective now                     Antimicrobial agents: Anti-infectives (From admission, onward)    Start     Dose/Rate Route Frequency Ordered Stop   05/03/22 2200  cefdinir (OMNICEF) capsule 300 mg  Status:  Discontinued        300 mg Oral 2 times daily 05/03/22 1900 05/06/22 1051        MEDICATIONS: Scheduled Meds:  sodium chloride   Intravenous Once   sodium chloride   Intravenous Once   apixaban  5 mg Oral BID   DULoxetine  20 mg Oral Daily   gabapentin  100 mg Oral TID   insulin aspart  0-5 Units Subcutaneous QHS   insulin aspart  0-9 Units Subcutaneous TID WC   midodrine  10 mg Oral TID WC   pantoprazole  40 mg Intravenous Q12H   predniSONE  10 mg Oral Q breakfast   silver sulfADIAZINE   Topical BID   simvastatin  20 mg Oral QHS   sodium chloride flush  3 mL Intravenous Q12H   Continuous Infusions:   PRN Meds:.acetaminophen **OR** acetaminophen, albuterol, lidocaine, oxyCODONE   I have personally reviewed following  labs and imaging studies  LABORATORY DATA: CBC: Recent Labs  Lab 05/03/22 0755 05/04/22 0046 05/04/22 0718 05/04/22 1753 05/04/22 2115 05/05/22 0440 05/06/22 0442 05/07/22 0442  WBC 17.6*   < > 13.6* 12.0*  --  11.9* 10.4 9.6  NEUTROABS 15.3*  --   --   --   --   --   --   --   HGB 6.6*   < > 6.8* 7.8* 8.7* 8.7* 8.0* 7.5*  HCT 20.1*   < > 19.8* 23.0* 26.0* 26.2* 24.8* 22.8*  MCV 98.5   < > 94.3 92.4  --  93.9 96.5 96.6  PLT 151   < > 127* 119*  --  126* 140* 141*   < > = values in this interval not displayed.     Basic Metabolic Panel: Recent Labs  Lab 05/03/22 0755 05/04/22 0046 05/05/22  0440 05/07/22 0442  NA 132* 136 139 136  K 5.6* 5.2* 4.0 3.9  CL 105 108 108 107  CO2 18* 21* 24 26  GLUCOSE 258* 186* 163* 154*  BUN 139* 132* 94* 53*  CREATININE 2.30* 1.96* 1.89* 1.62*  CALCIUM 8.6* 8.5* 8.2* 7.9*  MG  --  2.3  --   --      GFR: Estimated Creatinine Clearance: 47.1 mL/min (A) (by C-G formula based on SCr of 1.62 mg/dL (H)).  Liver Function Tests: Recent Labs  Lab 05/03/22 0755  AST 20  ALT 21  ALKPHOS 52  BILITOT 0.2*  PROT 4.7*  ALBUMIN 1.9*    No results for input(s): "LIPASE", "AMYLASE" in the last 168 hours. No results for input(s): "AMMONIA" in the last 168 hours.  Coagulation Profile: Recent Labs  Lab 05/01/22 0818 05/03/22 0755 05/04/22 0046 05/05/22 0440  INR 3.4* 3.2* 1.4* 1.4*     Cardiac Enzymes: No results for input(s): "CKTOTAL", "CKMB", "CKMBINDEX", "TROPONINI" in the last 168 hours.  BNP (last 3 results) No results for input(s): "PROBNP" in the last 8760 hours.  Lipid Profile: No results for input(s): "CHOL", "HDL", "LDLCALC", "TRIG", "CHOLHDL", "LDLDIRECT" in the last 72 hours.  Thyroid Function Tests: No results for input(s): "TSH", "T4TOTAL", "FREET4", "T3FREE", "THYROIDAB" in the last 72 hours.  Anemia Panel: No results for input(s): "VITAMINB12", "FOLATE", "FERRITIN", "TIBC", "IRON", "RETICCTPCT" in the last  72 hours.  Urine analysis:    Component Value Date/Time   COLORURINE YELLOW 05/03/2022 0745   APPEARANCEUR CLEAR 05/03/2022 0745   LABSPEC 1.015 05/03/2022 0745   PHURINE 5.5 05/03/2022 0745   GLUCOSEU NEGATIVE 05/03/2022 0745   HGBUR SMALL (A) 05/03/2022 0745   BILIRUBINUR NEGATIVE 05/03/2022 0745   KETONESUR NEGATIVE 05/03/2022 0745   PROTEINUR NEGATIVE 05/03/2022 0745   NITRITE NEGATIVE 05/03/2022 0745   LEUKOCYTESUR NEGATIVE 05/03/2022 0745    Sepsis Labs: Lactic Acid, Venous No results found for: "LATICACIDVEN"  MICROBIOLOGY: No results found for this or any previous visit (from the past 240 hour(s)).  RADIOLOGY STUDIES/RESULTS: ECHOCARDIOGRAM COMPLETE  Result Date: 05/05/2022    ECHOCARDIOGRAM REPORT   Patient Name:   [name] [facility] Date of Exam: 05/05/2022 Medical Rec #:  [id number]        Height:       75.0 in Accession #:    [id number]       Weight:       213.0 lb Date of Birth:  1946-01-15        BSA:          2.254 m Patient Age:    83 years         BP:           104/68 mmHg Patient Gender: M                HR:           144 bpm. Exam Location:  Inpatient Procedure: 2D Echo, Color Doppler and Cardiac Doppler Indications:    R00.0 Tachycardia; R55 Syncope  History:        Patient has prior history of Echocardiogram examinations, most                 recent 05/31/2020. Arrythmias:Atrial Fibrillation; Risk                 Factors:Hypertension, Diabetes, Dyslipidemia and Sleep Apnea.  Sonographer:    Raquel Sarna Senior RDCS Referring Phys: [place] Comments:  HR has been variable since admission, patient has permanent A-fib IMPRESSIONS  1. Left ventricular ejection fraction, by estimation, is 70 to 75%. The left ventricle has hyperdynamic function. The left ventricle has no regional wall motion abnormalities. Left ventricular diastolic parameters are indeterminate.  2. Right ventricular systolic function is normal. The right ventricular size is moderately  enlarged. There is moderately elevated pulmonary artery systolic pressure. The estimated right ventricular systolic pressure is 123456 mmHg.  3. Left atrial size was massively dilated.  4. Right atrial size was severely dilated.  5. The mitral valve is normal in structure. Severe mitral valve regurgitation.  6. Tricuspid valve regurgitation is severe.  7. The aortic valve is tricuspid. Aortic valve regurgitation is trivial.  8. Pulmonic valve regurgitation is moderate.  9. The inferior vena cava is normal in size with <50% respiratory variability, suggesting right atrial pressure of 8 mmHg. Comparison(s): LV is more vigorous from prior, imaged at 140 bpm + during this study. FINDINGS  Left Ventricle: Left ventricular ejection fraction, by estimation, is 70 to 75%. The left ventricle has hyperdynamic function. The left ventricle has no regional wall motion abnormalities. The left ventricular internal cavity size was normal in size. There is no left ventricular hypertrophy. Left ventricular diastolic parameters are indeterminate. Right Ventricle: The right ventricular size is moderately enlarged. No increase in right ventricular wall thickness. Right ventricular systolic function is normal. There is moderately elevated pulmonary artery systolic pressure. The tricuspid regurgitant  velocity is 3.51 m/s, and with an assumed right atrial pressure of 8 mmHg, the estimated right ventricular systolic pressure is 123456 mmHg. Left Atrium: Left atrial size was massively dilated. Right Atrium: Right atrial size was severely dilated. Pericardium: There is no evidence of pericardial effusion. Mitral Valve: The mitral valve is normal in structure. Severe mitral valve regurgitation. Tricuspid Valve: The tricuspid valve is normal in structure. Tricuspid valve regurgitation is severe. Aortic Valve: The aortic valve is tricuspid. Aortic valve regurgitation is trivial. Pulmonic Valve: The pulmonic valve was not well visualized. Pulmonic  valve regurgitation is moderate. No evidence of pulmonic stenosis. Aorta: The aortic root and ascending aorta are structurally normal, with no evidence of dilitation. Venous: The inferior vena cava is normal in size with less than 50% respiratory variability, suggesting right atrial pressure of 8 mmHg. IAS/Shunts: No atrial level shunt detected by color flow Doppler.  LEFT VENTRICLE PLAX 2D LVIDd:         4.20 cm LVIDs:         2.80 cm LV PW:         1.00 cm LV IVS:        0.90 cm LVOT diam:     2.20 cm LV SV:         69 LV SV Index:   30 LVOT Area:     3.80 cm  RIGHT VENTRICLE RV S prime:     18.00 cm/s TAPSE (M-mode): 2.0 cm LEFT ATRIUM              Index         RIGHT ATRIUM           Index LA diam:        7.80 cm  3.46 cm/m    RA Area:     40.40 cm LA Vol (A2C):   225.0 ml 99.83 ml/m   RA Volume:   143.00 ml 63.45 ml/m LA Vol (A4C):   268.0 ml 118.90 ml/m LA Biplane Vol: 252.0 ml  111.81 ml/m  AORTIC VALVE LVOT Vmax:   134.33 cm/s LVOT Vmean:  89.900 cm/s LVOT VTI:    0.181 m  AORTA Ao Root diam: 3.40 cm Ao Asc diam:  3.10 cm TRICUSPID VALVE TR Peak grad:   49.3 mmHg TR Vmax:        351.00 cm/s  SHUNTS Systemic VTI:  0.18 m Systemic Diam: 2.20 cm Rudean Haskell MD Electronically signed by Rudean Haskell MD Signature Date/Time: 05/05/2022/4:35:14 PM    Final      LOS: 4 days   Oren Binet, MD  [facility]    To contact the attending provider between 7A-7P or the covering provider during after hours 7P-7A, please log into the web site [email or web address] and access using universal [place] password for that web site. If you do not have the password, please call the hospital operator.  05/07/2022, 11:27 AM

## 2022-05-08 DIAGNOSIS — D649 Anemia, unspecified: Secondary | ICD-10-CM | POA: Diagnosis not present

## 2022-05-08 DIAGNOSIS — I872 Venous insufficiency (chronic) (peripheral): Secondary | ICD-10-CM | POA: Diagnosis not present

## 2022-05-08 DIAGNOSIS — I10 Essential (primary) hypertension: Secondary | ICD-10-CM | POA: Diagnosis not present

## 2022-05-08 DIAGNOSIS — K922 Gastrointestinal hemorrhage, unspecified: Secondary | ICD-10-CM | POA: Diagnosis not present

## 2022-05-08 LAB — CBC
HCT: 24 % — ABNORMAL LOW (ref 39.0–52.0)
Hemoglobin: 8 g/dL — ABNORMAL LOW (ref 13.0–17.0)
MCH: 31.9 pg (ref 26.0–34.0)
MCHC: 33.3 g/dL (ref 30.0–36.0)
MCV: 95.6 fL (ref 80.0–100.0)
Platelets: 162 10*3/uL (ref 150–400)
RBC: 2.51 MIL/uL — ABNORMAL LOW (ref 4.22–5.81)
RDW: 18.7 % — ABNORMAL HIGH (ref 11.5–15.5)
WBC: 11.3 10*3/uL — ABNORMAL HIGH (ref 4.0–10.5)
nRBC: 0 % (ref 0.0–0.2)

## 2022-05-08 LAB — BASIC METABOLIC PANEL
Anion gap: 4 — ABNORMAL LOW (ref 5–15)
BUN: 35 mg/dL — ABNORMAL HIGH (ref 8–23)
CO2: 26 mmol/L (ref 22–32)
Calcium: 7.8 mg/dL — ABNORMAL LOW (ref 8.9–10.3)
Chloride: 105 mmol/L (ref 98–111)
Creatinine, Ser: 1.55 mg/dL — ABNORMAL HIGH (ref 0.61–1.24)
GFR, Estimated: 46 mL/min — ABNORMAL LOW (ref >60)
Glucose, Bld: 120 mg/dL — ABNORMAL HIGH (ref 70–99)
Potassium: 3.6 mmol/L (ref 3.5–5.1)
Sodium: 135 mmol/L (ref 135–145)

## 2022-05-08 LAB — GLUCOSE, CAPILLARY
Glucose-Capillary: 128 mg/dL — ABNORMAL HIGH (ref 70–99)
Glucose-Capillary: 227 mg/dL — ABNORMAL HIGH (ref 70–99)
Glucose-Capillary: 156 mg/dL — ABNORMAL HIGH (ref 70–99)

## 2022-05-08 MED ORDER — FLUDROCORTISONE ACETATE 0.1 MG PO TABS
0.1000 mg | ORAL_TABLET | Freq: Every day | ORAL | Status: DC
  Administered 2022-05-08 – 2022-05-10 (×3): 0.1 mg via ORAL
  Filled 2022-05-08 (×3): qty 1

## 2022-05-08 NOTE — Progress Notes (Signed)
Occupational Therapy Treatment Patient Details Name: [name] MRN: [id number] DOB: 10-Feb-1946 Today's Date: 05/08/2022   History of present illness 77 y/o M admitted to [facility] on 2/18 with complaints of wound infection and LOC. Found with upper GI bleed and acute blood loss anemia. Pt recently admitted to [facility] from 2/12-2/15 fir cellulitis of BLE. 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   OT comments  Patient pleasant and agreeable to OT session. Patient stated he recently returned to bed due to dizziness. BP able to get to EOB and BP checked at 130/77. Patient performed grooming seated on EOB with no complaints of dizziness. Patient performed standing from EOB and side stepping towards EOB with complaints of dizziness and unable to stand long enough for BP reading. Patient stood again following short rest and BP 92/75 with dizziness. Patient able to return to supine and BP was 117/70, HR 112. Patient is making gains but is limited by orthostatic. Acute OT to continue to follow.    Recommendations for follow up therapy are one component of a multi-disciplinary discharge planning process, led by the attending physician.  Recommendations may be updated based on patient status, additional functional criteria and insurance authorization.    Follow Up Recommendations  Home health OT (pending progress)     Assistance Recommended at Discharge Frequent or constant Supervision/Assistance  Patient can return home with the following  A little help with walking and/or transfers;A lot of help with bathing/dressing/bathroom;Assistance with cooking/housework;Assist for transportation;Help with stairs or ramp for entrance;Direct supervision/assist for financial management;Direct supervision/assist for medications  management   Equipment Recommendations  None recommended by OT    Recommendations for Other Services      Precautions / Restrictions Precautions Precautions: Fall Precaution Comments: watch BP Restrictions Weight Bearing Restrictions: No       Mobility Bed Mobility Overal bed mobility: Needs Assistance Bed Mobility: Supine to Sit, Sit to Supine     Supine to sit: Min guard, HOB elevated Sit to supine: Min guard, HOB elevated   General bed mobility comments: min guard for safety with verbal cues    Transfers Overall transfer level: Needs assistance Equipment used: Rolling walker (2 wheels) Transfers: Sit to/from Stand Sit to Stand: Min assist           General transfer comment: sit to stands performed from EOB to address balance and BP     Balance Overall balance assessment: Needs assistance Sitting-balance support: No upper extremity supported, Feet supported Sitting balance-Leahy Scale: Fair Sitting balance - Comments: able to perform self care tasks seated on EOB   Standing balance support: Bilateral upper extremity supported, During functional activity Standing balance-Leahy Scale: Poor Standing balance comment: reliant on RW for support, limited standing tolerance due to dizziness and orthostatic                           ADL either performed or assessed with clinical judgement   ADL Overall ADL's : Needs assistance/impaired     Grooming: Wash/dry hands;Wash/dry face;Oral care;Set up;Sitting Grooming Details (indicate cue type and reason): on EOB                                    Extremity/Trunk Assessment  Vision       Perception     Praxis      Cognition Arousal/Alertness: Awake/alert Behavior During Therapy: WFL for tasks assessed/performed Overall Cognitive Status: Impaired/Different from baseline Area of Impairment: Following commands, Safety/judgement, Problem solving                        Following Commands: Follows one step commands consistently, Follows one step commands with increased time Safety/Judgement: Decreased awareness of deficits   Problem Solving: Requires verbal cues General Comments: alert and oriented        Exercises      Shoulder Instructions       General Comments BP seated on EOB  130/77, 92/75 standing, 117/70 back in supine, HR increased to 140s when standing    Pertinent Vitals/ Pain       Pain Assessment Pain Assessment: Faces Faces Pain Scale: Hurts a little bit Pain Location: RLE Pain Descriptors / Indicators: Grimacing, Guarding Pain Intervention(s): Monitored during session  Home Living                                          Prior Functioning/Environment              Frequency  Min 2X/week        Progress Toward Goals  OT Goals(current goals can now be found in the care plan section)  Progress towards OT goals: Progressing toward goals  Acute Rehab OT Goals Patient Stated Goal: get better OT Goal Formulation: With patient Time For Goal Achievement: 05/20/22 Potential to Achieve Goals: Good ADL Goals Pt Will Perform Grooming: with modified independence;standing Pt Will Perform Lower Body Dressing: with modified independence;sit to/from stand Pt Will Transfer to Toilet: with modified independence;ambulating;bedside commode Pt Will Perform Toileting - Clothing Manipulation and hygiene: with modified independence;sit to/from stand Additional ADL Goal #1: Pt will recall and follow 3 step ADL task with no more than superivison.  Plan Discharge plan remains appropriate    Co-evaluation                 AM-PAC OT "6 Clicks" Daily Activity     Outcome Measure   Help from another person eating meals?: None Help from another person taking care of personal grooming?: A Little Help from another person toileting, which includes using toliet, bedpan, or urinal?: A Lot Help from  another person bathing (including washing, rinsing, drying)?: A Lot Help from another person to put on and taking off regular upper body clothing?: A Little Help from another person to put on and taking off regular lower body clothing?: A Little 6 Click Score: 17    End of Session Equipment Utilized During Treatment: Gait belt;Rolling walker (2 wheels)  OT Visit Diagnosis: Other abnormalities of gait and mobility (R26.89);Muscle weakness (generalized) (M62.81);Other symptoms and signs involving cognitive function   Activity Tolerance Patient tolerated treatment well   Patient Left in bed;with call bell/phone within reach;with bed alarm set;with family/visitor present   Nurse Communication Mobility status;Other (comment) (BP readings)        Time: [id number] OT Time Calculation (min): 27 min  Charges: OT General Charges $OT Visit: 1 Visit OT Treatments $Self Care/Home Management : 8-22 mins $Therapeutic Activity: 8-22 mins  Lodema Hong, OTA Acute Rehabilitation Services  Office [phone]   Trixie Dredge 05/08/2022, 2:19 PM

## 2022-05-08 NOTE — Progress Notes (Signed)
Physical Therapy Treatment Patient Details Name: [name] MRN: [id number] DOB: 06-Sep-1945 Today's Date: 05/08/2022   History of Present Illness 77 y/o M admitted to [facility] on 2/18 with complaints of wound infection and LOC. Found with upper GI bleed and acute blood loss anemia. Pt recently admitted to [facility] from 2/12-2/15 fir cellulitis of BLE. 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    PT Comments    Pt tolerated today's session well, despite continuing to have issues maintaining BP when standing. Pt's BP in supine 124/75 with HR 105, sitting 101/65 and HR 135, standing 88/64 and HR 157, pt reporting dizziness with standing. Pt returned to supine and performed seated TherEx, relief of dizziness. Pt stood again and able to ambulate to chair, reporting very mild dizziness, BP once seated 90/72 and HR 108. Pt educated on taking his time when changing positions as well as performing exercises prior to standing to assist in maintaining BP. Pt will continue to benefit from skilled acute PT to progress mobility and activity/upright tolerance, discharge plan remains appropriate.    Recommendations for follow up therapy are one component of a multi-disciplinary discharge planning process, led by the attending physician.  Recommendations may be updated based on patient status, additional functional criteria and insurance authorization.  Follow Up Recommendations  Home health PT     Assistance Recommended at Discharge Intermittent Supervision/Assistance  Patient can return home with the following Help with stairs or ramp for entrance;Assist for transportation;A little help with walking and/or transfers;Assistance with cooking/housework   Equipment Recommendations  None recommended by PT    Recommendations for  Other Services       Precautions / Restrictions Precautions Precautions: Fall Precaution Comments: watch BP Restrictions Weight Bearing Restrictions: No     Mobility  Bed Mobility Overal bed mobility: Needs Assistance Bed Mobility: Supine to Sit     Supine to sit: Min guard, HOB elevated     General bed mobility comments: minG for safety with supine>sit with use of bed rails, ended session with pt in chair    Transfers Overall transfer level: Needs assistance Equipment used: Rolling walker (2 wheels) Transfers: Sit to/from Stand Sit to Stand: Min assist           General transfer comment: cueing for hand placement and safety, min assist to power up, improving with repetition but remaining minA for 3 repetitions. First trial pt with posterior lean but correcting with cues and minA    Ambulation/Gait Ambulation/Gait assistance: Min assist Gait Distance (Feet): 20 Feet Assistive device: Rolling walker (2 wheels) Gait Pattern/deviations: Step-through pattern, Trunk flexed, Narrow base of support, Decreased stride length Gait velocity: decreased     General Gait Details: ambulated in room, downward gaze and increased trunk flexion noted, cued for upright posture. Assist required for mild imbalance and RW navigation   Stairs             Wheelchair Mobility    Modified Rankin (Stroke Patients Only)       Balance Overall balance assessment: Needs assistance Sitting-balance support: No upper extremity supported, Feet supported Sitting balance-Leahy Scale: Fair Sitting balance - Comments: stable static sitting   Standing balance support: Bilateral upper extremity supported, During functional activity Standing balance-Leahy Scale: Poor Standing balance comment: reliant on RW for standing and ambulation, minA required for balance  Cognition Arousal/Alertness: Awake/alert Behavior During Therapy: WFL for tasks  assessed/performed Overall Cognitive Status: Impaired/Different from baseline Area of Impairment: Following commands, Safety/judgement, Problem solving                       Following Commands: Follows one step commands consistently, Follows one step commands with increased time Safety/Judgement: Decreased awareness of deficits   Problem Solving: Requires verbal cues General Comments: pt pleasant throughout, able to state when feeling dizzy when asked        Exercises General Exercises - Upper Extremity Shoulder Flexion: AROM, Both, 10 reps, Seated General Exercises - Lower Extremity Ankle Circles/Pumps: AROM, Both, Seated, 20 reps Long Arc Quad: AROM, Both, Seated, 10 reps Hip Flexion/Marching: AROM, Both, Seated, 20 reps    General Comments General comments (skin integrity, edema, etc.): SPO2 stable on room air, BP at rest in bed 124/75, seated 101/65, standing 88/64, pt symptomatic. Pt returned to sitting and performed TherEx, was able to tolerate ambulation with BP 90/72 once back seated in chair      Pertinent Vitals/Pain Pain Assessment Pain Assessment: Faces Faces Pain Scale: Hurts a little bit Pain Location: RLE Pain Descriptors / Indicators: Grimacing, Guarding Pain Intervention(s): Monitored during session    Home Living                          Prior Function            PT Goals (current goals can now be found in the care plan section) Acute Rehab PT Goals Patient Stated Goal: go home PT Goal Formulation: With patient Time For Goal Achievement: 05/19/22 Potential to Achieve Goals: Good Progress towards PT goals: Progressing toward goals    Frequency    Min 3X/week      PT Plan Current plan remains appropriate    Co-evaluation              AM-PAC PT "6 Clicks" Mobility   Outcome Measure  Help needed turning from your back to your side while in a flat bed without using bedrails?: A Little Help needed moving from lying  on your back to sitting on the side of a flat bed without using bedrails?: A Little Help needed moving to and from a bed to a chair (including a wheelchair)?: A Little Help needed standing up from a chair using your arms (e.g., wheelchair or bedside chair)?: A Little Help needed to walk in hospital room?: A Little Help needed climbing 3-5 steps with a railing? : A Lot 6 Click Score: 17    End of Session Equipment Utilized During Treatment: Gait belt Activity Tolerance: Patient tolerated treatment well Patient left: with call bell/phone within reach;in chair;with chair alarm set;with nursing/sitter in room Nurse Communication: Mobility status PT Visit Diagnosis: Muscle weakness (generalized) (M62.81);Difficulty in walking, not elsewhere classified (R26.2)     Time: [id number] PT Time Calculation (min) (ACUTE ONLY): 16 min  Charges:  $Therapeutic Activity: 8-22 mins                     Charlynne Cousins, PT DPT Acute Rehabilitation Services Office [phone]    Luvenia Heller 05/08/2022, 10:46 AM

## 2022-05-08 NOTE — Progress Notes (Signed)
PROGRESS NOTE        PATIENT DETAILS Name: [name] Age: 77 y.o. Sex: male Date of Birth: Dec 12, 1945 Admit Date: 05/03/2022 Admitting Physician Norval Morton, MD PCP:de [place], Raymond J, MD  Brief Summary: Patient is a 77 y.o.  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  Underwent EGD which showed nonbleeding duodenal ulcer-GI bleeding resolved-he was started on anticoagulation 2/21, however further hospital course complicated by severe orthostatic hypotension.  See below for further details.  Significant events: 2/18>> admit to [facility] 2/19>> EGD-duodenal ulcer-nonbleeding 2/21>> Eliquis started  Significant studies: 2/18>> CXR: No PNA  Significant microbiology data: None  Procedures: 2/19>> EGD: Few gastric polyps, nonbleeding duodenal ulcer 2/20>> echo: EF 70-75%, RVSP 77.3  Consults: GI  Subjective: Has not had a BM for 2-3 days.  Still dizzy when he stands up-continues to have positive orthostatic vital signs.  No vomiting/diarrhea.  Objective: Vitals: Blood pressure 113/67, pulse (!) 105, temperature 98.6 F (37 C), temperature source Oral, resp. rate 18, height '6\' 3"'$  (1.905 m), weight 96.6 kg, SpO2 91 %.   Exam: Gen Exam:Alert awake-not in any distress HEENT:atraumatic, normocephalic Chest: B/L clear to auscultation anteriorly CVS:S1S2 regular Abdomen:soft non tender, non distended Extremities:no edema Neurology: Non focal Skin: no rash  Pertinent Labs/Radiology:    Latest Ref Rng & Units 05/08/2022    7:09 AM 05/07/2022    4:21 PM 05/07/2022    4:42 AM  CBC  WBC 4.0 - 10.5 K/uL 11.3  10.2  9.6   Hemoglobin 13.0 - 17.0 g/dL 8.0  7.7  7.5   Hematocrit 39.0 - 52.0 % 24.0  24.0  22.8   Platelets 150 - 400 K/uL 162  152  141     Lab Results  Component Value Date   NA 135  05/08/2022   K 3.6 05/08/2022   CL 105 05/08/2022   CO2 26 05/08/2022      Assessment/Plan: Upper GI bleeding with acute blood loss anemia secondary to  duodenal ulcer Brown stools on 2/20-no BM since then Hb stable after 4 units of PRBC (last transfusion 2/19) Continue PPI EGD biopsy negative for H. pylori/malignancy GI recommending second look EGD in 6 to 8 weeks  Syncope Due to orthostatic hypotension in the setting of GI bleeding Telemetry with stable atrial fibrillation Echo with stable EF  Orthostatic hypotension Continues to be persistently orthostatic Flomax remains on hold Continue midodrine 10 mg 3 times daily Will add Florinef today (although on prednisone)  Has received several boluses of IVF over the past several days-volume status is stable.   Although persistently orthostatic-does not appear to have recurrence of GI bleeding given stability of Hb/lack of BM since 2/20 and decreasing BUN.    AKI on CKD stage IIIb AKI due to GI bleeding/hemodynamically mediated Improved-creatinine close to baseline Continue to avoid nephrotoxic agents.   Hyperkalemia Resolved with Lokelma and improvement in renal function. Follow electrolytes periodically.  Chronic atrial fibrillation Rate controlled On Coumadin prior to this hospitalization-with plans to transition to Eliquis when INR was subtherapeutic.  He was not on Coumadin since 2/10-INR was still therapeutic on initial presentation-likely due to interaction with cefdinir.  He required 1 dose of vitamin K on 2/18 prior to EGD. Once  GI bleeding was felt to have resolved-he was started on Eliquis on 2/21.     HTN BP stable at rest-but has severe orthostatic hypotension-see above  DM-2 (A1c 6.7 on 1/9) CBGs stable with SSI Continue to hold all oral hypoglycemics.   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   Weight as of this encounter: 96.6 kg.   Code status:   Code Status: Full Code   DVT Prophylaxis: Place TED hose Start: 05/05/22 1126 SCDs Start: 05/03/22 1042 apixaban (ELIQUIS) tablet 5 mg    Family Communication:Spouse-Fran Spratt-[phone] -updated 2/22   Disposition Plan: Status is: Inpatient Remains inpatient appropriate because: Severity of illness-persistent orthostatic hypotension.   Planned Discharge Destination:Home   Diet: Diet Order             Diet heart healthy/carb modified Room service appropriate? Yes; Fluid consistency: Thin  Diet effective now                     Antimicrobial agents: Anti-infectives (From admission, onward)    Start     Dose/Rate Route Frequency Ordered Stop   05/03/22 2200  cefdinir (OMNICEF) capsule 300 mg  Status:  Discontinued        300 mg Oral 2 times daily 05/03/22 1900 05/06/22 1051        MEDICATIONS: Scheduled Meds:  sodium chloride   Intravenous Once   sodium chloride   Intravenous Once   apixaban  5 mg Oral BID   DULoxetine  20 mg Oral Daily   feeding supplement  237 mL Oral BID BM   gabapentin  100 mg Oral TID   insulin aspart  0-5 Units Subcutaneous QHS   insulin aspart  0-9 Units Subcutaneous TID WC   midodrine  10 mg Oral TID WC   multivitamin with minerals  1 tablet Oral Daily   pantoprazole  40 mg Intravenous Q12H   predniSONE  10 mg Oral Q breakfast   silver sulfADIAZINE   Topical BID    simvastatin  20 mg Oral QHS   sodium chloride flush  3 mL Intravenous Q12H   Continuous Infusions:   PRN Meds:.acetaminophen **OR** acetaminophen, albuterol, lidocaine, oxyCODONE   I have personally reviewed following labs and imaging studies  LABORATORY DATA: CBC: Recent Labs  Lab 05/03/22 0755 05/04/22 0046 05/05/22 0440 05/06/22 0442 05/07/22 0442 05/07/22 1621 05/08/22 0709  WBC 17.6*   < > 11.9* 10.4 9.6 10.2 11.3*  NEUTROABS 15.3*  --   --   --   --   --   --   HGB 6.6*   < > 8.7* 8.0* 7.5* 7.7* 8.0*  HCT 20.1*   < > 26.2* 24.8* 22.8* 24.0* 24.0*  MCV 98.5   < > 93.9 96.5 96.6 98.4 95.6  PLT 151   < > 126* 140* 141* 152 162   < > = values in this interval not displayed.     Basic Metabolic  Panel: Recent Labs  Lab 05/03/22 0755 05/04/22 0046 05/05/22 0440 05/07/22 0442 05/08/22 0709  NA 132* 136 139 136 135  K 5.6* 5.2* 4.0 3.9 3.6  CL 105 108 108 107 105  CO2 18* 21* '24 26 26  '$ GLUCOSE 258* 186* 163* 154* 120*  BUN 139* 132* 94* 53* 35*  CREATININE 2.30* 1.96* 1.89* 1.62* 1.55*  CALCIUM 8.6* 8.5* 8.2* 7.9* 7.8*  MG  --  2.3  --   --   --      GFR: Estimated Creatinine Clearance: 49.2 mL/min (A) (by C-G formula based on SCr of 1.55 mg/dL (H)).  Liver Function Tests: Recent Labs  Lab 05/03/22 0755  AST 20  ALT 21  ALKPHOS 52  BILITOT 0.2*  PROT 4.7*  ALBUMIN 1.9*    No results for input(s): "LIPASE", "AMYLASE" in the last 168 hours. No results for input(s): "AMMONIA" in the last 168 hours.  Coagulation Profile: Recent Labs  Lab 05/03/22 0755 05/04/22 0046 05/05/22 0440  INR 3.2* 1.4* 1.4*     Cardiac Enzymes: No results for input(s): "CKTOTAL", "CKMB", "CKMBINDEX", "TROPONINI" in the last 168 hours.  BNP (last 3 results) No results for input(s): "PROBNP" in the last 8760 hours.  Lipid Profile: No results for input(s): "CHOL", "HDL", "LDLCALC", "TRIG", "CHOLHDL", "LDLDIRECT" in the last 72 hours.  Thyroid Function Tests: No  results for input(s): "TSH", "T4TOTAL", "FREET4", "T3FREE", "THYROIDAB" in the last 72 hours.  Anemia Panel: No results for input(s): "VITAMINB12", "FOLATE", "FERRITIN", "TIBC", "IRON", "RETICCTPCT" in the last 72 hours.  Urine analysis:    Component Value Date/Time   COLORURINE YELLOW 05/03/2022 0745   APPEARANCEUR CLEAR 05/03/2022 0745   LABSPEC 1.015 05/03/2022 0745   PHURINE 5.5 05/03/2022 0745   GLUCOSEU NEGATIVE 05/03/2022 0745   HGBUR SMALL (A) 05/03/2022 0745   BILIRUBINUR NEGATIVE 05/03/2022 0745   KETONESUR NEGATIVE 05/03/2022 0745   PROTEINUR NEGATIVE 05/03/2022 0745   NITRITE NEGATIVE 05/03/2022 0745   LEUKOCYTESUR NEGATIVE 05/03/2022 0745    Sepsis Labs: Lactic Acid, Venous No results found for: "LATICACIDVEN"  MICROBIOLOGY: No results found for this or any previous visit (from the past 240 hour(s)).  RADIOLOGY STUDIES/RESULTS: No results found.   LOS: 5 days   Oren Binet, MD  [facility]    To contact the attending provider between 7A-7P or the covering provider during after hours 7P-7A, please log into the web site [email or web address] and access using universal Anna password for that web site. If you do not have the password, please call the hospital operator.  05/08/2022, 11:08 AM

## 2022-05-09 DIAGNOSIS — D649 Anemia, unspecified: Secondary | ICD-10-CM | POA: Diagnosis not present

## 2022-05-09 LAB — GLUCOSE, CAPILLARY
Glucose-Capillary: 133 mg/dL — ABNORMAL HIGH (ref 70–99)
Glucose-Capillary: 289 mg/dL — ABNORMAL HIGH (ref 70–99)
Glucose-Capillary: 277 mg/dL — ABNORMAL HIGH (ref 70–99)
Glucose-Capillary: 241 mg/dL — ABNORMAL HIGH (ref 70–99)

## 2022-05-09 LAB — CBC
HCT: 25.5 % — ABNORMAL LOW (ref 39.0–52.0)
Hemoglobin: 8.4 g/dL — ABNORMAL LOW (ref 13.0–17.0)
MCH: 31.3 pg (ref 26.0–34.0)
MCHC: 32.9 g/dL (ref 30.0–36.0)
MCV: 95.1 fL (ref 80.0–100.0)
Platelets: 187 10*3/uL (ref 150–400)
RBC: 2.68 MIL/uL — ABNORMAL LOW (ref 4.22–5.81)
RDW: 18.6 % — ABNORMAL HIGH (ref 11.5–15.5)
WBC: 13.1 10*3/uL — ABNORMAL HIGH (ref 4.0–10.5)
nRBC: 0 % (ref 0.0–0.2)

## 2022-05-09 LAB — BASIC METABOLIC PANEL
Anion gap: 11 (ref 5–15)
BUN: 36 mg/dL — ABNORMAL HIGH (ref 8–23)
CO2: 23 mmol/L (ref 22–32)
Calcium: 8 mg/dL — ABNORMAL LOW (ref 8.9–10.3)
Chloride: 102 mmol/L (ref 98–111)
Creatinine, Ser: 1.59 mg/dL — ABNORMAL HIGH (ref 0.61–1.24)
GFR, Estimated: 45 mL/min — ABNORMAL LOW (ref >60)
Glucose, Bld: 148 mg/dL — ABNORMAL HIGH (ref 70–99)
Potassium: 3.8 mmol/L (ref 3.5–5.1)
Sodium: 136 mmol/L (ref 135–145)

## 2022-05-09 MED ORDER — SODIUM CHLORIDE 0.9 % IV SOLN: INTRAVENOUS | Status: DC

## 2022-05-09 NOTE — Progress Notes (Signed)
PROGRESS NOTE        PATIENT DETAILS Name: [name] Age: 77 y.o. Sex: male Date of Birth: [birth year] Admit Date: 05/03/2022 Admitting Physician Norval Morton, MD PCP:de [place], Raymond J, MD  Brief Summary: Patient is a 77 y.o.  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  Underwent EGD which showed nonbleeding duodenal ulcer-GI bleeding resolved-he was started on anticoagulation 2/21, however further hospital course complicated by severe orthostatic hypotension.  See below for further details.  05/09/2022: Patient seen alongside patient's wife.  Patient remains significantly orthostatic.  Will start patient on IV normal saline 75 cc/h.  Will check orthostatic vital signs every 8 hours.  Orthostatic precautions discussed with patient and patient's wife extensively.  Possible discharge back home tomorrow.  Significant events: 2/18>> admit to [facility] 2/19>> EGD-duodenal ulcer-nonbleeding 2/21>> Eliquis started  Significant studies: 2/18>> CXR: No PNA  Significant microbiology data: None  Procedures: 2/19>> EGD: Few gastric polyps, nonbleeding duodenal ulcer 2/20>> echo: EF 70-75%, RVSP 77.3  Consults: GI  Subjective: Patient remains orthostatic.  Objective: Vitals: Blood pressure 114/67, pulse 94, temperature 97.6 F (36.4 C), temperature source Oral, resp. rate 17, height '6\' 3"'$  (1.905 m), weight 96.6 kg, SpO2 93 %.   Exam: Gen Exam: Awake and alert.  Not in any distress.   HEENT: Dry buccal mucosa.  Patient is pale.    Chest: B/L clear to auscultation anteriorly CVS:S1S2 regular Abdomen:soft non tender, non distended Extremities: Lower legs are wrapped. Neurology: Awake and alert.  Moves all extremities.     Pertinent Labs/Radiology:    Latest Ref Rng & Units 05/09/2022    4:01 AM  05/08/2022    7:09 AM 05/07/2022    4:21 PM  CBC  WBC 4.0 - 10.5 K/uL 13.1  11.3  10.2   Hemoglobin 13.0 - 17.0 g/dL 8.4  8.0  7.7   Hematocrit 39.0 - 52.0 % 25.5  24.0  24.0   Platelets 150 - 400 K/uL 187  162  152     Lab Results  Component Value Date   NA 136 05/09/2022   K 3.8 05/09/2022   CL 102 05/09/2022   CO2 23 05/09/2022      Assessment/Plan: Upper GI bleeding with acute blood loss anemia secondary to  duodenal ulcer Brown stools on 2/20-no BM since then Hb stable after 4 units of PRBC (last transfusion 2/19) Continue PPI EGD biopsy negative for H. pylori/malignancy GI recommending second look EGD in 6 to 8 weeks  Syncope Due to orthostatic hypotension in the setting of GI bleeding Telemetry with stable atrial fibrillation Echo with stable EF  Orthostatic hypotension Continues to be persistently orthostatic Flomax remains on hold Continue midodrine 10 mg 3 times daily Will add Florinef today (although on prednisone)  Has received several boluses of IVF over the past several days-volume status is stable.   Although persistently orthostatic-does not appear to have recurrence of GI bleeding given stability of Hb/lack of BM since 2/20 and decreasing BUN.    AKI on CKD stage IIIb AKI due to GI bleeding/hemodynamically mediated Improved-creatinine close to baseline Continue to avoid nephrotoxic agents.   Hyperkalemia Resolved with Lokelma and improvement in renal function. Follow electrolytes periodically.  Chronic atrial fibrillation Rate controlled  On Coumadin prior to this hospitalization-with plans to transition to Eliquis when INR was subtherapeutic.  He was not on Coumadin since 2/10-INR was still therapeutic on initial presentation-likely due to interaction with cefdinir.  He required 1 dose of vitamin K on 2/18 prior to EGD. Once GI bleeding was felt to have resolved-he was started on Eliquis on 2/21.     HTN BP stable at rest-but has severe orthostatic  hypotension-see above  DM-2 (A1c 6.7 on 1/9) CBGs stable with SSI Continue to hold all oral hypoglycemics.   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   Weight as of this encounter: 96.6 kg.   Code status:   Code Status: Full Code   DVT Prophylaxis: Place TED hose Start: 05/05/22 1126 SCDs Start: 05/03/22 1042 apixaban (ELIQUIS) tablet 5 mg    Family Communication:Spouse-Fran Patman-[phone] -updated 2/22   Disposition Plan: Status is: Inpatient Remains inpatient appropriate because: Severity of illness-persistent orthostatic hypotension.   Planned Discharge Destination:Home   Diet: Diet Order             Diet heart healthy/carb modified Room service appropriate? Yes; Fluid consistency: Thin  Diet effective now                     Antimicrobial agents: Anti-infectives (From admission, onward)    Start     Dose/Rate Route Frequency Ordered Stop   05/03/22 2200  cefdinir (OMNICEF) capsule 300 mg  Status:  Discontinued        300 mg Oral 2 times daily 05/03/22 1900 05/06/22 1051        MEDICATIONS: Scheduled Meds:  sodium chloride   Intravenous Once   sodium chloride   Intravenous Once   apixaban  5 mg Oral BID   DULoxetine  20 mg Oral Daily   feeding supplement  237 mL Oral BID BM   fludrocortisone  0.1 mg Oral Daily   gabapentin  100 mg  Oral TID   insulin aspart  0-5 Units Subcutaneous QHS   insulin aspart  0-9 Units Subcutaneous TID WC   midodrine  10 mg Oral TID WC   multivitamin with minerals  1 tablet Oral Daily   pantoprazole  40 mg Intravenous Q12H   predniSONE  10 mg Oral Q breakfast   silver sulfADIAZINE   Topical BID   simvastatin  20 mg Oral QHS   sodium chloride flush  3 mL Intravenous Q12H   Continuous Infusions:  sodium chloride      PRN Meds:.acetaminophen **OR** acetaminophen, albuterol, lidocaine, oxyCODONE   I have personally reviewed following labs and imaging studies  LABORATORY DATA: CBC: Recent Labs  Lab 05/03/22 0755 05/04/22 0046 05/06/22 0442 05/07/22 0442 05/07/22 1621 05/08/22 0709 05/09/22 0401  WBC 17.6*   < > 10.4 9.6 10.2 11.3* 13.1*  NEUTROABS 15.3*  --   --   --   --   --   --   HGB 6.6*   < >  8.0* 7.5* 7.7* 8.0* 8.4*  HCT 20.1*   < > 24.8* 22.8* 24.0* 24.0* 25.5*  MCV 98.5   < > 96.5 96.6 98.4 95.6 95.1  PLT 151   < > 140* 141* 152 162 187   < > = values in this interval not displayed.     Basic Metabolic Panel: Recent Labs  Lab 05/04/22 0046 05/05/22 0440 05/07/22 0442 05/08/22 0709 05/09/22 0401  NA 136 139 136 135 136  K 5.2* 4.0 3.9 3.6 3.8  CL 108 108 107 105 102  CO2 21* '24 26 26 23  '$ GLUCOSE 186* 163* 154* 120* 148*  BUN 132* 94* 53* 35* 36*  CREATININE 1.96* 1.89* 1.62* 1.55* 1.59*  CALCIUM 8.5* 8.2* 7.9* 7.8* 8.0*  MG 2.3  --   --   --   --      GFR: Estimated Creatinine Clearance: 48 mL/min (A) (by C-G formula based on SCr of 1.59 mg/dL (H)).  Liver Function Tests: Recent Labs  Lab 05/03/22 0755  AST 20  ALT 21  ALKPHOS 52  BILITOT 0.2*  PROT 4.7*  ALBUMIN 1.9*    No results for input(s): "LIPASE", "AMYLASE" in the last 168 hours. No results for input(s): "AMMONIA" in the last 168 hours.  Coagulation Profile: Recent Labs  Lab 05/03/22 0755 05/04/22 0046 05/05/22 0440  INR 3.2* 1.4* 1.4*     Cardiac Enzymes: No results  for input(s): "CKTOTAL", "CKMB", "CKMBINDEX", "TROPONINI" in the last 168 hours.  BNP (last 3 results) No results for input(s): "PROBNP" in the last 8760 hours.  Lipid Profile: No results for input(s): "CHOL", "HDL", "LDLCALC", "TRIG", "CHOLHDL", "LDLDIRECT" in the last 72 hours.  Thyroid Function Tests: No results for input(s): "TSH", "T4TOTAL", "FREET4", "T3FREE", "THYROIDAB" in the last 72 hours.  Anemia Panel: No results for input(s): "VITAMINB12", "FOLATE", "FERRITIN", "TIBC", "IRON", "RETICCTPCT" in the last 72 hours.  Urine analysis:    Component Value Date/Time   COLORURINE YELLOW 05/03/2022 0745   APPEARANCEUR CLEAR 05/03/2022 0745   LABSPEC 1.015 05/03/2022 0745   PHURINE 5.5 05/03/2022 0745   GLUCOSEU NEGATIVE 05/03/2022 0745   HGBUR SMALL (A) 05/03/2022 0745   BILIRUBINUR NEGATIVE 05/03/2022 0745   KETONESUR NEGATIVE 05/03/2022 0745   PROTEINUR NEGATIVE 05/03/2022 0745   NITRITE NEGATIVE 05/03/2022 0745   LEUKOCYTESUR NEGATIVE 05/03/2022 0745    Sepsis Labs: Lactic Acid, Venous No results found for: "LATICACIDVEN"  MICROBIOLOGY: No results found for this or any previous visit (from the past 240 hour(s)).  RADIOLOGY STUDIES/RESULTS: No results found.   LOS: 6 days   Bonnell Public, MD  [facility]    To contact the attending provider between 7A-7P or the covering provider during after hours 7P-7A, please log into the web site [email or web address] and access using universal [place] password for that web site. If you do not have the password, please call the hospital operator.  05/09/2022, 11:58 AM

## 2022-05-10 DIAGNOSIS — D649 Anemia, unspecified: Secondary | ICD-10-CM | POA: Diagnosis not present

## 2022-05-10 LAB — GLUCOSE, CAPILLARY
Glucose-Capillary: 133 mg/dL — ABNORMAL HIGH (ref 70–99)
Glucose-Capillary: 243 mg/dL — ABNORMAL HIGH (ref 70–99)

## 2022-05-10 MED ORDER — ADULT MULTIVITAMIN W/MINERALS CH: 1.0000 | ORAL_TABLET | Freq: Every day | ORAL | 0 refills | Status: AC

## 2022-05-10 MED ORDER — METOPROLOL TARTRATE 25 MG PO TABS: 12.5000 mg | ORAL_TABLET | Freq: Two times a day (BID) | ORAL | 0 refills | Status: DC

## 2022-05-10 MED ORDER — ENSURE ENLIVE PO LIQD: 237.0000 mL | Freq: Two times a day (BID) | ORAL | 12 refills | Status: AC

## 2022-05-10 MED ORDER — FLUDROCORTISONE ACETATE 0.1 MG PO TABS: 0.1000 mg | ORAL_TABLET | Freq: Every day | ORAL | 0 refills | Status: DC

## 2022-05-10 MED ORDER — PREDNISONE 10 MG PO TABS: 5.0000 mg | ORAL_TABLET | Freq: Every day | ORAL | 1 refills | Status: AC

## 2022-05-10 MED ORDER — PANTOPRAZOLE SODIUM 40 MG PO TBEC: 40.0000 mg | DELAYED_RELEASE_TABLET | Freq: Every day | ORAL | 0 refills | Status: DC

## 2022-05-10 MED ORDER — APIXABAN 5 MG PO TABS: 5.0000 mg | ORAL_TABLET | Freq: Two times a day (BID) | ORAL | 0 refills | Status: AC

## 2022-05-10 MED ORDER — MIDODRINE HCL 10 MG PO TABS: 10.0000 mg | ORAL_TABLET | Freq: Three times a day (TID) | ORAL | 0 refills | Status: DC

## 2022-05-10 NOTE — Discharge Summary (Signed)
Physician Discharge Summary  Patient ID: [name] MRN: [id number] DOB/AGE: 1945/05/16 77 y.o.  Admit date: 05/03/2022 Discharge date: 05/10/2022  Admission Diagnoses:  Discharge Diagnoses:  Principal Problem:   Symptomatic anemia Active Problems:   Hypertension   Controlled type 2 diabetes mellitus without complication, without long-term current use of insulin (HCC)   Acute kidney injury superimposed on CKD (HCC)   Hyperkalemia   Stasis dermatitis   BPH (benign prostatic hyperplasia)   Leukocytosis   Supratherapeutic INR   Persistent atrial fibrillation (HCC)   Leukocytoclastic vasculitis (HCC)   Duodenal ulcer   Orthostatic hypotension   Discharged Condition: stable  Hospital Course: Patient is a 77 year old male with history of hypertension, hyperlipidemia, atrial fibrillation on Coumadin, 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  Patient was admitted and managed for syncope, likely upper GI bleed, acute blood loss anemia, orthostatic hypotension, acute kidney injury on chronic kidney disease stage IIIb, hyperkalemia and chronic atrial fibrillation.  GI team was consulted to assist with patient's management.  Patient underwent EGD on 05/04/2022.  EGD revealed polyp of the stomach and duodenum, duodenal ulcer, unspecified as acute or chronic, without hemorrhage or perforation.  Patient's anticoagulation was restarted.  Hemoglobin has remained stable.  Patient will be discharged back home to the care of the primary care provider and GI team.  Patient was also noted to be orthostatic.  This resolved significantly with adequate hydration.  Upper GI bleeding with acute blood loss anemia secondary to  duodenal ulcer: -Patient underwent EGD. -No active bleeding noted. -Hb stable after 4 units of PRBC (last transfusion 2/19) -Continue PPI EGD biopsy negative for H. pylori/malignancy GI recommending second look EGD in 6 to 8 weeks   Syncope Due  to orthostatic hypotension in the setting of GI bleeding Telemetry with stable atrial fibrillation Echo with stable EF   Orthostatic hypotension Likely multifactorial. Recent GI bleed and volume depletion. Continue midodrine 10 mg 3 times daily Continue Florinef. Orthostatic hypotension improved significantly with volume resuscitation. -Primary care provider to slowly wean patient off midodrine and Florinef.    AKI on CKD stage IIIb AKI due to GI bleeding/hemodynamically mediated Improved-creatinine close to baseline Continue to avoid nephrotoxic agents.    Hyperkalemia Resolved with Lokelma and improvement in renal function. Follow electrolytes periodically.   Chronic atrial fibrillation Rate controlled Patient was on Coumadin prior to this hospitalization Patient will be discharged on Eliquis.     HTN: -PCP to continue to monitor closely.     DM-2 (A1c 6.7 on 1/9) CBGs stable with SSI Continue to hold all oral hypoglycemics.   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   Weight as of this encounter: 96.6 kg.     Consults: GI and wound care team.  Significant Diagnostic Studies: EGD as documented above.   Discharge Exam: Blood pressure (!) 115/56, pulse (!) 110,  temperature 97.6 F (36.4 C), temperature source Oral,  resp. rate (!) 23, height '6\' 3"'$  (1.905 m), weight 96.6 kg, SpO2 94 %.   Disposition: Discharge disposition: 01-Home or Self Care       Discharge Instructions     Amb Referral to Nutrition and Diabetic Education   Complete by: As directed    Diet - low sodium heart healthy   Complete by: As directed    Discharge wound care:   Complete by: As directed    Continue current wound care plan   Increase activity slowly   Complete by: As directed       Allergies as of 05/10/2022       Reactions   Atorvastatin Other (See Comments)   myalgia   Lisinopril    Other reaction(s): Hyperkalemia, Renal impairment        Medication List     STOP taking these medications    cefdinir 300 MG capsule Commonly known as: OMNICEF   furosemide 20 MG tablet Commonly known as: LASIX   triamcinolone ointment 0.1 % Commonly known as: KENALOG   warfarin 1 MG tablet Commonly known as: COUMADIN   warfarin 2 MG tablet Commonly known as: COUMADIN       TAKE these medications    acetaminophen 500 MG tablet Commonly known as: TYLENOL Take 1,000 mg by mouth as needed for mild pain.   allopurinol 100 MG tablet Commonly known as: ZYLOPRIM TAKE 1 TABLET BY MOUTH TWICE A DAY   apixaban 5 MG Tabs tablet Commonly known as: ELIQUIS Take 1 tablet (5 mg total) by mouth 2 (two) times daily.   DULoxetine 20 MG capsule Commonly known as: CYMBALTA Take 1 capsule (20 mg total) by mouth daily.   famotidine 20 MG tablet Commonly known as: PEPCID TAKE 1 TABLET BY MOUTH DAILY - KEEP APPOINTMENT FOR FURTHER REFILLS What changed: See the new instructions.   feeding supplement Liqd Take 237 mLs by mouth 2 (two) times daily between meals.   fludrocortisone 0.1 MG tablet Commonly known as: FLORINEF Take 1 tablet (0.1 mg total) by mouth daily. Start taking on: May 11, 2022   FREESTYLE LITE TEST VI   gabapentin 100 MG capsule Commonly known as: NEURONTIN Take 1 capsule (100 mg total)  by mouth 3 (three) times daily.   glipiZIDE 2.5 MG 24 hr tablet Commonly known as: GLUCOTROL XL TAKE 1 TABLET BY MOUTH DAILY WITH BREAKFAST   lidocaine 5 % Commonly known as: LIDODERM Place 1 patch onto the skin as needed. Remove & Discard patch within 12 hours or as directed by MD   metoprolol tartrate 25 MG tablet Commonly known as: LOPRESSOR Take 0.5 tablets (12.5 mg total) by mouth 2 (two) times daily. What changed:  medication strength how much to take   midodrine 10 MG tablet Commonly known as: PROAMATINE Take 1 tablet (10 mg total) by mouth 3 (three) times daily with meals.   multivitamin with minerals Tabs tablet Take 1 tablet by mouth daily. Start taking on: May 11, 2022   oxyCODONE 5 MG immediate release tablet Commonly known as: Oxy IR/ROXICODONE Take 5 mg by mouth 3 (three) times daily as needed.   pantoprazole 40 MG tablet Commonly known as: Protonix Take 1 tablet (40 mg total) by mouth daily.   predniSONE 10 MG tablet Commonly known as: DELTASONE Take 0.5 tablets (5 mg total) by mouth daily with breakfast.   silver sulfADIAZINE 1 % cream Commonly known as: SILVADENE Apply topically.  simvastatin 20 MG tablet Commonly known as: ZOCOR Take 1 tablet (20 mg total) by mouth at bedtime.   tamsulosin 0.4 MG Caps capsule Commonly known as: FLOMAX Take 0.4 mg by mouth daily.               Discharge Care Instructions  (From admission, onward)           Start     Ordered   05/10/22 0000  Discharge wound care:       Comments: Continue current wound care plan   05/10/22 1242            Follow-up Information     Willia Craze, NP Follow up on 06/08/2022.   Specialty: Gastroenterology Why: at 9:00 am Contact information: [address] [place] [address] [phone]                 Time spent: 36 minutes.  SignedBonnell Public 05/10/2022, 12:43 PM

## 2022-05-10 NOTE — TOC Transition Note (Signed)
Transition of Care [facility]) - CM/SW Discharge Note   Patient Details  Name: OLANDA CARUFEL MRN: [id number] Date of Birth: [birth year]  [facility]) CM/SW Contact:  Carles Collet, RN Phone Number: 05/10/2022, 12:56 PM   Clinical Narrative:      [facility] DC   Barriers to Discharge: Continued Medical Work up   Patient Goals and CMS Choice CMS Medicare.gov Compare Post Acute Care list provided to:: Other (Comment Required) (Patient already current with [facility]) Choice offered to / list presented to : NA (Current with [facility])  Discharge Placement                         Discharge Plan and Services Additional resources added to the After Visit Summary for   In-house Referral: NA Discharge Planning Services: CM Consult Post Acute Care Choice: Home Health          DME Arranged: N/A DME Agency: NA       HH Arranged: PT, OT HH Agency: [place] Date [facility] Agency Contacted: 05/06/22 Time HH Agency Contacted: 1501 Representative spoke with at [place]: Tommi Rumps  Social Determinants of Health ([place]) Interventions SDOH Screenings   Food Insecurity: No Food Insecurity (05/07/2022)  Transportation Needs: No Transportation Needs (05/07/2022)  Utilities: Not At Risk (05/07/2022)  Depression (PHQ2-9): Low Risk  (03/24/2022)  Tobacco Use: Low Risk  (05/06/2022)     Readmission Risk Interventions     No data to display

## 2022-05-10 NOTE — Plan of Care (Signed)
  Problem: Education: Goal: Ability to describe self-care measures that may prevent or decrease complications (Diabetes Survival Skills Education) will improve Outcome: Completed/Met Goal: Individualized Educational Video(s) Outcome: Completed/Met   Problem: Coping: Goal: Ability to adjust to condition or change in health will improve Outcome: Completed/Met   Problem: Fluid Volume: Goal: Ability to maintain a balanced intake and output will improve Outcome: Completed/Met   Problem: Health Behavior/Discharge Planning: Goal: Ability to identify and utilize available resources and services will improve Outcome: Completed/Met Goal: Ability to manage health-related needs will improve Outcome: Completed/Met   Problem: Metabolic: Goal: Ability to maintain appropriate glucose levels will improve Outcome: Completed/Met   Problem: Nutritional: Goal: Maintenance of adequate nutrition will improve Outcome: Completed/Met Goal: Progress toward achieving an optimal weight will improve Outcome: Completed/Met   Problem: Skin Integrity: Goal: Risk for impaired skin integrity will decrease Outcome: Completed/Met   Problem: Tissue Perfusion: Goal: Adequacy of tissue perfusion will improve Outcome: Completed/Met   Problem: Education: Goal: Knowledge of General Education information will improve Description: Including pain rating scale, medication(s)/side effects and non-pharmacologic comfort measures Outcome: Completed/Met   Problem: Health Behavior/Discharge Planning: Goal: Ability to manage health-related needs will improve Outcome: Completed/Met   Problem: Clinical Measurements: Goal: Ability to maintain clinical measurements within normal limits will improve Outcome: Completed/Met Goal: Will remain free from infection Outcome: Completed/Met Goal: Diagnostic test results will improve Outcome: Completed/Met Goal: Respiratory complications will improve Outcome: Completed/Met Goal:  Cardiovascular complication will be avoided Outcome: Completed/Met   Problem: Activity: Goal: Risk for activity intolerance will decrease Outcome: Completed/Met   Problem: Nutrition: Goal: Adequate nutrition will be maintained Outcome: Completed/Met   Problem: Coping: Goal: Level of anxiety will decrease Outcome: Completed/Met   Problem: Elimination: Goal: Will not experience complications related to bowel motility Outcome: Completed/Met Goal: Will not experience complications related to urinary retention Outcome: Completed/Met   Problem: Pain Managment: Goal: General experience of comfort will improve Outcome: Completed/Met   Problem: Safety: Goal: Ability to remain free from injury will improve Outcome: Completed/Met   Problem: Skin Integrity: Goal: Risk for impaired skin integrity will decrease Outcome: Completed/Met   Problem: Education: Goal: Knowledge of disease or condition will improve Outcome: Completed/Met Goal: Understanding of medication regimen will improve Outcome: Completed/Met Goal: Individualized Educational Video(s) Outcome: Completed/Met   Problem: Activity: Goal: Ability to tolerate increased activity will improve Outcome: Completed/Met   Problem: Cardiac: Goal: Ability to achieve and maintain adequate cardiopulmonary perfusion will improve Outcome: Completed/Met   Problem: Health Behavior/Discharge Planning: Goal: Ability to safely manage health-related needs after discharge will improve Outcome: Completed/Met   Problem: Education: Goal: Ability to identify signs and symptoms of gastrointestinal bleeding will improve Outcome: Completed/Met   Problem: Bowel/Gastric: Goal: Will show no signs and symptoms of gastrointestinal bleeding Outcome: Completed/Met   Problem: Fluid Volume: Goal: Will show no signs and symptoms of excessive bleeding Outcome: Completed/Met   Problem: Clinical Measurements: Goal: Complications related to the  disease process, condition or treatment will be avoided or minimized Outcome: Completed/Met

## 2022-05-10 NOTE — Care Management (Addendum)
Eliquis cards sent to unit, nurse and charge nurse aware they need to get it from tube sation and give to patient, instruction for use on card. [facility] notified of DC Sent message to Dr Duanne Limerick to place [facility] PT OT F2F order

## 2022-05-11 ENCOUNTER — Telehealth (HOSPITAL_BASED_OUTPATIENT_CLINIC_OR_DEPARTMENT_OTHER): Payer: Self-pay

## 2022-05-11 NOTE — Transitions of Care (Post Inpatient/ED Visit) (Unsigned)
   05/11/2022  Name: [name] MRN: [id number] DOB: [birth year]  Today's TOC FU Call Status: Unsuccessful Call (1st Attempt) Date: 05/11/22  Attempted to reach the patient regarding the most recent Inpatient/ED visit.  Follow Up Plan: Additional outreach attempts will be made to reach the patient to complete the Transitions of Care (Post Inpatient/ED visit) call.   [place]a P[place] LPN [place] Advisor Direct Dial [phone]

## 2022-05-12 ENCOUNTER — Other Ambulatory Visit (HOSPITAL_BASED_OUTPATIENT_CLINIC_OR_DEPARTMENT_OTHER): Payer: Self-pay

## 2022-05-12 DIAGNOSIS — I872 Venous insufficiency (chronic) (peripheral): Secondary | ICD-10-CM | POA: Diagnosis not present

## 2022-05-12 DIAGNOSIS — M109 Gout, unspecified: Secondary | ICD-10-CM | POA: Diagnosis not present

## 2022-05-12 DIAGNOSIS — I129 Hypertensive chronic kidney disease with stage 1 through stage 4 chronic kidney disease, or unspecified chronic kidney disease: Secondary | ICD-10-CM | POA: Diagnosis not present

## 2022-05-12 DIAGNOSIS — K269 Duodenal ulcer, unspecified as acute or chronic, without hemorrhage or perforation: Secondary | ICD-10-CM | POA: Diagnosis not present

## 2022-05-12 DIAGNOSIS — E1151 Type 2 diabetes mellitus with diabetic peripheral angiopathy without gangrene: Secondary | ICD-10-CM | POA: Diagnosis not present

## 2022-05-12 DIAGNOSIS — R634 Abnormal weight loss: Secondary | ICD-10-CM | POA: Diagnosis not present

## 2022-05-12 DIAGNOSIS — N400 Enlarged prostate without lower urinary tract symptoms: Secondary | ICD-10-CM | POA: Diagnosis not present

## 2022-05-12 DIAGNOSIS — E785 Hyperlipidemia, unspecified: Secondary | ICD-10-CM | POA: Diagnosis not present

## 2022-05-12 DIAGNOSIS — K317 Polyp of stomach and duodenum: Secondary | ICD-10-CM | POA: Diagnosis not present

## 2022-05-12 DIAGNOSIS — Z7901 Long term (current) use of anticoagulants: Secondary | ICD-10-CM | POA: Diagnosis not present

## 2022-05-12 DIAGNOSIS — E349 Endocrine disorder, unspecified: Secondary | ICD-10-CM

## 2022-05-12 DIAGNOSIS — Z85820 Personal history of malignant melanoma of skin: Secondary | ICD-10-CM | POA: Diagnosis not present

## 2022-05-12 DIAGNOSIS — I951 Orthostatic hypotension: Secondary | ICD-10-CM | POA: Diagnosis not present

## 2022-05-12 DIAGNOSIS — Z85828 Personal history of other malignant neoplasm of skin: Secondary | ICD-10-CM | POA: Diagnosis not present

## 2022-05-12 DIAGNOSIS — N179 Acute kidney failure, unspecified: Secondary | ICD-10-CM | POA: Diagnosis not present

## 2022-05-12 DIAGNOSIS — E119 Type 2 diabetes mellitus without complications: Secondary | ICD-10-CM | POA: Diagnosis not present

## 2022-05-12 DIAGNOSIS — E875 Hyperkalemia: Secondary | ICD-10-CM | POA: Diagnosis not present

## 2022-05-12 DIAGNOSIS — R410 Disorientation, unspecified: Secondary | ICD-10-CM | POA: Diagnosis not present

## 2022-05-12 DIAGNOSIS — F419 Anxiety disorder, unspecified: Secondary | ICD-10-CM | POA: Diagnosis not present

## 2022-05-12 DIAGNOSIS — N1832 Chronic kidney disease, stage 3b: Secondary | ICD-10-CM | POA: Diagnosis not present

## 2022-05-12 DIAGNOSIS — Z8619 Personal history of other infectious and parasitic diseases: Secondary | ICD-10-CM | POA: Diagnosis not present

## 2022-05-12 DIAGNOSIS — D62 Acute posthemorrhagic anemia: Secondary | ICD-10-CM | POA: Diagnosis not present

## 2022-05-12 DIAGNOSIS — K5900 Constipation, unspecified: Secondary | ICD-10-CM | POA: Diagnosis not present

## 2022-05-12 DIAGNOSIS — I4819 Other persistent atrial fibrillation: Secondary | ICD-10-CM | POA: Diagnosis not present

## 2022-05-12 DIAGNOSIS — Z7984 Long term (current) use of oral hypoglycemic drugs: Secondary | ICD-10-CM | POA: Diagnosis not present

## 2022-05-12 DIAGNOSIS — M310 Hypersensitivity angiitis: Secondary | ICD-10-CM | POA: Diagnosis not present

## 2022-05-12 MED ORDER — GABAPENTIN 100 MG PO CAPS: 100.0000 mg | ORAL_CAPSULE | Freq: Three times a day (TID) | ORAL | 1 refills | Status: DC

## 2022-05-12 NOTE — Transitions of Care (Post Inpatient/ED Visit) (Unsigned)
   05/12/2022  Name: [name] MRN: [id number] DOB: 03-28-1945  Today's TOC FU Call Status: Today's TOC FU Call Status:: Unsuccessful Call (2nd Attempt) Unsuccessful Call (1st Attempt) Date: 05/11/22 Unsuccessful Call (2nd Attempt) Date: 05/12/22  Attempted to reach the patient regarding the most recent Inpatient/ED visit.  Follow Up Plan: Additional outreach attempts will be made to reach the patient to complete the Transitions of Care (Post Inpatient/ED visit) call.   [place] LPN [place] Advisor Direct Dial [phone]

## 2022-05-13 NOTE — Transitions of Care (Post Inpatient/ED Visit) (Signed)
   05/13/2022  Name: [name] MRN: [id number] DOB: 1945-07-03  Today's TOC FU Call Status: Today's TOC FU Call Status:: Unsuccessful Call (3rd Attempt) Unsuccessful Call (1st Attempt) Date: 05/11/22 Unsuccessful Call (2nd Attempt) Date: 05/12/22 Unsuccessful Call (3rd Attempt) Date: 05/13/22  Attempted to reach the patient regarding the most recent Inpatient/ED visit.  Follow Up Plan: No further outreach attempts will be made at this time. We have been unable to contact the patient.  Pt has fu appt with PCP 05-18-22  [place] LPN [place] Direct Dial [phone]

## 2022-05-14 DIAGNOSIS — K269 Duodenal ulcer, unspecified as acute or chronic, without hemorrhage or perforation: Secondary | ICD-10-CM | POA: Diagnosis not present

## 2022-05-14 DIAGNOSIS — K317 Polyp of stomach and duodenum: Secondary | ICD-10-CM | POA: Diagnosis not present

## 2022-05-14 DIAGNOSIS — I129 Hypertensive chronic kidney disease with stage 1 through stage 4 chronic kidney disease, or unspecified chronic kidney disease: Secondary | ICD-10-CM | POA: Diagnosis not present

## 2022-05-14 DIAGNOSIS — M310 Hypersensitivity angiitis: Secondary | ICD-10-CM | POA: Diagnosis not present

## 2022-05-14 DIAGNOSIS — D62 Acute posthemorrhagic anemia: Secondary | ICD-10-CM | POA: Diagnosis not present

## 2022-05-14 DIAGNOSIS — N179 Acute kidney failure, unspecified: Secondary | ICD-10-CM | POA: Diagnosis not present

## 2022-05-15 DIAGNOSIS — M310 Hypersensitivity angiitis: Secondary | ICD-10-CM | POA: Diagnosis not present

## 2022-05-15 DIAGNOSIS — D62 Acute posthemorrhagic anemia: Secondary | ICD-10-CM | POA: Diagnosis not present

## 2022-05-15 DIAGNOSIS — K317 Polyp of stomach and duodenum: Secondary | ICD-10-CM | POA: Diagnosis not present

## 2022-05-15 DIAGNOSIS — N179 Acute kidney failure, unspecified: Secondary | ICD-10-CM | POA: Diagnosis not present

## 2022-05-15 DIAGNOSIS — I129 Hypertensive chronic kidney disease with stage 1 through stage 4 chronic kidney disease, or unspecified chronic kidney disease: Secondary | ICD-10-CM | POA: Diagnosis not present

## 2022-05-15 DIAGNOSIS — K269 Duodenal ulcer, unspecified as acute or chronic, without hemorrhage or perforation: Secondary | ICD-10-CM | POA: Diagnosis not present

## 2022-05-18 ENCOUNTER — Encounter (HOSPITAL_BASED_OUTPATIENT_CLINIC_OR_DEPARTMENT_OTHER): Payer: Self-pay | Admitting: Family Medicine

## 2022-05-18 ENCOUNTER — Ambulatory Visit (INDEPENDENT_AMBULATORY_CARE_PROVIDER_SITE_OTHER): Payer: Medicare Other | Admitting: Family Medicine

## 2022-05-18 VITALS — BP 106/70 | HR 81 | Ht 75.0 in | Wt 203.2 lb

## 2022-05-18 DIAGNOSIS — D649 Anemia, unspecified: Secondary | ICD-10-CM

## 2022-05-18 DIAGNOSIS — N1832 Chronic kidney disease, stage 3b: Secondary | ICD-10-CM | POA: Diagnosis not present

## 2022-05-18 DIAGNOSIS — I10 Essential (primary) hypertension: Secondary | ICD-10-CM

## 2022-05-18 DIAGNOSIS — K269 Duodenal ulcer, unspecified as acute or chronic, without hemorrhage or perforation: Secondary | ICD-10-CM

## 2022-05-18 DIAGNOSIS — I4821 Permanent atrial fibrillation: Secondary | ICD-10-CM | POA: Diagnosis not present

## 2022-05-18 NOTE — Assessment & Plan Note (Signed)
Patient has had difficulty with achieving stable INR in the past.  During recent hospitalization, he was transitioned to Eliquis.  He has been tolerating the medication without issue.  No current reported bleeding concerns We will continue with Eliquis at this time

## 2022-05-18 NOTE — Assessment & Plan Note (Signed)
Creatinine was elevated above baseline during recent hospitalization.  It did return to baseline with inpatient treatment.  Denies any significant urinary issues at present We will repeat BMP today to monitor kidney function and electrolytes since discharge

## 2022-05-18 NOTE — Assessment & Plan Note (Signed)
During hospitalization, patient was found to have low blood pressure.  Blood pressure managed with use of midodrine and fludrocortisone.  He continues with these medications.  In the office today, blood pressure is normal, low and.  He still occasionally has symptoms of orthostasis.  He has been taking his time with postural changes in order to limit risk of fall.  He has not had recent follow-up with cardiology, recommend following up with specialist for further review of blood pressure and to assist with medication management

## 2022-05-18 NOTE — Progress Notes (Signed)
    Procedures performed today:    None.  Independent interpretation of notes and tests performed by another provider:   None.  Brief History, Exam, Impression, and Recommendations:    BP 106/70 (BP Location: Right Arm, Patient Position: Sitting, Cuff Size: Large)   Pulse 81   Ht '6\' 3"'$  (1.905 m)   Wt 203 lb 3.2 oz (92.2 kg)   SpO2 100%   BMI 25.40 kg/m   Patient presents for follow-up from recent hospitalization.  He is accompanied today by his wife. Patient had recent hospitalization pertaining to symptomatic anemia.  This was found to be related to duodenal ulcer.  During hospitalization, he did additionally have issues with supratherapeutic INR, AKI on CKD.  Duodenal ulcer Patient did have a EGD during hospitalization, no active bleeding was noted.  He was recommended to continue on PPI with follow-up with GI as an outpatient and repeat EGD in about 6 to 8 weeks.  He does have appointment scheduled with GI.  He currently feels that he has been doing well.  Denies any changes in bowel movements, no black or tarry stool noted.  Recommend continuing with GI follow-up and likely repeat EGD with gastroenterologist  Permanent atrial fibrillation [facility]) Patient has had difficulty with achieving stable INR in the past.  During recent hospitalization, he was transitioned to Eliquis.  He has been tolerating the medication without issue.  No current reported bleeding concerns We will continue with Eliquis at this time  Chronic kidney disease Creatinine was elevated above baseline during recent hospitalization.  It did return to baseline with inpatient treatment.  Denies any significant urinary issues at present We will repeat BMP today to monitor kidney function and electrolytes since discharge  Hypertension During hospitalization, patient was found to have low blood pressure.  Blood pressure managed with use of midodrine and fludrocortisone.  He continues with these medications.  In the  office today, blood pressure is normal, low and.  He still occasionally has symptoms of orthostasis.  He has been taking his time with postural changes in order to limit risk of fall.  He has not had recent follow-up with cardiology, recommend following up with specialist for further review of blood pressure and to assist with medication management  Patient has had some deconditioning as a result of recent hospitalization.  He will be having physical therapy in the near future.  Recommend continuing with this to assist with recovery process following discharge from the hospital  Return in about 2 months (around 07/18/2022).  Plan follow-up in about 2 to 3 months  Spent 33 minutes on this patient encounter, including preparation, chart review, face-to-face counseling with patient and coordination of care, and documentation of encounter   ___________________________________________ Tuana Hoheisel de [place], MD, ABFM, CAQSM [facility] [place]

## 2022-05-18 NOTE — Assessment & Plan Note (Signed)
Patient did have a EGD during hospitalization, no active bleeding was noted.  He was recommended to continue on PPI with follow-up with GI as an outpatient and repeat EGD in about 6 to 8 weeks.  He does have appointment scheduled with GI.  He currently feels that he has been doing well.  Denies any changes in bowel movements, no black or tarry stool noted.  Recommend continuing with GI follow-up and likely repeat EGD with gastroenterologist

## 2022-05-19 ENCOUNTER — Other Ambulatory Visit (HOSPITAL_BASED_OUTPATIENT_CLINIC_OR_DEPARTMENT_OTHER): Payer: Self-pay | Admitting: Family Medicine

## 2022-05-19 DIAGNOSIS — M310 Hypersensitivity angiitis: Secondary | ICD-10-CM | POA: Diagnosis not present

## 2022-05-19 DIAGNOSIS — Z7901 Long term (current) use of anticoagulants: Secondary | ICD-10-CM

## 2022-05-19 DIAGNOSIS — N179 Acute kidney failure, unspecified: Secondary | ICD-10-CM | POA: Diagnosis not present

## 2022-05-19 DIAGNOSIS — D62 Acute posthemorrhagic anemia: Secondary | ICD-10-CM | POA: Diagnosis not present

## 2022-05-19 DIAGNOSIS — K269 Duodenal ulcer, unspecified as acute or chronic, without hemorrhage or perforation: Secondary | ICD-10-CM | POA: Diagnosis not present

## 2022-05-19 DIAGNOSIS — I129 Hypertensive chronic kidney disease with stage 1 through stage 4 chronic kidney disease, or unspecified chronic kidney disease: Secondary | ICD-10-CM | POA: Diagnosis not present

## 2022-05-19 DIAGNOSIS — K317 Polyp of stomach and duodenum: Secondary | ICD-10-CM | POA: Diagnosis not present

## 2022-05-19 LAB — CBC WITH DIFFERENTIAL/PLATELET
Basophils Absolute: 0.1 10*3/uL (ref 0.0–0.2)
Basos: 1 %
EOS (ABSOLUTE): 0.1 10*3/uL (ref 0.0–0.4)
Eos: 1 %
Hematocrit: 29.1 % — ABNORMAL LOW (ref 37.5–51.0)
Hemoglobin: 9.1 g/dL — ABNORMAL LOW (ref 13.0–17.7)
Immature Grans (Abs): 0.1 10*3/uL (ref 0.0–0.1)
Immature Granulocytes: 1 %
Lymphocytes Absolute: 1 10*3/uL (ref 0.7–3.1)
Lymphs: 10 %
MCH: 30.2 pg (ref 26.6–33.0)
MCHC: 31.3 g/dL — ABNORMAL LOW (ref 31.5–35.7)
MCV: 97 fL (ref 79–97)
Monocytes Absolute: 1 10*3/uL — ABNORMAL HIGH (ref 0.1–0.9)
Monocytes: 10 %
Neutrophils Absolute: 7.6 10*3/uL — ABNORMAL HIGH (ref 1.4–7.0)
Neutrophils: 77 %
Platelets: 270 10*3/uL (ref 150–450)
RBC: 3.01 x10E6/uL — ABNORMAL LOW (ref 4.14–5.80)
RDW: 16.8 % — ABNORMAL HIGH (ref 11.6–15.4)
WBC: 9.8 10*3/uL (ref 3.4–10.8)

## 2022-05-19 LAB — BASIC METABOLIC PANEL
BUN/Creatinine Ratio: 17 (ref 10–24)
BUN: 32 mg/dL — ABNORMAL HIGH (ref 8–27)
CO2: 21 mmol/L (ref 20–29)
Calcium: 8.5 mg/dL — ABNORMAL LOW (ref 8.6–10.2)
Chloride: 106 mmol/L (ref 96–106)
Creatinine, Ser: 1.88 mg/dL — ABNORMAL HIGH (ref 0.76–1.27)
Glucose: 111 mg/dL — ABNORMAL HIGH (ref 70–99)
Potassium: 5 mmol/L (ref 3.5–5.2)
Sodium: 145 mmol/L — ABNORMAL HIGH (ref 134–144)
eGFR: 37 mL/min/{1.73_m2} — ABNORMAL LOW (ref >59)

## 2022-05-20 DIAGNOSIS — M79674 Pain in right toe(s): Secondary | ICD-10-CM | POA: Diagnosis not present

## 2022-05-20 DIAGNOSIS — M79675 Pain in left toe(s): Secondary | ICD-10-CM | POA: Diagnosis not present

## 2022-05-20 DIAGNOSIS — M2041 Other hammer toe(s) (acquired), right foot: Secondary | ICD-10-CM | POA: Diagnosis not present

## 2022-05-20 DIAGNOSIS — M2042 Other hammer toe(s) (acquired), left foot: Secondary | ICD-10-CM | POA: Diagnosis not present

## 2022-05-20 DIAGNOSIS — B351 Tinea unguium: Secondary | ICD-10-CM | POA: Insufficient documentation

## 2022-05-21 ENCOUNTER — Ambulatory Visit (HOSPITAL_BASED_OUTPATIENT_CLINIC_OR_DEPARTMENT_OTHER): Payer: Medicare Other | Admitting: Family Medicine

## 2022-05-22 DIAGNOSIS — D62 Acute posthemorrhagic anemia: Secondary | ICD-10-CM | POA: Diagnosis not present

## 2022-05-22 DIAGNOSIS — M310 Hypersensitivity angiitis: Secondary | ICD-10-CM | POA: Diagnosis not present

## 2022-05-22 DIAGNOSIS — K269 Duodenal ulcer, unspecified as acute or chronic, without hemorrhage or perforation: Secondary | ICD-10-CM | POA: Diagnosis not present

## 2022-05-22 DIAGNOSIS — I129 Hypertensive chronic kidney disease with stage 1 through stage 4 chronic kidney disease, or unspecified chronic kidney disease: Secondary | ICD-10-CM | POA: Diagnosis not present

## 2022-05-22 DIAGNOSIS — K317 Polyp of stomach and duodenum: Secondary | ICD-10-CM | POA: Diagnosis not present

## 2022-05-22 DIAGNOSIS — N179 Acute kidney failure, unspecified: Secondary | ICD-10-CM | POA: Diagnosis not present

## 2022-05-22 LAB — GLUCOSE, CAPILLARY
Glucose-Capillary: 232 mg/dL — ABNORMAL HIGH (ref 70–99)
Glucose-Capillary: 182 mg/dL — ABNORMAL HIGH (ref 70–99)
Glucose-Capillary: 159 mg/dL — ABNORMAL HIGH (ref 70–99)
Glucose-Capillary: 285 mg/dL — ABNORMAL HIGH (ref 70–99)
Glucose-Capillary: 235 mg/dL — ABNORMAL HIGH (ref 70–99)
Glucose-Capillary: 145 mg/dL — ABNORMAL HIGH (ref 70–99)
Glucose-Capillary: 360 mg/dL — ABNORMAL HIGH (ref 70–99)
Glucose-Capillary: 144 mg/dL — ABNORMAL HIGH (ref 70–99)
Glucose-Capillary: 253 mg/dL — ABNORMAL HIGH (ref 70–99)

## 2022-05-25 DIAGNOSIS — L97222 Non-pressure chronic ulcer of left calf with fat layer exposed: Secondary | ICD-10-CM | POA: Diagnosis not present

## 2022-05-25 DIAGNOSIS — I83028 Varicose veins of left lower extremity with ulcer other part of lower leg: Secondary | ICD-10-CM | POA: Diagnosis not present

## 2022-05-25 DIAGNOSIS — E11622 Type 2 diabetes mellitus with other skin ulcer: Secondary | ICD-10-CM | POA: Diagnosis not present

## 2022-05-25 DIAGNOSIS — E11621 Type 2 diabetes mellitus with foot ulcer: Secondary | ICD-10-CM | POA: Diagnosis not present

## 2022-05-25 DIAGNOSIS — I83022 Varicose veins of left lower extremity with ulcer of calf: Secondary | ICD-10-CM | POA: Diagnosis not present

## 2022-05-25 DIAGNOSIS — M310 Hypersensitivity angiitis: Secondary | ICD-10-CM | POA: Diagnosis not present

## 2022-05-25 DIAGNOSIS — L97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: Secondary | ICD-10-CM | POA: Diagnosis not present

## 2022-05-25 DIAGNOSIS — L97512 Non-pressure chronic ulcer of other part of right foot with fat layer exposed: Secondary | ICD-10-CM | POA: Diagnosis not present

## 2022-05-25 DIAGNOSIS — L97822 Non-pressure chronic ulcer of other part of left lower leg with fat layer exposed: Secondary | ICD-10-CM | POA: Diagnosis not present

## 2022-05-25 DIAGNOSIS — L89623 Pressure ulcer of left heel, stage 3: Secondary | ICD-10-CM | POA: Diagnosis not present

## 2022-05-25 DIAGNOSIS — L97828 Non-pressure chronic ulcer of other part of left lower leg with other specified severity: Secondary | ICD-10-CM | POA: Diagnosis not present

## 2022-05-25 DIAGNOSIS — L97522 Non-pressure chronic ulcer of other part of left foot with fat layer exposed: Secondary | ICD-10-CM | POA: Diagnosis not present

## 2022-05-26 DIAGNOSIS — I129 Hypertensive chronic kidney disease with stage 1 through stage 4 chronic kidney disease, or unspecified chronic kidney disease: Secondary | ICD-10-CM | POA: Diagnosis not present

## 2022-05-26 DIAGNOSIS — K317 Polyp of stomach and duodenum: Secondary | ICD-10-CM | POA: Diagnosis not present

## 2022-05-26 DIAGNOSIS — N179 Acute kidney failure, unspecified: Secondary | ICD-10-CM | POA: Diagnosis not present

## 2022-05-26 DIAGNOSIS — K269 Duodenal ulcer, unspecified as acute or chronic, without hemorrhage or perforation: Secondary | ICD-10-CM | POA: Diagnosis not present

## 2022-05-26 DIAGNOSIS — D62 Acute posthemorrhagic anemia: Secondary | ICD-10-CM | POA: Diagnosis not present

## 2022-05-26 DIAGNOSIS — M310 Hypersensitivity angiitis: Secondary | ICD-10-CM | POA: Diagnosis not present

## 2022-05-26 NOTE — Progress Notes (Unsigned)
[place], [address], [place]  [address] Phone: [phone] Fax:  [phone]  Date:  05/27/2022   ID:  Harbert, Demichele January 30, 1946, MRN [id number]  PCP:  de [place], Raymond J, MD  Cardiologist:  Dr. Aiyannah Fayad [place]     History of Present Illness: [name] is a 77 y.o. male seen for follow up Afib. He has a  hx of permanent AFib, mod MR, pulmonary HTN, HTN, HL, T2DM.  Echo (09/2011):  EF 55%, mod MR, severe LAE, severe RAE, PASP 57-61, trivial eff. Apparently  had Afib dating back to the 1970s while in the service in [place].   He underwent lap cholecystectomy in 2016. In 2018 he was admitted with hyperkalemia and AKI that responded to hydration.   LE vascular dopplers done with [facility] in January showed ...  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 He was treated with IV antibiotics. He had been given vitamin K and advised to hold his Coumadin with plans to start Eliquis once INR trended down to 2.  He had been discharged home on Keflex.   He was readmitted to our hospital on 12/18-12/25/24 with syncope and upper GI bleed. He had black stools.  He was orthostatic and had AKI, hyperkalemia.GI team was consulted to assist with patient's management. Patient underwent EGD on 05/04/2022. EGD revealed polyps of the stomach and a nonbleeding duodenal ulcer. He was transfused 4 units of PRBCs. GI planned relook in 6-8 weeks. He was hydrated with return of renal function to baseline. He was DC on Eliquis. Echo during his stay (study done with high HR in AFib) showed normal EF. Moderate RV enlargement, moderate pulmonary HTN, severe MR and severe biatrial enlargement.  Patient's anticoagulation was restarted. He was on midodrine and Florinef for orthostasis.   On follow up with PCP on March 4 Hgb improved to 9.1 and renal function was  back to baseline. He is still weak. Getting home PT. No further syncope. Denies any dyspnea or increased swelling. Still getting wraps on LE at wound center. Notes he has lost 40 lbs since beginning of year with poor appetite and medical illnesses.  Recent Labs: 05/03/2022: ALT 21 05/18/2022: Creatinine, Ser 1.88; Hemoglobin 9.1; Potassium 5.0  Wt Readings from Last 3 Encounters:  05/27/22 213 lb 9.6 oz (96.9 kg)  05/18/22 203 lb 3.2 oz (92.2 kg)  05/03/22 213 lb (96.6 kg)     Past Medical History:  Diagnosis Date   Atrial fibrillation (HCC)    Chronic anticoagulation    Complication of anesthesia    ' hard time waking up" per patient    Diabetes mellitus    type 2   GERD (gastroesophageal reflux disease)    Hyperlipidemia    Hypertension    Mitral regurgitation    a. 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HCC)    chronic atrial fib   Pulmonary hypertension (HCC)    Pulmonary hypertension (HCC)     Current Outpatient Medications  Medication Sig Dispense Refill   acetaminophen (TYLENOL) 500 MG tablet Take 1,000 mg by mouth as needed for mild pain.     allopurinol (ZYLOPRIM) 100 MG tablet TAKE 1 TABLET BY MOUTH TWICE A DAY 120 tablet 0   apixaban (ELIQUIS) 5 MG TABS tablet Take  1 tablet (5 mg total) by mouth 2 (two) times daily. 60 tablet 0   famotidine (PEPCID) 20 MG tablet TAKE 1 TABLET BY MOUTH DAILY - KEEP APPOINTMENT FOR FURTHER REFILLS (Patient taking differently: Take 20 mg by mouth daily.) 90 tablet 3   feeding supplement (ENSURE ENLIVE / ENSURE PLUS) LIQD Take 237 mLs by mouth 2 (two) times daily between meals. 237 mL 12   glipiZIDE (GLUCOTROL XL) 2.5 MG 24 hr tablet TAKE 1 TABLET BY MOUTH DAILY WITH BREAKFAST 90 tablet 1   Glucose Blood (FREESTYLE LITE TEST VI)      Iron, Ferrous Sulfate, 325 (65 Fe) MG TABS Take 325 mg by mouth daily. 30 tablet 2   lidocaine (LIDODERM) 5 % Place 1 patch onto the  skin as needed. Remove & Discard patch within 12 hours or as directed by MD     metoprolol tartrate (LOPRESSOR) 25 MG tablet Take 0.5 tablets (12.5 mg total) by mouth 2 (two) times daily. 30 tablet 0   Multiple Vitamin (MULTIVITAMIN WITH MINERALS) TABS tablet Take 1 tablet by mouth daily. 30 tablet 0   pantoprazole (PROTONIX) 40 MG tablet Take 1 tablet (40 mg total) by mouth daily. 30 tablet 0   predniSONE (DELTASONE) 10 MG tablet Take 0.5 tablets (5 mg total) by mouth daily with breakfast. 15 tablet 1   silver sulfADIAZINE (SILVADENE) 1 % cream Apply topically.     simvastatin (ZOCOR) 20 MG tablet Take 1 tablet (20 mg total) by mouth at bedtime. 90 tablet 0   tamsulosin (FLOMAX) 0.4 MG CAPS capsule Take 0.4 mg by mouth daily.     fludrocortisone (FLORINEF) 0.1 MG tablet Take 1 tablet (0.1 mg total) by mouth daily. 30 tablet 3   midodrine (PROAMATINE) 10 MG tablet Take 1 tablet (10 mg total) by mouth 3 (three) times daily with meals. 90 tablet 3   No current facility-administered medications for this visit.    Allergies:   Atorvastatin and Lisinopril   Social History:  The patient  reports that he has never smoked. He has never used smokeless tobacco. He reports that he does not currently use alcohol. He reports that he does not use drugs.   Family History:  The patient's family history includes Alzheimer's disease in his mother; Diabetes in his brother and mother; Heart disease in his father; Hyperlipidemia in his brother; Hypertension in his mother.   ROS:  Please see the history of present illness.      All other systems reviewed and negative.   PHYSICAL EXAM: VS:  BP 106/60   Pulse (!) 59   Ht '6\' 3"'$  (1.905 m)   Wt 213 lb 9.6 oz (96.9 kg)   SpO2 99%   BMI 26.70 kg/m  GENERAL:  Well appearing WM in NAD HEENT:  PERRL, EOMI, sclera are clear. Oropharynx is clear. NECK:  No jugular venous distention, carotid upstroke brisk and symmetric, no bruits, no thyromegaly or adenopathy Back is  kyphotic.  HEART:  IRRR,  PMI not displaced or sustained,S1 and S2 within normal limits, no S3, no S4: no clicks, no rubs, 2/6 systolic systolic murmur at the apex ABD:  Soft, nontender. BS +, no masses or bruits. No hepatomegaly, no splenomegaly EXT:  2 legs completely wrapped.  SKIN:  Warm and dry.  No rashes NEURO:  Alert and oriented x 3. Cranial nerves II through XII intact. PSYCH:  Cognitively intact    Laboratory data: Lab Results  Component Value Date   WBC 9.8  05/18/2022   HGB 9.1 (L) 05/18/2022   HCT 29.1 (L) 05/18/2022   PLT 270 05/18/2022   GLUCOSE 111 (H) 05/18/2022   ALT 21 05/03/2022   AST 20 05/03/2022   NA 145 (H) 05/18/2022   K 5.0 05/18/2022   CL 106 05/18/2022   CREATININE 1.88 (H) 05/18/2022   BUN 32 (H) 05/18/2022   CO2 21 05/18/2022   INR 1.4 (H) 05/05/2022   HGBA1C 6.7 (H) 03/24/2022   MICROALBUR 150 01/23/2021   Dated 10/19/17: cholesterol 145, triglycerides 125, HDL 38, LDL 82. A1c 7.5%. Hgb 13.1. creatinine 1.71. other chemistries and TSH normal.  EKG:  Today- AFib, HR 64. No acute ST changes. I have personally reviewed and interpreted this study.  Echo 12/24/14: Study Conclusions   - Left ventricle: The cavity size was mildly dilated. Wall   thickness was normal. Systolic function was normal. The estimated   ejection fraction was in the range of 60% to 65%. - Mitral valve: There was moderate regurgitation. - Left atrium: The atrium was severely dilated. - Right ventricle: The cavity size was moderately dilated. Wall   thickness was normal. - Right atrium: The atrium was moderately dilated  Echo 05/31/20: IMPRESSIONS     1. Left ventricular ejection fraction, by estimation, is 60 to 65%. The  left ventricle has normal function. The left ventricle has no regional  wall motion abnormalities. There is mild left ventricular hypertrophy.  Left ventricular diastolic function  could not be evaluated.   2. Right ventricular systolic function is  normal. The right ventricular  size is normal. There is severely elevated pulmonary artery systolic  pressure. The estimated right ventricular systolic pressure is 99991111 mmHg.   3. Left atrial size was severely dilated.   4. Right atrial size was moderately dilated.   5. The mitral valve is abnormal. Moderate to severe mitral valve  regurgitation.   6. Eccentric TR jet. The tricuspid valve is abnormal. Tricuspid valve  regurgitation is moderate.   7. The aortic valve is tricuspid. Aortic valve regurgitation is trivial.   8. The inferior vena cava is dilated in size with <50% respiratory  variability, suggesting right atrial pressure of 15 mmHg.   Comparison(s): Changes from prior study are noted. 05/31/19 EF 60-65%.  Moderate-severe MR, RVSP 50 mmHg.   Echo 05/05/22: IMPRESSIONS     1. Left ventricular ejection fraction, by estimation, is 70 to 75%. The  left ventricle has hyperdynamic function. The left ventricle has no  regional wall motion abnormalities. Left ventricular diastolic parameters  are indeterminate.   2. Right ventricular systolic function is normal. The right ventricular  size is moderately enlarged. There is moderately elevated pulmonary artery  systolic pressure. The estimated right ventricular systolic pressure is  123456 mmHg.   3. Left atrial size was massively dilated.   4. Right atrial size was severely dilated.   5. The mitral valve is normal in structure. Severe mitral valve  regurgitation.   6. Tricuspid valve regurgitation is severe.   7. The aortic valve is tricuspid. Aortic valve regurgitation is trivial.   8. Pulmonic valve regurgitation is moderate.   9. The inferior vena cava is normal in size with <50% respiratory  variability, suggesting right atrial pressure of 8 mmHg.   Comparison(s): LV is more vigorous from prior, imaged at 140 bpm + during  this study.   ASSESSMENT AND PLAN:  Atrial Fibrillation- chronic and permanent:  Rate is well  controlled on lower dose of metoprolol  now.  Now on Eliquis since Coumadin difficult to regulate.  Mitral Regurgitation: moderate- severe. I personally reviewed his recent Echo. 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 He has had AFib since 1970s. His LV function is good. He is not really symptomatic. The pulmonary HTN noted on Echo has been there at least 20 years.Will continue current therapy. Currently on no diuretics.  Hypertension:  by history. Now with orthostatic hypotension. On florinef and midodrine. Hopefully this will improve with normalization of anemia. Hyperlipidemia:  Continue statin. Reports lab done at [facility] within the last year. Anemia due to acute blood loss. Recommend he add Fe supplement 325 mg daily CKD stage 3b. Followed by Nephrology. Duodenal ulcer. Per GI.   I will follow up in 3 months.   Signed, Yoceline Bazar [place] MD, [facility]    05/27/2022 9:49 AM

## 2022-05-27 ENCOUNTER — Encounter: Payer: Self-pay | Admitting: Cardiology

## 2022-05-27 ENCOUNTER — Ambulatory Visit: Payer: Medicare Other | Attending: Cardiology | Admitting: Cardiology

## 2022-05-27 VITALS — BP 106/60 | HR 59 | Ht 75.0 in | Wt 213.6 lb

## 2022-05-27 DIAGNOSIS — I340 Nonrheumatic mitral (valve) insufficiency: Secondary | ICD-10-CM

## 2022-05-27 DIAGNOSIS — I951 Orthostatic hypotension: Secondary | ICD-10-CM | POA: Insufficient documentation

## 2022-05-27 DIAGNOSIS — I482 Chronic atrial fibrillation, unspecified: Secondary | ICD-10-CM | POA: Diagnosis not present

## 2022-05-27 DIAGNOSIS — N1832 Chronic kidney disease, stage 3b: Secondary | ICD-10-CM | POA: Diagnosis not present

## 2022-05-27 MED ORDER — MIDODRINE HCL 10 MG PO TABS: 10.0000 mg | ORAL_TABLET | Freq: Three times a day (TID) | ORAL | 3 refills | Status: DC

## 2022-05-27 MED ORDER — IRON (FERROUS SULFATE) 325 (65 FE) MG PO TABS: 325.0000 mg | ORAL_TABLET | Freq: Every day | ORAL | 2 refills | Status: AC

## 2022-05-27 MED ORDER — FLUDROCORTISONE ACETATE 0.1 MG PO TABS: 0.1000 mg | ORAL_TABLET | Freq: Every day | ORAL | 3 refills | Status: DC

## 2022-05-27 NOTE — Patient Instructions (Signed)
Add iron sulfate 325 mg daily with food.   Continue your other therapy  I will see you in 3 months

## 2022-05-28 DIAGNOSIS — M310 Hypersensitivity angiitis: Secondary | ICD-10-CM | POA: Diagnosis not present

## 2022-05-28 DIAGNOSIS — N179 Acute kidney failure, unspecified: Secondary | ICD-10-CM | POA: Diagnosis not present

## 2022-05-28 DIAGNOSIS — D62 Acute posthemorrhagic anemia: Secondary | ICD-10-CM | POA: Diagnosis not present

## 2022-05-28 DIAGNOSIS — I129 Hypertensive chronic kidney disease with stage 1 through stage 4 chronic kidney disease, or unspecified chronic kidney disease: Secondary | ICD-10-CM | POA: Diagnosis not present

## 2022-05-28 DIAGNOSIS — K317 Polyp of stomach and duodenum: Secondary | ICD-10-CM | POA: Diagnosis not present

## 2022-05-28 DIAGNOSIS — K269 Duodenal ulcer, unspecified as acute or chronic, without hemorrhage or perforation: Secondary | ICD-10-CM | POA: Diagnosis not present

## 2022-05-29 DIAGNOSIS — K317 Polyp of stomach and duodenum: Secondary | ICD-10-CM | POA: Diagnosis not present

## 2022-05-29 DIAGNOSIS — N179 Acute kidney failure, unspecified: Secondary | ICD-10-CM | POA: Diagnosis not present

## 2022-05-29 DIAGNOSIS — K269 Duodenal ulcer, unspecified as acute or chronic, without hemorrhage or perforation: Secondary | ICD-10-CM | POA: Diagnosis not present

## 2022-05-29 DIAGNOSIS — M310 Hypersensitivity angiitis: Secondary | ICD-10-CM | POA: Diagnosis not present

## 2022-05-29 DIAGNOSIS — D62 Acute posthemorrhagic anemia: Secondary | ICD-10-CM | POA: Diagnosis not present

## 2022-05-29 DIAGNOSIS — I129 Hypertensive chronic kidney disease with stage 1 through stage 4 chronic kidney disease, or unspecified chronic kidney disease: Secondary | ICD-10-CM | POA: Diagnosis not present

## 2022-06-01 DIAGNOSIS — L97222 Non-pressure chronic ulcer of left calf with fat layer exposed: Secondary | ICD-10-CM | POA: Diagnosis not present

## 2022-06-01 DIAGNOSIS — L97522 Non-pressure chronic ulcer of other part of left foot with fat layer exposed: Secondary | ICD-10-CM | POA: Diagnosis not present

## 2022-06-01 DIAGNOSIS — I872 Venous insufficiency (chronic) (peripheral): Secondary | ICD-10-CM | POA: Diagnosis not present

## 2022-06-01 DIAGNOSIS — L97512 Non-pressure chronic ulcer of other part of right foot with fat layer exposed: Secondary | ICD-10-CM | POA: Diagnosis not present

## 2022-06-01 DIAGNOSIS — L97212 Non-pressure chronic ulcer of right calf with fat layer exposed: Secondary | ICD-10-CM | POA: Diagnosis not present

## 2022-06-01 DIAGNOSIS — E11622 Type 2 diabetes mellitus with other skin ulcer: Secondary | ICD-10-CM | POA: Diagnosis not present

## 2022-06-01 DIAGNOSIS — L97822 Non-pressure chronic ulcer of other part of left lower leg with fat layer exposed: Secondary | ICD-10-CM | POA: Diagnosis not present

## 2022-06-01 DIAGNOSIS — L97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: Secondary | ICD-10-CM | POA: Diagnosis not present

## 2022-06-01 DIAGNOSIS — E11621 Type 2 diabetes mellitus with foot ulcer: Secondary | ICD-10-CM | POA: Diagnosis not present

## 2022-06-03 ENCOUNTER — Telehealth (HOSPITAL_BASED_OUTPATIENT_CLINIC_OR_DEPARTMENT_OTHER): Payer: Self-pay | Admitting: *Deleted

## 2022-06-03 DIAGNOSIS — I129 Hypertensive chronic kidney disease with stage 1 through stage 4 chronic kidney disease, or unspecified chronic kidney disease: Secondary | ICD-10-CM | POA: Diagnosis not present

## 2022-06-03 DIAGNOSIS — K317 Polyp of stomach and duodenum: Secondary | ICD-10-CM | POA: Diagnosis not present

## 2022-06-03 DIAGNOSIS — N179 Acute kidney failure, unspecified: Secondary | ICD-10-CM | POA: Diagnosis not present

## 2022-06-03 DIAGNOSIS — K269 Duodenal ulcer, unspecified as acute or chronic, without hemorrhage or perforation: Secondary | ICD-10-CM | POA: Diagnosis not present

## 2022-06-03 DIAGNOSIS — D62 Acute posthemorrhagic anemia: Secondary | ICD-10-CM | POA: Diagnosis not present

## 2022-06-03 DIAGNOSIS — M310 Hypersensitivity angiitis: Secondary | ICD-10-CM | POA: Diagnosis not present

## 2022-06-03 NOTE — Telephone Encounter (Signed)
[name] at [facility] had left 2 VM wanting to see if Dr. [place] would allow the patient to take turbenafine 250 mg daily for 90 days for his toenail fungus.  PCP stated that as long as they were comfortable prescribing and had no concerns of interaction then it would be ok. Routing to PCP as an Pharmacist, hospital. [name], CMA

## 2022-06-05 DIAGNOSIS — M310 Hypersensitivity angiitis: Secondary | ICD-10-CM | POA: Diagnosis not present

## 2022-06-05 DIAGNOSIS — I129 Hypertensive chronic kidney disease with stage 1 through stage 4 chronic kidney disease, or unspecified chronic kidney disease: Secondary | ICD-10-CM | POA: Diagnosis not present

## 2022-06-05 DIAGNOSIS — N179 Acute kidney failure, unspecified: Secondary | ICD-10-CM | POA: Diagnosis not present

## 2022-06-05 DIAGNOSIS — K317 Polyp of stomach and duodenum: Secondary | ICD-10-CM | POA: Diagnosis not present

## 2022-06-05 DIAGNOSIS — D62 Acute posthemorrhagic anemia: Secondary | ICD-10-CM | POA: Diagnosis not present

## 2022-06-05 DIAGNOSIS — K269 Duodenal ulcer, unspecified as acute or chronic, without hemorrhage or perforation: Secondary | ICD-10-CM | POA: Diagnosis not present

## 2022-06-06 ENCOUNTER — Other Ambulatory Visit (HOSPITAL_BASED_OUTPATIENT_CLINIC_OR_DEPARTMENT_OTHER): Payer: Self-pay | Admitting: Family Medicine

## 2022-06-08 ENCOUNTER — Encounter: Payer: Self-pay | Admitting: Nurse Practitioner

## 2022-06-08 ENCOUNTER — Ambulatory Visit (INDEPENDENT_AMBULATORY_CARE_PROVIDER_SITE_OTHER): Payer: Medicare Other | Admitting: Nurse Practitioner

## 2022-06-08 ENCOUNTER — Telehealth: Payer: Self-pay

## 2022-06-08 VITALS — BP 126/78 | HR 87 | Ht 75.0 in | Wt 223.0 lb

## 2022-06-08 DIAGNOSIS — K269 Duodenal ulcer, unspecified as acute or chronic, without hemorrhage or perforation: Secondary | ICD-10-CM

## 2022-06-08 DIAGNOSIS — Z1211 Encounter for screening for malignant neoplasm of colon: Secondary | ICD-10-CM

## 2022-06-08 DIAGNOSIS — L97522 Non-pressure chronic ulcer of other part of left foot with fat layer exposed: Secondary | ICD-10-CM | POA: Diagnosis not present

## 2022-06-08 DIAGNOSIS — L97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: Secondary | ICD-10-CM | POA: Diagnosis not present

## 2022-06-08 DIAGNOSIS — L97822 Non-pressure chronic ulcer of other part of left lower leg with fat layer exposed: Secondary | ICD-10-CM | POA: Diagnosis not present

## 2022-06-08 DIAGNOSIS — L97512 Non-pressure chronic ulcer of other part of right foot with fat layer exposed: Secondary | ICD-10-CM | POA: Diagnosis not present

## 2022-06-08 DIAGNOSIS — I83028 Varicose veins of left lower extremity with ulcer other part of lower leg: Secondary | ICD-10-CM | POA: Diagnosis not present

## 2022-06-08 DIAGNOSIS — E11622 Type 2 diabetes mellitus with other skin ulcer: Secondary | ICD-10-CM | POA: Diagnosis not present

## 2022-06-08 DIAGNOSIS — I872 Venous insufficiency (chronic) (peripheral): Secondary | ICD-10-CM | POA: Diagnosis not present

## 2022-06-08 DIAGNOSIS — E11621 Type 2 diabetes mellitus with foot ulcer: Secondary | ICD-10-CM | POA: Diagnosis not present

## 2022-06-08 MED ORDER — SUTAB 1479-225-188 MG PO TABS: 24.0000 | ORAL_TABLET | Freq: Once | ORAL | 0 refills | Status: AC

## 2022-06-08 MED ORDER — NA SULFATE-K SULFATE-MG SULF 17.5-3.13-1.6 GM/177ML PO SOLN: 1.0000 | Freq: Once | ORAL | 0 refills | Status: DC

## 2022-06-08 MED ORDER — PANTOPRAZOLE SODIUM 40 MG PO TBEC: 40.0000 mg | DELAYED_RELEASE_TABLET | Freq: Every day | ORAL | 0 refills | Status: DC

## 2022-06-08 MED ORDER — ALLOPURINOL 100 MG PO TABS: 100.0000 mg | ORAL_TABLET | Freq: Two times a day (BID) | ORAL | 0 refills | Status: DC

## 2022-06-08 NOTE — Telephone Encounter (Signed)
   Primary Cardiologist: Peter [place], MD  Chart reviewed as part of pre-operative protocol coverage. Given past medical history and time since last visit, based on ACC/AHA guidelines, KEJON ORSBORN would be at acceptable risk for the planned procedure without further cardiovascular testing.   Patient was advised that if he develops new symptoms prior to surgery to contact our office to arrange a follow-up appointment.  He verbalized understanding.  He may hold Eliquis for 2 days prior to procedure and should resume as soon as hemodynamically stable.   I will route this recommendation to the requesting party via Epic fax function and remove from pre-op pool.  Please call with questions.  Emmaline Life, NP-C  06/08/2022, 11:52 AM [address], [address] Office [phone] Fax [phone]

## 2022-06-08 NOTE — Patient Instructions (Addendum)
_______________________________________________________  If your blood pressure at your visit was 140/90 or greater, please contact your primary care physician to follow up on this.  _______________________________________________________  If you are age 76 or older, your body mass index should be between 23-30. Your Body mass index is 27.87 kg/m. If this is out of the aforementioned range listed, please consider follow up with your Primary Care Provider.  If you are age 72 or younger, your body mass index should be between 19-25. Your Body mass index is 27.87 kg/m. If this is out of the aformentioned range listed, please consider follow up with your Primary Care Provider.   The [facility] providers would like to encourage you to use [facility] to communicate with providers for non-urgent requests or questions.  Due to long hold times on the telephone, sending your provider a message by [facility] may be a faster and more efficient way to get a response.  Please allow 48 business hours for a response.  Please remember that this is for non-urgent requests.   It was a pleasure to see you today!  Thank you for trusting me with your gastrointestinal care!

## 2022-06-08 NOTE — Telephone Encounter (Signed)
[facility] Pre-operative Risk Assessment     Request for surgical clearance:     Endoscopy Procedure  What type of surgery is being performed?     Colonoscopy and EGD   When is this surgery scheduled?     06/30/22  What type of clearance is required ?   Pharmacy  Are there any medications that need to be held prior to surgery and how long? Eliquis 2 days   Practice name and name of physician performing surgery?      [facility]  What is your office phone and fax number?      Phone- [phone]  [phone]  Anesthesia type (None, local, MAC, general) ?       MAC

## 2022-06-08 NOTE — Progress Notes (Signed)
Assessment   77 yo male with the following:   Recent upper GI bleed / duodenal ulcer.  Ulcer was non-bleeding Biopsies negative for H.pylori  Acute blood loss anemia  Presenting hgb in 6 range. Required several units of blood.  Started on oral iron several days ago Most recent hgb was 9.1 on 05/18/22  Afib.  Recent transition from warfarin to Eliquis   Severe LAE  / moderate to severe MR.   Orthostatic hypotension.  On florinef and midodrine Bp 126/78 today  Bilateral lower extremity cellulitis.   CKD 3   Plan   -Repeat EGD to document ulcer healing.   The risks and benefits of EGD with possible biopsies were discussed with the patient who agrees to proceed.  -Continue Pantoprazole in am until EGD done and shows healing of duodenal ulcer. Continue famotidine at bedtime ( he was taking that prior to hospitalization) -Schedule for a screening colonoscopy. The risks and benefits of colonoscopy with possible polypectomy / biopsies were discussed and the patient agrees to proceed.  - Will schedule procedures to be done at the [facility] but given his severe heart disease I will make sure our Anesthesia service are comfortable with this. Otherwise will reschedule procedures to be done at the hospital  -Hold Eliquis for 2 days before procedure - will instruct when and how to resume after procedure. Patient understands that there is a low but real risk of cardiovascular event such as heart attack, stroke, or embolism /  thrombosis while off blood thinner. The patient consents to proceed. Will communicate by phone or EMR with patient's prescribing provider to confirm that holding Eliquis is reasonable in this case.    History of Present Illness   Chief complaint:  Hospital follow up   [name] is a 77 y.o. male known to Dr. Candis Schatz with a past medical history of HTN, HLD, Afib on warfarin, moderate MR, severe LAE, pulmonary HTN , DM, CKD, lower extremity cellulitis / chronic  wounds . See PMH / [place] for additional details.   Patent was admitted mid Feb with bilateral LE cellulitis, symptomatic anemia / melena on warfarin and also AKI on CKD. Hgb in mid 6 range. Received 4-5 units of blood. Inpatient EGD >> non-bleeding duodenal ulcer. Duodoenal bx negative for H.pylori. He seldom took NSAIDs. Since discharged he has been taking both Pantoprazole and famotidine No further melena. Cardiologist started him on iron several days ago. So far, no constipation.      Labs:     Latest Ref Rng & Units 05/03/2022    7:55 AM 08/27/2016    6:51 PM  Hepatic Function  Total Protein 6.5 - 8.1 g/dL 4.7  7.2   Albumin 3.5 - 5.0 g/dL 1.9  4.2   AST 15 - 41 U/L 20  16   ALT 0 - 44 U/L 21  14   Alk Phosphatase 38 - 126 U/L 52  49   Total Bilirubin 0.3 - 1.2 mg/dL 0.2  0.6        Latest Ref Rng & Units 05/18/2022   11:32 AM 05/09/2022    4:01 AM 05/08/2022    7:09 AM  CBC  WBC 3.4 - 10.8 x10E3/uL 9.8  13.1  11.3   Hemoglobin 13.0 - 17.7 g/dL 9.1  8.4  8.0   Hematocrit 37.5 - 51.0 % 29.1  25.5  24.0   Platelets 150 - 450 x10E3/uL 270  187  162    Echo Feb  2024 FINDINGS   Left Ventricle: Left ventricular ejection fraction, by estimation, is 70  to 75%. The left ventricle has hyperdynamic function. The left ventricle  has no regional wall motion abnormalities. The left ventricular internal  cavity size was normal in size.  There is no left ventricular hypertrophy. Left ventricular diastolic  parameters are indeterminate.   Right Ventricle: The right ventricular size is moderately enlarged. No  increase in right ventricular wall thickness. Right ventricular systolic  function is normal. There is moderately elevated pulmonary artery systolic  pressure. The tricuspid regurgitant   velocity is 3.51 m/s, and with an assumed right atrial pressure of 8  mmHg, the estimated right ventricular systolic pressure is 123456 mmHg.   Left Atrium: Left atrial size was massively dilated.    Right Atrium: Right atrial size was severely dilated.   Pericardium: There is no evidence of pericardial effusion.   Mitral Valve: The mitral valve is normal in structure. Severe mitral valve  regurgitation.   Tricuspid Valve: The tricuspid valve is normal in structure. Tricuspid  valve regurgitation is severe.   Aortic Valve: The aortic valve is tricuspid. Aortic valve regurgitation is  trivial.   Pulmonic Valve: The pulmonic valve was not well visualized. Pulmonic valve  regurgitation is moderate. No evidence of pulmonic stenosis.   Aorta: The aortic root and ascending aorta are structurally normal, with  no evidence of dilitation.   Venous: The inferior vena cava is normal in size with less than 50%  respiratory variability, suggesting right atrial pressure of 8 mmHg.   IAS/Shunts: No atrial level shunt detected by color flow Doppler.     LEFT VENTRICLE  PLAX 2D  LVIDd:         4.20 cm  LVIDs:         2.80 cm  LV PW:         1.00 cm  LV IVS:        0.90 cm  LVOT diam:     2.20 cm  LV SV:         69  LV SV Index:   30  LVOT Area:     3.80 cm     RIGHT VENTRICLE  RV S prime:     18.00 cm/s  TAPSE (M-mode): 2.0 cm   LEFT ATRIUM              Index         RIGHT ATRIUM           Index  LA diam:        7.80 cm  3.46 cm/m    RA Area:     40.40 cm  LA Vol (A2C):   225.0 ml 99.83 ml/m   RA Volume:   143.00 ml 63.45 ml/m  LA Vol (A4C):   268.0 ml 118.90 ml/m  LA Biplane Vol: 252.0 ml 111.81 ml/m   AORTIC VALVE  LVOT Vmax:   134.33 cm/s  LVOT Vmean:  89.900 cm/s  LVOT VTI:    0.181 m    AORTA  Ao Root diam: 3.40 cm  Ao Asc diam:  3.10 cm   TRICUSPID VALVE  TR Peak grad:   49.3 mmHg  TR Vmax:        351.00 cm/s    SHUNTS  Systemic VTI:  0.18 m  Systemic Diam: 2.20 cm   Rudean Haskell MD  Electronically signed by Rudean Haskell MD  Signature Date/Time: 05/05/2022/4:35:14 PM    Previous GI  Evaluation   Reportedly normal colonoscopy at  [facility] in 2012  05/04/22 EGD -Normal esophagus. - A few gastric polyps. - Normal stomach. Biopsied for H pylori. - Non-bleeding duodenal ulcer. - The examination was otherwise normal. -FINAL MICROSCOPIC DIAGNOSIS:  A. STOMACH, BIOPSY:  - Gastric antral and oxyntic mucosa with mild nonspecific reactive  gastropathy  - Helicobacter pylori-like organisms are not identified on routine HE  stain    Past Medical History:  Diagnosis Date   Atrial fibrillation (HCC)    Chronic anticoagulation    Complication of anesthesia    ' hard time waking up" per patient    Diabetes mellitus    type 2   GERD (gastroesophageal reflux disease)    Hyperlipidemia    Hypertension    Mitral regurgitation    a. 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HCC)    chronic atrial fib   Pulmonary hypertension ([place])    Pulmonary hypertension ([place])     Past Surgical History:  Procedure Laterality Date   BIOPSY  05/04/2022   Procedure: BIOPSY;  Surgeon: Thornton Park, MD;  Location: [facility];  Service: Gastroenterology;;   CARDIOVERSION     CHOLECYSTECTOMY N/A 03/01/2015   Procedure: LAPAROSCOPIC CHOLECYSTECTOMY;  Surgeon: Ralene Ok, MD;  Location: [facility];  Service: General;  Laterality: N/A;   ESOPHAGOGASTRODUODENOSCOPY (EGD) WITH PROPOFOL N/A 05/04/2022   Procedure: ESOPHAGOGASTRODUODENOSCOPY (EGD) WITH PROPOFOL;  Surgeon: Thornton Park, MD;  Location: [facility];  Service: Gastroenterology;  Laterality: N/A;   spur on heel      Current Medications, Allergies, Family History and Social History were reviewed in [employer] record.     Current Outpatient Medications  Medication Sig Dispense Refill   acetaminophen (TYLENOL) 500 MG tablet Take 1,000 mg by mouth as needed for mild pain.     allopurinol (ZYLOPRIM) 100 MG tablet Take 1 tablet (100 mg total) by mouth 2 (two) times daily. 120 tablet 0    apixaban (ELIQUIS) 5 MG TABS tablet Take 1 tablet (5 mg total) by mouth 2 (two) times daily. 60 tablet 0   famotidine (PEPCID) 20 MG tablet TAKE 1 TABLET BY MOUTH DAILY - KEEP APPOINTMENT FOR FURTHER REFILLS (Patient taking differently: Take 20 mg by mouth daily.) 90 tablet 3   feeding supplement (ENSURE ENLIVE / ENSURE PLUS) LIQD Take 237 mLs by mouth 2 (two) times daily between meals. 237 mL 12   fludrocortisone (FLORINEF) 0.1 MG tablet Take 1 tablet (0.1 mg total) by mouth daily. 30 tablet 3   glipiZIDE (GLUCOTROL XL) 2.5 MG 24 hr tablet TAKE 1 TABLET BY MOUTH DAILY WITH BREAKFAST 90 tablet 1   Glucose Blood (FREESTYLE LITE TEST VI)      Iron, Ferrous Sulfate, 325 (65 Fe) MG TABS Take 325 mg by mouth daily. 30 tablet 2   lidocaine (LIDODERM) 5 % Place 1 patch onto the skin as needed. Remove & Discard patch within 12 hours or as directed by MD     metoprolol tartrate (LOPRESSOR) 25 MG tablet Take 0.5 tablets (12.5 mg total) by mouth 2 (two) times daily. 30 tablet 0   midodrine (PROAMATINE) 10 MG tablet Take 1 tablet (10 mg total) by mouth 3 (three) times daily with meals. 90 tablet 3   Multiple Vitamin (MULTIVITAMIN WITH MINERALS) TABS tablet Take 1 tablet by mouth daily. 30 tablet 0   pantoprazole (PROTONIX) 40 MG tablet Take 1 tablet (  40 mg total) by mouth daily. 30 tablet 0   predniSONE (DELTASONE) 10 MG tablet Take 0.5 tablets (5 mg total) by mouth daily with breakfast. 15 tablet 1   silver sulfADIAZINE (SILVADENE) 1 % cream Apply topically.     simvastatin (ZOCOR) 20 MG tablet Take 1 tablet (20 mg total) by mouth at bedtime. 90 tablet 0   tamsulosin (FLOMAX) 0.4 MG CAPS capsule Take 0.4 mg by mouth daily.     No current facility-administered medications for this visit.    Review of Systems: No chest pain. No shortness of breath. No urinary complaints.    Physical Exam  Wt Readings from Last 3 Encounters:  05/27/22 213 lb 9.6 oz (96.9 kg)  05/18/22 203 lb 3.2 oz (92.2 kg)  05/03/22  213 lb (96.6 kg)    BP 126/78   Pulse 87   Ht 6\' 3"  (1.905 m)   Wt 223 lb (101.2 kg)   BMI 27.87 kg/m  Constitutional:  Pleasant, generally well appearing male in no acute distress. Psychiatric: Normal mood and affect. Behavior is normal. EENT: Pupils normal.  Conjunctivae are normal. No scleral icterus. Neck supple.  Cardiovascular: Normal rate, regular rhythm.  Pulmonary/chest: Effort normal and breath sounds normal. No wheezing, rales or rhonchi. Abdominal: Soft, nondistended, nontender. Bowel sounds active throughout. There are no masses palpable. No hepatomegaly. Neurological: Alert and oriented to person place and time.  Skin: Skin is warm and dry. No rashes noted.  Tye Savoy, NP  06/08/2022, 8:24 AM  Cc:  de [place], Raymond J, MD

## 2022-06-08 NOTE — Telephone Encounter (Signed)
Chart reviewed as part of preoperative cardiac evaluation.  Dr. [place], patient recently seen by you on 05/27/2022 now pending request to undergo colonoscopy and EGD on 06/30/2022.  Would you please comment on medical clearance and route your response to p cv div preop?  Thank you, Sharyn Lull

## 2022-06-09 ENCOUNTER — Telehealth (HOSPITAL_BASED_OUTPATIENT_CLINIC_OR_DEPARTMENT_OTHER): Payer: Self-pay

## 2022-06-09 NOTE — Telephone Encounter (Signed)
Pt called and asked if the metoprolol 25 mg tablet can be refilled.  Thanks

## 2022-06-10 DIAGNOSIS — D62 Acute posthemorrhagic anemia: Secondary | ICD-10-CM | POA: Diagnosis not present

## 2022-06-10 DIAGNOSIS — N179 Acute kidney failure, unspecified: Secondary | ICD-10-CM | POA: Diagnosis not present

## 2022-06-10 DIAGNOSIS — I129 Hypertensive chronic kidney disease with stage 1 through stage 4 chronic kidney disease, or unspecified chronic kidney disease: Secondary | ICD-10-CM | POA: Diagnosis not present

## 2022-06-10 DIAGNOSIS — K317 Polyp of stomach and duodenum: Secondary | ICD-10-CM | POA: Diagnosis not present

## 2022-06-10 DIAGNOSIS — M310 Hypersensitivity angiitis: Secondary | ICD-10-CM | POA: Diagnosis not present

## 2022-06-10 DIAGNOSIS — K269 Duodenal ulcer, unspecified as acute or chronic, without hemorrhage or perforation: Secondary | ICD-10-CM | POA: Diagnosis not present

## 2022-06-10 NOTE — Telephone Encounter (Signed)
Called patient and LM to call back to discuss holding Eliquis for Procedure on 06-30-22.

## 2022-06-10 NOTE — Telephone Encounter (Signed)
Patient's wife called back.  She expressed understanding that he needs to hold Eliquis on 4-14 and 4-15. She indicated that she hasn't heard back from provider re: whether he is OK to proceed at [facility] or will need to be done at Hospital.  Patient requested a call back for confirmation once this has been determined.

## 2022-06-11 DIAGNOSIS — D62 Acute posthemorrhagic anemia: Secondary | ICD-10-CM | POA: Diagnosis not present

## 2022-06-11 DIAGNOSIS — E1151 Type 2 diabetes mellitus with diabetic peripheral angiopathy without gangrene: Secondary | ICD-10-CM | POA: Diagnosis not present

## 2022-06-11 DIAGNOSIS — K269 Duodenal ulcer, unspecified as acute or chronic, without hemorrhage or perforation: Secondary | ICD-10-CM | POA: Diagnosis not present

## 2022-06-11 DIAGNOSIS — I872 Venous insufficiency (chronic) (peripheral): Secondary | ICD-10-CM | POA: Diagnosis not present

## 2022-06-11 DIAGNOSIS — N400 Enlarged prostate without lower urinary tract symptoms: Secondary | ICD-10-CM | POA: Diagnosis not present

## 2022-06-11 DIAGNOSIS — M109 Gout, unspecified: Secondary | ICD-10-CM | POA: Diagnosis not present

## 2022-06-11 DIAGNOSIS — E119 Type 2 diabetes mellitus without complications: Secondary | ICD-10-CM | POA: Diagnosis not present

## 2022-06-11 DIAGNOSIS — F419 Anxiety disorder, unspecified: Secondary | ICD-10-CM | POA: Diagnosis not present

## 2022-06-11 DIAGNOSIS — Z8619 Personal history of other infectious and parasitic diseases: Secondary | ICD-10-CM | POA: Diagnosis not present

## 2022-06-11 DIAGNOSIS — N1832 Chronic kidney disease, stage 3b: Secondary | ICD-10-CM | POA: Diagnosis not present

## 2022-06-11 DIAGNOSIS — K317 Polyp of stomach and duodenum: Secondary | ICD-10-CM | POA: Diagnosis not present

## 2022-06-11 DIAGNOSIS — Z85828 Personal history of other malignant neoplasm of skin: Secondary | ICD-10-CM | POA: Diagnosis not present

## 2022-06-11 DIAGNOSIS — Z7984 Long term (current) use of oral hypoglycemic drugs: Secondary | ICD-10-CM | POA: Diagnosis not present

## 2022-06-11 DIAGNOSIS — I129 Hypertensive chronic kidney disease with stage 1 through stage 4 chronic kidney disease, or unspecified chronic kidney disease: Secondary | ICD-10-CM | POA: Diagnosis not present

## 2022-06-11 DIAGNOSIS — R410 Disorientation, unspecified: Secondary | ICD-10-CM | POA: Diagnosis not present

## 2022-06-11 DIAGNOSIS — K5900 Constipation, unspecified: Secondary | ICD-10-CM | POA: Diagnosis not present

## 2022-06-11 DIAGNOSIS — I951 Orthostatic hypotension: Secondary | ICD-10-CM | POA: Diagnosis not present

## 2022-06-11 DIAGNOSIS — N179 Acute kidney failure, unspecified: Secondary | ICD-10-CM | POA: Diagnosis not present

## 2022-06-11 DIAGNOSIS — E875 Hyperkalemia: Secondary | ICD-10-CM | POA: Diagnosis not present

## 2022-06-11 DIAGNOSIS — I4819 Other persistent atrial fibrillation: Secondary | ICD-10-CM | POA: Diagnosis not present

## 2022-06-11 DIAGNOSIS — Z85820 Personal history of malignant melanoma of skin: Secondary | ICD-10-CM | POA: Diagnosis not present

## 2022-06-11 DIAGNOSIS — R634 Abnormal weight loss: Secondary | ICD-10-CM | POA: Diagnosis not present

## 2022-06-11 DIAGNOSIS — Z7901 Long term (current) use of anticoagulants: Secondary | ICD-10-CM | POA: Diagnosis not present

## 2022-06-11 DIAGNOSIS — E785 Hyperlipidemia, unspecified: Secondary | ICD-10-CM | POA: Diagnosis not present

## 2022-06-11 DIAGNOSIS — M310 Hypersensitivity angiitis: Secondary | ICD-10-CM | POA: Diagnosis not present

## 2022-06-11 NOTE — Telephone Encounter (Signed)
Per Nevin Bloodgood - OK to proceed at [facility]. She left a message for patient.

## 2022-06-12 ENCOUNTER — Other Ambulatory Visit (HOSPITAL_BASED_OUTPATIENT_CLINIC_OR_DEPARTMENT_OTHER): Payer: Self-pay | Admitting: Family Medicine

## 2022-06-12 DIAGNOSIS — N179 Acute kidney failure, unspecified: Secondary | ICD-10-CM | POA: Diagnosis not present

## 2022-06-12 DIAGNOSIS — E785 Hyperlipidemia, unspecified: Secondary | ICD-10-CM

## 2022-06-12 DIAGNOSIS — D62 Acute posthemorrhagic anemia: Secondary | ICD-10-CM | POA: Diagnosis not present

## 2022-06-12 DIAGNOSIS — K269 Duodenal ulcer, unspecified as acute or chronic, without hemorrhage or perforation: Secondary | ICD-10-CM | POA: Diagnosis not present

## 2022-06-12 DIAGNOSIS — I129 Hypertensive chronic kidney disease with stage 1 through stage 4 chronic kidney disease, or unspecified chronic kidney disease: Secondary | ICD-10-CM | POA: Diagnosis not present

## 2022-06-12 DIAGNOSIS — K317 Polyp of stomach and duodenum: Secondary | ICD-10-CM | POA: Diagnosis not present

## 2022-06-12 DIAGNOSIS — M310 Hypersensitivity angiitis: Secondary | ICD-10-CM | POA: Diagnosis not present

## 2022-06-15 DIAGNOSIS — Z79899 Other long term (current) drug therapy: Secondary | ICD-10-CM | POA: Diagnosis not present

## 2022-06-15 DIAGNOSIS — L97522 Non-pressure chronic ulcer of other part of left foot with fat layer exposed: Secondary | ICD-10-CM | POA: Diagnosis not present

## 2022-06-15 DIAGNOSIS — E11621 Type 2 diabetes mellitus with foot ulcer: Secondary | ICD-10-CM | POA: Diagnosis not present

## 2022-06-15 DIAGNOSIS — L97812 Non-pressure chronic ulcer of other part of right lower leg with fat layer exposed: Secondary | ICD-10-CM | POA: Diagnosis not present

## 2022-06-15 DIAGNOSIS — L97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: Secondary | ICD-10-CM | POA: Diagnosis not present

## 2022-06-15 DIAGNOSIS — E11622 Type 2 diabetes mellitus with other skin ulcer: Secondary | ICD-10-CM | POA: Diagnosis not present

## 2022-06-15 DIAGNOSIS — L97512 Non-pressure chronic ulcer of other part of right foot with fat layer exposed: Secondary | ICD-10-CM | POA: Diagnosis not present

## 2022-06-15 DIAGNOSIS — I872 Venous insufficiency (chronic) (peripheral): Secondary | ICD-10-CM | POA: Diagnosis not present

## 2022-06-15 DIAGNOSIS — L97822 Non-pressure chronic ulcer of other part of left lower leg with fat layer exposed: Secondary | ICD-10-CM | POA: Diagnosis not present

## 2022-06-15 DIAGNOSIS — L97222 Non-pressure chronic ulcer of left calf with fat layer exposed: Secondary | ICD-10-CM | POA: Diagnosis not present

## 2022-06-15 NOTE — Progress Notes (Signed)
Agree with the assessment and plan as outlined by Paula Guenther, NP.  Mckayla Mulcahey E. Bradyn Vassey, MD [facility]  

## 2022-06-16 DIAGNOSIS — K269 Duodenal ulcer, unspecified as acute or chronic, without hemorrhage or perforation: Secondary | ICD-10-CM | POA: Diagnosis not present

## 2022-06-16 DIAGNOSIS — N179 Acute kidney failure, unspecified: Secondary | ICD-10-CM | POA: Diagnosis not present

## 2022-06-16 DIAGNOSIS — M310 Hypersensitivity angiitis: Secondary | ICD-10-CM | POA: Diagnosis not present

## 2022-06-16 DIAGNOSIS — D62 Acute posthemorrhagic anemia: Secondary | ICD-10-CM | POA: Diagnosis not present

## 2022-06-16 DIAGNOSIS — I129 Hypertensive chronic kidney disease with stage 1 through stage 4 chronic kidney disease, or unspecified chronic kidney disease: Secondary | ICD-10-CM | POA: Diagnosis not present

## 2022-06-16 DIAGNOSIS — K317 Polyp of stomach and duodenum: Secondary | ICD-10-CM | POA: Diagnosis not present

## 2022-06-17 ENCOUNTER — Telehealth (HOSPITAL_BASED_OUTPATIENT_CLINIC_OR_DEPARTMENT_OTHER): Payer: Self-pay | Admitting: Family Medicine

## 2022-06-17 DIAGNOSIS — N179 Acute kidney failure, unspecified: Secondary | ICD-10-CM | POA: Diagnosis not present

## 2022-06-17 DIAGNOSIS — I129 Hypertensive chronic kidney disease with stage 1 through stage 4 chronic kidney disease, or unspecified chronic kidney disease: Secondary | ICD-10-CM | POA: Diagnosis not present

## 2022-06-17 DIAGNOSIS — D62 Acute posthemorrhagic anemia: Secondary | ICD-10-CM | POA: Diagnosis not present

## 2022-06-17 DIAGNOSIS — K269 Duodenal ulcer, unspecified as acute or chronic, without hemorrhage or perforation: Secondary | ICD-10-CM | POA: Diagnosis not present

## 2022-06-17 DIAGNOSIS — M86171 Other acute osteomyelitis, right ankle and foot: Secondary | ICD-10-CM | POA: Diagnosis not present

## 2022-06-17 DIAGNOSIS — M310 Hypersensitivity angiitis: Secondary | ICD-10-CM | POA: Diagnosis not present

## 2022-06-17 DIAGNOSIS — K317 Polyp of stomach and duodenum: Secondary | ICD-10-CM | POA: Diagnosis not present

## 2022-06-17 DIAGNOSIS — L03031 Cellulitis of right toe: Secondary | ICD-10-CM | POA: Diagnosis not present

## 2022-06-17 DIAGNOSIS — E11621 Type 2 diabetes mellitus with foot ulcer: Secondary | ICD-10-CM | POA: Diagnosis not present

## 2022-06-17 NOTE — Telephone Encounter (Signed)
Called patient to schedule Medicare Annual Wellness Visit (AWV). Left message for patient to call back and schedule Medicare Annual Wellness Visit (AWV).  Last date of AWV: AWVI eligible as of 09/13/2016   Please schedule an AWVI appointment at any time with [place] .  If any questions, please contact me at [phone].    Thank you,  [place] Direct dial  [phone]

## 2022-06-18 ENCOUNTER — Other Ambulatory Visit: Payer: Self-pay

## 2022-06-18 ENCOUNTER — Telehealth: Payer: Self-pay | Admitting: Nurse Practitioner

## 2022-06-18 ENCOUNTER — Other Ambulatory Visit (INDEPENDENT_AMBULATORY_CARE_PROVIDER_SITE_OTHER): Payer: Medicare Other

## 2022-06-18 DIAGNOSIS — D649 Anemia, unspecified: Secondary | ICD-10-CM

## 2022-06-18 LAB — CBC WITH DIFFERENTIAL/PLATELET
Basophils Absolute: 0.1 10*3/uL (ref 0.0–0.1)
Basophils Relative: 1 % (ref 0.0–3.0)
Eosinophils Absolute: 0.1 10*3/uL (ref 0.0–0.7)
Eosinophils Relative: 1.1 % (ref 0.0–5.0)
HCT: 30.1 % — ABNORMAL LOW (ref 39.0–52.0)
Hemoglobin: 9.5 g/dL — ABNORMAL LOW (ref 13.0–17.0)
Lymphocytes Relative: 15.1 % (ref 12.0–46.0)
Lymphs Abs: 1.1 10*3/uL (ref 0.7–4.0)
MCHC: 31.4 g/dL (ref 30.0–36.0)
MCV: 94.6 fl (ref 78.0–100.0)
Monocytes Absolute: 0.7 10*3/uL (ref 0.1–1.0)
Monocytes Relative: 8.9 % (ref 3.0–12.0)
Neutro Abs: 5.5 10*3/uL (ref 1.4–7.7)
Neutrophils Relative %: 73.9 % (ref 43.0–77.0)
Platelets: 203 10*3/uL (ref 150.0–400.0)
RBC: 3.18 Mil/uL — ABNORMAL LOW (ref 4.22–5.81)
RDW: 19.9 % — ABNORMAL HIGH (ref 11.5–15.5)
WBC: 7.4 10*3/uL (ref 4.0–10.5)

## 2022-06-18 NOTE — Telephone Encounter (Signed)
Spoke with the spouse. The patient has had another bowel movement. He told the wife that the color is returning to the "normal brown" that he is accustom to seeing.  Advised of the recommendations. Patient will come in for labs. Explained if the patient is significantly weak or is noted to be SOB, he should present to the ER. She states understanding.

## 2022-06-18 NOTE — Telephone Encounter (Signed)
PT wife is calling because he has had 2 dark stools and wants to know if she should bring him in. He has a colonoscopy/EGD scheduled for 4/16 with Dr. Candis Schatz. Please advise.

## 2022-06-18 NOTE — Telephone Encounter (Signed)
Spoke with the patient and the spouse on the speaker phone. Patient reports 3 formed dark bowel movements since yesterday. New mid-left to his side abdominal pain. He noted the pain this morning. He does feel weak. He requires assistance to stand up. He is taking a multi-vitamin and an iron supplement.

## 2022-06-19 DIAGNOSIS — I129 Hypertensive chronic kidney disease with stage 1 through stage 4 chronic kidney disease, or unspecified chronic kidney disease: Secondary | ICD-10-CM | POA: Diagnosis not present

## 2022-06-19 DIAGNOSIS — K269 Duodenal ulcer, unspecified as acute or chronic, without hemorrhage or perforation: Secondary | ICD-10-CM | POA: Diagnosis not present

## 2022-06-19 DIAGNOSIS — M310 Hypersensitivity angiitis: Secondary | ICD-10-CM | POA: Diagnosis not present

## 2022-06-19 DIAGNOSIS — K317 Polyp of stomach and duodenum: Secondary | ICD-10-CM | POA: Diagnosis not present

## 2022-06-19 DIAGNOSIS — N179 Acute kidney failure, unspecified: Secondary | ICD-10-CM | POA: Diagnosis not present

## 2022-06-19 DIAGNOSIS — D62 Acute posthemorrhagic anemia: Secondary | ICD-10-CM | POA: Diagnosis not present

## 2022-06-19 NOTE — Telephone Encounter (Signed)
Spoke with Willette Cluster, NP. Patient's hgb 9.5. Last CBC 1 month ago hgb 9.1. Patient seeing black stool today. Reports he does not feel as good as he has felt. Advised per Gunnar Fusi, hgb is stable. Stools are probably dark due to the daily iron supplement. There are other possible causes to his symptoms and he should present to the ER if he is feeling weak or breathless. Spouse expresses understanding.

## 2022-06-19 NOTE — Telephone Encounter (Signed)
PT wife is calling to discuss the lab results. Please advise.

## 2022-06-22 ENCOUNTER — Telehealth: Payer: Self-pay | Admitting: Family Medicine

## 2022-06-22 ENCOUNTER — Other Ambulatory Visit (HOSPITAL_BASED_OUTPATIENT_CLINIC_OR_DEPARTMENT_OTHER): Payer: Self-pay

## 2022-06-22 DIAGNOSIS — E785 Hyperlipidemia, unspecified: Secondary | ICD-10-CM

## 2022-06-22 DIAGNOSIS — E11622 Type 2 diabetes mellitus with other skin ulcer: Secondary | ICD-10-CM | POA: Diagnosis not present

## 2022-06-22 DIAGNOSIS — M86171 Other acute osteomyelitis, right ankle and foot: Secondary | ICD-10-CM | POA: Diagnosis not present

## 2022-06-22 DIAGNOSIS — E11621 Type 2 diabetes mellitus with foot ulcer: Secondary | ICD-10-CM | POA: Diagnosis not present

## 2022-06-22 DIAGNOSIS — L97512 Non-pressure chronic ulcer of other part of right foot with fat layer exposed: Secondary | ICD-10-CM | POA: Diagnosis not present

## 2022-06-22 DIAGNOSIS — L97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: Secondary | ICD-10-CM | POA: Diagnosis not present

## 2022-06-22 DIAGNOSIS — E1169 Type 2 diabetes mellitus with other specified complication: Secondary | ICD-10-CM | POA: Diagnosis not present

## 2022-06-22 DIAGNOSIS — I872 Venous insufficiency (chronic) (peripheral): Secondary | ICD-10-CM | POA: Diagnosis not present

## 2022-06-22 DIAGNOSIS — L97222 Non-pressure chronic ulcer of left calf with fat layer exposed: Secondary | ICD-10-CM | POA: Diagnosis not present

## 2022-06-22 DIAGNOSIS — L97822 Non-pressure chronic ulcer of other part of left lower leg with fat layer exposed: Secondary | ICD-10-CM | POA: Diagnosis not present

## 2022-06-22 MED ORDER — SIMVASTATIN 20 MG PO TABS: 20.0000 mg | ORAL_TABLET | Freq: Every day | ORAL | 0 refills | Status: DC

## 2022-06-22 NOTE — Telephone Encounter (Signed)
Contacted [name] to schedule their annual wellness visit. Appointment made for 07/07/2022.  Thank you,  [facility] Support [facility] Direct dial  [phone]

## 2022-06-23 ENCOUNTER — Telehealth: Payer: Self-pay | Admitting: Cardiology

## 2022-06-23 NOTE — Telephone Encounter (Signed)
Pt c/o medication issue:  1. Name of Medication: Cialis   2. How are you currently taking this medication (dosage and times per day)?   3. Are you having a reaction (difficulty breathing--STAT)?   4. What is your medication issue? Patient would like a call back to discuss getting this medication filled with Dr. Sw[place]. Please advise.

## 2022-06-23 NOTE — Telephone Encounter (Signed)
Patient ask if Cialis can be prescribed by Dr Sw[place] He uses 10mg  once a day, no more than 2 more in a 24 hour period.  He states the PCP is not in and the NP did not feel comfortable prescribing it without PCP or cardiology.  He ask if he can get this through our office.

## 2022-06-24 ENCOUNTER — Ambulatory Visit (HOSPITAL_BASED_OUTPATIENT_CLINIC_OR_DEPARTMENT_OTHER): Payer: Medicare Other | Admitting: Family Medicine

## 2022-06-24 DIAGNOSIS — D62 Acute posthemorrhagic anemia: Secondary | ICD-10-CM | POA: Diagnosis not present

## 2022-06-24 DIAGNOSIS — M310 Hypersensitivity angiitis: Secondary | ICD-10-CM | POA: Diagnosis not present

## 2022-06-24 DIAGNOSIS — N179 Acute kidney failure, unspecified: Secondary | ICD-10-CM | POA: Diagnosis not present

## 2022-06-24 DIAGNOSIS — K317 Polyp of stomach and duodenum: Secondary | ICD-10-CM | POA: Diagnosis not present

## 2022-06-24 DIAGNOSIS — K269 Duodenal ulcer, unspecified as acute or chronic, without hemorrhage or perforation: Secondary | ICD-10-CM | POA: Diagnosis not present

## 2022-06-24 DIAGNOSIS — I129 Hypertensive chronic kidney disease with stage 1 through stage 4 chronic kidney disease, or unspecified chronic kidney disease: Secondary | ICD-10-CM | POA: Diagnosis not present

## 2022-06-24 MED ORDER — TADALAFIL 10 MG PO TABS: 10.0000 mg | ORAL_TABLET | Freq: Every day | ORAL | 2 refills | Status: DC | PRN

## 2022-06-24 NOTE — Telephone Encounter (Signed)
Called pt to let him know Dr. Swaziland is willing to send in the prescription. Prescription sent to pharmacy.

## 2022-06-25 DIAGNOSIS — E11621 Type 2 diabetes mellitus with foot ulcer: Secondary | ICD-10-CM | POA: Diagnosis not present

## 2022-06-25 DIAGNOSIS — L97512 Non-pressure chronic ulcer of other part of right foot with fat layer exposed: Secondary | ICD-10-CM | POA: Diagnosis not present

## 2022-06-25 DIAGNOSIS — B351 Tinea unguium: Secondary | ICD-10-CM | POA: Diagnosis not present

## 2022-06-26 DIAGNOSIS — K269 Duodenal ulcer, unspecified as acute or chronic, without hemorrhage or perforation: Secondary | ICD-10-CM | POA: Diagnosis not present

## 2022-06-26 DIAGNOSIS — M310 Hypersensitivity angiitis: Secondary | ICD-10-CM | POA: Diagnosis not present

## 2022-06-26 DIAGNOSIS — N179 Acute kidney failure, unspecified: Secondary | ICD-10-CM | POA: Diagnosis not present

## 2022-06-26 DIAGNOSIS — I129 Hypertensive chronic kidney disease with stage 1 through stage 4 chronic kidney disease, or unspecified chronic kidney disease: Secondary | ICD-10-CM | POA: Diagnosis not present

## 2022-06-26 DIAGNOSIS — K317 Polyp of stomach and duodenum: Secondary | ICD-10-CM | POA: Diagnosis not present

## 2022-06-26 DIAGNOSIS — D62 Acute posthemorrhagic anemia: Secondary | ICD-10-CM | POA: Diagnosis not present

## 2022-06-29 DIAGNOSIS — I872 Venous insufficiency (chronic) (peripheral): Secondary | ICD-10-CM | POA: Diagnosis not present

## 2022-06-29 DIAGNOSIS — L97512 Non-pressure chronic ulcer of other part of right foot with fat layer exposed: Secondary | ICD-10-CM | POA: Diagnosis not present

## 2022-06-29 DIAGNOSIS — E11621 Type 2 diabetes mellitus with foot ulcer: Secondary | ICD-10-CM | POA: Diagnosis not present

## 2022-06-29 DIAGNOSIS — E11622 Type 2 diabetes mellitus with other skin ulcer: Secondary | ICD-10-CM | POA: Diagnosis not present

## 2022-06-29 DIAGNOSIS — L97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: Secondary | ICD-10-CM | POA: Diagnosis not present

## 2022-06-29 DIAGNOSIS — L97222 Non-pressure chronic ulcer of left calf with fat layer exposed: Secondary | ICD-10-CM | POA: Diagnosis not present

## 2022-06-30 ENCOUNTER — Ambulatory Visit (AMBULATORY_SURGERY_CENTER): Payer: Medicare Other | Admitting: Gastroenterology

## 2022-06-30 ENCOUNTER — Encounter: Payer: Self-pay | Admitting: Gastroenterology

## 2022-06-30 VITALS — BP 113/65 | HR 94 | Temp 97.5°F | Resp 21 | Ht 75.0 in | Wt 223.0 lb

## 2022-06-30 DIAGNOSIS — K269 Duodenal ulcer, unspecified as acute or chronic, without hemorrhage or perforation: Secondary | ICD-10-CM

## 2022-06-30 DIAGNOSIS — D123 Benign neoplasm of transverse colon: Secondary | ICD-10-CM

## 2022-06-30 DIAGNOSIS — K635 Polyp of colon: Secondary | ICD-10-CM | POA: Diagnosis not present

## 2022-06-30 DIAGNOSIS — Z1211 Encounter for screening for malignant neoplasm of colon: Secondary | ICD-10-CM

## 2022-06-30 MED ORDER — SODIUM CHLORIDE 0.9 % IV SOLN: 500.0000 mL | Freq: Once | INTRAVENOUS | Status: DC

## 2022-06-30 NOTE — Progress Notes (Signed)
VS by DT  Pt's states no medical or surgical changes since previsit or office visit.  

## 2022-06-30 NOTE — Op Note (Signed)
[facility] Patient Name: [name] Procedure Date: 06/30/2022 2:14 PM MRN: [id number] Endoscopist: Lorin Picket E. Tomasa Rand , MD, [id number] Age: 77 Referring MD:  Date of Birth: 1946-01-16 Gender: Male Account #: [id number] Procedure:                Upper GI endoscopy Indications:              Follow-up of acute duodenal ulcer with hemorrhage Medicines:                Monitored Anesthesia Care Procedure:                Pre-Anesthesia Assessment:                           - Prior to the procedure, a History and Physical                            was performed, and patient medications and                            allergies were reviewed. The patient's tolerance of                            previous anesthesia was also reviewed. The risks                            and benefits of the procedure and the sedation                            options and risks were discussed with the patient.                            All questions were answered, and informed consent                            was obtained. Prior Anticoagulants: The patient has                            taken Eliquis (apixaban), last dose was 3 days                            prior to procedure. ASA Grade Assessment: III - A                            patient with severe systemic disease. After                            reviewing the risks and benefits, the patient was                            deemed in satisfactory condition to undergo the                            procedure.  After obtaining informed consent, the endoscope was                            passed under direct vision. Throughout the                            procedure, the patient's blood pressure, pulse, and                            oxygen saturations were monitored continuously. The                            Olympus Scope [id number] was introduced through the                            mouth, and advanced to the  third part of duodenum.                            The upper GI endoscopy was accomplished without                            difficulty. The patient tolerated the procedure                            well. Scope In: Scope Out: Findings:                 The examined esophagus was normal.                           Diffuse mildly erythematous mucosa without bleeding                            was found in the gastric antrum.                           The exam of the stomach was otherwise normal.                           The examined duodenum was normal. Complications:            No immediate complications. Estimated Blood Loss:     Estimated blood loss: none. Impression:               - Normal esophagus.                           - Erythematous mucosa in the antrum.                           - Normal examined duodenum. The previously noted                            ulcer has healed.                           - No specimens collected. Recommendation:           -  Patient has a contact number available for                            emergencies. The signs and symptoms of potential                            delayed complications were discussed with the                            patient. Return to normal activities tomorrow.                            Written discharge instructions were provided to the                            patient.                           - Resume previous diet.                           - Continue present medications.                           - Resume Eliquis (apixaban) at prior dose tomorrow.                           - Recommend continuing pantoprazole once daily                            indefinitely given history of bleeding ulcer. Aracelis Ulrey E. Tomasa Rand, MD 06/30/2022 2:47:52 PM This report has been signed electronically.

## 2022-06-30 NOTE — Progress Notes (Signed)
A and O x3. Report to RN. Tolerated MAC anesthesia well.Teeth unchanged after procedure. 

## 2022-06-30 NOTE — Addendum Note (Signed)
Addended by: Inocente Salles on: 06/30/2022 03:52 PM   Modules accepted: Orders

## 2022-06-30 NOTE — Patient Instructions (Signed)
Discharge instructions given. Handouts on polyps and Diverticulosis. Resume Eliquis at prior dose tomorrow. Resume previous medications tomorrow. YOU HAD AN ENDOSCOPIC PROCEDURE TODAY AT THE [facility]:   Refer to the procedure report that was given to you for any specific questions about what was found during the examination.  If the procedure report does not answer your questions, please call your gastroenterologist to clarify.  If you requested that your care partner not be given the details of your procedure findings, then the procedure report has been included in a sealed envelope for you to review at your convenience later.  YOU SHOULD EXPECT: Some feelings of bloating in the abdomen. Passage of more gas than usual.  Walking can help get rid of the air that was put into your GI tract during the procedure and reduce the bloating. If you had a lower endoscopy (such as a colonoscopy or flexible sigmoidoscopy) you may notice spotting of blood in your stool or on the toilet paper. If you underwent a bowel prep for your procedure, you may not have a normal bowel movement for a few days.  Please Note:  You might notice some irritation and congestion in your nose or some drainage.  This is from the oxygen used during your procedure.  There is no need for concern and it should clear up in a day or so.  SYMPTOMS TO REPORT IMMEDIATELY:  Following lower endoscopy (colonoscopy or flexible sigmoidoscopy):  Excessive amounts of blood in the stool  Significant tenderness or worsening of abdominal pains  Swelling of the abdomen that is new, acute  Fever of 100F or higher  Following upper endoscopy (EGD)  Vomiting of blood or coffee ground material  New chest pain or pain under the shoulder blades  Painful or persistently difficult swallowing  New shortness of breath  Fever of 100F or higher  Black, tarry-looking stools  For urgent or emergent issues, a gastroenterologist can be reached  at any hour by calling [phone]. Do not use MyChart messaging for urgent concerns.    DIET:  We do recommend a small meal at first, but then you may proceed to your regular diet.  Drink plenty of fluids but you should avoid alcoholic beverages for 24 hours.  ACTIVITY:  You should plan to take it easy for the rest of today and you should NOT DRIVE or use heavy machinery until tomorrow (because of the sedation medicines used during the test).    FOLLOW UP: Our staff will call the number listed on your records the next business day following your procedure.  We will call around 7:15- 8:00 am to check on you and address any questions or concerns that you may have regarding the information given to you following your procedure. If we do not reach you, we will leave a message.     If any biopsies were taken you will be contacted by phone or by letter within the next 1-3 weeks.  Please call us at [phone] if you have not heard about the biopsies in 3 weeks.    SIGNATURES/CONFIDENTIALITY: You and/or your care partner have signed paperwork which will be entered into your electronic medical record.  These signatures attest to the fact that that the information above on your After Visit Summary has been reviewed and is understood.  Full responsibility of the confidentiality of this discharge information lies with you and/or your care-partner.

## 2022-06-30 NOTE — Progress Notes (Signed)
Called to room to assist during endoscopic procedure.  Patient ID and intended procedure confirmed with present staff. Received instructions for my participation in the procedure from the performing physician.  

## 2022-06-30 NOTE — Op Note (Addendum)
[facility] Patient Name: [name] Procedure Date: 06/30/2022 2:14 PM MRN: [id number] Endoscopist: Lorin Picket E. Tomasa Rand , MD, [id number] Age: 77 Referring MD:  Date of Birth: June 05, 1945 Gender: Male Account #: [id number] Procedure:                Colonoscopy Indications:              Screening for colorectal malignant neoplasm (last                            colonoscopy was more than 10 years ago) Medicines:                Monitored Anesthesia Care Procedure:                Pre-Anesthesia Assessment:                           - Prior to the procedure, a History and Physical                            was performed, and patient medications and                            allergies were reviewed. The patient's tolerance of                            previous anesthesia was also reviewed. The risks                            and benefits of the procedure and the sedation                            options and risks were discussed with the patient.                            All questions were answered, and informed consent                            was obtained. Prior Anticoagulants: The patient has                            taken Eliquis (apixaban), last dose was 3 days                            prior to procedure. ASA Grade Assessment: III - A                            patient with severe systemic disease. After                            reviewing the risks and benefits, the patient was                            deemed in satisfactory condition to undergo the  procedure.                           After obtaining informed consent, the colonoscope                            was passed under direct vision. Throughout the                            procedure, the patient's blood pressure, pulse, and                            oxygen saturations were monitored continuously. The                            CF [id number] #[id number] was introduced  through the anus                            and advanced to the the cecum, identified by                            appendiceal orifice and ileocecal valve. The                            colonoscopy was performed without difficulty. The                            patient tolerated the procedure well. The quality                            of the bowel preparation was adequate. The                            ileocecal valve, appendiceal orifice, and rectum                            were photographed. The bowel preparation used was                            SUPREP via split dose instruction. Scope In: 2:28:22 PM Scope Out: 2:39:47 PM Scope Withdrawal Time: 0 hours 8 minutes 31 seconds  Total Procedure Duration: 0 hours 11 minutes 25 seconds  Findings:                 The perianal and digital rectal examinations were                            normal. Pertinent negatives include normal                            sphincter tone and no palpable rectal lesions.                           A 3 mm polyp was found in the transverse colon. The  polyp was sessile. The polyp was removed with a                            cold snare. Resection and retrieval were complete.                            Estimated blood loss was minimal.                           Multiple medium-mouthed and small-mouthed                            diverticula were found in the sigmoid colon and                            descending colon. There was no evidence of                            diverticular bleeding.                           The exam was otherwise normal throughout the                            examined colon.                           The retroflexed view of the distal rectum and anal                            verge was normal and showed no anal or rectal                            abnormalities. Complications:            No immediate complications. Estimated Blood Loss:      Estimated blood loss was minimal. Impression:               - One 3 mm polyp in the transverse colon, removed                            with a cold snare. Resected and retrieved.                           - Moderate diverticulosis in the sigmoid colon and                            in the descending colon. There was no evidence of                            diverticular bleeding.                           - The distal rectum and anal verge are normal on  retroflexion view.                           - The GI Genius (intelligent endoscopy module),                            computer-aided polyp detection system powered by AI                            was utilized to detect colorectal polyps through                            enhanced visualization during colonoscopy. Recommendation:           - Patient has a contact number available for                            emergencies. The signs and symptoms of potential                            delayed complications were discussed with the                            patient. Return to normal activities tomorrow.                            Written discharge instructions were provided to the                            patient.                           - Resume previous diet.                           - Continue present medications.                           - Resume Eliquis (apixaban) at prior dose tomorrow.                           - Await pathology results.                           - Recommend against any further colon cancer                            screening given patient's age and lack of high risk                            polyps. Lasonia Casino E. Tomasa Rand, MD 06/30/2022 2:53:14 PM This report has been signed electronically.

## 2022-07-01 ENCOUNTER — Telehealth: Payer: Self-pay | Admitting: *Deleted

## 2022-07-01 DIAGNOSIS — I129 Hypertensive chronic kidney disease with stage 1 through stage 4 chronic kidney disease, or unspecified chronic kidney disease: Secondary | ICD-10-CM | POA: Diagnosis not present

## 2022-07-01 DIAGNOSIS — D62 Acute posthemorrhagic anemia: Secondary | ICD-10-CM | POA: Diagnosis not present

## 2022-07-01 DIAGNOSIS — N179 Acute kidney failure, unspecified: Secondary | ICD-10-CM | POA: Diagnosis not present

## 2022-07-01 DIAGNOSIS — K269 Duodenal ulcer, unspecified as acute or chronic, without hemorrhage or perforation: Secondary | ICD-10-CM | POA: Diagnosis not present

## 2022-07-01 DIAGNOSIS — M310 Hypersensitivity angiitis: Secondary | ICD-10-CM | POA: Diagnosis not present

## 2022-07-01 DIAGNOSIS — K317 Polyp of stomach and duodenum: Secondary | ICD-10-CM | POA: Diagnosis not present

## 2022-07-01 NOTE — Telephone Encounter (Signed)
Left message on f/u call 

## 2022-07-02 DIAGNOSIS — I129 Hypertensive chronic kidney disease with stage 1 through stage 4 chronic kidney disease, or unspecified chronic kidney disease: Secondary | ICD-10-CM | POA: Diagnosis not present

## 2022-07-02 DIAGNOSIS — N179 Acute kidney failure, unspecified: Secondary | ICD-10-CM | POA: Diagnosis not present

## 2022-07-02 DIAGNOSIS — K269 Duodenal ulcer, unspecified as acute or chronic, without hemorrhage or perforation: Secondary | ICD-10-CM | POA: Diagnosis not present

## 2022-07-02 DIAGNOSIS — D62 Acute posthemorrhagic anemia: Secondary | ICD-10-CM | POA: Diagnosis not present

## 2022-07-02 DIAGNOSIS — K317 Polyp of stomach and duodenum: Secondary | ICD-10-CM | POA: Diagnosis not present

## 2022-07-02 DIAGNOSIS — M310 Hypersensitivity angiitis: Secondary | ICD-10-CM | POA: Diagnosis not present

## 2022-07-03 DIAGNOSIS — N179 Acute kidney failure, unspecified: Secondary | ICD-10-CM | POA: Diagnosis not present

## 2022-07-03 DIAGNOSIS — K317 Polyp of stomach and duodenum: Secondary | ICD-10-CM | POA: Diagnosis not present

## 2022-07-03 DIAGNOSIS — M310 Hypersensitivity angiitis: Secondary | ICD-10-CM | POA: Diagnosis not present

## 2022-07-03 DIAGNOSIS — D62 Acute posthemorrhagic anemia: Secondary | ICD-10-CM | POA: Diagnosis not present

## 2022-07-03 DIAGNOSIS — I129 Hypertensive chronic kidney disease with stage 1 through stage 4 chronic kidney disease, or unspecified chronic kidney disease: Secondary | ICD-10-CM | POA: Diagnosis not present

## 2022-07-03 DIAGNOSIS — K269 Duodenal ulcer, unspecified as acute or chronic, without hemorrhage or perforation: Secondary | ICD-10-CM | POA: Diagnosis not present

## 2022-07-04 ENCOUNTER — Other Ambulatory Visit (HOSPITAL_BASED_OUTPATIENT_CLINIC_OR_DEPARTMENT_OTHER): Payer: Self-pay | Admitting: Family Medicine

## 2022-07-06 DIAGNOSIS — E11621 Type 2 diabetes mellitus with foot ulcer: Secondary | ICD-10-CM | POA: Diagnosis not present

## 2022-07-06 DIAGNOSIS — I872 Venous insufficiency (chronic) (peripheral): Secondary | ICD-10-CM | POA: Diagnosis not present

## 2022-07-06 DIAGNOSIS — E11622 Type 2 diabetes mellitus with other skin ulcer: Secondary | ICD-10-CM | POA: Diagnosis not present

## 2022-07-06 DIAGNOSIS — L97512 Non-pressure chronic ulcer of other part of right foot with fat layer exposed: Secondary | ICD-10-CM | POA: Diagnosis not present

## 2022-07-06 DIAGNOSIS — L97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: Secondary | ICD-10-CM | POA: Diagnosis not present

## 2022-07-06 DIAGNOSIS — L97222 Non-pressure chronic ulcer of left calf with fat layer exposed: Secondary | ICD-10-CM | POA: Diagnosis not present

## 2022-07-06 DIAGNOSIS — L98492 Non-pressure chronic ulcer of skin of other sites with fat layer exposed: Secondary | ICD-10-CM | POA: Diagnosis not present

## 2022-07-07 ENCOUNTER — Telehealth: Payer: Self-pay | Admitting: Cardiology

## 2022-07-07 ENCOUNTER — Ambulatory Visit (HOSPITAL_BASED_OUTPATIENT_CLINIC_OR_DEPARTMENT_OTHER): Payer: Medicare Other

## 2022-07-07 NOTE — Telephone Encounter (Signed)
Spoke with Josh and he stated patient has BLE edema and think patient needs to be seen as soon as possible.   I tried calling patient left voicemail for patient to return call to office

## 2022-07-07 NOTE — Telephone Encounter (Signed)
Spoke with patient's wife and patient has had BLE edmea since last week. He denies any chest pain or discomfort, shortness of breath, headache or dizziness. Wife states he has gained weight since he was in the hospital but not sure how much.  Patient is on  of lasix. Wife states lasix is prescribed  every other day.  He has not been taking lasix as prescribed. He took one last Thursday.    Josh from wound care was concerned about the fluid build up. States patient needs to be seen as soon as possible. I have him scheduled for Thursday 8am at [address].  I did advise wife to give him the  of lasix now and will speak with provider for further advise.  Also advised to take all medications as prescribed.

## 2022-07-07 NOTE — Telephone Encounter (Signed)
Wound Care Physician [facility] is calling to report the pt has significant edema in lower extremities. He is wondering if diuretic regimen should be altered or titrated. He is requesting the patient be called in regards to this and setup for an appt to be evaluated. The contact number left is his personal cell if there are in questions regarding this. Please advise.

## 2022-07-07 NOTE — Telephone Encounter (Signed)
Wife is aware of provider recommendations. Patient have appointment scheduled 4/25 at [address]. She verbalized understanding

## 2022-07-07 NOTE — Telephone Encounter (Signed)
Would try 20 mg lasix daily - try to arrange for appt with APP based on availability.  Dr Rexene Edison

## 2022-07-08 DIAGNOSIS — K317 Polyp of stomach and duodenum: Secondary | ICD-10-CM | POA: Diagnosis not present

## 2022-07-08 DIAGNOSIS — K269 Duodenal ulcer, unspecified as acute or chronic, without hemorrhage or perforation: Secondary | ICD-10-CM | POA: Diagnosis not present

## 2022-07-08 DIAGNOSIS — N179 Acute kidney failure, unspecified: Secondary | ICD-10-CM | POA: Diagnosis not present

## 2022-07-08 DIAGNOSIS — I129 Hypertensive chronic kidney disease with stage 1 through stage 4 chronic kidney disease, or unspecified chronic kidney disease: Secondary | ICD-10-CM | POA: Diagnosis not present

## 2022-07-08 DIAGNOSIS — M310 Hypersensitivity angiitis: Secondary | ICD-10-CM | POA: Diagnosis not present

## 2022-07-08 DIAGNOSIS — D62 Acute posthemorrhagic anemia: Secondary | ICD-10-CM | POA: Diagnosis not present

## 2022-07-08 NOTE — Progress Notes (Unsigned)
Cardiology Office Note:    Date:  07/09/2022   ID:  [name], DOB [birth year], MRN [id number]  PCP:  de [place], Raymond J, MD   [facility] Providers Cardiologist:  Peter Swaziland, MD     Referring MD: de Peru, Buren Kos, MD   Chief Complaint: leg swelling  History of Present Illness:    [name] is a very pleasant 77 y.o. male with a hx of permanent atrial fibrillation, moderate MR, pulmonary HTN, hypertension, hyperlipidemia, chronic leg wounds, and type 2 diabetes.  Reportedly had a fib dating back to the 1970s while living in [place]. Had been on coumadin for stroke prevention for many years. He underwent lap cholecystectomy in 2016. In 2018 he was admitted with hyperkalemia and AKI that responded to hydration.  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 He was treated with IV antibiotics.  He had been given vitamin K and advised to hold his Coumadin with plans to start Eliquis once INR trended down to 2.  He was discharged home on [facility] readmission at [facility] 2/18-2/25/24 with syncope and upper GI bleed. Had previously been on coumadin. He had black stools. He was orthostatic and had AKI, hyperkalemia. GI team consulted. He underwent EGD on 05/04/2022.  EGD revealed polyps of the stomach and a nonbleeding duodenal ulcer.  He was transfused 4 units PRBCs. GI plan to relook in 6 to 8 weeks. Was hydrated with return of renal function to baseline. He was discharged on Eliquis. TTE during admission (study done with high HR and A-fib) showed normal EF, moderate RV enlargement, moderate pulmonary hypertension, severe MR, and severe biatrial enlargement.  He was started on midodrine and Florinef for orthostasis.  At follow-up with PCP on March 4, hemoglobin improved to 9.1 and renal function was back to baseline.  Seen in cardiology clinic by Dr.  Swaziland on 05/27/2022.  Reported he continued to feel weak, getting home PT.  No further syncope.  Undergoing bilateral wraps on lower extremities at wound center.  40 pound weight loss since the beginning of the year with poor appetite and medical illnesses.  Continued on Eliquis since Coumadin was difficult to regulate.  Dr. Swaziland personally reviewed his recent echo which revealed secondary MR due to annular dilation with severe LAE.  LV function is good.  He is not symptomatic. Pulmonary hypertension felt to have been present for at least 20 years. Plan to return in 3 months.  Our office received a call on 07/07/2022 from wound care physician Ave Filter who reported patient had significant edema in lower extremities. Patient reported swelling x 1 week, no shortness of breath, chest discomfort, does have possible weight gain. Is prescribed Lasix 20 mg every other day but does not take consistently, last dose 4/18. Nurse advised pt to take a dose of Lasix 20 mg at time of call and appointment was scheduled.   Today, he is here with his wife for evaluation. Reports he noted swelling up to thighs for the past 3-4 weeks. No shortness of breath, orthopnea, PND. Is active at home, likes to mow grass. Walks around throughout the day, no prolonged periods of sitting. Has not monitored weight on a consistent basis, but thinks it may have increased 3 pounds or more on 1 occasion. Weight has been gradually increasing since significant weight loss during and following hospitalizations in February. Leg swelling  increases through the day with right > left. He was not been taking furosemide on a consistent basis until 2 days ago. Reports significant urine output, urinating 6 or more times during the night. No presyncope or syncope. No chest pain or palpitations. Home BP 130s over 60s-80 mmHg. Has been on Florinef and midodrine for hypotension since hospitalization 04/2022.   Past Medical History:  Diagnosis Date   Atrial  fibrillation    Chronic anticoagulation    Complication of anesthesia    ' hard time waking up" per patient    Diabetes mellitus    type 2   GERD (gastroesophageal reflux disease)    Hyperlipidemia    Hypertension    Mitral regurgitation    a. Echo (09/2011):  EF 55%, mod MR, severe LAE, severe RAE, PASP 57-61, trivial eff   Permanent atrial fibrillation    chronic atrial fib   Pulmonary hypertension    Pulmonary hypertension     Past Surgical History:  Procedure Laterality Date   BIOPSY  05/04/2022   Procedure: BIOPSY;  Surgeon: Tressia Danas, MD;  Location: [facility] ENDOSCOPY;  Service: Gastroenterology;;   CARDIOVERSION     CHOLECYSTECTOMY N/A 03/01/2015   Procedure: LAPAROSCOPIC CHOLECYSTECTOMY;  Surgeon: Axel Filler, MD;  Location: [facility] ORS;  Service: General;  Laterality: N/A;   ESOPHAGOGASTRODUODENOSCOPY (EGD) WITH PROPOFOL N/A 05/04/2022   Procedure: ESOPHAGOGASTRODUODENOSCOPY (EGD) WITH PROPOFOL;  Surgeon: Tressia Danas, MD;  Location: [facility] ENDOSCOPY;  Service: Gastroenterology;  Laterality: N/A;   spur on heel      Current Medications: Current Meds  Medication Sig   acetaminophen (TYLENOL) 500 MG tablet Take 1,000 mg by mouth as needed for mild pain.   allopurinol (ZYLOPRIM) 100 MG tablet Take 1 tablet (100 mg total) by mouth 2 (two) times daily.   apixaban (ELIQUIS) 5 MG TABS tablet Take 1 tablet (5 mg total) by mouth 2 (two) times daily.   ciclopirox (PENLAC) 8 % solution Apply topically.   feeding supplement (ENSURE ENLIVE / ENSURE PLUS) LIQD Take 237 mLs by mouth 2 (two) times daily between meals.   furosemide (LASIX) 20 MG tablet Take by mouth.   glipiZIDE (GLUCOTROL XL) 2.5 MG 24 hr tablet TAKE 1 TABLET BY MOUTH DAILY WITH BREAKFAST   Glucose Blood (FREESTYLE LITE TEST VI)    Iron, Ferrous Sulfate, 325 (65 Fe) MG TABS Take 325 mg by mouth daily.   lidocaine (LIDODERM) 5 % Place 1 patch onto the skin as needed. Remove & Discard patch within 12 hours or as  directed by MD   midodrine (PROAMATINE) 10 MG tablet Take 1 tablet (10 mg total) by mouth 3 (three) times daily with meals.   NASADROPS SALINE ON THE GO 0.9 % SOLN Apply topically.   NON FORMULARY Mix 1 scoop (500mg ) of powder and 15mL of saline; spray onto affected area(s) once daily or as directed. Discard after 4 hours.   pantoprazole (PROTONIX) 40 MG tablet TAKE 1 TABLET BY MOUTH DAILY   silver sulfADIAZINE (SILVADENE) 1 % cream Apply topically.   simvastatin (ZOCOR) 20 MG tablet Take 1 tablet (20 mg total) by mouth at bedtime.   tadalafil (CIALIS) 10 MG tablet Take 1 tablet (10 mg total) by mouth daily as needed.   tamsulosin (FLOMAX) 0.4 MG CAPS capsule Take 0.4 mg by mouth daily.   terbinafine (LAMISIL) 250 MG tablet Take 1 tablet by mouth daily.   Wound Cleansers (VASHE CLEANSING) SOLN Apply topically.   [DISCONTINUED] fludrocortisone (FLORINEF) 0.1 MG tablet Take  1 tablet (0.1 mg total) by mouth daily.     Allergies:   Atorvastatin and Lisinopril   Social History   Socioeconomic History   Marital status: Married    Spouse name: Not on file   Number of children: 2   Years of education: Not on file   Highest education level: Not on file  Occupational History   Occupation: retired Copywriter, advertising  Tobacco Use   Smoking status: Never   Smokeless tobacco: Never  Vaping Use   Vaping Use: Never used  Substance and Sexual Activity   Alcohol use: Not Currently   Drug use: No   Sexual activity: Not on file  Other Topics Concern   Not on file  Social History Narrative   Not on file   Social Determinants of Health   Financial Resource Strain: Not on file  Food Insecurity: No Food Insecurity (05/07/2022)   Hunger Vital Sign    Worried About Running Out of Food in the Last Year: Never true    Ran Out of Food in the Last Year: Never true  Transportation Needs: No Transportation Needs (05/07/2022)   PRAPARE - Administrator, Civil Service (Medical): No    Lack of  Transportation (Non-Medical): No  Physical Activity: Not on file  Stress: Not on file  Social Connections: Not on file     Family History: The patient's family history includes Alzheimer's disease in his mother; Diabetes in his brother and mother; Heart disease in his father; Hyperlipidemia in his brother; Hypertension in his mother. There is no history of Colon cancer, Esophageal cancer, Rectal cancer, or Stomach cancer.  ROS:   Please see the history of present illness.    + bilateral LE edema All other systems reviewed and are negative.  Labs/Other Studies Reviewed:    The following studies were reviewed today:  Echo 05/05/22 1. Left ventricular ejection fraction, by estimation, is 70 to 75%. The  left ventricle has hyperdynamic function. The left ventricle has no  regional wall motion abnormalities. Left ventricular diastolic parameters  are indeterminate.   2. Right ventricular systolic function is normal. The right ventricular  size is moderately enlarged. There is moderately elevated pulmonary artery  systolic pressure. The estimated right ventricular systolic pressure is  57.3 mmHg.   3. Left atrial size was massively dilated.   4. Right atrial size was severely dilated.   5. The mitral valve is normal in structure. Severe mitral valve  regurgitation.   6. Tricuspid valve regurgitation is severe.   7. The aortic valve is tricuspid. Aortic valve regurgitation is trivial.   8. Pulmonic valve regurgitation is moderate.   9. The inferior vena cava is normal in size with <50% respiratory  variability, suggesting right atrial pressure of 8 mmHg.   Comparison(s): LV is more vigorous from prior, imaged at 140 bpm + during  this study.  Echo 05/31/20 1. Left ventricular ejection fraction, by estimation, is 60 to 65%. The  left ventricle has normal function. The left ventricle has no regional  wall motion abnormalities. There is mild left ventricular hypertrophy.  Left  ventricular diastolic function  could not be evaluated.   2. Right ventricular systolic function is normal. The right ventricular  size is normal. There is severely elevated pulmonary artery systolic  pressure. The estimated right ventricular systolic pressure is 66.0 mmHg.   3. Left atrial size was severely dilated.   4. Right atrial size was moderately dilated.   5.  The mitral valve is abnormal. Moderate to severe mitral valve  regurgitation.   6. Eccentric TR jet. The tricuspid valve is abnormal. Tricuspid valve  regurgitation is moderate.   7. The aortic valve is tricuspid. Aortic valve regurgitation is trivial.   8. The inferior vena cava is dilated in size with <50% respiratory  variability, suggesting right atrial pressure of 15 mmHg.   Comparison(s): Changes from prior study are noted. 05/31/19 EF 60-65%.  Moderate-severe MR, RVSP 50 mmHg.   Recent Labs: 05/03/2022: ALT 21 05/04/2022: Magnesium 2.3 05/18/2022: BUN 32; Creatinine, Ser 1.88; Potassium 5.0; Sodium 145 06/18/2022: Hemoglobin 9.5; Platelets 203.0  Recent Lipid Panel No results found for: "CHOL", "TRIG", "HDL", "CHOLHDL", "VLDL", "LDLCALC", "LDLDIRECT"   Risk Assessment/Calculations:    CHA2DS2-VASc Score = 4   This indicates a 4.8% annual risk of stroke. The patient's score is based upon: CHF History: 0 HTN History: 1 Diabetes History: 1 Stroke History: 0 Vascular Disease History: 0 Age Score: 2 Gender Score: 0          Physical Exam:    VS:  BP 136/84 (BP Location: Left Arm, Patient Position: Sitting, Cuff Size: Normal)   Pulse (!) 116   Ht 6\' 3"  (1.905 m)   Wt 226 lb 3.2 oz (102.6 kg)   SpO2 96%   BMI 28.27 kg/m     Wt Readings from Last 3 Encounters:  07/09/22 226 lb 3.2 oz (102.6 kg)  06/30/22 223 lb (101.2 kg)  06/08/22 223 lb (101.2 kg)     GEN: Well nourished, well developed in no acute distress HEENT: Normal NECK: No JVD; No carotid bruits CARDIAC: Irregular RR, no murmurs, rubs,  gallops RESPIRATORY:  Clear to auscultation without rales, wheezing or rhonchi  ABDOMEN: Soft, non-tender, non-distended MUSCULOSKELETAL:  Bilateral LE edema; Dressings on bilateral LE for chronic wounds. Unable to palpate pedal pulses  SKIN: Warm and dry NEUROLOGIC:  Alert and oriented x 3 PSYCHIATRIC:  Normal affect   EKG:  EKG is not ordered today.      Diagnoses:    1. Bilateral lower extremity edema   2. Mitral valve insufficiency, unspecified etiology   3. Permanent atrial fibrillation   4. Orthostatic hypotension   5. Stage 3b chronic kidney disease   6. Chronic anticoagulation   7. Essential hypertension   8. Chronic heart failure with preserved ejection fraction    Assessment and Plan:     Lower extremity edema/HFpEF: Bilateral LE edema that has worsened over the previous 3-4 weeks. Is followed by [facility] for chronic leg wounds which are both bandaged. Swelling up to thighs, improves overnight. No shortness of breath, orthopnea, PND, dyspnea. Echo 05/05/22 with hyperdynamic LVEF 70-75 %, indeterminate diastolic parameters, long history of pulmonary hypertension. He has been on Florinef and midodrine for blood pressure support since February.  This is likely attributing to his edema.  We will go ahead and stop the Florinef and have him monitor BP closely.  Advised him to monitor weight daily and report weight gain of >3 lb in 24 hours or 5 lbs in a week. Needs to improve water intake but limit fluid intake to 2 L dail and follow low sodium diet. Gave him lounge doctor information on appropriate leg elevation.  Continue Lasix 20 mg daily. Checking BMP today to ensure stable renal function on diuretic therapy.   Permanent atrial fibrillation on chronic anticoagulation: History of a fib dating back to 18s. HR is well controlled.  He is asymptomatic.  No bleeding concerns on Eliquis which is appropriately dosed at 5 mg twice daily for stroke prevention for CHA2DS2-VASc score of  4. Will continue low dose BB for rate control.   Mitral regurgitation: Echo 04/2022 reviewed by primary cardiologist which revealed secondary MR due to annular dilation with severe LAE. LV function is good. He is asymptomatic. We will continue to monitor clinically for now.  History of hypertension with orthostatic hypotension: BP is well controlled today. Reports no soft BP readings recently, BP consistently > 120/80. We will go ahead and discontinue Florinef.  Advised him to continue midodrine and continue to monitor BP. Notify us for SBP < 100 mmHg or if he develops symptoms of orthostasis.   CKD Stage 3b: Scr 1.88, GFR 37 on lab work from 05/18/22. We will recheck today. Will continue Lasix 20 mg daily for now.      Disposition: Keep your June appointment with Dr. Swaziland  Medication Adjustments/Labs and Tests Ordered: Current medicines are reviewed at length with the patient today.  Concerns regarding medicines are outlined above.  Orders Placed This Encounter  Procedures   Comp Met (CMET)   Meds ordered this encounter  Medications   metoprolol tartrate (LOPRESSOR) 25 MG tablet    Sig: Take 0.5 tablets (12.5 mg total) by mouth 2 (two) times daily.    Dispense:  90 tablet    Refill:  3    Patient Instructions    Medication Instructions:   DISCONTINUE FLORINEF  You can take one (1) extra Lasix by mouth ( 20 mg) as needed for wt gain of 3 lbs in 24 hours or 5 lbs in one week.     *If you need a refill on your cardiac medications before your next appointment, please call your pharmacy*   Lab Work:  TODAY!!!! CMET   If you have labs (blood work) drawn today and your tests are completely normal, you will receive your results only by: MyChart Message (if you have MyChart) OR A paper copy in the mail If you have any lab test that is abnormal or we need to change your treatment, we will call you to review the results.   Testing/Procedures:  None ordered.   Follow-Up: At  [facility], you and your health needs are our priority.  As part of our continuing mission to provide you with exceptional heart care, we have created designated Provider Care Teams.  These Care Teams include your primary Cardiologist (physician) and Advanced Practice Providers (APPs -  Physician Assistants and Nurse Practitioners) who all work together to provide you with the care you need, when you need it.  We recommend signing up for the patient portal called "MyChart".  Sign up information is provided on this After Visit Summary.  MyChart is used to connect with patients for Virtual Visits (Telemedicine).  Patients are able to view lab/test results, encounter notes, upcoming appointments, etc.  Non-urgent messages can be sent to your provider as well.   To learn more about what you can do with MyChart, go to [email or web address].    Your next appointment:   2 month(s)  Provider:   Peter Swaziland, MD     Other Instructions  HOW TO TAKE YOUR BLOOD PRESSURE  Rest 5 minutes before taking your blood pressure. Don't  smoke or drink caffeinated beverages for at least 30 minutes before. Take your blood pressure before (not after) you eat. Sit comfortably with your back supported and both feet on the floor ( don't  cross your legs). Elevate your arm to heart level on a table or a desk. Use the proper sized cuff.  It should fit smoothly and snugly around your bare upper arm.  There should be  Enough room to slip a fingertip under the cuff.  The bottom edge of the cuff should be 1 inch above the crease Of the elbow. Please monitor your blood pressure once daily 2 hours after your am medication. If you blood pressure Consistently remains below 100  (systolic) top number Consecutively.  Please call our office at [phone] or send Mychart message.       For your  leg edema you  should do  the following 1. Leg elevation - I recommend the Lounge Dr. Leg rest.  See below for details   2. Salt restriction  -  Use potassium chloride instead of regular salt as a salt substitute. 3. Walk regularly 4. Compression hose - Medical Supply store  5. Weight loss    Available on [email or web address] Or  Go to [email or web address]      Recommend weighing daily and keeping a log. Please call our office if you have weight gain of 3 pounds overnight or 5 pounds in 1 week.   Date  Time Weight                                            Please Drink at least 48 fl oz of water daily but do not exceed 64 fl oz.    Signed, Levi Aland, NP  07/09/2022 4:13 PM    [facility]

## 2022-07-09 ENCOUNTER — Ambulatory Visit: Payer: Medicare Other | Attending: Nurse Practitioner | Admitting: Nurse Practitioner

## 2022-07-09 ENCOUNTER — Encounter: Payer: Self-pay | Admitting: Nurse Practitioner

## 2022-07-09 VITALS — BP 136/84 | HR 116 | Ht 75.0 in | Wt 226.2 lb

## 2022-07-09 DIAGNOSIS — I10 Essential (primary) hypertension: Secondary | ICD-10-CM | POA: Diagnosis not present

## 2022-07-09 DIAGNOSIS — I129 Hypertensive chronic kidney disease with stage 1 through stage 4 chronic kidney disease, or unspecified chronic kidney disease: Secondary | ICD-10-CM | POA: Diagnosis not present

## 2022-07-09 DIAGNOSIS — I4821 Permanent atrial fibrillation: Secondary | ICD-10-CM | POA: Diagnosis not present

## 2022-07-09 DIAGNOSIS — I340 Nonrheumatic mitral (valve) insufficiency: Secondary | ICD-10-CM | POA: Insufficient documentation

## 2022-07-09 DIAGNOSIS — N1832 Chronic kidney disease, stage 3b: Secondary | ICD-10-CM | POA: Diagnosis not present

## 2022-07-09 DIAGNOSIS — D62 Acute posthemorrhagic anemia: Secondary | ICD-10-CM | POA: Diagnosis not present

## 2022-07-09 DIAGNOSIS — R600 Localized edema: Secondary | ICD-10-CM | POA: Insufficient documentation

## 2022-07-09 DIAGNOSIS — I5032 Chronic diastolic (congestive) heart failure: Secondary | ICD-10-CM | POA: Diagnosis not present

## 2022-07-09 DIAGNOSIS — Z7901 Long term (current) use of anticoagulants: Secondary | ICD-10-CM | POA: Diagnosis not present

## 2022-07-09 DIAGNOSIS — I482 Chronic atrial fibrillation, unspecified: Secondary | ICD-10-CM | POA: Diagnosis not present

## 2022-07-09 DIAGNOSIS — K269 Duodenal ulcer, unspecified as acute or chronic, without hemorrhage or perforation: Secondary | ICD-10-CM | POA: Diagnosis not present

## 2022-07-09 DIAGNOSIS — M310 Hypersensitivity angiitis: Secondary | ICD-10-CM | POA: Diagnosis not present

## 2022-07-09 DIAGNOSIS — K317 Polyp of stomach and duodenum: Secondary | ICD-10-CM | POA: Diagnosis not present

## 2022-07-09 DIAGNOSIS — I951 Orthostatic hypotension: Secondary | ICD-10-CM | POA: Insufficient documentation

## 2022-07-09 DIAGNOSIS — N179 Acute kidney failure, unspecified: Secondary | ICD-10-CM | POA: Diagnosis not present

## 2022-07-09 MED ORDER — METOPROLOL TARTRATE 25 MG PO TABS: 12.5000 mg | ORAL_TABLET | Freq: Two times a day (BID) | ORAL | 3 refills | Status: DC

## 2022-07-09 NOTE — Patient Instructions (Addendum)
Medication Instructions:   DISCONTINUE FLORINEF  You can take one (1) extra Lasix by mouth ( 20 mg) as needed for wt gain of 3 lbs in 24 hours or 5 lbs in one week.     *If you need a refill on your cardiac medications before your next appointment, please call your pharmacy*   Lab Work:  TODAY!!!! CMET   If you have labs (blood work) drawn today and your tests are completely normal, you will receive your results only by: MyChart Message (if you have MyChart) OR A paper copy in the mail If you have any lab test that is abnormal or we need to change your treatment, we will call you to review the results.   Testing/Procedures:  None ordered.   Follow-Up: At [facility], you and your health needs are our priority.  As part of our continuing mission to provide you with exceptional heart care, we have created designated Provider Care Teams.  These Care Teams include your primary Cardiologist (physician) and Advanced Practice Providers (APPs -  Physician Assistants and Nurse Practitioners) who all work together to provide you with the care you need, when you need it.  We recommend signing up for the patient portal called "MyChart".  Sign up information is provided on this After Visit Summary.  MyChart is used to connect with patients for Virtual Visits (Telemedicine).  Patients are able to view lab/test results, encounter notes, upcoming appointments, etc.  Non-urgent messages can be sent to your provider as well.   To learn more about what you can do with MyChart, go to [email or web address].    Your next appointment:   2 month(s)  Provider:   Peter Swaziland, MD     Other Instructions  HOW TO TAKE YOUR BLOOD PRESSURE  Rest 5 minutes before taking your blood pressure. Don't  smoke or drink caffeinated beverages for at least 30 minutes before. Take your blood pressure before (not after) you eat. Sit comfortably with your back supported and both feet on the floor (  don't cross your legs). Elevate your arm to heart level on a table or a desk. Use the proper sized cuff.  It should fit smoothly and snugly around your bare upper arm.  There should be  Enough room to slip a fingertip under the cuff.  The bottom edge of the cuff should be 1 inch above the crease Of the elbow. Please monitor your blood pressure once daily 2 hours after your am medication. If you blood pressure Consistently remains below 100  (systolic) top number Consecutively.  Please call our office at [phone] or send Mychart message.       For your  leg edema you  should do  the following 1. Leg elevation - I recommend the Lounge Dr. Leg rest.  See below for details  2. Salt restriction  -  Use potassium chloride instead of regular salt as a salt substitute. 3. Walk regularly 4. Compression hose - Medical Supply store  5. Weight loss    Available on [email or web address] Or  Go to [email or web address]      Recommend weighing daily and keeping a log. Please call our office if you have weight gain of 3 pounds overnight or 5 pounds in 1 week.   Date  Time Weight  Please Drink at least 48 fl oz of water daily but do not exceed 64 fl oz.

## 2022-07-10 DIAGNOSIS — I129 Hypertensive chronic kidney disease with stage 1 through stage 4 chronic kidney disease, or unspecified chronic kidney disease: Secondary | ICD-10-CM | POA: Diagnosis not present

## 2022-07-10 DIAGNOSIS — K269 Duodenal ulcer, unspecified as acute or chronic, without hemorrhage or perforation: Secondary | ICD-10-CM | POA: Diagnosis not present

## 2022-07-10 DIAGNOSIS — D62 Acute posthemorrhagic anemia: Secondary | ICD-10-CM | POA: Diagnosis not present

## 2022-07-10 DIAGNOSIS — M310 Hypersensitivity angiitis: Secondary | ICD-10-CM | POA: Diagnosis not present

## 2022-07-10 DIAGNOSIS — N179 Acute kidney failure, unspecified: Secondary | ICD-10-CM | POA: Diagnosis not present

## 2022-07-10 DIAGNOSIS — K317 Polyp of stomach and duodenum: Secondary | ICD-10-CM | POA: Diagnosis not present

## 2022-07-10 LAB — COMPREHENSIVE METABOLIC PANEL
ALT: 12 IU/L (ref 0–44)
AST: 17 IU/L (ref 0–40)
Albumin/Globulin Ratio: 1.3 (ref 1.2–2.2)
Albumin: 3.5 g/dL — ABNORMAL LOW (ref 3.8–4.8)
Alkaline Phosphatase: 77 IU/L (ref 44–121)
BUN/Creatinine Ratio: 11 (ref 10–24)
BUN: 20 mg/dL (ref 8–27)
Bilirubin Total: 1.2 mg/dL (ref 0.0–1.2)
CO2: 25 mmol/L (ref 20–29)
Calcium: 8.8 mg/dL (ref 8.6–10.2)
Chloride: 102 mmol/L (ref 96–106)
Creatinine, Ser: 1.89 mg/dL — ABNORMAL HIGH (ref 0.76–1.27)
Globulin, Total: 2.6 g/dL (ref 1.5–4.5)
Glucose: 101 mg/dL — ABNORMAL HIGH (ref 70–99)
Potassium: 3.7 mmol/L (ref 3.5–5.2)
Sodium: 145 mmol/L — ABNORMAL HIGH (ref 134–144)
Total Protein: 6.1 g/dL (ref 6.0–8.5)
eGFR: 37 mL/min/{1.73_m2} — ABNORMAL LOW (ref >59)

## 2022-07-11 DIAGNOSIS — Z85820 Personal history of malignant melanoma of skin: Secondary | ICD-10-CM | POA: Diagnosis not present

## 2022-07-11 DIAGNOSIS — Z85828 Personal history of other malignant neoplasm of skin: Secondary | ICD-10-CM | POA: Diagnosis not present

## 2022-07-11 DIAGNOSIS — Z7984 Long term (current) use of oral hypoglycemic drugs: Secondary | ICD-10-CM | POA: Diagnosis not present

## 2022-07-11 DIAGNOSIS — I4819 Other persistent atrial fibrillation: Secondary | ICD-10-CM | POA: Diagnosis not present

## 2022-07-11 DIAGNOSIS — I129 Hypertensive chronic kidney disease with stage 1 through stage 4 chronic kidney disease, or unspecified chronic kidney disease: Secondary | ICD-10-CM | POA: Diagnosis not present

## 2022-07-11 DIAGNOSIS — E11621 Type 2 diabetes mellitus with foot ulcer: Secondary | ICD-10-CM | POA: Diagnosis not present

## 2022-07-11 DIAGNOSIS — M310 Hypersensitivity angiitis: Secondary | ICD-10-CM | POA: Diagnosis not present

## 2022-07-11 DIAGNOSIS — L97519 Non-pressure chronic ulcer of other part of right foot with unspecified severity: Secondary | ICD-10-CM | POA: Diagnosis not present

## 2022-07-11 DIAGNOSIS — I872 Venous insufficiency (chronic) (peripheral): Secondary | ICD-10-CM | POA: Diagnosis not present

## 2022-07-11 DIAGNOSIS — Z7901 Long term (current) use of anticoagulants: Secondary | ICD-10-CM | POA: Diagnosis not present

## 2022-07-11 DIAGNOSIS — N400 Enlarged prostate without lower urinary tract symptoms: Secondary | ICD-10-CM | POA: Diagnosis not present

## 2022-07-11 DIAGNOSIS — N1832 Chronic kidney disease, stage 3b: Secondary | ICD-10-CM | POA: Diagnosis not present

## 2022-07-11 DIAGNOSIS — N179 Acute kidney failure, unspecified: Secondary | ICD-10-CM | POA: Diagnosis not present

## 2022-07-11 DIAGNOSIS — K269 Duodenal ulcer, unspecified as acute or chronic, without hemorrhage or perforation: Secondary | ICD-10-CM | POA: Diagnosis not present

## 2022-07-11 DIAGNOSIS — E1122 Type 2 diabetes mellitus with diabetic chronic kidney disease: Secondary | ICD-10-CM | POA: Diagnosis not present

## 2022-07-11 DIAGNOSIS — E785 Hyperlipidemia, unspecified: Secondary | ICD-10-CM | POA: Diagnosis not present

## 2022-07-11 DIAGNOSIS — K317 Polyp of stomach and duodenum: Secondary | ICD-10-CM | POA: Diagnosis not present

## 2022-07-11 DIAGNOSIS — I70202 Unspecified atherosclerosis of native arteries of extremities, left leg: Secondary | ICD-10-CM | POA: Diagnosis not present

## 2022-07-11 DIAGNOSIS — L97421 Non-pressure chronic ulcer of left heel and midfoot limited to breakdown of skin: Secondary | ICD-10-CM | POA: Diagnosis not present

## 2022-07-11 DIAGNOSIS — F419 Anxiety disorder, unspecified: Secondary | ICD-10-CM | POA: Diagnosis not present

## 2022-07-11 DIAGNOSIS — Z9181 History of falling: Secondary | ICD-10-CM | POA: Diagnosis not present

## 2022-07-11 DIAGNOSIS — E1151 Type 2 diabetes mellitus with diabetic peripheral angiopathy without gangrene: Secondary | ICD-10-CM | POA: Diagnosis not present

## 2022-07-11 DIAGNOSIS — M109 Gout, unspecified: Secondary | ICD-10-CM | POA: Diagnosis not present

## 2022-07-13 DIAGNOSIS — E11622 Type 2 diabetes mellitus with other skin ulcer: Secondary | ICD-10-CM | POA: Diagnosis not present

## 2022-07-13 DIAGNOSIS — L97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: Secondary | ICD-10-CM | POA: Diagnosis not present

## 2022-07-13 DIAGNOSIS — I872 Venous insufficiency (chronic) (peripheral): Secondary | ICD-10-CM | POA: Diagnosis not present

## 2022-07-13 DIAGNOSIS — E11621 Type 2 diabetes mellitus with foot ulcer: Secondary | ICD-10-CM | POA: Diagnosis not present

## 2022-07-13 DIAGNOSIS — L97222 Non-pressure chronic ulcer of left calf with fat layer exposed: Secondary | ICD-10-CM | POA: Diagnosis not present

## 2022-07-13 DIAGNOSIS — L97512 Non-pressure chronic ulcer of other part of right foot with fat layer exposed: Secondary | ICD-10-CM | POA: Diagnosis not present

## 2022-07-15 DIAGNOSIS — I70202 Unspecified atherosclerosis of native arteries of extremities, left leg: Secondary | ICD-10-CM | POA: Diagnosis not present

## 2022-07-15 DIAGNOSIS — L97519 Non-pressure chronic ulcer of other part of right foot with unspecified severity: Secondary | ICD-10-CM | POA: Diagnosis not present

## 2022-07-15 DIAGNOSIS — L97421 Non-pressure chronic ulcer of left heel and midfoot limited to breakdown of skin: Secondary | ICD-10-CM | POA: Diagnosis not present

## 2022-07-15 DIAGNOSIS — I872 Venous insufficiency (chronic) (peripheral): Secondary | ICD-10-CM | POA: Diagnosis not present

## 2022-07-15 DIAGNOSIS — E11621 Type 2 diabetes mellitus with foot ulcer: Secondary | ICD-10-CM | POA: Diagnosis not present

## 2022-07-15 DIAGNOSIS — E1151 Type 2 diabetes mellitus with diabetic peripheral angiopathy without gangrene: Secondary | ICD-10-CM | POA: Diagnosis not present

## 2022-07-17 DIAGNOSIS — L97421 Non-pressure chronic ulcer of left heel and midfoot limited to breakdown of skin: Secondary | ICD-10-CM | POA: Diagnosis not present

## 2022-07-17 DIAGNOSIS — I872 Venous insufficiency (chronic) (peripheral): Secondary | ICD-10-CM | POA: Diagnosis not present

## 2022-07-17 DIAGNOSIS — L97519 Non-pressure chronic ulcer of other part of right foot with unspecified severity: Secondary | ICD-10-CM | POA: Diagnosis not present

## 2022-07-17 DIAGNOSIS — I70202 Unspecified atherosclerosis of native arteries of extremities, left leg: Secondary | ICD-10-CM | POA: Diagnosis not present

## 2022-07-17 DIAGNOSIS — E1151 Type 2 diabetes mellitus with diabetic peripheral angiopathy without gangrene: Secondary | ICD-10-CM | POA: Diagnosis not present

## 2022-07-17 DIAGNOSIS — E11621 Type 2 diabetes mellitus with foot ulcer: Secondary | ICD-10-CM | POA: Diagnosis not present

## 2022-07-20 DIAGNOSIS — E11622 Type 2 diabetes mellitus with other skin ulcer: Secondary | ICD-10-CM | POA: Diagnosis not present

## 2022-07-20 DIAGNOSIS — L97222 Non-pressure chronic ulcer of left calf with fat layer exposed: Secondary | ICD-10-CM | POA: Diagnosis not present

## 2022-07-20 DIAGNOSIS — L97512 Non-pressure chronic ulcer of other part of right foot with fat layer exposed: Secondary | ICD-10-CM | POA: Diagnosis not present

## 2022-07-20 DIAGNOSIS — I872 Venous insufficiency (chronic) (peripheral): Secondary | ICD-10-CM | POA: Diagnosis not present

## 2022-07-20 DIAGNOSIS — E11621 Type 2 diabetes mellitus with foot ulcer: Secondary | ICD-10-CM | POA: Diagnosis not present

## 2022-07-20 DIAGNOSIS — L97822 Non-pressure chronic ulcer of other part of left lower leg with fat layer exposed: Secondary | ICD-10-CM | POA: Diagnosis not present

## 2022-07-20 DIAGNOSIS — L97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: Secondary | ICD-10-CM | POA: Diagnosis not present

## 2022-07-21 DIAGNOSIS — M2042 Other hammer toe(s) (acquired), left foot: Secondary | ICD-10-CM | POA: Diagnosis not present

## 2022-07-21 DIAGNOSIS — B351 Tinea unguium: Secondary | ICD-10-CM | POA: Diagnosis not present

## 2022-07-21 DIAGNOSIS — M2041 Other hammer toe(s) (acquired), right foot: Secondary | ICD-10-CM | POA: Diagnosis not present

## 2022-07-21 DIAGNOSIS — E11621 Type 2 diabetes mellitus with foot ulcer: Secondary | ICD-10-CM | POA: Diagnosis not present

## 2022-07-21 DIAGNOSIS — L97512 Non-pressure chronic ulcer of other part of right foot with fat layer exposed: Secondary | ICD-10-CM | POA: Diagnosis not present

## 2022-07-22 DIAGNOSIS — I872 Venous insufficiency (chronic) (peripheral): Secondary | ICD-10-CM | POA: Diagnosis not present

## 2022-07-22 DIAGNOSIS — L97421 Non-pressure chronic ulcer of left heel and midfoot limited to breakdown of skin: Secondary | ICD-10-CM | POA: Diagnosis not present

## 2022-07-22 DIAGNOSIS — E1151 Type 2 diabetes mellitus with diabetic peripheral angiopathy without gangrene: Secondary | ICD-10-CM | POA: Diagnosis not present

## 2022-07-22 DIAGNOSIS — I70202 Unspecified atherosclerosis of native arteries of extremities, left leg: Secondary | ICD-10-CM | POA: Diagnosis not present

## 2022-07-22 DIAGNOSIS — L97519 Non-pressure chronic ulcer of other part of right foot with unspecified severity: Secondary | ICD-10-CM | POA: Diagnosis not present

## 2022-07-22 DIAGNOSIS — E11621 Type 2 diabetes mellitus with foot ulcer: Secondary | ICD-10-CM | POA: Diagnosis not present

## 2022-07-23 LAB — LAB REPORT - SCANNED
A1c: 6
EGFR: 44

## 2022-07-24 DIAGNOSIS — E11621 Type 2 diabetes mellitus with foot ulcer: Secondary | ICD-10-CM | POA: Diagnosis not present

## 2022-07-24 DIAGNOSIS — L97519 Non-pressure chronic ulcer of other part of right foot with unspecified severity: Secondary | ICD-10-CM | POA: Diagnosis not present

## 2022-07-24 DIAGNOSIS — L97421 Non-pressure chronic ulcer of left heel and midfoot limited to breakdown of skin: Secondary | ICD-10-CM | POA: Diagnosis not present

## 2022-07-24 DIAGNOSIS — I70202 Unspecified atherosclerosis of native arteries of extremities, left leg: Secondary | ICD-10-CM | POA: Diagnosis not present

## 2022-07-24 DIAGNOSIS — I872 Venous insufficiency (chronic) (peripheral): Secondary | ICD-10-CM | POA: Diagnosis not present

## 2022-07-24 DIAGNOSIS — E1151 Type 2 diabetes mellitus with diabetic peripheral angiopathy without gangrene: Secondary | ICD-10-CM | POA: Diagnosis not present

## 2022-07-27 ENCOUNTER — Telehealth (HOSPITAL_BASED_OUTPATIENT_CLINIC_OR_DEPARTMENT_OTHER): Payer: Self-pay | Admitting: Family Medicine

## 2022-07-27 DIAGNOSIS — E11621 Type 2 diabetes mellitus with foot ulcer: Secondary | ICD-10-CM | POA: Diagnosis not present

## 2022-07-27 DIAGNOSIS — E11622 Type 2 diabetes mellitus with other skin ulcer: Secondary | ICD-10-CM | POA: Diagnosis not present

## 2022-07-27 DIAGNOSIS — L97512 Non-pressure chronic ulcer of other part of right foot with fat layer exposed: Secondary | ICD-10-CM | POA: Diagnosis not present

## 2022-07-27 DIAGNOSIS — I872 Venous insufficiency (chronic) (peripheral): Secondary | ICD-10-CM | POA: Diagnosis not present

## 2022-07-27 DIAGNOSIS — L97822 Non-pressure chronic ulcer of other part of left lower leg with fat layer exposed: Secondary | ICD-10-CM | POA: Diagnosis not present

## 2022-07-27 DIAGNOSIS — L97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: Secondary | ICD-10-CM | POA: Diagnosis not present

## 2022-07-27 DIAGNOSIS — L97222 Non-pressure chronic ulcer of left calf with fat layer exposed: Secondary | ICD-10-CM | POA: Diagnosis not present

## 2022-07-27 NOTE — Telephone Encounter (Signed)
Contacted Izaih Jaggard Noorani to schedule their annual wellness visit. Appointment made for 08/04/2022.   Thank you,  Judeth Cornfield,  AMB Clinical Support [facility] AWV Program Direct Dial ??[phone]

## 2022-07-29 DIAGNOSIS — I70202 Unspecified atherosclerosis of native arteries of extremities, left leg: Secondary | ICD-10-CM | POA: Diagnosis not present

## 2022-07-29 DIAGNOSIS — L97421 Non-pressure chronic ulcer of left heel and midfoot limited to breakdown of skin: Secondary | ICD-10-CM | POA: Diagnosis not present

## 2022-07-29 DIAGNOSIS — E11621 Type 2 diabetes mellitus with foot ulcer: Secondary | ICD-10-CM | POA: Diagnosis not present

## 2022-07-29 DIAGNOSIS — L97519 Non-pressure chronic ulcer of other part of right foot with unspecified severity: Secondary | ICD-10-CM | POA: Diagnosis not present

## 2022-07-29 DIAGNOSIS — I872 Venous insufficiency (chronic) (peripheral): Secondary | ICD-10-CM | POA: Diagnosis not present

## 2022-07-29 DIAGNOSIS — E1151 Type 2 diabetes mellitus with diabetic peripheral angiopathy without gangrene: Secondary | ICD-10-CM | POA: Diagnosis not present

## 2022-07-30 ENCOUNTER — Telehealth (HOSPITAL_BASED_OUTPATIENT_CLINIC_OR_DEPARTMENT_OTHER): Payer: Self-pay | Admitting: Family Medicine

## 2022-07-30 NOTE — Telephone Encounter (Signed)
Called about fax that was sent a month ago still waiting need order signed and completed. The patient is out of meds compliance at this time.

## 2022-07-31 DIAGNOSIS — I872 Venous insufficiency (chronic) (peripheral): Secondary | ICD-10-CM | POA: Diagnosis not present

## 2022-07-31 DIAGNOSIS — E1151 Type 2 diabetes mellitus with diabetic peripheral angiopathy without gangrene: Secondary | ICD-10-CM | POA: Diagnosis not present

## 2022-07-31 DIAGNOSIS — L97519 Non-pressure chronic ulcer of other part of right foot with unspecified severity: Secondary | ICD-10-CM | POA: Diagnosis not present

## 2022-07-31 DIAGNOSIS — L97421 Non-pressure chronic ulcer of left heel and midfoot limited to breakdown of skin: Secondary | ICD-10-CM | POA: Diagnosis not present

## 2022-07-31 DIAGNOSIS — I70202 Unspecified atherosclerosis of native arteries of extremities, left leg: Secondary | ICD-10-CM | POA: Diagnosis not present

## 2022-07-31 DIAGNOSIS — E11621 Type 2 diabetes mellitus with foot ulcer: Secondary | ICD-10-CM | POA: Diagnosis not present

## 2022-08-03 DIAGNOSIS — L97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: Secondary | ICD-10-CM | POA: Diagnosis not present

## 2022-08-03 DIAGNOSIS — I872 Venous insufficiency (chronic) (peripheral): Secondary | ICD-10-CM | POA: Diagnosis not present

## 2022-08-03 DIAGNOSIS — L97822 Non-pressure chronic ulcer of other part of left lower leg with fat layer exposed: Secondary | ICD-10-CM | POA: Diagnosis not present

## 2022-08-03 DIAGNOSIS — E11622 Type 2 diabetes mellitus with other skin ulcer: Secondary | ICD-10-CM | POA: Diagnosis not present

## 2022-08-03 DIAGNOSIS — N1832 Chronic kidney disease, stage 3b: Secondary | ICD-10-CM | POA: Diagnosis not present

## 2022-08-03 DIAGNOSIS — E11621 Type 2 diabetes mellitus with foot ulcer: Secondary | ICD-10-CM | POA: Diagnosis not present

## 2022-08-03 DIAGNOSIS — L97512 Non-pressure chronic ulcer of other part of right foot with fat layer exposed: Secondary | ICD-10-CM | POA: Diagnosis not present

## 2022-08-04 ENCOUNTER — Ambulatory Visit (INDEPENDENT_AMBULATORY_CARE_PROVIDER_SITE_OTHER): Payer: Medicare Other

## 2022-08-04 ENCOUNTER — Encounter (HOSPITAL_BASED_OUTPATIENT_CLINIC_OR_DEPARTMENT_OTHER): Payer: Self-pay

## 2022-08-04 VITALS — BP 125/73 | HR 97 | Ht 75.0 in | Wt 217.0 lb

## 2022-08-04 DIAGNOSIS — Z1159 Encounter for screening for other viral diseases: Secondary | ICD-10-CM | POA: Diagnosis not present

## 2022-08-04 DIAGNOSIS — Z0000 Encounter for general adult medical examination without abnormal findings: Secondary | ICD-10-CM

## 2022-08-04 LAB — POCT CBG (FASTING - GLUCOSE)-MANUAL ENTRY: Glucose Fasting, POC: 128 mg/dL — AB (ref 70–99)

## 2022-08-04 LAB — LAB REPORT - SCANNED: EGFR: 46

## 2022-08-04 NOTE — Progress Notes (Signed)
 I connected with  [name] on 08/04/22 by a audio enabled telemedicine application and verified that I am speaking with the correct person using two identifiers.  Patient Location: Home  Provider Location: Home Office  I discussed the limitations of evaluation and management by telemedicine. The patient expressed understanding and agreed to proceed. Subjective:   [name] is a 77 y.o. male who presents for an Initial Medicare Annual Wellness Visit.  Review of Systems     Cardiac Risk Factors include: advanced age (>79men, >66 women);diabetes mellitus;dyslipidemia;hypertension;male gender;sedentary lifestyle     Objective:    Today's Vitals   08/04/22 0905  BP: 125/73  Pulse: 97  Weight: 217 lb (98.4 kg)  Height: 6\' 3"  (1.905 m)  PainSc: 0-No pain   Body mass index is 27.12 kg/m.     08/04/2022    9:27 AM 05/04/2022    1:15 PM 05/03/2022    1:45 PM 05/03/2022    7:15 AM 08/27/2016    6:50 PM 03/01/2015    6:16 AM 02/22/2015    2:23 PM  Advanced Directives  Does Patient Have a Medical Advance Directive? Yes Yes Yes No No No No  Type of Estate agent of [place];Living will  Living will;Healthcare Power of Attorney      Does patient want to make changes to medical advance directive?   No - Patient declined      Copy of Healthcare Power of Attorney in Chart? No - copy requested  No - copy requested      Would patient like information on creating a medical advance directive?     No - Patient declined No - patient declined information     Current Medications (verified) Outpatient Encounter Medications as of 08/04/2022  Medication Sig   apixaban (ELIQUIS) 5 MG TABS tablet Take 1 tablet (5 mg total) by mouth 2 (two) times daily.   feeding supplement (ENSURE ENLIVE / ENSURE PLUS) LIQD Take 237 mLs by mouth 2 (two) times daily between meals.   furosemide (LASIX) 20 MG tablet Take by mouth.   glipiZIDE (GLUCOTROL XL) 2.5 MG 24 hr tablet TAKE  1 TABLET BY MOUTH DAILY WITH BREAKFAST   Glucose Blood (FREESTYLE LITE TEST VI)    Iron, Ferrous Sulfate, 325 (65 Fe) MG TABS Take 325 mg by mouth daily.   midodrine (PROAMATINE) 10 MG tablet Take 1 tablet (10 mg total) by mouth 3 (three) times daily with meals.   NASADROPS SALINE ON THE GO 0.9 % SOLN Apply topically.   NON FORMULARY Mix 1 scoop (500mg ) of powder and 15mL of saline; spray onto affected area(s) once daily or as directed. Discard after 4 hours.   silver sulfADIAZINE (SILVADENE) 1 % cream Apply topically.   simvastatin (ZOCOR) 20 MG tablet Take 1 tablet (20 mg total) by mouth at bedtime.   tamsulosin (FLOMAX) 0.4 MG CAPS capsule Take 0.4 mg by mouth daily.   Wound Cleansers (VASHE CLEANSING) SOLN Apply topically.   acetaminophen (TYLENOL) 500 MG tablet Take 1,000 mg by mouth as needed for mild pain.   allopurinol (ZYLOPRIM) 100 MG tablet Take 1 tablet (100 mg total) by mouth 2 (two) times daily.   ciclopirox (PENLAC) 8 % solution Apply topically.   famotidine (PEPCID) 20 MG tablet TAKE 1 TABLET BY MOUTH DAILY - KEEP APPOINTMENT FOR FURTHER REFILLS (Patient not taking: Reported on 07/09/2022)   lidocaine (LIDODERM) 5 % Place 1 patch onto the skin as needed. Remove &  Discard patch within 12 hours or as directed by MD (Patient not taking: Reported on 08/04/2022)   metoprolol tartrate (LOPRESSOR) 25 MG tablet Take 0.5 tablets (12.5 mg total) by mouth 2 (two) times daily. (Patient not taking: Reported on 08/04/2022)   pantoprazole (PROTONIX) 40 MG tablet TAKE 1 TABLET BY MOUTH DAILY (Patient not taking: Reported on 08/04/2022)   tadalafil (CIALIS) 10 MG tablet Take 1 tablet (10 mg total) by mouth daily as needed. (Patient not taking: Reported on 08/04/2022)   terbinafine (LAMISIL) 250 MG tablet Take 1 tablet by mouth daily. (Patient not taking: Reported on 08/04/2022)   No facility-administered encounter medications on file as of 08/04/2022.    Allergies (verified) Atorvastatin and  Lisinopril   History: Past Medical History:  Diagnosis Date   Atrial fibrillation (HCC)    Chronic anticoagulation    Complication of anesthesia    ' hard time waking up" per patient    Diabetes mellitus    type 2   GERD (gastroesophageal reflux disease)    Hyperlipidemia    Hypertension    Mitral regurgitation    a. 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HCC)    chronic atrial fib   Pulmonary hypertension (HCC)    Pulmonary hypertension (HCC)    Past Surgical History:  Procedure Laterality Date   BIOPSY  05/04/2022   Procedure: BIOPSY;  Surgeon: Tressia Danas, MD;  Location: [facility] ENDOSCOPY;  Service: Gastroenterology;;   CARDIOVERSION     CHOLECYSTECTOMY N/A 03/01/2015   Procedure: LAPAROSCOPIC CHOLECYSTECTOMY;  Surgeon: Axel Filler, MD;  Location: [facility] ORS;  Service: General;  Laterality: N/A;   ESOPHAGOGASTRODUODENOSCOPY (EGD) WITH PROPOFOL N/A 05/04/2022   Procedure: ESOPHAGOGASTRODUODENOSCOPY (EGD) WITH PROPOFOL;  Surgeon: Tressia Danas, MD;  Location: [facility] ENDOSCOPY;  Service: Gastroenterology;  Laterality: N/A;   spur on heel     Family History  Problem Relation Age of Onset   Hypertension Mother    Alzheimer's disease Mother    Diabetes Mother    Heart disease Father        cabgx2, respiratory failure, pacemaker   Diabetes Brother    Hyperlipidemia Brother    Colon cancer Neg Hx    Esophageal cancer Neg Hx    Rectal cancer Neg Hx    Stomach cancer Neg Hx    Social History   Socioeconomic History   Marital status: Married    Spouse name: Not on file   Number of children: 2   Years of education: Not on file   Highest education level: Not on file  Occupational History   Occupation: retired Copywriter, advertising  Tobacco Use   Smoking status: Never   Smokeless tobacco: Never  Vaping Use   Vaping Use: Never used  Substance and Sexual Activity   Alcohol use: Not Currently   Drug  use: No   Sexual activity: Not on file  Other Topics Concern   Not on file  Social History Narrative   Not on file   Social Determinants of Health   Financial Resource Strain: Low Risk  (08/04/2022)   Overall Financial Resource Strain (CARDIA)    Difficulty of Paying Living Expenses: Not hard at all  Food Insecurity: No Food Insecurity (08/04/2022)   Hunger Vital Sign    Worried About Running Out of Food in the Last Year: Never true    Ran Out of Food in the Last Year: Never true  Transportation  Needs: No Transportation Needs (08/04/2022)   PRAPARE - Administrator, Civil Service (Medical): No    Lack of Transportation (Non-Medical): No  Physical Activity: Inactive (08/04/2022)   Exercise Vital Sign    Days of Exercise per Week: 0 days    Minutes of Exercise per Session: 0 min  Stress: No Stress Concern Present (08/04/2022)   [employer] of Occupational Health - Occupational Stress Questionnaire    Feeling of Stress : Not at all  Social Connections: Socially Integrated (08/04/2022)   Social Connection and Isolation Panel [NHANES]    Frequency of Communication with Friends and Family: More than three times a week    Frequency of Social Gatherings with Friends and Family: More than three times a week    Attends Religious Services: More than 4 times per year    Active Member of [employer] or Organizations: Yes    Attends Engineer, structural: More than 4 times per year    Marital Status: Married    Tobacco Counseling Counseling given: Yes   Clinical Intake:  Pre-visit preparation completed: Yes  Pain : No/denies pain Pain Score: 0-No pain     BMI - recorded: 27.12 Nutritional Status: BMI 25 -29 Overweight Nutritional Risks: None Diabetes: Yes CBG done?: Yes (BG-128 (patient provided cbg)) CBG resulted in Enter/ Edit results?: Yes Did pt. bring in CBG monitor from home?: No (telephone visit)  How often do you need to have someone help you when you  read instructions, pamphlets, or other written materials from your doctor or pharmacy?: 1 - Never  Diabetic?yes  Nutrition Risk Assessment:  Has the patient had any N/V/D within the last 2 months?  No  Does the patient have any non-healing wounds?  Yes  Has the patient had any unintentional weight loss or weight gain?  No   Diabetes:  Is the patient diabetic?  Yes  If diabetic, was a CBG obtained today?  No  telephone visit, patient stated his cbg from yesterday morning was 128 mg/dl Did the patient bring in their glucometer from home?  No  How often do you monitor your CBG's? daily.   Financial Strains and Diabetes Management:  Are you having any financial strains with the device, your supplies or your medication? No .  Does the patient want to be seen by Chronic Care Management for management of their diabetes?  No  Would the patient like to be referred to a Nutritionist or for Diabetic Management?  No   Diabetic Exams:  Diabetic Eye Exam: Completed April 2024 Diabetic Foot Exam: Completed April 2024   Interpreter Needed?: No  Information entered by ::  Kaizley Aja, CMA   Activities of Daily Living    08/04/2022    9:23 AM 05/18/2022   11:04 AM  In your present state of health, do you have any difficulty performing the following activities:  Hearing? 0 0  Vision? 0 0  Difficulty concentrating or making decisions? 0 0  Walking or climbing stairs? 1 0  Comment walks with a cane   Dressing or bathing? 0 0  Doing errands, shopping? 0 0  Preparing Food and eating ? N   Using the Toilet? N   In the past six months, have you accidently leaked urine? N   Do you have problems with loss of bowel control? N   Managing your Medications? N   Managing your Finances? N   Housekeeping or managing your Housekeeping? N     Patient  Care Team: [place], Buren Kos, MD as PCP - General (Family Medicine) [place], Peter M, MD as PCP - Cardiology (Cardiology) Dial, Durenda Hurt, DPM as  Referring Physician (Podiatry) Sheppard Plumber, MD as Referring Physician (Family Medicine) Jenel Lucks, MD as Consulting Physician (Gastroenterology)  Indicate any recent Medical Services you may have received from other than [facility] providers in the past year (date may be approximate).     Assessment:   This is a routine wellness examination for Bosie.  Hearing/Vision screen Hearing Screening - Comments:: Patient denies any hearing difficulties.   Vision Screening - Comments:: Patient wears glasses and is utd with his yearly eye exam   Dietary issues and exercise activities discussed: Current Exercise Habits: The patient does not participate in regular exercise at present, Exercise limited by: orthopedic condition(s)   Goals Addressed             This Visit's Progress    Patient Stated       Patient stated he wants to work on getting healthier and getting the wound on his LT lower leg at his calf completely healed. He wants to get his strength back.        Depression Screen    08/04/2022    9:21 AM 05/18/2022   11:04 AM 03/24/2022    3:19 PM 03/18/2022    1:36 PM 12/29/2021    3:09 PM 09/01/2021    1:32 PM 06/26/2021    2:37 PM  PHQ 2/9 Scores  PHQ - 2 Score 0 0 0 0 0 0 0  PHQ- 9 Score  0 0 0 0    Exception Documentation  Medical reason Medical reason Medical reason Medical reason Medical reason     Fall Risk    08/04/2022    9:19 AM 05/18/2022   11:03 AM 03/24/2022    3:18 PM 03/18/2022    1:35 PM 12/29/2021    3:08 PM  Fall Risk   Falls in the past year? 0 1 0 0 0  Number falls in past yr: 0 0 0 0 0  Injury with Fall? 0 0 0 0 0  Risk for fall due to : Impaired balance/gait;Impaired mobility No Fall Risks No Fall Risks No Fall Risks No Fall Risks  Follow up Falls prevention discussed;Education provided Falls evaluation completed Falls evaluation completed Falls evaluation completed Falls evaluation completed    FALL RISK PREVENTION PERTAINING TO THE HOME:  Any  stairs in or around the home? Yes  If so, are there any without handrails? No  Home free of loose throw rugs in walkways, pet beds, electrical cords, etc? Yes  Adequate lighting in your home to reduce risk of falls? Yes   ASSISTIVE DEVICES UTILIZED TO PREVENT FALLS:  Life alert? No  Use of a cane, walker or w/c? Yes  Grab bars in the bathroom? No  Shower chair or bench in shower? No  Elevated toilet seat or a handicapped toilet? No   TIMED UP AND GO:  Was the test performed? No . Telephone visit  Cognitive Function:      10/09/2020    9:00 AM  Montreal Cognitive Assessment   Visuospatial/ Executive (0/5) 2  Naming (0/3) 3  Attention: Read list of digits (0/2) 0  Attention: Read list of letters (0/1) 1  Attention: Serial 7 subtraction starting at 100 (0/3) 0  Language: Repeat phrase (0/2) 1  Language : Fluency (0/1) 0  Abstraction (0/2) 1  Delayed Recall (0/5) 1  Orientation (0/6) 6  Total 15  Adjusted Score (based on education) 15      08/04/2022    9:24 AM  6CIT Screen  What Year? 0 points  What month? 0 points  What time? 0 points  Count back from 20 0 points  Months in reverse 0 points  Repeat phrase 0 points  Total Score 0 points    Immunizations Immunization History  Administered Date(s) Administered   Influenza-Unspecified 11/14/2013   Moderna Sars-Covid-2 Vaccination 06/07/2019, 07/05/2019    TDAP status: Due, Education has been provided regarding the importance of this vaccine. Advised may receive this vaccine at local pharmacy or Health Dept. Aware to provide a copy of the vaccination record if obtained from local pharmacy or Health Dept. Verbalized acceptance and understanding.  Flu Vaccine status: Up to date  Pneumococcal vaccine status: Declined,  Education has been provided regarding the importance of this vaccine but patient still declined. Advised may receive this vaccine at local pharmacy or Health Dept. Aware to provide a copy of the  vaccination record if obtained from local pharmacy or Health Dept. Verbalized acceptance and understanding.   Covid-19 vaccine status: Completed vaccines  Qualifies for Shingles Vaccine? Yes   Zostavax completed No   Shingrix Completed?: No.    Education has been provided regarding the importance of this vaccine. Patient has been advised to call insurance company to determine out of pocket expense if they have not yet received this vaccine. Advised may also receive vaccine at local pharmacy or Health Dept. Verbalized acceptance and understanding.  Patient states he never had chicken pox as a child and doesn't need the shingrix vaccine Screening Tests Health Maintenance  Topic Date Due   Hepatitis C Screening  Never done   DTaP/Tdap/Td (1 - Tdap) Never done   Zoster Vaccines- Shingrix (1 of 2) Never done   Pneumonia Vaccine 37+ Years old (1 of 1 - PCV) Never done   COVID-19 Vaccine (3 - Moderna risk series) 08/02/2019   Diabetic kidney evaluation - Urine ACR  01/23/2022   HEMOGLOBIN A1C  09/22/2022   INFLUENZA VACCINE  10/15/2022   FOOT EXAM  06/15/2023   OPHTHALMOLOGY EXAM  06/15/2023   Diabetic kidney evaluation - eGFR measurement  07/09/2023   Medicare Annual Wellness (AWV)  08/04/2023   COLONOSCOPY (Pts 45-9yrs Insurance coverage will need to be confirmed)  06/29/2032   HPV VACCINES  Aged Out    Health Maintenance  Health Maintenance Due  Topic Date Due   Hepatitis C Screening  Never done   DTaP/Tdap/Td (1 - Tdap) Never done   Zoster Vaccines- Shingrix (1 of 2) Never done   Pneumonia Vaccine 76+ Years old (1 of 1 - PCV) Never done   COVID-19 Vaccine (3 - Moderna risk series) 08/02/2019   Diabetic kidney evaluation - Urine ACR  01/23/2022    Colorectal cancer screening: Type of screening: Colonoscopy. Completed 06/30/2022. Repeat every discontinued due to patients age years  Lung Cancer Screening: (Low Dose CT Chest recommended if Age 30-80 years, 30 pack-year currently  smoking OR have quit w/in 15years.) does not qualify.     Additional Screening:  Hepatitis C Screening: does qualify; Ordered 08/04/22  Vision Screening: Recommended annual ophthalmology exams for early detection of glaucoma and other disorders of the eye. Is the patient up to date with their annual eye exam?  Yes  Who is the provider or what is the name of the office in which the patient attends annual eye exams?  [facility] If pt is not established with a provider, would they like to be referred to a provider to establish care?  N/A .   Dental Screening: Recommended annual dental exams for proper oral hygiene  Community Resource Referral / Chronic Care Management: CRR required this visit?  No   CCM required this visit?  No      Plan:     I have personally reviewed and noted the following in the patient's chart:   Medical and social history Use of alcohol, tobacco or illicit drugs  Current medications and supplements including opioid prescriptions. Patient is not currently taking opioid prescriptions. Functional ability and status Nutritional status Physical activity Advanced directives List of other physicians Hospitalizations, surgeries, and ER visits in previous 12 months Vitals Screenings to include cognitive, depression, and falls Referrals and appointments  In addition, I have reviewed and discussed with patient certain preventive protocols, quality metrics, and best practice recommendations. A written personalized care plan for preventive services as well as general preventive health recommendations were provided to patient.   Due to this being a telephonic visit, the after visit summary with patients personalized plan was offered to patient via mail or my-chart. Patient would like to access their AVS via my-chart    Jordan Hawks Acxel Dingee, CMA   08/04/2022   Nurse Notes: Hep C Screening ordered today

## 2022-08-04 NOTE — Patient Instructions (Signed)
[name] , Thank you for taking time to come for your Medicare Wellness Visit. I appreciate your ongoing commitment to your health goals. Please review the following plan we discussed and let me know if I can assist you in the future.   These are the goals we discussed:  Goals   None     This is a list of the screening recommended for you and due dates:  Health Maintenance  Topic Date Due   Complete foot exam   Never done   Eye exam for diabetics  Never done   Hepatitis C Screening: USPSTF Recommendation to screen - Ages 76-79 yo.  Never done   DTaP/Tdap/Td vaccine (1 - Tdap) Never done   Zoster (Shingles) Vaccine (1 of 2) Never done   Pneumonia Vaccine (1 of 1 - PCV) Never done   COVID-19 Vaccine (3 - Moderna risk series) 08/02/2019   Yearly kidney health urinalysis for diabetes  01/23/2022   Hemoglobin A1C  09/22/2022   Flu Shot  10/15/2022   Yearly kidney function blood test for diabetes  07/09/2023   Medicare Annual Wellness Visit  08/04/2023   Colon Cancer Screening  06/29/2032   HPV Vaccine  Aged Out    Advanced directives: Please bring a copy of your health care power of attorney and living will to the office to be added to your chart at your convenience.   Conditions/risks identified: Aim for 30 minutes of exercise or brisk walking, 6-8 glasses of water, and 5 servings of fruits and vegetables each day.   Next appointment: Follow up in one year for your annual wellness visit. Aug 10, 2023 @ 9:00am via telephone  Preventive Care 76 Years and Older, Male  Preventive care refers to lifestyle choices and visits with your health care provider that can promote health and wellness. What does preventive care include? A yearly physical exam. This is also called an annual well check. Dental exams once or twice a year. Routine eye exams. Ask your health care provider how often you should have your eyes checked. Personal lifestyle choices, including: Daily care of your teeth  and gums. Regular physical activity. Eating a healthy diet. Avoiding tobacco and drug use. Limiting alcohol use. Practicing safe sex. Taking low doses of aspirin every day. Taking vitamin and mineral supplements as recommended by your health care provider. What happens during an annual well check? The services and screenings done by your health care provider during your annual well check will depend on your age, overall health, lifestyle risk factors, and family history of disease. Counseling  Your health care provider may ask you questions about your: Alcohol use. Tobacco use. Drug use. Emotional well-being. Home and relationship well-being. Sexual activity. Eating habits. History of falls. Memory and ability to understand (cognition). Work and work Astronomer. Screening  You may have the following tests or measurements: Height, weight, and BMI. Blood pressure. Lipid and cholesterol levels. These may be checked every 5 years, or more frequently if you are over 32 years old. Skin check. Lung cancer screening. You may have this screening every year starting at age 27 if you have a 30-pack-year history of smoking and currently smoke or have quit within the past 15 years. Fecal occult blood test (FOBT) of the stool. You may have this test every year starting at age 73. Flexible sigmoidoscopy or colonoscopy. You may have a sigmoidoscopy every 5 years or a colonoscopy every 10 years starting at age 76. Prostate cancer screening. Recommendations will  vary depending on your family history and other risks. Hepatitis C blood test. Hepatitis B blood test. Sexually transmitted disease (STD) testing. Diabetes screening. This is done by checking your blood sugar (glucose) after you have not eaten for a while (fasting). You may have this done every 1-3 years. Abdominal aortic aneurysm (AAA) screening. You may need this if you are a current or former smoker. Osteoporosis. You may be screened  starting at age 27 if you are at high risk. Talk with your health care provider about your test results, treatment options, and if necessary, the need for more tests. Vaccines  Your health care provider may recommend certain vaccines, such as: Influenza vaccine. This is recommended every year. Tetanus, diphtheria, and acellular pertussis (Tdap, Td) vaccine. You may need a Td booster every 10 years. Zoster vaccine. You may need this after age 76. Pneumococcal 13-valent conjugate (PCV13) vaccine. One dose is recommended after age 76. Pneumococcal polysaccharide (PPSV23) vaccine. One dose is recommended after age 76. Talk to your health care provider about which screenings and vaccines you need and how often you need them. This information is not intended to replace advice given to you by your health care provider. Make sure you discuss any questions you have with your health care provider. Document Released: 03/29/2015 Document Revised: 11/20/2015 Document Reviewed: 01/01/2015 Elsevier Interactive Patient Education  2017 [employer].  Fall Prevention in the Home Falls can cause injuries. They can happen to people of all ages. There are many things you can do to make your home safe and to help prevent falls. What can I do on the outside of my home? Regularly fix the edges of walkways and driveways and fix any cracks. Remove anything that might make you trip as you walk through a door, such as a raised step or threshold. Trim any bushes or trees on the path to your home. Use bright outdoor lighting. Clear any walking paths of anything that might make someone trip, such as rocks or tools. Regularly check to see if handrails are loose or broken. Make sure that both sides of any steps have handrails. Any raised decks and porches should have guardrails on the edges. Have any leaves, snow, or ice cleared regularly. Use sand or salt on walking paths during winter. Clean up any spills in your garage  right away. This includes oil or grease spills. What can I do in the bathroom? Use night lights. Install grab bars by the toilet and in the tub and shower. Do not use towel bars as grab bars. Use non-skid mats or decals in the tub or shower. If you need to sit down in the shower, use a plastic, non-slip stool. Keep the floor dry. Clean up any water that spills on the floor as soon as it happens. Remove soap buildup in the tub or shower regularly. Attach bath mats securely with double-sided non-slip rug tape. Do not have throw rugs and other things on the floor that can make you trip. What can I do in the bedroom? Use night lights. Make sure that you have a light by your bed that is easy to reach. Do not use any sheets or blankets that are too big for your bed. They should not hang down onto the floor. Have a firm chair that has side arms. You can use this for support while you get dressed. Do not have throw rugs and other things on the floor that can make you trip. What can I do in  the kitchen? Clean up any spills right away. Avoid walking on wet floors. Keep items that you use a lot in easy-to-reach places. If you need to reach something above you, use a strong step stool that has a grab bar. Keep electrical cords out of the way. Do not use floor polish or wax that makes floors slippery. If you must use wax, use non-skid floor wax. Do not have throw rugs and other things on the floor that can make you trip. What can I do with my stairs? Do not leave any items on the stairs. Make sure that there are handrails on both sides of the stairs and use them. Fix handrails that are broken or loose. Make sure that handrails are as long as the stairways. Check any carpeting to make sure that it is firmly attached to the stairs. Fix any carpet that is loose or worn. Avoid having throw rugs at the top or bottom of the stairs. If you do have throw rugs, attach them to the floor with carpet tape. Make  sure that you have a light switch at the top of the stairs and the bottom of the stairs. If you do not have them, ask someone to add them for you. What else can I do to help prevent falls? Wear shoes that: Do not have high heels. Have rubber bottoms. Are comfortable and fit you well. Are closed at the toe. Do not wear sandals. If you use a stepladder: Make sure that it is fully opened. Do not climb a closed stepladder. Make sure that both sides of the stepladder are locked into place. Ask someone to hold it for you, if possible. Clearly mark and make sure that you can see: Any grab bars or handrails. First and last steps. Where the edge of each step is. Use tools that help you move around (mobility aids) if they are needed. These include: Canes. Walkers. Scooters. Crutches. Turn on the lights when you go into a dark area. Replace any light bulbs as soon as they burn out. Set up your furniture so you have a clear path. Avoid moving your furniture around. If any of your floors are uneven, fix them. If there are any pets around you, be aware of where they are. Review your medicines with your doctor. Some medicines can make you feel dizzy. This can increase your chance of falling. Ask your doctor what other things that you can do to help prevent falls. This information is not intended to replace advice given to you by your health care provider. Make sure you discuss any questions you have with your health care provider. Document Released: 12/27/2008 Document Revised: 08/08/2015 Document Reviewed: 04/06/2014 Elsevier Interactive Patient Education  2017 [employer].

## 2022-08-05 ENCOUNTER — Telehealth (HOSPITAL_BASED_OUTPATIENT_CLINIC_OR_DEPARTMENT_OTHER): Payer: Self-pay | Admitting: Family Medicine

## 2022-08-05 DIAGNOSIS — L97519 Non-pressure chronic ulcer of other part of right foot with unspecified severity: Secondary | ICD-10-CM | POA: Diagnosis not present

## 2022-08-05 DIAGNOSIS — E1151 Type 2 diabetes mellitus with diabetic peripheral angiopathy without gangrene: Secondary | ICD-10-CM | POA: Diagnosis not present

## 2022-08-05 DIAGNOSIS — E11621 Type 2 diabetes mellitus with foot ulcer: Secondary | ICD-10-CM | POA: Diagnosis not present

## 2022-08-05 DIAGNOSIS — I70202 Unspecified atherosclerosis of native arteries of extremities, left leg: Secondary | ICD-10-CM | POA: Diagnosis not present

## 2022-08-05 DIAGNOSIS — L97421 Non-pressure chronic ulcer of left heel and midfoot limited to breakdown of skin: Secondary | ICD-10-CM | POA: Diagnosis not present

## 2022-08-05 DIAGNOSIS — I872 Venous insufficiency (chronic) (peripheral): Secondary | ICD-10-CM | POA: Diagnosis not present

## 2022-08-05 NOTE — Telephone Encounter (Signed)
Called pt and spoke directly to his wife in regards to him need a prescription filled. She stated he didn't need anything at the moment. No paperwork was received on my end regards to anything.

## 2022-08-05 NOTE — Telephone Encounter (Signed)
2nd call in regards to paperwork needed to be signed for Healthcare...   Called about fax that was sent a month ago still waiting need order signed and completed. The patient is out of meds compliance at this time.

## 2022-08-06 NOTE — Telephone Encounter (Signed)
Paperwork was received by me. Dr. De Peru has signed off and I faxed paperwork off on 08-06-22 @ 8:37 am.

## 2022-08-07 DIAGNOSIS — I70202 Unspecified atherosclerosis of native arteries of extremities, left leg: Secondary | ICD-10-CM | POA: Diagnosis not present

## 2022-08-07 DIAGNOSIS — I872 Venous insufficiency (chronic) (peripheral): Secondary | ICD-10-CM | POA: Diagnosis not present

## 2022-08-07 DIAGNOSIS — L97519 Non-pressure chronic ulcer of other part of right foot with unspecified severity: Secondary | ICD-10-CM | POA: Diagnosis not present

## 2022-08-07 DIAGNOSIS — L97421 Non-pressure chronic ulcer of left heel and midfoot limited to breakdown of skin: Secondary | ICD-10-CM | POA: Diagnosis not present

## 2022-08-07 DIAGNOSIS — E1151 Type 2 diabetes mellitus with diabetic peripheral angiopathy without gangrene: Secondary | ICD-10-CM | POA: Diagnosis not present

## 2022-08-07 DIAGNOSIS — E11621 Type 2 diabetes mellitus with foot ulcer: Secondary | ICD-10-CM | POA: Diagnosis not present

## 2022-08-10 DIAGNOSIS — N1832 Chronic kidney disease, stage 3b: Secondary | ICD-10-CM | POA: Diagnosis not present

## 2022-08-10 DIAGNOSIS — F419 Anxiety disorder, unspecified: Secondary | ICD-10-CM | POA: Diagnosis not present

## 2022-08-10 DIAGNOSIS — Z9181 History of falling: Secondary | ICD-10-CM | POA: Diagnosis not present

## 2022-08-10 DIAGNOSIS — M310 Hypersensitivity angiitis: Secondary | ICD-10-CM | POA: Diagnosis not present

## 2022-08-10 DIAGNOSIS — I70202 Unspecified atherosclerosis of native arteries of extremities, left leg: Secondary | ICD-10-CM | POA: Diagnosis not present

## 2022-08-10 DIAGNOSIS — I872 Venous insufficiency (chronic) (peripheral): Secondary | ICD-10-CM | POA: Diagnosis not present

## 2022-08-10 DIAGNOSIS — Z85828 Personal history of other malignant neoplasm of skin: Secondary | ICD-10-CM | POA: Diagnosis not present

## 2022-08-10 DIAGNOSIS — Z85820 Personal history of malignant melanoma of skin: Secondary | ICD-10-CM | POA: Diagnosis not present

## 2022-08-10 DIAGNOSIS — N179 Acute kidney failure, unspecified: Secondary | ICD-10-CM | POA: Diagnosis not present

## 2022-08-10 DIAGNOSIS — N400 Enlarged prostate without lower urinary tract symptoms: Secondary | ICD-10-CM | POA: Diagnosis not present

## 2022-08-10 DIAGNOSIS — E1122 Type 2 diabetes mellitus with diabetic chronic kidney disease: Secondary | ICD-10-CM | POA: Diagnosis not present

## 2022-08-10 DIAGNOSIS — I129 Hypertensive chronic kidney disease with stage 1 through stage 4 chronic kidney disease, or unspecified chronic kidney disease: Secondary | ICD-10-CM | POA: Diagnosis not present

## 2022-08-10 DIAGNOSIS — E785 Hyperlipidemia, unspecified: Secondary | ICD-10-CM | POA: Diagnosis not present

## 2022-08-10 DIAGNOSIS — E1151 Type 2 diabetes mellitus with diabetic peripheral angiopathy without gangrene: Secondary | ICD-10-CM | POA: Diagnosis not present

## 2022-08-10 DIAGNOSIS — L97421 Non-pressure chronic ulcer of left heel and midfoot limited to breakdown of skin: Secondary | ICD-10-CM | POA: Diagnosis not present

## 2022-08-10 DIAGNOSIS — Z7901 Long term (current) use of anticoagulants: Secondary | ICD-10-CM | POA: Diagnosis not present

## 2022-08-10 DIAGNOSIS — L97519 Non-pressure chronic ulcer of other part of right foot with unspecified severity: Secondary | ICD-10-CM | POA: Diagnosis not present

## 2022-08-10 DIAGNOSIS — E11621 Type 2 diabetes mellitus with foot ulcer: Secondary | ICD-10-CM | POA: Diagnosis not present

## 2022-08-10 DIAGNOSIS — K269 Duodenal ulcer, unspecified as acute or chronic, without hemorrhage or perforation: Secondary | ICD-10-CM | POA: Diagnosis not present

## 2022-08-10 DIAGNOSIS — K317 Polyp of stomach and duodenum: Secondary | ICD-10-CM | POA: Diagnosis not present

## 2022-08-10 DIAGNOSIS — Z7984 Long term (current) use of oral hypoglycemic drugs: Secondary | ICD-10-CM | POA: Diagnosis not present

## 2022-08-10 DIAGNOSIS — I4819 Other persistent atrial fibrillation: Secondary | ICD-10-CM | POA: Diagnosis not present

## 2022-08-10 DIAGNOSIS — M109 Gout, unspecified: Secondary | ICD-10-CM | POA: Diagnosis not present

## 2022-08-11 DIAGNOSIS — N2581 Secondary hyperparathyroidism of renal origin: Secondary | ICD-10-CM | POA: Diagnosis not present

## 2022-08-11 DIAGNOSIS — N1832 Chronic kidney disease, stage 3b: Secondary | ICD-10-CM | POA: Diagnosis not present

## 2022-08-11 DIAGNOSIS — N189 Chronic kidney disease, unspecified: Secondary | ICD-10-CM | POA: Diagnosis not present

## 2022-08-11 DIAGNOSIS — I129 Hypertensive chronic kidney disease with stage 1 through stage 4 chronic kidney disease, or unspecified chronic kidney disease: Secondary | ICD-10-CM | POA: Diagnosis not present

## 2022-08-11 DIAGNOSIS — D631 Anemia in chronic kidney disease: Secondary | ICD-10-CM | POA: Diagnosis not present

## 2022-08-12 ENCOUNTER — Other Ambulatory Visit: Payer: Self-pay

## 2022-08-12 ENCOUNTER — Telehealth: Payer: Self-pay | Admitting: Gastroenterology

## 2022-08-12 DIAGNOSIS — I872 Venous insufficiency (chronic) (peripheral): Secondary | ICD-10-CM | POA: Diagnosis not present

## 2022-08-12 DIAGNOSIS — E11621 Type 2 diabetes mellitus with foot ulcer: Secondary | ICD-10-CM | POA: Diagnosis not present

## 2022-08-12 DIAGNOSIS — I70202 Unspecified atherosclerosis of native arteries of extremities, left leg: Secondary | ICD-10-CM | POA: Diagnosis not present

## 2022-08-12 DIAGNOSIS — L97519 Non-pressure chronic ulcer of other part of right foot with unspecified severity: Secondary | ICD-10-CM | POA: Diagnosis not present

## 2022-08-12 DIAGNOSIS — K219 Gastro-esophageal reflux disease without esophagitis: Secondary | ICD-10-CM

## 2022-08-12 DIAGNOSIS — L97421 Non-pressure chronic ulcer of left heel and midfoot limited to breakdown of skin: Secondary | ICD-10-CM | POA: Diagnosis not present

## 2022-08-12 DIAGNOSIS — E1151 Type 2 diabetes mellitus with diabetic peripheral angiopathy without gangrene: Secondary | ICD-10-CM | POA: Diagnosis not present

## 2022-08-12 MED ORDER — PANTOPRAZOLE SODIUM 40 MG PO TBEC: 40.0000 mg | DELAYED_RELEASE_TABLET | Freq: Every day | ORAL | 3 refills | Status: DC

## 2022-08-12 NOTE — Telephone Encounter (Signed)
Returned patients call letting him know he is to continue pantoprazole and the prescription was sent to [facility] on [address].

## 2022-08-12 NOTE — Telephone Encounter (Signed)
Patient called stating that [facility] has not heard anything back regarding refill for pantoprazole. He is requesting a call back to discuss whether he still needs to take medication. Please advise, thank you.

## 2022-08-14 ENCOUNTER — Other Ambulatory Visit (HOSPITAL_BASED_OUTPATIENT_CLINIC_OR_DEPARTMENT_OTHER): Payer: Self-pay

## 2022-08-14 DIAGNOSIS — I70202 Unspecified atherosclerosis of native arteries of extremities, left leg: Secondary | ICD-10-CM | POA: Diagnosis not present

## 2022-08-14 DIAGNOSIS — E11621 Type 2 diabetes mellitus with foot ulcer: Secondary | ICD-10-CM | POA: Diagnosis not present

## 2022-08-14 DIAGNOSIS — I872 Venous insufficiency (chronic) (peripheral): Secondary | ICD-10-CM | POA: Diagnosis not present

## 2022-08-14 DIAGNOSIS — L97421 Non-pressure chronic ulcer of left heel and midfoot limited to breakdown of skin: Secondary | ICD-10-CM | POA: Diagnosis not present

## 2022-08-14 DIAGNOSIS — E1151 Type 2 diabetes mellitus with diabetic peripheral angiopathy without gangrene: Secondary | ICD-10-CM | POA: Diagnosis not present

## 2022-08-14 DIAGNOSIS — N139 Obstructive and reflux uropathy, unspecified: Secondary | ICD-10-CM

## 2022-08-14 DIAGNOSIS — L97519 Non-pressure chronic ulcer of other part of right foot with unspecified severity: Secondary | ICD-10-CM | POA: Diagnosis not present

## 2022-08-14 MED ORDER — TAMSULOSIN HCL 0.4 MG PO CAPS: 0.4000 mg | ORAL_CAPSULE | Freq: Every day | ORAL | 0 refills | Status: DC

## 2022-08-14 NOTE — Progress Notes (Unsigned)
[address] [place], [place]  [address] Phone: [phone] Fax:  [phone]  Date:  05/27/2022   ID:  [name], [name] Dec 17, 1945, MRN [id number]  PCP:  de Peru, Raymond J, MD  Cardiologist:  Dr. Jacolyn Joaquin [place]     History of Present Illness: [name] is a 77 y.o. male seen for follow up Afib. He has a  hx of permanent AFib, mod MR, pulmonary HTN, HTN, HL, T2DM.  Echo (09/2011):  EF 55%, mod MR, severe LAE, severe RAE, PASP 57-61, trivial eff. Apparently  had Afib dating back to the 1970s while in the service in [place].   He underwent lap cholecystectomy in 2016. In 2018 he was admitted with hyperkalemia and AKI that responded to hydration.   LE vascular dopplers done with [facility] in January showed ...  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 He was treated with IV antibiotics. He had been given vitamin K and advised to hold his Coumadin with plans to start Eliquis once INR trended down to 2.  He had been discharged home on Keflex.   He was readmitted to our hospital on 12/18-12/25/24 with syncope and upper GI bleed. He had black stools.  He was orthostatic and had AKI, hyperkalemia.GI team was consulted to assist with patient's management. Patient underwent EGD on 05/04/2022. EGD revealed polyps of the stomach and a nonbleeding duodenal ulcer. He was transfused 4 units of PRBCs. GI planned relook in 6-8 weeks. He was hydrated with return of renal function to baseline. He was DC on Eliquis. Echo during his stay (study done with high HR in AFib) showed normal EF. Moderate RV enlargement, moderate pulmonary HTN, severe MR and severe biatrial enlargement.  Patient's anticoagulation was restarted. He was on midodrine and Florinef for orthostasis.   On follow up with PCP on March 4 Hgb improved to 9.1 and renal function was  back to baseline. He is still weak. Getting home PT. No further syncope. Denies any dyspnea or increased swelling. Still getting wraps on LE at wound center. Notes he has lost 40 lbs since beginning of year with poor appetite and medical illnesses.  He was seen on April 25 by Eligha Bridegroom NP. Noted increase in LE swelling. Legs still being wrapped at [facility]. Florinef discontinued and continued on lasix 20 mg daily.   Recent Labs: 05/03/2022: ALT 21 05/18/2022: Creatinine, Ser 1.88; Hemoglobin 9.1; Potassium 5.0  Wt Readings from Last 3 Encounters:  05/27/22 213 lb 9.6 oz (96.9 kg)  05/18/22 203 lb 3.2 oz (92.2 kg)  05/03/22 213 lb (96.6 kg)     Past Medical History:  Diagnosis Date   Atrial fibrillation (HCC)    Chronic anticoagulation    Complication of anesthesia    ' hard time waking up" per patient    Diabetes mellitus    type 2   GERD (gastroesophageal reflux disease)    Hyperlipidemia    Hypertension    Mitral regurgitation    a. 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HCC)    chronic atrial fib   Pulmonary hypertension (HCC)    Pulmonary hypertension (HCC)     Current Outpatient Medications  Medication Sig Dispense Refill   acetaminophen (TYLENOL) 500 MG tablet Take 1,000 mg by mouth as  needed for mild pain.     allopurinol (ZYLOPRIM) 100 MG tablet TAKE 1 TABLET BY MOUTH TWICE A DAY 120 tablet 0   apixaban (ELIQUIS) 5 MG TABS tablet Take 1 tablet (5 mg total) by mouth 2 (two) times daily. 60 tablet 0   famotidine (PEPCID) 20 MG tablet TAKE 1 TABLET BY MOUTH DAILY - KEEP APPOINTMENT FOR FURTHER REFILLS (Patient taking differently: Take 20 mg by mouth daily.) 90 tablet 3   feeding supplement (ENSURE ENLIVE / ENSURE PLUS) LIQD Take 237 mLs by mouth 2 (two) times daily between meals. 237 mL 12   glipiZIDE (GLUCOTROL XL) 2.5 MG 24 hr tablet TAKE 1 TABLET BY MOUTH DAILY WITH BREAKFAST 90 tablet 1    Glucose Blood (FREESTYLE LITE TEST VI)      Iron, Ferrous Sulfate, 325 (65 Fe) MG TABS Take 325 mg by mouth daily. 30 tablet 2   lidocaine (LIDODERM) 5 % Place 1 patch onto the skin as needed. Remove & Discard patch within 12 hours or as directed by MD     metoprolol tartrate (LOPRESSOR) 25 MG tablet Take 0.5 tablets (12.5 mg total) by mouth 2 (two) times daily. 30 tablet 0   Multiple Vitamin (MULTIVITAMIN WITH MINERALS) TABS tablet Take 1 tablet by mouth daily. 30 tablet 0   pantoprazole (PROTONIX) 40 MG tablet Take 1 tablet (40 mg total) by mouth daily. 30 tablet 0   predniSONE (DELTASONE) 10 MG tablet Take 0.5 tablets (5 mg total) by mouth daily with breakfast. 15 tablet 1   silver sulfADIAZINE (SILVADENE) 1 % cream Apply topically.     simvastatin (ZOCOR) 20 MG tablet Take 1 tablet (20 mg total) by mouth at bedtime. 90 tablet 0   tamsulosin (FLOMAX) 0.4 MG CAPS capsule Take 0.4 mg by mouth daily.     fludrocortisone (FLORINEF) 0.1 MG tablet Take 1 tablet (0.1 mg total) by mouth daily. 30 tablet 3   midodrine (PROAMATINE) 10 MG tablet Take 1 tablet (10 mg total) by mouth 3 (three) times daily with meals. 90 tablet 3   No current facility-administered medications for this visit.    Allergies:   Atorvastatin and Lisinopril   Social History:  The patient  reports that he has never smoked. He has never used smokeless tobacco. He reports that he does not currently use alcohol. He reports that he does not use drugs.   Family History:  The patient's family history includes Alzheimer's disease in his mother; Diabetes in his brother and mother; Heart disease in his father; Hyperlipidemia in his brother; Hypertension in his mother.   ROS:  Please see the history of present illness.      All other systems reviewed and negative.   PHYSICAL EXAM: VS:  BP 106/60   Pulse (!) 59   Ht 6\' 3"  (1.905 m)   Wt 213 lb 9.6 oz (96.9 kg)   SpO2 99%   BMI 26.70 kg/m  GENERAL:  Well appearing WM in  NAD HEENT:  PERRL, EOMI, sclera are clear. Oropharynx is clear. NECK:  No jugular venous distention, carotid upstroke brisk and symmetric, no bruits, no thyromegaly or adenopathy Back is kyphotic.  HEART:  IRRR,  PMI not displaced or sustained,S1 and S2 within normal limits, no S3, no S4: no clicks, no rubs, 2/6 systolic systolic murmur at the apex ABD:  Soft, nontender. BS +, no masses or bruits. No hepatomegaly, no splenomegaly EXT:  2 legs completely wrapped.  SKIN:  Warm and dry.  No  rashes NEURO:  Alert and oriented x 3. Cranial nerves II through XII intact. PSYCH:  Cognitively intact    Laboratory data: Lab Results  Component Value Date   WBC 9.8 05/18/2022   HGB 9.1 (L) 05/18/2022   HCT 29.1 (L) 05/18/2022   PLT 270 05/18/2022   GLUCOSE 111 (H) 05/18/2022   ALT 21 05/03/2022   AST 20 05/03/2022   NA 145 (H) 05/18/2022   K 5.0 05/18/2022   CL 106 05/18/2022   CREATININE 1.88 (H) 05/18/2022   BUN 32 (H) 05/18/2022   CO2 21 05/18/2022   INR 1.4 (H) 05/05/2022   HGBA1C 6.7 (H) 03/24/2022   MICROALBUR 150 01/23/2021   Dated 10/19/17: cholesterol 145, triglycerides 125, HDL 38, LDL 82. A1c 7.5%. Hgb 13.1. creatinine 1.71. other chemistries and TSH normal.  EKG:  Today- AFib, HR 64. No acute ST changes. I have personally reviewed and interpreted this study.  Echo 12/24/14: Study Conclusions   - Left ventricle: The cavity size was mildly dilated. Wall   thickness was normal. Systolic function was normal. The estimated   ejection fraction was in the range of 60% to 65%. - Mitral valve: There was moderate regurgitation. - Left atrium: The atrium was severely dilated. - Right ventricle: The cavity size was moderately dilated. Wall   thickness was normal. - Right atrium: The atrium was moderately dilated  Echo 05/31/20: IMPRESSIONS     1. Left ventricular ejection fraction, by estimation, is 60 to 65%. The  left ventricle has normal function. The left ventricle has no  regional  wall motion abnormalities. There is mild left ventricular hypertrophy.  Left ventricular diastolic function  could not be evaluated.   2. Right ventricular systolic function is normal. The right ventricular  size is normal. There is severely elevated pulmonary artery systolic  pressure. The estimated right ventricular systolic pressure is 66.0 mmHg.   3. Left atrial size was severely dilated.   4. Right atrial size was moderately dilated.   5. The mitral valve is abnormal. Moderate to severe mitral valve  regurgitation.   6. Eccentric TR jet. The tricuspid valve is abnormal. Tricuspid valve  regurgitation is moderate.   7. The aortic valve is tricuspid. Aortic valve regurgitation is trivial.   8. The inferior vena cava is dilated in size with <50% respiratory  variability, suggesting right atrial pressure of 15 mmHg.   Comparison(s): Changes from prior study are noted. 05/31/19 EF 60-65%.  Moderate-severe MR, RVSP 50 mmHg.   Echo 05/05/22: IMPRESSIONS     1. Left ventricular ejection fraction, by estimation, is 70 to 75%. The  left ventricle has hyperdynamic function. The left ventricle has no  regional wall motion abnormalities. Left ventricular diastolic parameters  are indeterminate.   2. Right ventricular systolic function is normal. The right ventricular  size is moderately enlarged. There is moderately elevated pulmonary artery  systolic pressure. The estimated right ventricular systolic pressure is  57.3 mmHg.   3. Left atrial size was massively dilated.   4. Right atrial size was severely dilated.   5. The mitral valve is normal in structure. Severe mitral valve  regurgitation.   6. Tricuspid valve regurgitation is severe.   7. The aortic valve is tricuspid. Aortic valve regurgitation is trivial.   8. Pulmonic valve regurgitation is moderate.   9. The inferior vena cava is normal in size with <50% respiratory  variability, suggesting right atrial pressure of 8  mmHg.   Comparison(s): LV is more  vigorous from prior, imaged at 140 bpm + during  this study.   ASSESSMENT AND PLAN:  Atrial Fibrillation- chronic and permanent:  Rate is well controlled on lower dose of metoprolol now.  Now on Eliquis since Coumadin difficult to regulate.  Mitral Regurgitation: moderate- severe. I personally reviewed his recent Echo. 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 He has had AFib since 1970s. His LV function is good. He is not really symptomatic. The pulmonary HTN noted on Echo has been there at least 20 years.Will continue current therapy. Currently on no diuretics.  Hypertension:  by history. Now with orthostatic hypotension. On florinef and midodrine. Hopefully this will improve with normalization of anemia. Hyperlipidemia:  Continue statin. Reports lab done at [facility] within the last year. Anemia due to acute blood loss. Recommend he add Fe supplement 325 mg daily CKD stage 3b. Followed by Nephrology. Duodenal ulcer. Per GI.   I will follow up in 3 months.   Signed, Afsana Liera [place] MD, [facility]    05/27/2022 9:49 AM

## 2022-08-17 ENCOUNTER — Encounter: Payer: Self-pay | Admitting: Cardiology

## 2022-08-17 ENCOUNTER — Ambulatory Visit: Payer: Medicare Other | Attending: Cardiology | Admitting: Cardiology

## 2022-08-17 VITALS — BP 126/74 | HR 84 | Ht 75.0 in | Wt 210.4 lb

## 2022-08-17 DIAGNOSIS — I951 Orthostatic hypotension: Secondary | ICD-10-CM | POA: Diagnosis not present

## 2022-08-17 DIAGNOSIS — Z7901 Long term (current) use of anticoagulants: Secondary | ICD-10-CM | POA: Insufficient documentation

## 2022-08-17 DIAGNOSIS — M109 Gout, unspecified: Secondary | ICD-10-CM | POA: Diagnosis not present

## 2022-08-17 DIAGNOSIS — I872 Venous insufficiency (chronic) (peripheral): Secondary | ICD-10-CM | POA: Diagnosis not present

## 2022-08-17 DIAGNOSIS — L89899 Pressure ulcer of other site, unspecified stage: Secondary | ICD-10-CM | POA: Diagnosis not present

## 2022-08-17 DIAGNOSIS — I4821 Permanent atrial fibrillation: Secondary | ICD-10-CM | POA: Insufficient documentation

## 2022-08-17 DIAGNOSIS — E785 Hyperlipidemia, unspecified: Secondary | ICD-10-CM | POA: Diagnosis not present

## 2022-08-17 DIAGNOSIS — E11621 Type 2 diabetes mellitus with foot ulcer: Secondary | ICD-10-CM | POA: Diagnosis not present

## 2022-08-17 DIAGNOSIS — M310 Hypersensitivity angiitis: Secondary | ICD-10-CM | POA: Diagnosis not present

## 2022-08-17 DIAGNOSIS — I340 Nonrheumatic mitral (valve) insufficiency: Secondary | ICD-10-CM | POA: Diagnosis not present

## 2022-08-17 DIAGNOSIS — R600 Localized edema: Secondary | ICD-10-CM

## 2022-08-17 DIAGNOSIS — L97221 Non-pressure chronic ulcer of left calf limited to breakdown of skin: Secondary | ICD-10-CM | POA: Diagnosis not present

## 2022-08-17 DIAGNOSIS — L97512 Non-pressure chronic ulcer of other part of right foot with fat layer exposed: Secondary | ICD-10-CM | POA: Diagnosis not present

## 2022-08-17 DIAGNOSIS — I10 Essential (primary) hypertension: Secondary | ICD-10-CM | POA: Diagnosis not present

## 2022-08-17 DIAGNOSIS — Z7984 Long term (current) use of oral hypoglycemic drugs: Secondary | ICD-10-CM | POA: Diagnosis not present

## 2022-08-17 DIAGNOSIS — I4891 Unspecified atrial fibrillation: Secondary | ICD-10-CM | POA: Diagnosis not present

## 2022-08-17 DIAGNOSIS — N1832 Chronic kidney disease, stage 3b: Secondary | ICD-10-CM | POA: Insufficient documentation

## 2022-08-17 DIAGNOSIS — L84 Corns and callosities: Secondary | ICD-10-CM | POA: Diagnosis not present

## 2022-08-17 DIAGNOSIS — L97222 Non-pressure chronic ulcer of left calf with fat layer exposed: Secondary | ICD-10-CM | POA: Diagnosis not present

## 2022-08-17 MED ORDER — MIDODRINE HCL 10 MG PO TABS: 10.0000 mg | ORAL_TABLET | Freq: Every day | ORAL | 3 refills | Status: DC

## 2022-08-17 NOTE — Patient Instructions (Signed)
Medication Instructions:  Continue same medications *If you need a refill on your cardiac medications before your next appointment, please call your pharmacy*   Lab Work: None ordered   Testing/Procedures: None ordered   Follow-Up: At [facility], you and your health needs are our priority.  As part of our continuing mission to provide you with exceptional heart care, we have created designated Provider Care Teams.  These Care Teams include your primary Cardiologist (physician) and Advanced Practice Providers (APPs -  Physician Assistants and Nurse Practitioners) who all work together to provide you with the care you need, when you need it.  We recommend signing up for the patient portal called "MyChart".  Sign up information is provided on this After Visit Summary.  MyChart is used to connect with patients for Virtual Visits (Telemedicine).  Patients are able to view lab/test results, encounter notes, upcoming appointments, etc.  Non-urgent messages can be sent to your provider as well.   To learn more about what you can do with MyChart, go to [email or web address].    Your next appointment:  4 months    Provider:  Dr.Jordan   

## 2022-08-18 ENCOUNTER — Telehealth: Payer: Self-pay | Admitting: *Deleted

## 2022-08-18 DIAGNOSIS — L97512 Non-pressure chronic ulcer of other part of right foot with fat layer exposed: Secondary | ICD-10-CM | POA: Diagnosis not present

## 2022-08-18 DIAGNOSIS — B351 Tinea unguium: Secondary | ICD-10-CM | POA: Diagnosis not present

## 2022-08-18 NOTE — Telephone Encounter (Signed)
Please advise holding Eliquis prior to C6-7 ESI.  Thank you!  DW

## 2022-08-18 NOTE — Telephone Encounter (Signed)
Dr. Sw[place],  You saw this patient on 08/17/2022. Will you please comment on medical clearance for C6-7 ESI?  Please route your response to P CV DIV Preop. I will communicate with requesting office once you have given recommendations.   Thank you!  Carlos Levering, NP

## 2022-08-18 NOTE — Telephone Encounter (Signed)
   Name: [name]  DOB: 05-07-1945  MRN: [id number]   Primary Cardiologist: Peter [place], MD  Chart reviewed as part of pre-operative protocol coverage. GLOVER DIBERT was last seen on 08/17/2022 by Dr. [place].  Per Dr. Swaziland "okay to hold Eliquis 72 hours for procedure."  Therefore, based on ACC/AHA guidelines, the patient would be at acceptable risk for the planned procedure without further cardiovascular testing.   Per Pharm.D.: Patient with diagnosis of afib on Eliquis for anticoagulation.     Procedure: C6-7 ESI Date of procedure: TBD   CHA2DS2-VASc Score = 4  This indicates a 4.8% annual risk of stroke. The patient's score is based upon: CHF History: 0 HTN History: 1 Diabetes History: 1 Stroke History: 0 Vascular Disease History: 0 Age Score: 2 Gender Score: 0   CrCl 61mL/min Platelet count 203K   Per office protocol, patient can hold Eliquis for 3 days prior to procedure.     I will route this recommendation to the requesting party via Epic fax function and remove from pre-op pool. Please call with questions.  Carlos Levering, NP 08/18/2022, 2:11 PM

## 2022-08-18 NOTE — Telephone Encounter (Signed)
Patient with diagnosis of afib on Eliquis for anticoagulation.    Procedure: C6-7 ESI Date of procedure: TBD  CHA2DS2-VASc Score = 4  This indicates a 4.8% annual risk of stroke. The patient's score is based upon: CHF History: 0 HTN History: 1 Diabetes History: 1 Stroke History: 0 Vascular Disease History: 0 Age Score: 2 Gender Score: 0  CrCl 30mL/min Platelet count 203K  Per office protocol, patient can hold Eliquis for 3 days prior to procedure.    **This guidance is not considered finalized until pre-operative APP has relayed final recommendations.**

## 2022-08-18 NOTE — Telephone Encounter (Signed)
   Pre-operative Risk Assessment    Patient Name: [name]  DOB: [birth year] MRN: [id number]      Request for Surgical Clearance    Procedure:   C6-C7 ESI3  Date of Surgery:  Clearance TBD                                 Surgeon:  Dr. Aileen Fass Surgeon's Group or Practice Name:  [facility] Phone number:  [phone] Fax number:  [phone]   Type of Clearance Requested:   - Medical  - Pharmacy:  Hold Apixaban (Eliquis) 3 days prior.   Type of Anesthesia:  Not Indicated   Additional requests/questions:  Patient had appointment yesterday, June 3 with Dr. Thomasene Lot.   Signed, Emmit Pomfret   08/18/2022, 7:41 AM

## 2022-08-19 DIAGNOSIS — L97519 Non-pressure chronic ulcer of other part of right foot with unspecified severity: Secondary | ICD-10-CM | POA: Insufficient documentation

## 2022-08-19 DIAGNOSIS — I872 Venous insufficiency (chronic) (peripheral): Secondary | ICD-10-CM | POA: Diagnosis not present

## 2022-08-19 DIAGNOSIS — E1151 Type 2 diabetes mellitus with diabetic peripheral angiopathy without gangrene: Secondary | ICD-10-CM | POA: Diagnosis not present

## 2022-08-19 DIAGNOSIS — E11621 Type 2 diabetes mellitus with foot ulcer: Secondary | ICD-10-CM | POA: Diagnosis not present

## 2022-08-19 DIAGNOSIS — I70202 Unspecified atherosclerosis of native arteries of extremities, left leg: Secondary | ICD-10-CM | POA: Diagnosis not present

## 2022-08-19 DIAGNOSIS — L97421 Non-pressure chronic ulcer of left heel and midfoot limited to breakdown of skin: Secondary | ICD-10-CM | POA: Diagnosis not present

## 2022-08-21 DIAGNOSIS — L97519 Non-pressure chronic ulcer of other part of right foot with unspecified severity: Secondary | ICD-10-CM | POA: Diagnosis not present

## 2022-08-21 DIAGNOSIS — E1151 Type 2 diabetes mellitus with diabetic peripheral angiopathy without gangrene: Secondary | ICD-10-CM | POA: Diagnosis not present

## 2022-08-21 DIAGNOSIS — E11621 Type 2 diabetes mellitus with foot ulcer: Secondary | ICD-10-CM | POA: Diagnosis not present

## 2022-08-21 DIAGNOSIS — L97421 Non-pressure chronic ulcer of left heel and midfoot limited to breakdown of skin: Secondary | ICD-10-CM | POA: Diagnosis not present

## 2022-08-21 DIAGNOSIS — I872 Venous insufficiency (chronic) (peripheral): Secondary | ICD-10-CM | POA: Diagnosis not present

## 2022-08-21 DIAGNOSIS — I70202 Unspecified atherosclerosis of native arteries of extremities, left leg: Secondary | ICD-10-CM | POA: Diagnosis not present

## 2022-08-24 ENCOUNTER — Encounter (HOSPITAL_BASED_OUTPATIENT_CLINIC_OR_DEPARTMENT_OTHER): Payer: Self-pay | Admitting: Family Medicine

## 2022-08-24 ENCOUNTER — Ambulatory Visit (INDEPENDENT_AMBULATORY_CARE_PROVIDER_SITE_OTHER): Payer: Medicare Other | Admitting: Family Medicine

## 2022-08-24 ENCOUNTER — Ambulatory Visit (INDEPENDENT_AMBULATORY_CARE_PROVIDER_SITE_OTHER): Payer: Medicare Other

## 2022-08-24 VITALS — BP 137/89 | HR 98 | Ht 75.0 in | Wt 206.4 lb

## 2022-08-24 DIAGNOSIS — R634 Abnormal weight loss: Secondary | ICD-10-CM

## 2022-08-24 DIAGNOSIS — R059 Cough, unspecified: Secondary | ICD-10-CM | POA: Diagnosis not present

## 2022-08-24 NOTE — Patient Instructions (Signed)
  Medication Instructions:  Your physician recommends that you continue on your current medications as directed. Please refer to the Current Medication list given to you today. --If you need a refill on any your medications before your next appointment, please call your pharmacy first. If no refills are authorized on file call the office.-- Lab Work: Your physician has recommended that you have lab work today: No If you have labs (blood work) drawn today and your tests are completely normal, you will receive your results via MyChart message OR a phone call from our staff.  Please ensure you check your voicemail in the event that you authorized detailed messages to be left on a delegated number. If you have any lab test that is abnormal or we need to change your treatment, we will call you to review the results.  Referrals/Procedures/Imaging: Yes  Follow-Up: Your next appointment:   Your physician recommends that you schedule a follow-up appointment as needed with Dr. de [place].  You will receive a text message or e-mail with a link to a survey about your care and experience with us today! We would greatly appreciate your feedback!   Thanks for letting us be apart of your health journey!!  Primary Care and Sports Medicine   Dr. Raymond de Cuba   We encourage you to activate your patient portal called "MyChart".  Sign up information is provided on this After Visit Summary.  MyChart is used to connect with patients for Virtual Visits (Telemedicine).  Patients are able to view lab/test results, encounter notes, upcoming appointments, etc.  Non-urgent messages can be sent to your provider as well. To learn more about what you can do with MyChart, please visit --  [email or web address].    

## 2022-08-24 NOTE — Progress Notes (Signed)
    Procedures performed today:    None.  Independent interpretation of notes and tests performed by another provider:   None.  Brief History, Exam, Impression, and Recommendations:    BP 137/89 (BP Location: Left Arm, Patient Position: Sitting, Cuff Size: Normal)   Pulse 98   Ht 6\' 3"  (1.905 m)   Wt 206 lb 6.4 oz (93.6 kg)   SpO2 99% Comment: RA  BMI 25.80 kg/m   Weight loss Patient accompanied to clinic today by his wife.  They report primary concern of ongoing weight loss for patient.  In our office today, patient weighs 206 pounds, prior in office readings with US show March 203 pounds, January 230 pounds, October 231 pounds.  Patient and wife are noting according to their scale at home, patient has lost about 10 pounds in 1 week.  At present, patient does report cough, this is more noticeable at night when trying to lay down to sleep.  Mild shortness of breath with this, occasionally sleeping with head elevated. On exam, patient is in no acute distress, vital signs are stable with normal blood pressure, slightly elevated heart rate, within normal range however.  Cardiovascular exam with irregular rhythm, lungs clear to auscultation bilaterally. Discussed general considerations related to recent symptoms and reported weight loss.  Has had more recent weight variability due to uncertain cause.  He previously was taking Lasix, not taking at present, thus would make notable fluid loss less likely.  Since our last office visit, weight does seem to be stable, however they are reporting that on measuring at home they have noted more appreciable drop in weight over the past several days.  They are not aware of any significant changes during that time.  Did discuss general considerations related to rapid short-term weight loss such as this and there is not an obvious but they have reportedly observed Given current symptoms, would be reasonable to proceed with labs as well as chest x-ray at this  time to ensure no acute cardiopulmonary abnormalities.  Will continue with current medication regimen, no changes today.  Recommend continued follow-up with specialist.  Recommend continuing to monitor weight closely.  Return if symptoms worsen or fail to improve.   ___________________________________________  [place], MD, ABFM, [facility] Primary Care and Sports Medicine [facility]

## 2022-08-25 LAB — CBC WITH DIFFERENTIAL/PLATELET
Basophils Absolute: 0.1 10*3/uL (ref 0.0–0.2)
Basos: 1 %
EOS (ABSOLUTE): 0.2 10*3/uL (ref 0.0–0.4)
Eos: 3 %
Hematocrit: 31.8 % — ABNORMAL LOW (ref 37.5–51.0)
Hemoglobin: 9.8 g/dL — ABNORMAL LOW (ref 13.0–17.7)
Immature Grans (Abs): 0 10*3/uL (ref 0.0–0.1)
Immature Granulocytes: 0 %
Lymphocytes Absolute: 0.9 10*3/uL (ref 0.7–3.1)
Lymphs: 12 %
MCH: 26.9 pg (ref 26.6–33.0)
MCHC: 30.8 g/dL — ABNORMAL LOW (ref 31.5–35.7)
MCV: 87 fL (ref 79–97)
Monocytes Absolute: 1 10*3/uL — ABNORMAL HIGH (ref 0.1–0.9)
Monocytes: 14 %
Neutrophils Absolute: 5 10*3/uL (ref 1.4–7.0)
Neutrophils: 70 %
Platelets: 295 10*3/uL (ref 150–450)
RBC: 3.64 x10E6/uL — ABNORMAL LOW (ref 4.14–5.80)
RDW: 14.2 % (ref 11.6–15.4)
WBC: 7.2 10*3/uL (ref 3.4–10.8)

## 2022-08-25 LAB — COMPREHENSIVE METABOLIC PANEL
ALT: 20 IU/L (ref 0–44)
AST: 36 IU/L (ref 0–40)
Albumin/Globulin Ratio: 1.3
Albumin: 3.8 g/dL (ref 3.8–4.8)
Alkaline Phosphatase: 177 IU/L — ABNORMAL HIGH (ref 44–121)
BUN/Creatinine Ratio: 22 (ref 10–24)
BUN: 31 mg/dL — ABNORMAL HIGH (ref 8–27)
Bilirubin Total: 0.5 mg/dL (ref 0.0–1.2)
CO2: 24 mmol/L (ref 20–29)
Calcium: 9 mg/dL (ref 8.6–10.2)
Chloride: 105 mmol/L (ref 96–106)
Creatinine, Ser: 1.42 mg/dL — ABNORMAL HIGH (ref 0.76–1.27)
Globulin, Total: 2.9 g/dL (ref 1.5–4.5)
Glucose: 123 mg/dL — ABNORMAL HIGH (ref 70–99)
Potassium: 4.5 mmol/L (ref 3.5–5.2)
Sodium: 144 mmol/L (ref 134–144)
Total Protein: 6.7 g/dL (ref 6.0–8.5)
eGFR: 52 mL/min/{1.73_m2} — ABNORMAL LOW (ref >59)

## 2022-08-25 LAB — TSH RFX ON ABNORMAL TO FREE T4: TSH: 1.99 u[IU]/mL (ref 0.450–4.500)

## 2022-08-26 DIAGNOSIS — L97421 Non-pressure chronic ulcer of left heel and midfoot limited to breakdown of skin: Secondary | ICD-10-CM | POA: Diagnosis not present

## 2022-08-26 DIAGNOSIS — I70202 Unspecified atherosclerosis of native arteries of extremities, left leg: Secondary | ICD-10-CM | POA: Diagnosis not present

## 2022-08-26 DIAGNOSIS — I872 Venous insufficiency (chronic) (peripheral): Secondary | ICD-10-CM | POA: Diagnosis not present

## 2022-08-26 DIAGNOSIS — E1151 Type 2 diabetes mellitus with diabetic peripheral angiopathy without gangrene: Secondary | ICD-10-CM | POA: Diagnosis not present

## 2022-08-26 DIAGNOSIS — L97519 Non-pressure chronic ulcer of other part of right foot with unspecified severity: Secondary | ICD-10-CM | POA: Diagnosis not present

## 2022-08-26 DIAGNOSIS — E11621 Type 2 diabetes mellitus with foot ulcer: Secondary | ICD-10-CM | POA: Diagnosis not present

## 2022-08-27 ENCOUNTER — Other Ambulatory Visit (HOSPITAL_COMMUNITY): Payer: Self-pay | Admitting: *Deleted

## 2022-08-28 DIAGNOSIS — L97519 Non-pressure chronic ulcer of other part of right foot with unspecified severity: Secondary | ICD-10-CM | POA: Diagnosis not present

## 2022-08-28 DIAGNOSIS — E1151 Type 2 diabetes mellitus with diabetic peripheral angiopathy without gangrene: Secondary | ICD-10-CM | POA: Diagnosis not present

## 2022-08-28 DIAGNOSIS — I70202 Unspecified atherosclerosis of native arteries of extremities, left leg: Secondary | ICD-10-CM | POA: Diagnosis not present

## 2022-08-28 DIAGNOSIS — L97421 Non-pressure chronic ulcer of left heel and midfoot limited to breakdown of skin: Secondary | ICD-10-CM | POA: Diagnosis not present

## 2022-08-28 DIAGNOSIS — I872 Venous insufficiency (chronic) (peripheral): Secondary | ICD-10-CM | POA: Diagnosis not present

## 2022-08-28 DIAGNOSIS — E11621 Type 2 diabetes mellitus with foot ulcer: Secondary | ICD-10-CM | POA: Diagnosis not present

## 2022-08-31 ENCOUNTER — Encounter (HOSPITAL_COMMUNITY)
Admission: RE | Admit: 2022-08-31 | Discharge: 2022-08-31 | Disposition: A | Discharge status: Home / Self Care | Payer: Medicare Other | Source: Ambulatory Visit | Attending: Nephrology | Admitting: Nephrology

## 2022-08-31 DIAGNOSIS — N189 Chronic kidney disease, unspecified: Secondary | ICD-10-CM | POA: Insufficient documentation

## 2022-08-31 DIAGNOSIS — L97512 Non-pressure chronic ulcer of other part of right foot with fat layer exposed: Secondary | ICD-10-CM | POA: Diagnosis not present

## 2022-08-31 DIAGNOSIS — E11621 Type 2 diabetes mellitus with foot ulcer: Secondary | ICD-10-CM | POA: Diagnosis not present

## 2022-08-31 DIAGNOSIS — M310 Hypersensitivity angiitis: Secondary | ICD-10-CM | POA: Diagnosis not present

## 2022-08-31 DIAGNOSIS — D631 Anemia in chronic kidney disease: Secondary | ICD-10-CM | POA: Diagnosis not present

## 2022-08-31 DIAGNOSIS — L89899 Pressure ulcer of other site, unspecified stage: Secondary | ICD-10-CM | POA: Diagnosis not present

## 2022-08-31 DIAGNOSIS — L97222 Non-pressure chronic ulcer of left calf with fat layer exposed: Secondary | ICD-10-CM | POA: Diagnosis not present

## 2022-08-31 DIAGNOSIS — I872 Venous insufficiency (chronic) (peripheral): Secondary | ICD-10-CM | POA: Diagnosis not present

## 2022-08-31 MED ORDER — SODIUM CHLORIDE 0.9 % IV SOLN
510.0000 mg | INTRAVENOUS | Status: DC
  Administered 2022-08-31: 510 mg via INTRAVENOUS
  Filled 2022-08-31: qty 510

## 2022-09-02 DIAGNOSIS — E11621 Type 2 diabetes mellitus with foot ulcer: Secondary | ICD-10-CM | POA: Diagnosis not present

## 2022-09-02 DIAGNOSIS — E1151 Type 2 diabetes mellitus with diabetic peripheral angiopathy without gangrene: Secondary | ICD-10-CM | POA: Diagnosis not present

## 2022-09-02 DIAGNOSIS — I872 Venous insufficiency (chronic) (peripheral): Secondary | ICD-10-CM | POA: Diagnosis not present

## 2022-09-02 DIAGNOSIS — L97519 Non-pressure chronic ulcer of other part of right foot with unspecified severity: Secondary | ICD-10-CM | POA: Diagnosis not present

## 2022-09-02 DIAGNOSIS — I70202 Unspecified atherosclerosis of native arteries of extremities, left leg: Secondary | ICD-10-CM | POA: Diagnosis not present

## 2022-09-02 DIAGNOSIS — L97421 Non-pressure chronic ulcer of left heel and midfoot limited to breakdown of skin: Secondary | ICD-10-CM | POA: Diagnosis not present

## 2022-09-03 ENCOUNTER — Telehealth (HOSPITAL_BASED_OUTPATIENT_CLINIC_OR_DEPARTMENT_OTHER): Payer: Self-pay | Admitting: Family Medicine

## 2022-09-03 NOTE — Telephone Encounter (Signed)
Pt has a productive cough--xray last week was clear  Please call to discuss

## 2022-09-04 DIAGNOSIS — L97519 Non-pressure chronic ulcer of other part of right foot with unspecified severity: Secondary | ICD-10-CM | POA: Diagnosis not present

## 2022-09-04 DIAGNOSIS — E1151 Type 2 diabetes mellitus with diabetic peripheral angiopathy without gangrene: Secondary | ICD-10-CM | POA: Diagnosis not present

## 2022-09-04 DIAGNOSIS — I872 Venous insufficiency (chronic) (peripheral): Secondary | ICD-10-CM | POA: Diagnosis not present

## 2022-09-04 DIAGNOSIS — I70202 Unspecified atherosclerosis of native arteries of extremities, left leg: Secondary | ICD-10-CM | POA: Diagnosis not present

## 2022-09-04 DIAGNOSIS — L97421 Non-pressure chronic ulcer of left heel and midfoot limited to breakdown of skin: Secondary | ICD-10-CM | POA: Diagnosis not present

## 2022-09-04 DIAGNOSIS — E11621 Type 2 diabetes mellitus with foot ulcer: Secondary | ICD-10-CM | POA: Diagnosis not present

## 2022-09-07 ENCOUNTER — Ambulatory Visit (HOSPITAL_COMMUNITY)
Admission: RE | Admit: 2022-09-07 | Discharge: 2022-09-07 | Disposition: A | Discharge status: Home / Self Care | Payer: Medicare Other | Source: Ambulatory Visit | Attending: Nephrology | Admitting: Nephrology

## 2022-09-07 DIAGNOSIS — D631 Anemia in chronic kidney disease: Secondary | ICD-10-CM | POA: Diagnosis not present

## 2022-09-07 DIAGNOSIS — L89899 Pressure ulcer of other site, unspecified stage: Secondary | ICD-10-CM | POA: Diagnosis not present

## 2022-09-07 DIAGNOSIS — I872 Venous insufficiency (chronic) (peripheral): Secondary | ICD-10-CM | POA: Diagnosis not present

## 2022-09-07 DIAGNOSIS — L97512 Non-pressure chronic ulcer of other part of right foot with fat layer exposed: Secondary | ICD-10-CM | POA: Diagnosis not present

## 2022-09-07 DIAGNOSIS — E11621 Type 2 diabetes mellitus with foot ulcer: Secondary | ICD-10-CM | POA: Diagnosis not present

## 2022-09-07 DIAGNOSIS — M310 Hypersensitivity angiitis: Secondary | ICD-10-CM | POA: Diagnosis not present

## 2022-09-07 DIAGNOSIS — L97222 Non-pressure chronic ulcer of left calf with fat layer exposed: Secondary | ICD-10-CM | POA: Diagnosis not present

## 2022-09-07 DIAGNOSIS — N189 Chronic kidney disease, unspecified: Secondary | ICD-10-CM | POA: Insufficient documentation

## 2022-09-07 DIAGNOSIS — R634 Abnormal weight loss: Secondary | ICD-10-CM | POA: Diagnosis not present

## 2022-09-07 MED ORDER — SODIUM CHLORIDE 0.9 % IV SOLN
510.0000 mg | INTRAVENOUS | Status: DC
  Administered 2022-09-07: 510 mg via INTRAVENOUS
  Filled 2022-09-07: qty 510

## 2022-09-08 DIAGNOSIS — I70202 Unspecified atherosclerosis of native arteries of extremities, left leg: Secondary | ICD-10-CM | POA: Diagnosis not present

## 2022-09-08 DIAGNOSIS — I872 Venous insufficiency (chronic) (peripheral): Secondary | ICD-10-CM | POA: Diagnosis not present

## 2022-09-08 DIAGNOSIS — L97421 Non-pressure chronic ulcer of left heel and midfoot limited to breakdown of skin: Secondary | ICD-10-CM | POA: Diagnosis not present

## 2022-09-08 DIAGNOSIS — E11621 Type 2 diabetes mellitus with foot ulcer: Secondary | ICD-10-CM | POA: Diagnosis not present

## 2022-09-08 DIAGNOSIS — L97519 Non-pressure chronic ulcer of other part of right foot with unspecified severity: Secondary | ICD-10-CM | POA: Diagnosis not present

## 2022-09-08 DIAGNOSIS — E1151 Type 2 diabetes mellitus with diabetic peripheral angiopathy without gangrene: Secondary | ICD-10-CM | POA: Diagnosis not present

## 2022-09-13 ENCOUNTER — Other Ambulatory Visit (HOSPITAL_BASED_OUTPATIENT_CLINIC_OR_DEPARTMENT_OTHER): Payer: Self-pay | Admitting: Family Medicine

## 2022-09-14 DIAGNOSIS — M5412 Radiculopathy, cervical region: Secondary | ICD-10-CM | POA: Diagnosis not present

## 2022-09-22 ENCOUNTER — Other Ambulatory Visit: Payer: Self-pay | Admitting: Cardiology

## 2022-09-23 DIAGNOSIS — L97512 Non-pressure chronic ulcer of other part of right foot with fat layer exposed: Secondary | ICD-10-CM | POA: Diagnosis not present

## 2022-09-23 DIAGNOSIS — M2031 Hallux varus (acquired), right foot: Secondary | ICD-10-CM | POA: Diagnosis not present

## 2022-09-28 ENCOUNTER — Other Ambulatory Visit: Payer: Self-pay | Admitting: Cardiology

## 2022-09-29 DIAGNOSIS — N189 Chronic kidney disease, unspecified: Secondary | ICD-10-CM | POA: Diagnosis not present

## 2022-10-02 ENCOUNTER — Other Ambulatory Visit: Payer: Self-pay | Admitting: Cardiology

## 2022-10-09 ENCOUNTER — Other Ambulatory Visit (HOSPITAL_COMMUNITY): Payer: Self-pay | Admitting: *Deleted

## 2022-10-13 ENCOUNTER — Encounter (HOSPITAL_COMMUNITY): Payer: Medicare Other

## 2022-10-13 ENCOUNTER — Encounter (HOSPITAL_COMMUNITY): Payer: Self-pay

## 2022-10-17 NOTE — Assessment & Plan Note (Addendum)
Patient accompanied to clinic today by his wife.  They report primary concern of ongoing weight loss for patient.  In our office today, patient weighs 206 pounds, prior in office readings with US show March 203 pounds, January 230 pounds, October 231 pounds.  Patient and wife are noting according to their scale at home, patient has lost about 10 pounds in 1 week.  At present, patient does report cough, this is more noticeable at night when trying to lay down to sleep.  Mild shortness of breath with this, occasionally sleeping with head elevated. On exam, patient is in no acute distress, vital signs are stable with normal blood pressure, slightly elevated heart rate, within normal range however.  Cardiovascular exam with irregular rhythm, lungs clear to auscultation bilaterally. Discussed general considerations related to recent symptoms and reported weight loss.  Has had more recent weight variability due to uncertain cause.  He previously was taking Lasix, not taking at present, thus would make notable fluid loss less likely.  Since our last office visit, weight does seem to be stable, however they are reporting that on measuring at home they have noted more appreciable drop in weight over the past several days.  They are not aware of any significant changes during that time.  Did discuss general considerations related to rapid short-term weight loss such as this and there is not an obvious but they have reportedly observed Given current symptoms, would be reasonable to proceed with labs as well as chest x-ray at this time to ensure no acute cardiopulmonary abnormalities.  Will continue with current medication regimen, no changes today.  Recommend continued follow-up with specialist.  Recommend continuing to monitor weight closely.

## 2022-10-20 ENCOUNTER — Encounter (HOSPITAL_COMMUNITY): Payer: Medicare Other

## 2022-10-20 DIAGNOSIS — L97512 Non-pressure chronic ulcer of other part of right foot with fat layer exposed: Secondary | ICD-10-CM | POA: Diagnosis not present

## 2022-10-20 DIAGNOSIS — I872 Venous insufficiency (chronic) (peripheral): Secondary | ICD-10-CM | POA: Diagnosis not present

## 2022-10-20 DIAGNOSIS — L97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: Secondary | ICD-10-CM | POA: Diagnosis not present

## 2022-10-21 ENCOUNTER — Other Ambulatory Visit (HOSPITAL_BASED_OUTPATIENT_CLINIC_OR_DEPARTMENT_OTHER): Payer: Self-pay | Admitting: Family Medicine

## 2022-10-21 DIAGNOSIS — L97429 Non-pressure chronic ulcer of left heel and midfoot with unspecified severity: Secondary | ICD-10-CM | POA: Insufficient documentation

## 2022-10-26 ENCOUNTER — Other Ambulatory Visit (HOSPITAL_BASED_OUTPATIENT_CLINIC_OR_DEPARTMENT_OTHER): Payer: Self-pay | Admitting: Family Medicine

## 2022-10-26 DIAGNOSIS — N400 Enlarged prostate without lower urinary tract symptoms: Secondary | ICD-10-CM

## 2022-10-29 ENCOUNTER — Other Ambulatory Visit (HOSPITAL_COMMUNITY): Payer: Self-pay | Admitting: *Deleted

## 2022-10-29 DIAGNOSIS — M5412 Radiculopathy, cervical region: Secondary | ICD-10-CM | POA: Diagnosis not present

## 2022-10-30 ENCOUNTER — Encounter (HOSPITAL_COMMUNITY)
Admission: RE | Admit: 2022-10-30 | Discharge: 2022-10-30 | Disposition: A | Discharge status: Home / Self Care | Payer: Medicare Other | Source: Ambulatory Visit | Attending: Nephrology | Admitting: Nephrology

## 2022-10-30 DIAGNOSIS — N189 Chronic kidney disease, unspecified: Secondary | ICD-10-CM | POA: Insufficient documentation

## 2022-10-30 DIAGNOSIS — D631 Anemia in chronic kidney disease: Secondary | ICD-10-CM | POA: Insufficient documentation

## 2022-10-30 MED ORDER — SODIUM CHLORIDE 0.9 % IV SOLN
510.0000 mg | INTRAVENOUS | Status: DC
  Administered 2022-10-30: 510 mg via INTRAVENOUS
  Filled 2022-10-30: qty 510

## 2022-11-06 ENCOUNTER — Encounter (HOSPITAL_COMMUNITY)
Admission: RE | Admit: 2022-11-06 | Discharge: 2022-11-06 | Disposition: A | Discharge status: Home / Self Care | Payer: Medicare Other | Source: Ambulatory Visit | Attending: Nephrology | Admitting: Nephrology

## 2022-11-06 ENCOUNTER — Encounter (HOSPITAL_BASED_OUTPATIENT_CLINIC_OR_DEPARTMENT_OTHER): Payer: No Typology Code available for payment source | Attending: Internal Medicine | Admitting: General Surgery

## 2022-11-06 DIAGNOSIS — I272 Pulmonary hypertension, unspecified: Secondary | ICD-10-CM | POA: Insufficient documentation

## 2022-11-06 DIAGNOSIS — L97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: Secondary | ICD-10-CM | POA: Diagnosis not present

## 2022-11-06 DIAGNOSIS — L97812 Non-pressure chronic ulcer of other part of right lower leg with fat layer exposed: Secondary | ICD-10-CM | POA: Diagnosis not present

## 2022-11-06 DIAGNOSIS — I872 Venous insufficiency (chronic) (peripheral): Secondary | ICD-10-CM | POA: Insufficient documentation

## 2022-11-06 DIAGNOSIS — D631 Anemia in chronic kidney disease: Secondary | ICD-10-CM | POA: Diagnosis not present

## 2022-11-06 DIAGNOSIS — E11621 Type 2 diabetes mellitus with foot ulcer: Secondary | ICD-10-CM | POA: Insufficient documentation

## 2022-11-06 DIAGNOSIS — E11622 Type 2 diabetes mellitus with other skin ulcer: Secondary | ICD-10-CM | POA: Insufficient documentation

## 2022-11-06 DIAGNOSIS — I10 Essential (primary) hypertension: Secondary | ICD-10-CM | POA: Diagnosis not present

## 2022-11-06 DIAGNOSIS — Z833 Family history of diabetes mellitus: Secondary | ICD-10-CM | POA: Diagnosis not present

## 2022-11-06 DIAGNOSIS — N189 Chronic kidney disease, unspecified: Secondary | ICD-10-CM | POA: Diagnosis not present

## 2022-11-06 DIAGNOSIS — L97222 Non-pressure chronic ulcer of left calf with fat layer exposed: Secondary | ICD-10-CM | POA: Insufficient documentation

## 2022-11-06 DIAGNOSIS — L97512 Non-pressure chronic ulcer of other part of right foot with fat layer exposed: Secondary | ICD-10-CM | POA: Insufficient documentation

## 2022-11-06 DIAGNOSIS — I4891 Unspecified atrial fibrillation: Secondary | ICD-10-CM | POA: Insufficient documentation

## 2022-11-06 DIAGNOSIS — L97522 Non-pressure chronic ulcer of other part of left foot with fat layer exposed: Secondary | ICD-10-CM | POA: Diagnosis not present

## 2022-11-06 MED ORDER — SODIUM CHLORIDE 0.9 % IV SOLN
510.0000 mg | INTRAVENOUS | Status: AC
  Administered 2022-11-06: 510 mg via INTRAVENOUS
  Filled 2022-11-06: qty 510

## 2022-11-06 NOTE — Progress Notes (Signed)
MAXTEN, PURI ([id number]) [id number] Page 1 of 4 Visit Report for 11/06/2022 Abuse Risk Norris Details Patient Name: Date of Service: [name] Norris, RO GER W. 11/06/2022 8:00 A M Medical Record Number: [id number] Patient Account Number: [id number] Date of Birth/Sex: Treating RN: Sep 26, 1945 (77 y.o. [name] Primary Care Keyaan Lederman: Sanjuan Dame Other Clinician: Referring Chee Dimon: Treating Josmar Messimer/Extender: Floyce Stakes in Treatment: 0 Abuse Risk Norris Items Answer ABUSE RISK Norris: Has anyone close to you tried to hurt or harm you recentlyo No Do you feel uncomfortable with anyone in your familyo No Has anyone forced you do things that you didnt want to doo No Electronic Signature(s) Signed: 11/06/2022 12:07:50 PM By: Shawn Stall RN, BSN Entered By: Shawn Stall on 11/06/2022 05:29:18 -------------------------------------------------------------------------------- Activities of Daily Living Details Patient Name: Date of Service: [name], RO GER W. 11/06/2022 8:00 A M Medical Record Number: [id number] Patient Account Number: [id number] Date of Birth/Sex: Treating RN: [birth year] (77 y.o. [name] Primary Care [name]: Sanjuan Dame Other Clinician: Referring [name]: Treating [name]/Extender: Floyce Stakes in Treatment: 0 Activities of Daily Living Items Answer Activities of Daily Living (Please select one for each item) Drive Automobile Completely Able T Medications ake Completely Able Use T elephone Completely Able Care for Appearance Completely Able Use T oilet Completely Able Bath / Shower Completely Able Dress Self Completely Able Feed Self Completely Able Walk Completely Able Get In / Out Bed Completely Able Housework Completely Able Prepare Meals Completely Able Handle Money Completely Able Shop for Self Completely Able Electronic  Signature(s) Signed: 11/06/2022 12:07:50 PM By: Shawn Stall RN, BSN Entered By: Shawn Stall on 11/06/2022 05:29:34 [name] ([id number]) [id number] Page 2 of 4 -------------------------------------------------------------------------------- Education Screening Details Patient Name: Date of Service: [name] Norris, RO GER W. 11/06/2022 8:00 A M Medical Record Number: [id number] Patient Account Number: [id number] Date of Birth/Sex: Treating RN: Sep 10, 1945 (77 y.o. [name] Primary Care Pete Merten: Sanjuan Dame Other Clinician: Referring [name]: Treating [name]/Extender: Floyce Stakes in Treatment: 0 Primary Learner Assessed: Patient Learning Preferences/Education Level/Primary Language Learning Preference: Explanation, Demonstration, Printed Material Highest Education Level: College or Above Preferred Language: English Cognitive Barrier Language Barrier: No Translator Needed: No Memory Deficit: No Emotional Barrier: No Cultural/Religious Beliefs Affecting Medical Care: No Physical Barrier Impaired Vision: Yes Glasses Impaired Hearing: No Decreased Hand dexterity: No Knowledge/Comprehension Knowledge Level: High Comprehension Level: High Ability to understand written instructions: High Ability to understand verbal instructions: High Motivation Anxiety Level: Calm Cooperation: Cooperative Education Importance: Acknowledges Need Interest in Health Problems: Asks Questions Perception: Coherent Willingness to Engage in Self-Management High Activities: Readiness to Engage in Self-Management High Activities: Electronic Signature(s) Signed: 11/06/2022 12:07:50 PM By: Shawn Stall RN, BSN Entered By: Shawn Stall on 11/06/2022 05:29:55 -------------------------------------------------------------------------------- Fall Risk Assessment Details Patient Name: Date of Service: [name], RO  GER W. 11/06/2022 8:00 A M Medical Record Number: [id number] Patient Account Number: [id number] Date of Birth/Sex: Treating RN: 01-09-1946 (77 y.o. [name] Primary Care [name]: Sanjuan Dame Other Clinician: Referring [name]: Treating [name]/Extender: Floyce Stakes in Treatment: 0 Fall Risk Assessment Items Have you had 2 or more Troy in the last 12 monthso 0 No [name], [name] ([id number]) [id number] Page 3 of 4 Have you had any fall that resulted in injury in the last 12 monthso 0 No [name]  History of falling - immediate or within 3 months 0 No Secondary diagnosis (Do you have 2 or more medical diagnoseso) 0 No Ambulatory aid None/bed rest/wheelchair/nurse 0 Yes Crutches/cane/walker 0 No Furniture 0 No Intravenous therapy Access/Saline/Heparin Lock 0 No Gait/Transferring Normal/ bed rest/ wheelchair 0 Yes Weak (short steps with or without shuffle, stooped but able to lift head while walking, may seek 0 No support from furniture) Impaired (short steps with shuffle, may have difficulty arising from chair, head down, impaired 0 No balance) Mental Status Oriented to own ability 0 Yes Electronic Signature(s) Signed: 11/06/2022 12:07:50 PM By: Shawn Stall RN, BSN Entered By: Shawn Stall on 11/06/2022 05:30:09 -------------------------------------------------------------------------------- Foot Assessment Details Patient Name: Date of Service: [name] Norris, RO GER W. 11/06/2022 8:00 A M Medical Record Number: [id number] Patient Account Number: [id number] Date of Birth/Sex: Treating RN: Dec 11, 1945 (77 y.o. [name] Primary Care [name]: Sanjuan Dame Other Clinician: Referring [name]: Treating [name]/Extender: Floyce Stakes in Treatment: 0 Foot Assessment Items Site Locations + = Sensation present, - = Sensation absent, C = Callus, U =  Ulcer R = Redness, W = Warmth, M = Maceration, PU = Pre-ulcerative lesion F = Fissure, S = Swelling, D = Dryness Assessment Right: Left: Other Deformity: No No Prior Foot Ulcer: No No Prior Amputation: No No Charcot Joint: No No Ambulatory Status: Ambulatory Without Help GaitDEMONTEZ, [name] ([id number]) [id number] Page 4 of 4 Electronic Signature(s) Signed: 11/06/2022 12:07:50 PM By: Shawn Stall RN, BSN Entered By: Shawn Stall on 11/06/2022 06:02:14 -------------------------------------------------------------------------------- Nutrition Risk Screening Details Patient Name: Date of Service: [name], RO GER W. 11/06/2022 8:00 A M Medical Record Number: [id number] Patient Account Number: [id number] Date of Birth/Sex: Treating RN: [birth year] (77 y.o. [name] Primary Care Francella Barnett: Sanjuan Dame Other Clinician: Referring Djeneba Barsch: Treating Kaizen Ibsen/Extender: Floyce Stakes in Treatment: 0 Height (in): 73 Weight (lbs): 192 Body Mass Index (BMI): 25.3 Nutrition Risk Screening Items Score Screening NUTRITION RISK Norris: I have an illness or condition that made me change the kind and/or amount of food I eat 2 Yes I eat fewer than two meals per day 0 No I eat few fruits and vegetables, or milk products 0 No I have three or more drinks of beer, liquor or wine almost every day 0 No I have tooth or mouth problems that make it hard for me to eat 0 No I don't always have enough money to buy the food I need 0 No I eat alone most of the time 0 No I take three or more different prescribed or over-the-counter drugs a day 1 Yes Without wanting to, I have lost or gained 10 pounds in the last six months 0 No I am not always physically able to shop, cook and/or feed myself 0 No Nutrition Protocols Good Risk Protocol Provide education on elevated blood Moderate Risk Protocol 0 sugars and impact on  wound healing, as applicable High Risk Proctocol Risk Level: Moderate Risk Score: 3 Electronic Signature(s) Signed: 11/06/2022 12:07:50 PM By: Shawn Stall RN, BSN Entered By: Shawn Stall on 11/06/2022 05:30:18

## 2022-11-06 NOTE — Progress Notes (Signed)
JAKAR, LEEDOM ([id number]) [id number] Page 1 of 20 Visit Report for 11/06/2022 Allergy List Details Patient Name: Date of Service: [name] CE, RO GER W. 11/06/2022 8:00 A M Medical Record Number: [id number] Patient Account Number: [id number] Date of Birth/Sex: Treating RN: 07/19/1945 (77 y.o. [name] Primary Care Keirra Zeimet: Sanjuan Dame Other Clinician: Referring Napoleon Monacelli: Treating Damarius Karnes/Extender: Lelan Pons in Treatment: 0 Allergies Active Allergies atorvastatin lisinopril Allergy Notes Electronic Signature(s) Signed: 11/06/2022 12:07:50 PM By: Shawn Stall RN, BSN Entered By: Shawn Stall on 11/06/2022 04:27:17 -------------------------------------------------------------------------------- Arrival Information Details Patient Name: Date of Service: [name] CE, RO GER W. 11/06/2022 8:00 A M Medical Record Number: [id number] Patient Account Number: [id number] Date of Birth/Sex: Treating RN: 1945/09/11 (77 y.o. [name] Primary Care Chanell Nadeau: Sanjuan Dame Other Clinician: Referring Adger Cantera: Treating Tanisia Yokley/Extender: Floyce Stakes in Treatment: 0 Visit Information Patient Arrived: Ambulatory Arrival Time: 08:26 Accompanied By: self Transfer Assistance: None Patient Identification Verified: Yes Secondary Verification Process Completed: Yes Patient Requires Transmission-Based Precautions: No Patient Has Alerts: Yes Patient Alerts: Patient on Blood Thinner Electronic Signature(s) Signed: 11/06/2022 12:07:50 PM By: Shawn Stall RN, BSN Entered By: Shawn Stall on 11/06/2022 05:26:55 [name] ([id number]) [id number] Page 2 of 20 -------------------------------------------------------------------------------- Clinic Level of Care Assessment Details Patient Name: Date of Service: [name] CE, RO GER W. 11/06/2022 8:00 A  M Medical Record Number: [id number] Patient Account Number: [id number] Date of Birth/Sex: Treating RN: Jan 12, 1946 (77 y.o. [name] Primary Care Toshi Ishii: Sanjuan Dame Other Clinician: Referring Letcher Schweikert: Treating Ayren Zumbro/Extender: Floyce Stakes in Treatment: 0 Clinic Level of Care Assessment Items TOOL 1 Quantity Score X- 1 0 Use when EandM and Procedure is performed on INITIAL visit ASSESSMENTS - Nursing Assessment / Reassessment X- 1 20 General Physical Exam (combine w/ comprehensive assessment (listed just below) when performed on new pt. evals) X- 1 25 Comprehensive Assessment (HX, ROS, Risk Assessments, Wounds Hx, etc.) ASSESSMENTS - Wound and Skin Assessment / Reassessment X- 1 10 Dermatologic / Skin Assessment (not related to wound area) ASSESSMENTS - Ostomy and/or Continence Assessment and Care []  - 0 Incontinence Assessment and Management []  - 0 Ostomy Care Assessment and Management (repouching, etc.) PROCESS - Coordination of Care []  - 0 Simple Patient / Family Education for ongoing care X- 1 20 Complex (extensive) Patient / Family Education for ongoing care X- 1 10 Staff obtains Chiropractor, Records, T Results / Process Orders est []  - 0 Staff telephones HHA, Nursing Homes / Clarify orders / etc []  - 0 Routine Transfer to another Facility (non-emergent condition) []  - 0 Routine Hospital Admission (non-emergent condition) X- 1 15 New Admissions / Manufacturing engineer / Ordering NPWT Apligraf, etc. , []  - 0 Emergency Hospital Admission (emergent condition) PROCESS - Special Needs []  - 0 Pediatric / Minor Patient Management []  - 0 Isolation Patient Management []  - 0 Hearing / Language / Visual special needs []  - 0 Assessment of Community assistance (transportation, D/C planning, etc.) []  - 0 Additional assistance / Altered mentation []  - 0 Support Surface(s) Assessment (bed, cushion, seat, etc.) INTERVENTIONS  - Miscellaneous []  - 0 External ear exam []  - 0 Patient Transfer (multiple staff / Nurse, adult / Similar devices) []  - 0 Simple Staple / Suture removal (25 or less) []  - 0 Complex Staple / Suture removal (26 or more) []  - 0 Hypo/Hyperglycemic Management (do not check if billed separately) X- 1  15 Ankle / Brachial Index (ABI) - do not check if billed separately Has the patient been seen at the hospital within the last three years: Yes Total Score: 115 Level Of Care: New/Established - Level 3 Electronic Signature(s) Signed: 11/06/2022 12:07:50 PM By: Shawn Stall RN, BSN Entered By: Shawn Stall on 11/06/2022 06:31:56 [name] ([id number]) [id number] Page 3 of 20 -------------------------------------------------------------------------------- Compression Therapy Details Patient Name: Date of Service: [name] CE, RO GER W. 11/06/2022 8:00 A M Medical Record Number: [id number] Patient Account Number: [id number] Date of Birth/Sex: Treating RN: 12-Feb-1946 (77 y.o. [name] Primary Care Lonya Johannesen: Sanjuan Dame Other Clinician: Referring Jayce Kainz: Treating Lucky Alverson/Extender: Floyce Stakes in Treatment: 0 Compression Therapy Performed for Wound Assessment: Wound #1 Left,Posterior Lower Leg Performed By: Clinician Shawn Stall, RN Compression Type: Double Layer Post Procedure Diagnosis Same as Pre-procedure Electronic Signature(s) Signed: 11/06/2022 12:07:50 PM By: Shawn Stall RN, BSN Entered By: Shawn Stall on 11/06/2022 06:28:32 -------------------------------------------------------------------------------- Compression Therapy Details Patient Name: Date of Service: [name] CE, RO GER W. 11/06/2022 8:00 A M Medical Record Number: [id number] Patient Account Number: [id number] Date of Birth/Sex: Treating RN: Aug 31, 1945 (77 y.o. [name] Primary Care Muneer Leider: Sanjuan Dame Other  Clinician: Referring Khloee Garza: Treating Vanshika Jastrzebski/Extender: Floyce Stakes in Treatment: 0 Compression Therapy Performed for Wound Assessment: Wound #2 Right,Anterior Lower Leg Performed By: Clinician Shawn Stall, RN Compression Type: Double Layer Post Procedure Diagnosis Same as Pre-procedure Electronic Signature(s) Signed: 11/06/2022 12:07:50 PM By: Shawn Stall RN, BSN Entered By: Shawn Stall on 11/06/2022 06:28:32 -------------------------------------------------------------------------------- Compression Therapy Details Patient Name: Date of Service: [name] CE, RO GER W. 11/06/2022 8:00 A M Medical Record Number: [id number] Patient Account Number: [id number] Date of Birth/Sex: Treating RN: 1945/11/23 (78 y.o. [name] Primary Care Shannah Conteh: Sanjuan Dame Other Clinician: Referring Jorene Kaylor: Treating Tieara Flitton/Extender: Floyce Stakes in Treatment: 0 Compression Therapy Performed for Wound Assessment: Wound #4 Left,Posterior Calcaneus Performed By: Clinician Shawn Stall, RN Compression Type: Double Layer Post Procedure Diagnosis [name] ([id number]) [id number] Page 4 of 20 Same as Pre-procedure Electronic Signature(s) Signed: 11/06/2022 12:07:50 PM By: Shawn Stall RN, BSN Entered By: Shawn Stall on 11/06/2022 06:28:32 -------------------------------------------------------------------------------- Encounter Discharge Information Details Patient Name: Date of Service: [name] CE, RO GER W. 11/06/2022 8:00 A M Medical Record Number: [id number] Patient Account Number: [id number] Date of Birth/Sex: Treating RN: [birth year] (77 y.o. [name] Primary Care Bertine Schlottman: Sanjuan Dame Other Clinician: Referring Odes Lolli: Treating Reiley Bertagnolli/Extender: Floyce Stakes in Treatment: 0 Encounter Discharge Information Items Post Procedure  Vitals Discharge Condition: Stable Temperature (F): 97.8 Ambulatory Status: Ambulatory Pulse (bpm): 82 Discharge Destination: Home Respiratory Rate (breaths/min): 20 Transportation: Private Auto Blood Pressure (mmHg): 144/93 Accompanied By: self Schedule Follow-up Appointment: Yes Clinical Summary of Care: Electronic Signature(s) Signed: 11/06/2022 12:07:50 PM By: Shawn Stall RN, BSN Entered By: Shawn Stall on 11/06/2022 06:32:58 -------------------------------------------------------------------------------- Lower Extremity Assessment Details Patient Name: Date of Service: LO V ELA CE, RO GER W. 11/06/2022 8:00 A M Medical Record Number: [id number] Patient Account Number: [id number] Date of Birth/Sex: Treating RN: 08-17-1945 (77 y.o. [name] Primary Care Ramonda Galyon: Sanjuan Dame Other Clinician: Referring Shavelle Runkel: Treating Mirra Basilio/Extender: Floyce Stakes in Treatment: 0 Edema Assessment Assessed: Kyra Searles: Yes] Franne Forts: Yes] Edema: [Left: Yes] [Right: Yes] Calf Left: Right: Point of Measurement: 46 cm From Medial Instep 34 cm 36 cm  Ankle Left: Right: Point of Measurement: 13 cm From Medial Instep 26 cm 25 cm Knee To Floor Left: Right: From Medial Instep 51 cm Vascular Assessment Left: [[id number] Page 5 of 20Right:] Pulses: Dorsalis Pedis Palpable: [phone].pdf Page 5 of 20Yes Yes] Doppler Audible: [phone].pdf Page 5 of 20Yes] Posterior Tibial Palpable: [[id number] Page 5 of 20Yes] Doppler Audible: [phone].pdf Page 5 of 20Yes] Extremity colors, hair growth, and conditions: Extremity Color: [phone].pdf Page 5 of 20Hyperpigmented Hyperpigmented] Hair Growth on Extremity: [phone].pdf Page 5 of 20No No] Temperature of Extremity:  [phone].pdf Page 5 of 20Hot Warm] Capillary Refill: [phone].pdf Page 5 of 20< 3 seconds < 3 seconds] Dependent Rubor: [phone].pdf Page 5 of 20No No] Blanched when Elevated: [phone].pdf Page 5 of 20No No] Lipodermatosclerosis: [phone].pdf Page 5 of 20Yes Yes] Blood Pressure: Brachial: [phone].pdf Page 5 of 20144] Ankle: [phone].pdf Page 5 of 20Dorsalis Pedis: 160 Dorsalis Pedis: 168 1.11 1.17] Toe Nail Assessment Left: Right: Thick: Yes Yes Discolored: Yes Yes Deformed: Yes Yes Improper Length and Hygiene: No No Notes right great toe to anterior ankle red, hot and edematous. Electronic Signature(s) Signed: 11/06/2022 12:07:50 PM By: Shawn Stall RN, BSN Entered By: Shawn Stall on 11/06/2022 05:58:51 -------------------------------------------------------------------------------- Multi Wound Chart Details Patient Name: Date of Service: [name] CE, RO GER W. 11/06/2022 8:00 A M Medical Record Number: [id number] Patient Account Number: [id number] Date of Birth/Sex: Treating RN: May 04, 1945 (77 y.o. 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 JEANETTE, DIFRANCO ([id number]) [id number] Page 6 of  20 Arrhythmia, Hypertension, Vasculitis, Arrhythmia, Hypertension, Vasculitis, Arrhythmia, Hypertension, Vasculitis, Comorbid History: Type II Diabetes Type II Diabetes Type II Diabetes 08/15/2022 11/02/2022 07/15/2022 Date Acquired: 0 0 0 Weeks of Treatment: Open Open Open Wound Status: No No No Wound Recurrence: Yes No No Clustered Wound: 2 N/A N/A Clustered Quantity: 4.2x1.2x0.1 1.4x0.8x0.1 0.9x1.5x1.2 Measurements L x W x D (cm) 3.958 0.88 1.06 A (cm) : rea 0.396 0.088 1.272 Volume (cm) : Full Thickness Without Exposed Partial Thickness Grade 2 Classification: Support Structures Medium Small Medium Exudate A mount: Serosanguineous Serosanguineous Serosanguineous Exudate Type: red, brown red, brown red, brown Exudate Color: Distinct, outline attached Distinct, outline attached Distinct, outline attached Wound Margin: Medium (34-66%) Large (67-100%) Medium (34-66%) Granulation A mount: Red Red, Pink Red Granulation Quality: Medium (34-66%) None Present (0%) Medium (34-66%) Necrotic A mount: Fat Layer (Subcutaneous Tissue): Yes Fascia: No Fat Layer (Subcutaneous Tissue): Yes Exposed Structures: Fascia: No Fat Layer (Subcutaneous Tissue): No Bone: Yes Tendon: No Tendon: No Fascia: No Muscle: No Muscle: No Tendon: No Joint: No Joint: No Muscle: No Bone: No Bone: No Joint: No Small (1-33%) Small (1-33%) None Epithelialization: Debridement - Excisional Debridement - Selective/Open Wound Debridement - Selective/Open Wound Debridement: Pre-procedure Verification/Time Out 09:00 09:00 09:00 Taken: Lidocaine 4% Topical Solution Lidocaine 4% Topical Solution Lidocaine 4% Topical Solution Pain Control: Subcutaneous, Slough Necrotic/Eschar, [employer] Tissue Debrided: Skin/Subcutaneous Tissue Non-Viable Tissue Skin/Epidermis Level: 3.96 0.88 1.06 Debridement A (sq cm): rea Curette Curette Curette Instrument: Minimum Minimum Minimum Bleeding: Pressure  Pressure Pressure Hemostasis A chieved: 0 0 0 Procedural Pain:  0 0 0 Post Procedural Pain: Procedure was tolerated well Procedure was tolerated well Procedure was tolerated well Debridement Treatment Response: 4.2x1.2x0.1 1.4x0.8x0.1 0.9x1.5x1 Post Debridement Measurements L x W x D (cm) 0.396 0.088 1.06 Post Debridement Volume: (cm) Excoriation: No Excoriation: No Callus: Yes Periwound Skin Texture: Induration: No Induration: No Excoriation: No Callus: No Callus: No Induration: No Crepitus: No Crepitus: No Crepitus: No Rash: No Rash: No Rash: No Scarring: No Scarring: No Scarring: No Dry/Scaly: Yes Maceration: No Maceration: Yes Periwound Skin Moisture: Maceration: No Dry/Scaly: No Dry/Scaly: No Hemosiderin Staining: Yes Atrophie Blanche: No Erythema: Yes Periwound Skin Color: Atrophie Blanche: No Cyanosis: No Atrophie Blanche: No Cyanosis: No Ecchymosis: No Cyanosis: No Ecchymosis: No Erythema: No Ecchymosis: No Erythema: No Hemosiderin Staining: No Hemosiderin Staining: No Mottled: No Mottled: No Mottled: No Pallor: No Pallor: No Pallor: No Rubor: No Rubor: No Rubor: No N/A N/A Circumferential, Red Streaks Erythema Location: N/A N/A Hot Temperature: N/A N/A Yes Tenderness on Palpation: Compression Therapy Compression Therapy Debridement Procedures Performed: Debridement Debridement Wound Number: 4 5 N/A Photos: No Photos No Photos N/A Left, Posterior Calcaneus Left T Fourth oe N/A Wound Location: Gradually Appeared Pressure Injury N/A Wounding Event: 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 N/A Primary Etiology: Extremity Extremity Arrhythmia, Hypertension, Vasculitis, Arrhythmia, Hypertension, Vasculitis, N/A Comorbid History: Type II Diabetes Type II Diabetes 08/18/2022 11/06/2022 N/A Date Acquired: 0 0 N/A Weeks of Treatment: Open Open N/A Wound Status: No No N/A Wound Recurrence: No No  N/A Clustered Wound: N/A N/A N/A Clustered Quantity: 1x1.3x0.1 0.5x0.5x0.1 N/A Measurements L x W x D (cm) 1.021 0.196 N/A A (cm) : rea 0.102 0.02 N/A Volume (cm) : Grade 1 Grade 1 N/A Classification: Medium Medium N/A Exudate A mount: Serosanguineous Serosanguineous N/A Exudate Type: red, brown red, brown N/A Exudate ColorMORELL, BRACE ([id number]) [id number] Page 7 of 20 Distinct, outline attached Distinct, outline attached N/A Wound Margin: Large (67-100%) Large (67-100%) N/A Granulation Amount: Red Red N/A Granulation Quality: Small (1-33%) None Present (0%) N/A Necrotic Amount: Fat Layer (Subcutaneous Tissue): Yes Fat Layer (Subcutaneous Tissue): Yes N/A Exposed Structures: Fascia: No Fascia: No Tendon: No Tendon: No Muscle: No Muscle: No Joint: No Joint: No Bone: No Bone: No Small (1-33%) None N/A Epithelialization: Debridement - Selective/Open Wound Debridement - Selective/Open Wound N/A Debridement: Pre-procedure Verification/Time Out 09:00 09:00 N/A Taken: Lidocaine 4% Topical Solution Lidocaine 4% T opical Solution N/A Pain Control: [employer], Slough N/A Tissue Debrided: Non-Viable Tissue Non-Viable Tissue N/A Level: 1.02 0.2 N/A Debridement A (sq cm): rea Curette Curette N/A Instrument: Minimum Minimum N/A Bleeding: Pressure Pressure N/A Hemostasis A chieved: 0 0 N/A Procedural Pain: 0 0 N/A Post Procedural Pain: Procedure was tolerated well Procedure was tolerated well N/A Debridement Treatment Response: 1x1.3x0.1 0.5x0.5x0.1 N/A Post Debridement Measurements L x W x D (cm) 0.102 0.02 N/A Post Debridement Volume: (cm) Excoriation: No Callus: Yes N/A Periwound Skin Texture: Induration: No Excoriation: No Callus: No Induration: No Crepitus: No Crepitus: No Rash: No Rash: No Scarring: No Scarring: No Maceration: No Maceration: No N/A Periwound Skin Moisture: Dry/Scaly: No Dry/Scaly:  No Atrophie Blanche: No Atrophie Blanche: No N/A Periwound Skin Color: Cyanosis: No Cyanosis: No Ecchymosis: No Ecchymosis: No Erythema: No Erythema: No Hemosiderin Staining: No Hemosiderin Staining: No Mottled: No Mottled: No Pallor: No Pallor: No Rubor: No Rubor: No N/A N/A N/A Erythema Location: N/A N/A N/A Temperature: Compression Therapy Debridement N/A Procedures Performed: Debridement Treatment Notes Wound #1 (Lower Leg) Wound Laterality:  Left, Posterior Cleanser Soap and Water Discharge Instruction: May shower and wash wound with dial antibacterial soap and water prior to dressing change. Vashe 5.8 (oz) Discharge Instruction: Cleanse the wound with Vashe prior to applying a clean dressing using gauze sponges, not tissue or cotton balls. Peri-Wound Care Sween Lotion (Moisturizing lotion) Discharge Instruction: Apply moisturizing lotion as directed Topical Primary Dressing Maxorb Extra Ag+ Alginate Dressing, 4x4.75 (in/in) Discharge Instruction: Apply to wound bed as instructed Secondary Dressing ABD Pad, 8x10 Discharge Instruction: Apply over primary dressing as directed. Woven Gauze Sponge, Non-Sterile 4x4 in Discharge Instruction: Apply over primary dressing as directed. Secured With Compression Wrap Urgo K2 Lite, (equivalent to a 3 layer) two layer compression system, regular Discharge Instruction: Apply Urgo K2 Lite as directed (alternative to 3 layer compression). Compression Stockings MAKHARI, POSTIGLIONE ([id number]) [id number] Page 8 of 20 Add-Ons Wound #2 (Lower Leg) Wound Laterality: Right, Anterior Cleanser Soap and Water Discharge Instruction: May shower and wash wound with dial antibacterial soap and water prior to dressing change. Vashe 5.8 (oz) Discharge Instruction: Cleanse the wound with Vashe prior to applying a clean dressing using gauze sponges, not tissue or cotton balls. Peri-Wound Care Sween Lotion  (Moisturizing lotion) Discharge Instruction: Apply moisturizing lotion as directed Topical Primary Dressing Maxorb Extra Ag+ Alginate Dressing, 4x4.75 (in/in) Discharge Instruction: Apply to wound bed as instructed Secondary Dressing ABD Pad, 8x10 Discharge Instruction: Apply over primary dressing as directed. Woven Gauze Sponge, Non-Sterile 4x4 in Discharge Instruction: Apply over primary dressing as directed. Secured With Compression Wrap Urgo K2 Lite, (equivalent to a 3 layer) two layer compression system, regular Discharge Instruction: Apply Urgo K2 Lite as directed (alternative to 3 layer compression). Compression Stockings Add-Ons Wound #3 (Toe Great) Wound Laterality: Right Cleanser Vashe 5.8 (oz) Discharge Instruction: Cleanse the wound with Vashe prior to applying a clean dressing using gauze sponges, not tissue or cotton balls. Wound Cleanser Discharge Instruction: Cleanse the wound with wound cleanser prior to applying a clean dressing using gauze sponges, not tissue or cotton balls. Peri-Wound Care Topical Gentamicin Discharge Instruction: As directed by physician Mupirocin Ointment Discharge Instruction: Apply Mupirocin (Bactroban) as instructed Primary Dressing Maxorb Extra Ag+ Alginate Dressing, 2x2 (in/in) Discharge Instruction: Apply to wound bed as instructed Secondary Dressing Optifoam Non-Adhesive Dressing, 4x4 in Discharge Instruction: Apply over primary dressing foam donut Woven Gauze Sponges 2x2 in Discharge Instruction: Apply over primary dressing as directed. Secured With Conforming Stretch Gauze Bandage, Sterile 2x75 (in/in) Discharge Instruction: Secure with stretch gauze as directed. 64M Medipore H Soft Cloth Surgical T ape, 4 x 10 (in/yd) Discharge Instruction: Secure with tape as directed. Compression Wrap Compression Stockings Add-Ons LORENE, FERRARI ([id number]) [phone].pdf Page 9 of 20 Wound #4 (Calcaneus) Wound  Laterality: Left, Posterior Cleanser Soap and Water Discharge Instruction: May shower and wash wound with dial antibacterial soap and water prior to dressing change. Vashe 5.8 (oz) Discharge Instruction: Cleanse the wound with Vashe prior to applying a clean dressing using gauze sponges, not tissue or cotton balls. Peri-Wound Care Sween Lotion (Moisturizing lotion) Discharge Instruction: Apply moisturizing lotion as directed Topical Primary Dressing Maxorb Extra Ag+ Alginate Dressing, 4x4.75 (in/in) Discharge Instruction: Apply to wound bed as instructed Secondary Dressing ALLEVYN Heel 4 1/2in x 5 1/2in / 10.5cm x 13.5cm Discharge Instruction: Apply over primary dressing as directed. Woven Gauze Sponge, Non-Sterile 4x4 in Discharge Instruction: Apply over primary dressing as directed. Secured With Compression Wrap Urgo K2 Lite, (equivalent to a 3 layer) two layer compression system, regular Discharge Instruction:  Apply Urgo K2 Lite as directed (alternative to 3 layer compression). Compression Stockings Add-Ons Wound #5 (Toe Fourth) Wound Laterality: Left Cleanser Vashe 5.8 (oz) Discharge Instruction: Cleanse the wound with Vashe prior to applying a clean dressing using gauze sponges, not tissue or cotton balls. Wound Cleanser Discharge Instruction: Cleanse the wound with wound cleanser prior to applying a clean dressing using gauze sponges, not tissue or cotton balls. Peri-Wound Care Topical Gentamicin Discharge Instruction: As directed by physician Mupirocin Ointment Discharge Instruction: Apply Mupirocin (Bactroban) as instructed Primary Dressing Maxorb Extra Ag+ Alginate Dressing, 2x2 (in/in) Discharge Instruction: Apply to wound bed as instructed Secondary Dressing Woven Gauze Sponges 2x2 in Discharge Instruction: Apply over primary dressing as directed. Secured With Conforming Stretch Gauze Bandage, Sterile 2x75 (in/in) Discharge Instruction: Secure with stretch gauze  as directed. 69M Medipore H Soft Cloth Surgical T ape, 4 x 10 (in/yd) Discharge Instruction: Secure with tape as directed. Compression Wrap Compression Stockings Add-Ons Electronic Signature(s) NIKOLAY, DUDIK ([id number]) [id number] Page 10 of 20 Signed: 11/06/2022 11:02:13 AM By: Duanne Guess MD FACS Entered By: Duanne Guess on 11/06/2022 08:02:13 -------------------------------------------------------------------------------- Multi-Disciplinary Care Plan Details Patient Name: Date of Service: [name] CE, RO GER W. 11/06/2022 8:00 A M Medical Record Number: [id number] Patient Account Number: [id number] Date of Birth/Sex: Treating RN: 1945-11-05 (77 y.o. Harlon Flor, Millard.Loa Primary Care Brylea Pita: Sanjuan Dame Other Clinician: Referring Eduard Penkala: Treating Gaberiel Youngblood/Extender: Floyce Stakes in Treatment: 0 Active Inactive Nutrition Nursing Diagnoses: Potential for alteratiion in Nutrition/Potential for imbalanced nutrition Goals: Patient/caregiver agrees to and verbalizes understanding of need to obtain nutritional consultation Date Initiated: 11/06/2022 Target Resolution Date: 11/19/2022 Goal Status: Active Patient/caregiver will maintain therapeutic glucose control Date Initiated: 11/06/2022 Target Resolution Date: 11/19/2022 Goal Status: Active Interventions: Assess HgA1c results as ordered upon admission and as needed Provide education on elevated blood sugars and impact on wound healing Provide education on nutrition Treatment Activities: Patient referred to Primary Care Physician for further nutritional evaluation : 11/06/2022 Notes: Orientation to the Wound Care Program Nursing Diagnoses: Knowledge deficit related to the wound healing center program Goals: Patient/caregiver will verbalize understanding of the Wound Healing Center Program Date Initiated: 11/06/2022 Target Resolution Date: 11/20/2022 Goal Status:  Active Interventions: Provide education on orientation to the wound center Notes: Pain, Acute or Chronic Nursing Diagnoses: Potential alteration in comfort, pain Goals: Patient will verbalize adequate pain control and receive pain control interventions during procedures as needed Date Initiated: 11/06/2022 Target Resolution Date: 11/19/2022 Goal Status: Active Patient/caregiver will verbalize comfort level met Date Initiated: 11/06/2022 Target Resolution Date: 11/19/2022 Goal Status: Active Interventions: Encourage patient to take pain medications as prescribed Provide education on pain management LEOPOLD, ESSINGER ([id number]) [id number] Page 11 of 20 Reposition patient for comfort Treatment Activities: Administer pain control measures as ordered : 11/06/2022 Notes: Wound/Skin Impairment Nursing Diagnoses: Knowledge deficit related to ulceration/compromised skin integrity Goals: Patient/caregiver will verbalize understanding of skin care regimen Date Initiated: 11/06/2022 Target Resolution Date: 12/18/2022 Goal Status: Active Interventions: Assess patient/caregiver ability to perform ulcer/skin care regimen upon admission and as needed Assess ulceration(s) every visit Provide education on ulcer and skin care Treatment Activities: Skin care regimen initiated : 11/06/2022 Topical wound management initiated : 11/06/2022 Notes: Electronic Signature(s) Signed: 11/06/2022 12:07:50 PM By: Shawn Stall RN, BSN Entered By: Shawn Stall on 11/06/2022 06:29:33 -------------------------------------------------------------------------------- Pain Assessment Details Patient Name: Date of Service: [name] CE, RO GER W. 11/06/2022 8:00 A M Medical Record Number: [id number] Patient Account Number: [id number]  Date of Birth/Sex: Treating RN: 08/17/1945 (77 y.o. [name] Primary Care Judy Pollman: Sanjuan Dame Other Clinician: Referring Kuuipo Anzaldo: Treating  Kimberly Nieland/Extender: Floyce Stakes in Treatment: 0 Active Problems Location of Pain Severity and Description of Pain Patient Has Paino Yes Site Locations Pain Location: Pain in Ulcers Rate the pain. Current Pain Level: 4 Pain Management and Medication Current Pain Management: Medication: No Cold Application: No Rest: No Massage: No LUAN, LEJA ([id number]) [id number] Page 12 of 20 Activity: No T.E.N.S.: No Heat Application: No Leg drop or elevation: No Is the Current Pain Management Adequate: Adequate How does your wound impact your activities of daily livingo Sleep: No Bathing: No Appetite: No Relationship With Others: No Bladder Continence: No Emotions: No Bowel Continence: No Work: No Toileting: No Drive: No Dressing: No Hobbies: No Psychologist, prison and probation services) Signed: 11/06/2022 12:07:50 PM By: Shawn Stall RN, BSN Entered By: Shawn Stall on 11/06/2022 05:30:38 -------------------------------------------------------------------------------- Patient/Caregiver Education Details Patient Name: Date of Service: [name] CE, RO GER W. 8/23/2024andnbsp8:00 A M Medical Record Number: [id number] Patient Account Number: [id number] Date of Birth/Gender: Treating RN: 1945/06/12 (77 y.o. [name] Primary Care Physician: Sanjuan Dame Other Clinician: Referring Physician: Treating Physician/Extender: Floyce Stakes in Treatment: 0 Education Assessment Education Provided To: Patient Education Topics Provided Wound/Skin Impairment: Handouts: Caring for Your Ulcer Methods: Explain/Verbal Responses: Reinforcements needed Electronic Signature(s) Signed: 11/06/2022 12:07:50 PM By: Shawn Stall RN, BSN Entered By: Shawn Stall on 11/06/2022 06:29:42 -------------------------------------------------------------------------------- Wound Assessment Details Patient Name: Date of  Service: [name] CE, RO GER W. 11/06/2022 8:00 A M Medical Record Number: [id number] Patient Account Number: [id number] Date of Birth/Sex: Treating RN: 24-Dec-1945 (77 y.o. [name] Primary Care Jamauri Kruzel: Sanjuan Dame Other Clinician: Referring Shaconda Hajduk: Treating Deondria Puryear/Extender: Floyce Stakes in Treatment: 0 Wound Status Wound Number: 1 Primary Etiology: Venous Leg Ulcer Wound Location: Left, Posterior Lower Leg Wound Status: Open Wounding Event: Gradually Appeared Comorbid History: Arrhythmia, Hypertension, Vasculitis, Type II Diabetes Date Acquired: 08/15/2022 Weeks Of Treatment: 0 EHREN, RENTERIA ([id number]) [phone].pdf Page 13 of 20 Clustered Wound: Yes Photos Wound Measurements Length: (cm) Width: (cm) Depth: (cm) Clustered Quantity: Area: (cm) Volume: (cm) 4.2 % Reduction in Area: 1.2 % Reduction in Volume: 0.1 Epithelialization: Small (1-33%) 2 Tunneling: No 3.958 Undermining: No 0.396 Wound Description Classification: Full Thickness Without Exposed Support Structures Wound Margin: Distinct, outline attached Exudate Amount: Medium Exudate Type: Serosanguineous Exudate Color: red, brown Foul Odor After Cleansing: No Slough/Fibrino Yes Wound Bed Granulation Amount: Medium (34-66%) Exposed Structure Granulation Quality: Red Fascia Exposed: No Necrotic Amount: Medium (34-66%) Fat Layer (Subcutaneous Tissue) Exposed: Yes Necrotic Quality: Adherent Slough Tendon Exposed: No Muscle Exposed: No Joint Exposed: No Bone Exposed: No Periwound Skin Texture Texture Color No Abnormalities Noted: No No Abnormalities Noted: No Callus: No Atrophie Blanche: No Crepitus: No Cyanosis: No Excoriation: No Ecchymosis: No Induration: No Erythema: No Rash: No Hemosiderin Staining: Yes Scarring: No Mottled: No Pallor: No Moisture Rubor: No No Abnormalities Noted: No Dry / Scaly: Yes Maceration:  No Treatment Notes Wound #1 (Lower Leg) Wound Laterality: Left, Posterior Cleanser Soap and Water Discharge Instruction: May shower and wash wound with dial antibacterial soap and water prior to dressing change. Vashe 5.8 (oz) Discharge Instruction: Cleanse the wound with Vashe prior to applying a clean dressing using gauze sponges, not tissue or cotton balls. Peri-Wound Care Sween Lotion (Moisturizing lotion) Discharge Instruction: Apply moisturizing lotion as directed  Topical Primary Dressing Maxorb Extra Ag+ Alginate Dressing, 4x4.75 (in/in) Discharge Instruction: Apply to wound bed as instructed JELANI, URVINA ([id number]) [id number] Page 14 of 20 Secondary Dressing ABD Pad, 8x10 Discharge Instruction: Apply over primary dressing as directed. Woven Gauze Sponge, Non-Sterile 4x4 in Discharge Instruction: Apply over primary dressing as directed. Secured With Compression Wrap Urgo K2 Lite, (equivalent to a 3 layer) two layer compression system, regular Discharge Instruction: Apply Urgo K2 Lite as directed (alternative to 3 layer compression). Compression Stockings Add-Ons Electronic Signature(s) Signed: 11/06/2022 12:07:50 PM By: Shawn Stall RN, BSN Entered By: Shawn Stall on 11/06/2022 05:52:58 -------------------------------------------------------------------------------- Wound Assessment Details Patient Name: Date of Service: [name] CE, RO GER W. 11/06/2022 8:00 A M Medical Record Number: [id number] Patient Account Number: [id number] Date of Birth/Sex: Treating RN: 08/02/1945 (77 y.o. Harlon Flor, Millard.Loa Primary Care Yudith Norlander: Sanjuan Dame Other Clinician: Referring Rahm Minix: Treating Tyronza Happe/Extender: Floyce Stakes in Treatment: 0 Wound Status Wound Number: 2 Primary Etiology: Skin Tear Wound Location: Right, Anterior Lower Leg Wound Status: Open Wounding Event: Trauma Comorbid History: Arrhythmia,  Hypertension, Vasculitis, Type II Diabetes Date Acquired: 11/02/2022 Weeks Of Treatment: 0 Clustered Wound: No Photos Wound Measurements Length: (cm) 1.4 Width: (cm) 0.8 Depth: (cm) 0.1 Area: (cm) 0.88 Volume: (cm) 0.088 % Reduction in Area: % Reduction in Volume: Epithelialization: Small (1-33%) Tunneling: No Undermining: No Wound Description Classification: Partial Thickness Wound Margin: Distinct, outline attached Exudate Amount: Small Exudate Type: Serosanguineous Exudate Color: red, brown Foul Odor After Cleansing: No Slough/Fibrino No Wound Bed [name] ([id number]) [id number] Page 15 of 20 Granulation Amount: Large (67-100%) Exposed Structure Granulation Quality: Red, Pink Fascia Exposed: No Necrotic Amount: None Present (0%) Fat Layer (Subcutaneous Tissue) Exposed: No Tendon Exposed: No Muscle Exposed: No Joint Exposed: No Bone Exposed: No Periwound Skin Texture Texture Color No Abnormalities Noted: No No Abnormalities Noted: No Callus: No Atrophie Blanche: No Crepitus: No Cyanosis: No Excoriation: No Ecchymosis: No Induration: No Erythema: No Rash: No Hemosiderin Staining: No Scarring: No Mottled: No Pallor: No Moisture Rubor: No No Abnormalities Noted: No Dry / Scaly: No Maceration: No Treatment Notes Wound #2 (Lower Leg) Wound Laterality: Right, Anterior Cleanser Soap and Water Discharge Instruction: May shower and wash wound with dial antibacterial soap and water prior to dressing change. Vashe 5.8 (oz) Discharge Instruction: Cleanse the wound with Vashe prior to applying a clean dressing using gauze sponges, not tissue or cotton balls. Peri-Wound Care Sween Lotion (Moisturizing lotion) Discharge Instruction: Apply moisturizing lotion as directed Topical Primary Dressing Maxorb Extra Ag+ Alginate Dressing, 4x4.75 (in/in) Discharge Instruction: Apply to wound bed as instructed Secondary Dressing ABD  Pad, 8x10 Discharge Instruction: Apply over primary dressing as directed. Woven Gauze Sponge, Non-Sterile 4x4 in Discharge Instruction: Apply over primary dressing as directed. Secured With Compression Wrap Urgo K2 Lite, (equivalent to a 3 layer) two layer compression system, regular Discharge Instruction: Apply Urgo K2 Lite as directed (alternative to 3 layer compression). Compression Stockings Add-Ons Electronic Signature(s) Signed: 11/06/2022 12:07:50 PM By: Shawn Stall RN, BSN Entered By: Shawn Stall on 11/06/2022 05:53:18 -------------------------------------------------------------------------------- Wound Assessment Details Patient Name: Date of Service: [name] CE, RO GER W. 11/06/2022 8:00 A M Medical Record Number: [id number] Patient Account Number: [id number] OLE, DEBOLD ([id number]) [phone].pdf Page 16 of 20 Date of Birth/Sex: Treating RN: 1945-08-27 (77 y.o. [name] Primary Care Pearson Reasons: Sanjuan Dame Other Clinician: Referring Javoni Lucken: Treating Lakyn Alsteen/Extender: Floyce Stakes in Treatment: 0  Wound Status Wound Number: 3 Primary Etiology: Diabetic Wound/Ulcer of the Lower Extremity Wound Location: Right T Great oe Wound Status: Open Wounding Event: Gradually Appeared Comorbid History: Arrhythmia, Hypertension, Vasculitis, Type II Diabetes Date Acquired: 07/15/2022 Weeks Of Treatment: 0 Clustered Wound: No Photos Wound Measurements Length: (cm) 0.9 Width: (cm) 1.5 Depth: (cm) 1.2 Area: (cm) 1.06 Volume: (cm) 1.272 % Reduction in Area: % Reduction in Volume: Epithelialization: None Tunneling: No Undermining: No Wound Description Classification: Grade 2 Wound Margin: Distinct, outline attached Exudate Amount: Medium Exudate Type: Serosanguineous Exudate Color: red, brown Foul Odor After Cleansing: No Slough/Fibrino Yes Wound Bed Granulation Amount: Medium (34-66%) Exposed  Structure Granulation Quality: Red Fascia Exposed: No Necrotic Amount: Medium (34-66%) Fat Layer (Subcutaneous Tissue) Exposed: Yes Necrotic Quality: Adherent Slough Tendon Exposed: No Muscle Exposed: No Joint Exposed: No Bone Exposed: Yes Periwound Skin Texture Texture Color No Abnormalities Noted: No No Abnormalities Noted: No Callus: Yes Atrophie Blanche: No Crepitus: No Cyanosis: No Excoriation: No Ecchymosis: No Induration: No Erythema: Yes Rash: No Erythema Location: Circumferential, Red Streaks Scarring: No Hemosiderin Staining: No Mottled: No Moisture Pallor: No No Abnormalities Noted: No Rubor: No Dry / Scaly: No Maceration: Yes Temperature / Pain Temperature: Hot Tenderness on Palpation: Yes Treatment Notes Wound #3 (Toe Great) Wound Laterality: Right Cleanser Vashe 5.8 (oz) Discharge Instruction: Cleanse the wound with Vashe prior to applying a clean dressing using gauze sponges, not tissue or cotton balls. TRASE, TRAUGOTT ([id number]) [id number] Page 17 of 20 Wound Cleanser Discharge Instruction: Cleanse the wound with wound cleanser prior to applying a clean dressing using gauze sponges, not tissue or cotton balls. Peri-Wound Care Topical Gentamicin Discharge Instruction: As directed by physician Mupirocin Ointment Discharge Instruction: Apply Mupirocin (Bactroban) as instructed Primary Dressing Maxorb Extra Ag+ Alginate Dressing, 2x2 (in/in) Discharge Instruction: Apply to wound bed as instructed Secondary Dressing Optifoam Non-Adhesive Dressing, 4x4 in Discharge Instruction: Apply over primary dressing foam donut Woven Gauze Sponges 2x2 in Discharge Instruction: Apply over primary dressing as directed. Secured With Conforming Stretch Gauze Bandage, Sterile 2x75 (in/in) Discharge Instruction: Secure with stretch gauze as directed. 22M Medipore H Soft Cloth Surgical T ape, 4 x 10 (in/yd) Discharge Instruction: Secure  with tape as directed. Compression Wrap Compression Stockings Add-Ons Electronic Signature(s) Signed: 11/06/2022 12:07:50 PM By: Shawn Stall RN, BSN Entered By: Shawn Stall on 11/06/2022 06:11:54 -------------------------------------------------------------------------------- Wound Assessment Details Patient Name: Date of Service: [name] CE, RO GER W. 11/06/2022 8:00 A M Medical Record Number: [id number] Patient Account Number: [id number] Date of Birth/Sex: Treating RN: 06/03/1945 (77 y.o. Harlon Flor, Millard.Loa Primary Care Lakara Weiland: Sanjuan Dame Other Clinician: Referring Skyeler Scalese: Treating Tricia Oaxaca/Extender: Floyce Stakes in Treatment: 0 Wound Status Wound Number: 4 Primary Etiology: Diabetic Wound/Ulcer of the Lower Extremity Wound Location: Left, Posterior Calcaneus Wound Status: Open Wounding Event: Gradually Appeared Comorbid History: Arrhythmia, Hypertension, Vasculitis, Type II Diabetes Date Acquired: 08/18/2022 Weeks Of Treatment: 0 Clustered Wound: No Wound Measurements Length: (cm) 1 Width: (cm) 1.3 Depth: (cm) 0.1 Area: (cm) 1.021 Volume: (cm) 0.102 % Reduction in Area: % Reduction in Volume: Epithelialization: Small (1-33%) Tunneling: No Undermining: No Wound Description Classification: Grade 1 Wound Margin: Distinct, outline attached Exudate Amount: Medium Exudate Type: Serosanguineous QAMAR, BRAGA ([id number]) Exudate Color: red, brown Foul Odor After Cleansing: No Slough/Fibrino Yes [phone] Page 18 of 20 Wound Bed Granulation Amount: Large (67-100%) Exposed Structure Granulation Quality: Red Fascia Exposed: No Necrotic Amount: Small (1-33%) Fat Layer (Subcutaneous Tissue) Exposed: Yes Necrotic Quality:  Adherent Slough Tendon Exposed: No Muscle Exposed: No Joint Exposed: No Bone Exposed: No Periwound Skin Texture Texture Color No Abnormalities Noted: No No Abnormalities Noted:  No Callus: No Atrophie Blanche: No Crepitus: No Cyanosis: No Excoriation: No Ecchymosis: No Induration: No Erythema: No Rash: No Hemosiderin Staining: No Scarring: No Mottled: No Pallor: No Moisture Rubor: No No Abnormalities Noted: No Dry / Scaly: No Maceration: No Treatment Notes Wound #4 (Calcaneus) Wound Laterality: Left, Posterior Cleanser Soap and Water Discharge Instruction: May shower and wash wound with dial antibacterial soap and water prior to dressing change. Vashe 5.8 (oz) Discharge Instruction: Cleanse the wound with Vashe prior to applying a clean dressing using gauze sponges, not tissue or cotton balls. Peri-Wound Care Sween Lotion (Moisturizing lotion) Discharge Instruction: Apply moisturizing lotion as directed Topical Primary Dressing Maxorb Extra Ag+ Alginate Dressing, 4x4.75 (in/in) Discharge Instruction: Apply to wound bed as instructed Secondary Dressing ALLEVYN Heel 4 1/2in x 5 1/2in / 10.5cm x 13.5cm Discharge Instruction: Apply over primary dressing as directed. Woven Gauze Sponge, Non-Sterile 4x4 in Discharge Instruction: Apply over primary dressing as directed. Secured With Compression Wrap Urgo K2 Lite, (equivalent to a 3 layer) two layer compression system, regular Discharge Instruction: Apply Urgo K2 Lite as directed (alternative to 3 layer compression). Compression Stockings Add-Ons Electronic Signature(s) Signed: 11/06/2022 12:07:50 PM By: Shawn Stall RN, BSN Entered By: Shawn Stall on 11/06/2022 06:11:32 [name] ([id number]) [id number] Page 19 of 20 -------------------------------------------------------------------------------- Wound Assessment Details Patient Name: Date of Service: [name] CE, RO GER W. 11/06/2022 8:00 A M Medical Record Number: [id number] Patient Account Number: [id number] Date of Birth/Sex: Treating RN: May 16, 1945 (77 y.o. Harlon Flor, Millard.Loa Primary Care Sadeel Fiddler: Sanjuan Dame Other Clinician: Referring Debara Kamphuis: Treating Reynold Mantell/Extender: Floyce Stakes in Treatment: 0 Wound Status Wound Number: 5 Primary Etiology: Diabetic Wound/Ulcer of the Lower Extremity Wound Location: Left T Fourth oe Wound Status: Open Wounding Event: Pressure Injury Comorbid History: Arrhythmia, Hypertension, Vasculitis, Type II Diabetes Date Acquired: 11/06/2022 Weeks Of Treatment: 0 Clustered Wound: No Wound Measurements Length: (cm) 0.5 Width: (cm) 0.5 Depth: (cm) 0.1 Area: (cm) 0.196 Volume: (cm) 0.02 % Reduction in Area: % Reduction in Volume: Epithelialization: None Tunneling: No Undermining: No Wound Description Classification: Grade 1 Wound Margin: Distinct, outline attached Exudate Amount: Medium Exudate Type: Serosanguineous Exudate Color: red, brown Foul Odor After Cleansing: No Slough/Fibrino No Wound Bed Granulation Amount: Large (67-100%) Exposed Structure Granulation Quality: Red Fascia Exposed: No Necrotic Amount: None Present (0%) Fat Layer (Subcutaneous Tissue) Exposed: Yes Tendon Exposed: No Muscle Exposed: No Joint Exposed: No Bone Exposed: No Periwound Skin Texture Texture Color No Abnormalities Noted: No No Abnormalities Noted: No Callus: Yes Atrophie Blanche: No Crepitus: No Cyanosis: No Excoriation: No Ecchymosis: No Induration: No Erythema: No Rash: No Hemosiderin Staining: No Scarring: No Mottled: No Pallor: No Moisture Rubor: No No Abnormalities Noted: No Dry / Scaly: No Maceration: No Treatment Notes Wound #5 (Toe Fourth) Wound Laterality: Left Cleanser Vashe 5.8 (oz) Discharge Instruction: Cleanse the wound with Vashe prior to applying a clean dressing using gauze sponges, not tissue or cotton balls. Wound Cleanser Discharge Instruction: Cleanse the wound with wound cleanser prior to applying a clean dressing using gauze sponges, not tissue or cotton balls. Peri-Wound  Care Topical Gentamicin Discharge Instruction: As directed by physician Mupirocin Ointment Discharge Instruction: Apply Mupirocin (Bactroban) as instructed RAESHON, GANTT ([id number]) [id number] Page 20 of 20 Primary Dressing Maxorb Extra Ag+ Alginate Dressing, 2x2 (in/in) Discharge  Instruction: Apply to wound bed as instructed Secondary Dressing Woven Gauze Sponges 2x2 in Discharge Instruction: Apply over primary dressing as directed. Secured With Conforming Stretch Gauze Bandage, Sterile 2x75 (in/in) Discharge Instruction: Secure with stretch gauze as directed. 47M Medipore H Soft Cloth Surgical T ape, 4 x 10 (in/yd) Discharge Instruction: Secure with tape as directed. Compression Wrap Compression Stockings Add-Ons Electronic Signature(s) Signed: 11/06/2022 12:07:50 PM By: Shawn Stall RN, BSN Entered By: Shawn Stall on 11/06/2022 06:16:41 -------------------------------------------------------------------------------- Vitals Details Patient Name: Date of Service: [name] CE, RO GER W. 11/06/2022 8:00 A M Medical Record Number: [id number] Patient Account Number: [id number] Date of Birth/Sex: Treating RN: 1945-11-13 (77 y.o. [name] Primary Care Iman Orourke: Sanjuan Dame Other Clinician: Referring Shareena Nusz: Treating Kaylum Shrum/Extender: Floyce Stakes in Treatment: 0 Vital Signs Time Taken: 08:26 Temperature (F): 97.8 Height (in): 73 Pulse (bpm): 82 Source: Stated Respiratory Rate (breaths/min): 20 Weight (lbs): 192 Blood Pressure (mmHg): 144/93 Source: Stated Capillary Blood Glucose (mg/dl): 616 Body Mass Index (BMI): 25.3 Reference Range: 80 - 120 mg / dl Electronic Signature(s) Signed: 11/06/2022 12:07:50 PM By: Shawn Stall RN, BSN Entered By: Shawn Stall on 11/06/2022 07:37:10

## 2022-11-09 ENCOUNTER — Other Ambulatory Visit (HOSPITAL_BASED_OUTPATIENT_CLINIC_OR_DEPARTMENT_OTHER): Payer: Self-pay

## 2022-11-09 MED ORDER — GLIPIZIDE ER 2.5 MG PO TB24: 2.5000 mg | ORAL_TABLET | Freq: Every day | ORAL | 1 refills | Status: DC

## 2022-11-09 NOTE — Progress Notes (Signed)
TYRANE, ASKAR ([id number]) [id number] Page 1 of 15 Visit Report for 11/06/2022 Chief Complaint Document Details Patient Name: Date of Service: [name], [name] 11/06/2022 8:00 A M Medical Record Number: [id number] Patient Account Number: [id number] Date of Birth/Sex: Treating RN: 09/29/1945 (77 y.o. 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 11/06/2022 8:00 A M Medical Record Number: [id number] Patient Account Number: [id number] Date of Birth/Sex: Treating RN: 1946-01-05 (77 y.o. [name] Primary Care Provider: Sanjuan Dame Other Clinician: Referring Provider: Treating Provider/Extender: Floyce Stakes in Treatment: 0 Debridement Performed for Assessment: Wound #1 Left,Posterior Lower Leg Performed By: Physician Duanne Guess, MD Debridement Type: Debridement Severity of Tissue Pre Debridement: Fat layer exposed Level of Consciousness (Pre-procedure): Awake and Alert Pre-procedure Verification/Time Out Yes - 09:00 Taken: Start Time: 09:01 Pain Control: Lidocaine 4% T opical Solution Percent of Wound Bed Debrided: 100% T Area Debrided (cm): otal 3.96 Tissue and other material debrided: Viable, Non-Viable, Slough, Subcutaneous, Skin: Dermis , Skin: Epidermis, Slough Level: Skin/Subcutaneous Tissue Debridement Description: Excisional Instrument:  Curette Bleeding: Minimum Hemostasis Achieved: Pressure End Time: 09:12 Procedural Pain: 0 Post Procedural Pain: 0 Response to Treatment: Procedure was tolerated well Level of Consciousness (Post- Awake and Alert procedure): Post Debridement Measurements of Total Wound Length: (cm) 4.2 Width: (cm) 1.2 Depth: (cm) 0.1 Volume: (cm) 0.396 Character of Wound/Ulcer Post Debridement: Improved [name], [name] ([id number]) [id number] Page 2 of 15 Severity of Tissue Post Debridement: Fat layer exposed Post Procedure Diagnosis Same as Pre-procedure Electronic Signature(s) Signed: 11/06/2022 12:07:50 PM By: Shawn Stall RN, BSN Signed: 11/06/2022 12:35:56 PM By: Duanne Guess MD FACS Entered By: Shawn Stall on 11/06/2022 06:12:56 -------------------------------------------------------------------------------- Debridement Details Patient Name: Date of Service: [name], [name] 11/06/2022 8:00 A M Medical Record Number: [id number] Patient Account Number: [id number] Date of Birth/Sex: Treating RN: [birth year] (77 y.o. [name] Primary Care Provider: Sanjuan Dame Other Clinician: Referring Provider: Treating Provider/Extender: Floyce Stakes in Treatment: 0 Debridement Performed for Assessment: Wound #2 Right,Anterior Lower Leg Performed By: Physician Duanne Guess, MD Debridement Type: Debridement Level of Consciousness (Pre-procedure): Awake and Alert Pre-procedure Verification/Time Out Yes - 09:00 Taken: Start Time: 09:01 Pain Control: Lidocaine 4% Topical Solution Percent of Wound Bed Debrided: 100% T Area Debrided (cm): otal 0.88 Tissue and other material debrided: Non-Viable, Eschar, Slough, Slough Level: Non-Viable Tissue Debridement Description: Selective/Open Wound Instrument: Curette Bleeding: Minimum Hemostasis Achieved: Pressure End Time: 09:12 Procedural Pain: 0 Post Procedural Pain:  0 Response to Treatment: Procedure was tolerated well Level of Consciousness (Post- Awake and Alert procedure): Post Debridement Measurements of Total Wound Length: (cm) 1.4 Width: (cm) 0.8 Depth: (cm) 0.1 Volume: (cm) 0.088 Character of Wound/Ulcer Post Debridement: Improved Post Procedure Diagnosis Same as Pre-procedure Electronic Signature(s) Signed: 11/06/2022 12:07:50 PM By: Shawn Stall RN, BSN Signed: 11/06/2022 12:35:56 PM By: Duanne Guess MD FACS Entered By: Shawn Stall on 11/06/2022 06:13:45 Debridement Details -------------------------------------------------------------------------------- [name] ([id number]) [id number] Page 3 of 15 Patient Name: Date of Service: [name], Troy GER  W. 11/06/2022 8:00 A M Medical Record Number: [id number] Patient Account Number: [id number] Date of Birth/Sex: Treating RN: [birth year] (77 y.o. [name] Primary Care Provider: Sanjuan Dame Other Clinician: Referring Provider: Treating Provider/Extender: Floyce Stakes in Treatment: 0 Debridement Performed for Assessment: Wound #3 Right T Great oe Performed By: Physician Duanne Guess, MD Debridement Type: Debridement Severity of Tissue Pre Debridement: Fat layer exposed Level of Consciousness (Pre-procedure): Awake and Alert Pre-procedure Verification/Time Out Yes - 09:00 Taken: Start Time: 09:01 Pain Control: Lidocaine 4% T opical Solution Percent of Wound Bed Debrided: 100% T Area Debrided (cm): otal 1.06 Tissue and other material debrided: Viable, Non-Viable, Slough, Skin: Dermis , Skin: Epidermis, Slough Level: Skin/Epidermis Debridement Description: Selective/Open Wound Instrument: Curette Bleeding: Minimum Hemostasis Achieved: Pressure End Time: 09:12 Procedural Pain: 0 Post Procedural Pain: 0 Response to Treatment: Procedure was tolerated well Level of Consciousness (Post- Awake and  Alert procedure): Post Debridement Measurements of Total Wound Length: (cm) 0.9 Width: (cm) 1.5 Depth: (cm) 1 Volume: (cm) 1.06 Character of Wound/Ulcer Post Debridement: Improved Severity of Tissue Post Debridement: Fat layer exposed Post Procedure Diagnosis Same as Pre-procedure Electronic Signature(s) Signed: 11/06/2022 12:07:50 PM By: Shawn Stall RN, BSN Signed: 11/06/2022 12:35:56 PM By: Duanne Guess MD FACS Entered By: Shawn Stall on 11/06/2022 06:14:18 -------------------------------------------------------------------------------- Debridement Details Patient Name: Date of Service: [name], [name] 11/06/2022 8:00 A M Medical Record Number: [id number] Patient Account Number: [id number] Date of Birth/Sex: Treating RN: [birth year] (77 y.o. [name] Primary Care Provider: Sanjuan Dame Other Clinician: Referring Provider: Treating Provider/Extender: Floyce Stakes in Treatment: 0 Debridement Performed for Assessment: Wound #4 Left,Posterior Calcaneus Performed By: Physician Duanne Guess, MD Debridement Type: Debridement Severity of Tissue Pre Debridement: Fat layer exposed Level of Consciousness (Pre-procedure): Awake and Alert Pre-procedure Verification/Time Out Yes - 09:00 Taken: Start Time: 09:01 Pain Control: Lidocaine 4% T opical Solution Percent of Wound Bed Debrided: 100% T Area Debrided (cm): otal 1.02 Tissue and other material debrided: Non-Viable, Slough, Slough Level: Non-Viable Tissue Debridement Description: Selective/Open Wound REAVIS, TEWS ([id number]) [id number] Page 4 of 15 Instrument: Curette Bleeding: Minimum Hemostasis Achieved: Pressure End Time: 09:12 Procedural Pain: 0 Post Procedural Pain: 0 Response to Treatment: Procedure was tolerated well Level of Consciousness (Post- Awake and Alert procedure): Post Debridement Measurements of Total Wound Length:  (cm) 1 Width: (cm) 1.3 Depth: (cm) 0.1 Volume: (cm) 0.102 Character of Wound/Ulcer Post Debridement: Improved Severity of Tissue Post Debridement: Fat layer exposed Post Procedure Diagnosis Same as Pre-procedure Electronic Signature(s) Signed: 11/06/2022 12:07:50 PM By: Shawn Stall RN, BSN Signed: 11/06/2022 12:35:56 PM By: Duanne Guess MD FACS Entered By: Shawn Stall on 11/06/2022 06:14:46 -------------------------------------------------------------------------------- Debridement Details Patient Name: Date of Service: [name], [name] 11/06/2022 8:00 A M Medical Record Number: [id number] Patient Account Number: [id number] Date of Birth/Sex: Treating RN: [birth year] (77 y.o. [name] Primary Care Provider: Sanjuan Dame Other Clinician: Referring Provider: Treating Provider/Extender: Floyce Stakes in Treatment: 0 Debridement Performed for Assessment: Wound #5 Left T Fourth oe Performed By: Physician Duanne Guess, MD Debridement Type: Debridement Severity of Tissue Pre Debridement: Limited to breakdown of skin Level of Consciousness (Pre-procedure): Awake and Alert Pre-procedure Verification/Time Out Yes - 09:00 Taken: Start Time: 09:01 Pain Control: Lidocaine 4% Topical Solution Percent of Wound Bed Debrided: 100% T Area Debrided (cm): otal 0.2 Tissue and other material debrided: Non-Viable, Callus, Slough, [employer]  Level: Non-Viable Tissue Debridement Description: Selective/Open Wound Instrument: Curette Bleeding: Minimum Hemostasis Achieved: Pressure End Time: 09:12 Procedural Pain: 0 Post Procedural Pain: 0 Response to Treatment: Procedure was tolerated well Level of Consciousness (Post- Awake and Alert procedure): Post Debridement Measurements of Total Wound Length: (cm) 0.5 Width: (cm) 0.5 Depth: (cm) 0.1 Volume: (cm) 0.02 Character of Wound/Ulcer Post Debridement: Improved Severity of Tissue Post  Debridement: Limited to breakdown of skin Post Procedure Diagnosis [name], [name] ([id number]) [id number] Page 5 of 15 Same as Pre-procedure Electronic Signature(s) Signed: 11/06/2022 12:07:50 PM By: Shawn Stall RN, BSN Signed: 11/06/2022 12:35:56 PM By: Duanne Guess MD FACS Entered By: Shawn Stall on 11/06/2022 06:20:58 -------------------------------------------------------------------------------- HPI Details Patient Name: Date of Service: [name], [name] 11/06/2022 8:00 A M Medical Record Number: [id number] Patient Account Number: [id number] Date of Birth/Sex: Treating RN: Apr 18, 1945 (77 y.o. 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 It appears that he has been receiving some treatment through the [facility] system, based on review of the electronic medical record. Unfortunately, the patient is an extremely poor historian and his recollection of timelines and interventions is somewhat scattered. He has an ulcer on his left heel, left posterior calf, left fourth toe, right great toe, right anterior tibia. Based upon what he had on his wounds when he came to clinic, he has been applying Xeroform to everything. Electronic Signature(s) Signed: 11/06/2022 11:10:35 AM By: Duanne Guess MD FACS Previous Signature: 11/06/2022 11:05:52 AM Version By: Duanne Guess MD FACS Entered By: Duanne Guess on 11/06/2022 08:10:35 -------------------------------------------------------------------------------- Physical Exam Details Patient Name: Date of Service: [name], Troy GER  W. 11/06/2022 8:00 A M Medical Record Number: [id number] Patient Account Number: [id number] Date of Birth/Sex: Treating RN: [birth year] (77 y.o. M) Primary Care Provider: Sanjuan Dame Other Clinician: Referring Provider: Treating Provider/Extender: Floyce Stakes in Treatment: 0 Constitutional Slightly hypertensive. . . . No acute distress. Respiratory Normal work of breathing on room air. Cardiovascular Changes consistent with chronic venous insufficiency; stasis dermatitis.. Notes 11/06/2022: On his right anterior tibial surface, there is a skin tear that involves the fat layer. His right great toe is massively swollen and erythematous. There are 2 separate openings into the tissue, 1 of which probes straight back and hit the nailbed from below. The ulcer on his left heel has a layer of dry eschar on it. There is a callus on his left fourth toe that has discoloration underneath. Once the callus was removed, there was an open wound discovered. On his left posterior calf, there is a vertical ulcer that he says was substantially larger in the past. There is a layer of slough on the surface. Electronic Signature(s) Signed: 11/06/2022 11:11:45 AM By: Duanne Guess MD FACS Previous Signature: 11/06/2022 11:09:53 AM Version By: Duanne Guess MD FACS [name] ([id number]) [id number] Page 6 of 15 Previous Signature: 11/06/2022 11:09:53 AM Version By: Duanne Guess MD FACS Entered By: Duanne Guess on 11/06/2022 08:11:45 -------------------------------------------------------------------------------- Physician Orders Details Patient Name: Date of Service: [name], [name] 11/06/2022 8:00 A M Medical Record Number: [id number] Patient Account Number: [id number] Date of Birth/Sex: Treating RN: 1946/02/24 (77 y.o.  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 - Dr .Lady Gary Return A ppointment in 2 weeks. - Dr .Lady Gary Return A Return appointment in 3 weeks. - Dr .Lady Gary Return appointment in 1 month. Other: - Pick up oral and topical antibiotics from your pharmacy. Please obtain x-ray of right great toe at any outpatient facility of [facility] - before you return next week. Anesthetic (In clinic) Topical Lidocaine 4% applied to wound bed Bathing/ Shower/ Hygiene May shower with protection but do not get wound dressing(s) wet. Protect dressing(s) with water repellant cover (for example, large plastic bag) or a cast cover and may then take shower. Edema Control - Lymphedema / SCD / Other Elevate legs to the level of the heart or above for 30 minutes daily and/or when sitting for 3-4 times a day throughout the day. Avoid standing for long periods of time. Off-Loading Open toe surgical shoe to: - peg assist shoe to right foot wear while walking and standing. Wound Treatment Wound #1 - Lower Leg Wound Laterality: Left, Posterior Cleanser: Soap and Water 1 x Per Week/30 Days Discharge Instructions: May shower and wash wound with dial antibacterial soap and water prior to dressing change. Cleanser: Vashe 5.8 (oz) 1 x Per Week/30 Days Discharge Instructions: Cleanse the wound with Vashe prior to applying a clean dressing using gauze sponges, not tissue or cotton balls. Peri-Wound Care: Sween Lotion (Moisturizing lotion) 1 x Per Week/30 Days Discharge Instructions: Apply moisturizing lotion as  directed Prim Dressing: Maxorb Extra Ag+ Alginate Dressing, 4x4.75 (in/in) 1 x Per Week/30 Days ary Discharge Instructions: Apply to wound bed as instructed Secondary Dressing: ABD Pad, 8x10 1 x Per Week/30 Days Discharge Instructions: Apply over primary dressing as directed. Secondary Dressing: Woven Gauze Sponge, Non-Sterile 4x4 in 1 x Per Week/30 Days Discharge Instructions: Apply over primary dressing as directed. Compression Wrap: Urgo K2 Lite, (equivalent to a 3 layer) two layer compression system, regular 1 x Per Week/30 Days Discharge Instructions: Apply Urgo K2 Lite as directed (alternative to 3 layer compression). Wound #2 - Lower Leg Wound Laterality: Right, Anterior Cleanser: Soap and Water 1 x Per Week/30 Days [name], [name] ([id number]) [id number] Page 7 of 15 Discharge Instructions: May shower and wash wound with dial antibacterial soap and water prior to dressing change. Cleanser: Vashe 5.8 (oz) 1 x Per Week/30 Days Discharge Instructions: Cleanse the wound with Vashe prior to applying a clean dressing using gauze sponges, not tissue or cotton balls. Peri-Wound Care: Sween Lotion (Moisturizing lotion) 1 x Per Week/30 Days Discharge Instructions: Apply moisturizing lotion as directed Prim Dressing: Maxorb Extra Ag+ Alginate Dressing, 4x4.75 (in/in) 1 x Per Week/30 Days ary Discharge Instructions: Apply to wound bed as instructed Secondary Dressing: ABD Pad, 8x10 1 x Per Week/30 Days Discharge Instructions: Apply over primary dressing as directed. Secondary Dressing: Woven Gauze Sponge, Non-Sterile 4x4 in 1 x Per Week/30 Days Discharge Instructions: Apply over primary dressing as directed. Compression Wrap: Urgo K2 Lite, (equivalent to a 3 layer) two layer compression system, regular 1 x Per Week/30 Days Discharge Instructions: Apply Jeryl Columbia  K2 Lite as directed (alternative to 3 layer compression). Wound #3 - T Great oe Wound Laterality:  Right Cleanser: Vashe 5.8 (oz) 1 x Per Day/30 Days Discharge Instructions: Cleanse the wound with Vashe prior to applying a clean dressing using gauze sponges, not tissue or cotton balls. Cleanser: Wound Cleanser 1 x Per Day/30 Days Discharge Instructions: Cleanse the wound with wound cleanser prior to applying a clean dressing using gauze sponges, not tissue or cotton balls. Topical: Gentamicin 1 x Per Day/30 Days Discharge Instructions: As directed by physician Topical: Mupirocin Ointment 1 x Per Day/30 Days Discharge Instructions: Apply Mupirocin (Bactroban) as instructed Prim Dressing: Maxorb Extra Ag+ Alginate Dressing, 2x2 (in/in) 1 x Per Day/30 Days ary Discharge Instructions: Apply to wound bed as instructed Secondary Dressing: Optifoam Non-Adhesive Dressing, 4x4 in 1 x Per Day/30 Days Discharge Instructions: Apply over primary dressing foam donut Secondary Dressing: Woven Gauze Sponges 2x2 in 1 x Per Day/30 Days Discharge Instructions: Apply over primary dressing as directed. Secured With: Insurance underwriter, Sterile 2x75 (in/in) 1 x Per Day/30 Days Discharge Instructions: Secure with stretch gauze as directed. Secured With: 64M Medipore H Soft Cloth Surgical T ape, 4 x 10 (in/yd) 1 x Per Day/30 Days Discharge Instructions: Secure with tape as directed. Wound #4 - Calcaneus Wound Laterality: Left, Posterior Cleanser: Soap and Water 1 x Per Week/30 Days Discharge Instructions: May shower and wash wound with dial antibacterial soap and water prior to dressing change. Cleanser: Vashe 5.8 (oz) 1 x Per Week/30 Days Discharge Instructions: Cleanse the wound with Vashe prior to applying a clean dressing using gauze sponges, not tissue or cotton balls. Peri-Wound Care: Sween Lotion (Moisturizing lotion) 1 x Per Week/30 Days Discharge Instructions: Apply moisturizing lotion as directed Prim Dressing: Maxorb Extra Ag+ Alginate Dressing, 4x4.75 (in/in) 1 x Per Week/30  Days ary Discharge Instructions: Apply to wound bed as instructed Secondary Dressing: ALLEVYN Heel 4 1/2in x 5 1/2in / 10.5cm x 13.5cm 1 x Per Week/30 Days Discharge Instructions: Apply over primary dressing as directed. Secondary Dressing: Woven Gauze Sponge, Non-Sterile 4x4 in 1 x Per Week/30 Days Discharge Instructions: Apply over primary dressing as directed. Compression Wrap: Urgo K2 Lite, (equivalent to a 3 layer) two layer compression system, regular 1 x Per Week/30 Days Discharge Instructions: Apply Urgo K2 Lite as directed (alternative to 3 layer compression). Wound #5 - T Fourth oe Wound Laterality: Left Cleanser: Vashe 5.8 (oz) 1 x Per Day/30 Days Discharge Instructions: Cleanse the wound with Vashe prior to applying a clean dressing using gauze sponges, not tissue or cotton balls. Cleanser: Wound Cleanser 1 x Per Day/30 Days Discharge Instructions: Cleanse the wound with wound cleanser prior to applying a clean dressing using gauze sponges, not tissue or cotton balls. [name], [name] ([id number]) [id number] Page 8 of 15 Topical: Gentamicin 1 x Per Day/30 Days Discharge Instructions: As directed by physician Topical: Mupirocin Ointment 1 x Per Day/30 Days Discharge Instructions: Apply Mupirocin (Bactroban) as instructed Secondary Dressing: Woven Gauze Sponges 2x2 in 1 x Per Day/30 Days Discharge Instructions: Apply over primary dressing as directed. Secured With: Insurance underwriter, Sterile 2x75 (in/in) 1 x Per Day/30 Days Discharge Instructions: Secure with stretch gauze as directed. Secured With: 64M Medipore H Soft Cloth Surgical T ape, 4 x 10 (in/yd) 1 x Per Day/30 Days Discharge Instructions: Secure with tape as directed. Radiology X-ray, foot - x-ray right great toe related to non healing diabetic foot ulcer. looking for osteomyelitis - (ICD10 E11.621 -  Type 2 diabetes mellitus with foot ulcer) Patient Medications llergies:  atorvastatin, lisinopril A Notifications Medication Indication Start End 11/06/2022 gentamicin DOSE topical 0.1 % ointment - Apply thin layer to great toe wound as directed with dressing changes 11/06/2022 mupirocin DOSE topical 2 % ointment - Apply thin layer to great toe wound with dressing changes as directed 11/06/2022 amoxicillin-pot clavulanate DOSE oral 875 mg-125 mg tablet - 1 tab p.o. twice daily x 10 days Electronic Signature(s) Signed: 11/06/2022 12:35:56 PM By: Duanne Guess MD FACS Previous Signature: 11/06/2022 11:14:43 AM Version By: Duanne Guess MD FACS Entered By: Duanne Guess on 11/06/2022 08:14:58 Prescription 11/06/2022 -------------------------------------------------------------------------------- Rosalie Gums MD Patient Name: Provider: [birth year] [id number] Date of Birth: NPI#: [id number] Sex: DEA #: [phone] Phone #: License #: UPN: Patient Address: [address] [place] [facility] [place], [place] [address] [address] [place], [place] [address] [phone] Allergies atorvastatin; lisinopril Provider's Orders X-ray, foot - ICD10: E11.621 - x-ray right great toe related to non healing diabetic foot ulcer. looking for osteomyelitis Hand Signature: Date(s): Electronic Signature(s) Signed: 11/06/2022 11:17:58 AM By: Duanne Guess MD FACS [name] ([id number]) [id number] Page 9 of 15 Entered By: Duanne Guess on 11/06/2022 08:17:58 -------------------------------------------------------------------------------- Problem List Details Patient Name: Date of Service: [name], [name] 11/06/2022 8:00 A M Medical Record Number: [id number] Patient Account Number: [id number] Date of Birth/Sex: Treating RN: 23-Nov-1945 (77 y.o. 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 11/06/2022 No Yes L97.222 Non-pressure chronic ulcer of left calf with fat layer exposed 11/06/2022 No Yes L97.812 Non-pressure chronic ulcer of other part of right lower leg with fat layer 11/06/2022 No Yes exposed L97.522 Non-pressure chronic ulcer of other part of left foot with fat layer exposed 11/06/2022 No Yes E11.621 Type 2 diabetes mellitus with foot ulcer 11/06/2022 No Yes E11.622 Type 2 diabetes mellitus with other skin ulcer 11/06/2022 No Yes 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) 11/06/2022 No Yes Inactive Problems Resolved Problems Electronic Signature(s) Signed: 11/06/2022 10:52:42 AM By: Duanne Guess MD FACS Previous Signature: 11/06/2022 8:17:10 AM Version By: Duanne Guess MD FACS Entered By: Duanne Guess on 11/06/2022 07:52:42 [name] ([id number]) [id number] Page 10 of 15 -------------------------------------------------------------------------------- Progress Note Details Patient Name: Date of Service: [name], [name] 11/06/2022 8:00 A M Medical Record Number: [id number] Patient Account Number: [id number] Date of Birth/Sex: Treating RN: 02-09-1946 (77 y.o. M) Primary Care Provider: Sanjuan Dame Other Clinician: Referring Provider: Treating Provider/Extender: Floyce Stakes in Treatment: 0 Subjective Chief Complaint Information obtained from Patient Patients presents for treatment of multiple open diabetic ulcers on the feet and lower legs History of Present Illness (HPI) ADMISSION 11/06/2022 ***ABIs LEFT 1.11; RIGHT 1.17*** This is a  77 year old well-controlled diabetic (last hemoglobin A1c 6.2%) who presents for multiple diabetic foot ulcers and leg ulcers. It appears that he has been receiving some treatment through the [facility] system, based on review of the electronic medical record. Unfortunately, the patient is an extremely poor historian and his recollection of timelines and interventions is somewhat scattered. He has  an ulcer on his left heel, left posterior calf, left fourth toe, right great toe, right anterior tibia. Based upon what he had on his wounds when he came to clinic, he has been applying Xeroform to everything. Patient History Information obtained from Patient, Chart. Allergies atorvastatin, lisinopril Family History Diabetes - Mother,Siblings, Heart Disease - Father, Hypertension - Mother. Social History Never smoker, Marital Status - Married, Alcohol Use - Never, Drug Use - No History, Caffeine Use - Never. Medical History Cardiovascular Patient has history of Arrhythmia - A. fib, Hypertension, Vasculitis - Leukocytoclastic vasculitis (HCC) Endocrine Patient has history of Type II Diabetes - hgbA1c 6 Patient is treated with Controlled Diet. Blood sugar is tested. Hospitalization/Surgery History - cardioversion. Medical A Surgical History Notes nd Respiratory pulmonary HTN Cardiovascular mitral regurgitation Review of Systems (ROS) Integumentary (Skin) Complains or has symptoms of Wounds - BLE. Objective Constitutional Slightly hypertensive. No acute distress. Vitals Time Taken: 8:26 AM, Height: 73 in, Source: Stated, Weight: 192 lbs, Source: Stated, BMI: 25.3, Temperature: 97.8 F, Pulse: 82 bpm, Respiratory Rate: 20 breaths/min, Blood Pressure: 144/93 mmHg, Capillary Blood Glucose: 128 mg/dl. Respiratory Normal work of breathing on room air. Cardiovascular Changes consistent with chronic venous insufficiency; stasis dermatitis.. General Notes: 11/06/2022: On his right  anterior tibial surface, there is a skin tear that involves the fat layer. His right great toe is massively swollen and erythematous. There are 2 separate openings into the tissue, 1 of which probes straight back and hit the nailbed from below. The ulcer on his left heel has a layer of dry eschar on it. There is a callus on his left fourth toe that has discoloration underneath. Once the callus was removed, there was an open wound discovered. On his left posterior calf, there is a vertical ulcer that he says was substantially larger in the past. There is a layer of slough on the surface. [name], [name] ([id number]) [id number] Page 11 of 15 Integumentary (Hair, Skin) Wound #1 status is Open. Original cause of wound was Gradually Appeared. The date acquired was: 08/15/2022. The wound is located on the Left,Posterior Lower Leg. The wound measures 4.2cm length x 1.2cm width x 0.1cm depth; 3.958cm^2 area and 0.396cm^3 volume. There is Fat Layer (Subcutaneous Tissue) exposed. There is no tunneling or undermining noted. There is a medium amount of serosanguineous drainage noted. The wound margin is distinct with the outline attached to the wound base. There is medium (34-66%) red granulation within the wound bed. There is a medium (34-66%) amount of necrotic tissue within the wound bed including Adherent Slough. The periwound skin appearance exhibited: Dry/Scaly, Hemosiderin Staining. The periwound skin appearance did not exhibit: Callus, Crepitus, Excoriation, Induration, Rash, Scarring, Maceration, Atrophie Blanche, Cyanosis, Ecchymosis, Mottled, Pallor, Rubor, Erythema. Wound #2 status is Open. Original cause of wound was Trauma. The date acquired was: 11/02/2022. The wound is located on the Right,Anterior Lower Leg. The wound measures 1.4cm length x 0.8cm width x 0.1cm depth; 0.88cm^2 area and 0.088cm^3 volume. There is no tunneling or undermining noted. There is a small amount  of serosanguineous drainage noted. The wound margin is distinct with the outline attached to the wound base. There is large (67-100%) red, pink granulation within the wound bed. There is no necrotic tissue within the wound bed. The periwound skin appearance did not exhibit: Callus, Crepitus, Excoriation, Induration, Rash, Scarring, Dry/Scaly, Maceration, Atrophie Blanche, Cyanosis, Ecchymosis, Hemosiderin Staining, Mottled, Pallor, Rubor, Erythema. Wound #3 status is Open. Original cause of wound was Gradually Appeared. The date  acquired was: 07/15/2022. The wound is located on the Right T Great. The oe wound measures 0.9cm length x 1.5cm width x 1.2cm depth; 1.06cm^2 area and 1.272cm^3 volume. There is bone and Fat Layer (Subcutaneous Tissue) exposed. There is no tunneling or undermining noted. There is a medium amount of serosanguineous drainage noted. The wound margin is distinct with the outline attached to the wound base. There is medium (34-66%) red granulation within the wound bed. There is a medium (34-66%) amount of necrotic tissue within the wound bed including Adherent Slough. The periwound skin appearance exhibited: Callus, Maceration, Erythema. The periwound skin appearance did not exhibit: Crepitus, Excoriation, Induration, Rash, Scarring, Dry/Scaly, Atrophie Blanche, Cyanosis, Ecchymosis, Hemosiderin Staining, Mottled, Pallor, Rubor. The surrounding wound skin color is noted with erythema which is circumferential with red streaks. Periwound temperature was noted as Hot. The periwound has tenderness on palpation. Wound #4 status is Open. Original cause of wound was Gradually Appeared. The date acquired was: 08/18/2022. The wound is located on the Left,Posterior Calcaneus. The wound measures 1cm length x 1.3cm width x 0.1cm depth; 1.021cm^2 area and 0.102cm^3 volume. There is Fat Layer (Subcutaneous Tissue) exposed. There is no tunneling or undermining noted. There is a medium amount of  serosanguineous drainage noted. The wound margin is distinct with the outline attached to the wound base. There is large (67-100%) red granulation within the wound bed. There is a small (1-33%) amount of necrotic tissue within the wound bed including Adherent Slough. The periwound skin appearance did not exhibit: Callus, Crepitus, Excoriation, Induration, Rash, Scarring, Dry/Scaly, Maceration, Atrophie Blanche, Cyanosis, Ecchymosis, Hemosiderin Staining, Mottled, Pallor, Rubor, Erythema. Wound #5 status is Open. Original cause of wound was Pressure Injury. The date acquired was: 11/06/2022. The wound is located on the Left T Fourth. The oe wound measures 0.5cm length x 0.5cm width x 0.1cm depth; 0.196cm^2 area and 0.02cm^3 volume. There is Fat Layer (Subcutaneous Tissue) exposed. There is no tunneling or undermining noted. There is a medium amount of serosanguineous drainage noted. The wound margin is distinct with the outline attached to the wound base. There is large (67-100%) red granulation within the wound bed. There is no necrotic tissue within the wound bed. The periwound skin appearance exhibited: Callus. The periwound skin appearance did not exhibit: Crepitus, Excoriation, Induration, Rash, Scarring, Dry/Scaly, Maceration, Atrophie Blanche, Cyanosis, Ecchymosis, Hemosiderin Staining, Mottled, Pallor, Rubor, Erythema. 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 There was a Excisional Skin/Subcutaneous Tissue Debridement with a total area of 3.96 sq cm performed by Duanne Guess, MD. With the following instrument(s): Curette to remove Viable and Non-Viable tissue/material. Material removed includes Subcutaneous Tissue, Slough, Skin: Dermis, and Skin: Epidermis after achieving pain control using Lidocaine 4% Topical Solution. A time out was conducted at 09:00, prior to the start of the procedure. A Minimum amount of bleeding was controlled with Pressure. The procedure was tolerated well  with a pain level of 0 throughout and a pain level of 0 following the procedure. Post Debridement Measurements: 4.2cm length x 1.2cm width x 0.1cm depth; 0.396cm^3 volume. Character of Wound/Ulcer Post Debridement is improved. Severity of Tissue Post Debridement is: Fat layer exposed. Post procedure Diagnosis Wound #1: Same as Pre-Procedure Pre-procedure diagnosis of Wound #1 is a Venous Leg Ulcer located on the Left,Posterior Lower Leg . There was a Double Layer Compression Therapy Procedure by Shawn Stall, RN. Post procedure Diagnosis Wound #1: Same as Pre-Procedure Wound #2 Pre-procedure diagnosis of Wound #2 is a Skin T located on the Right,Anterior Lower Leg . There was a Selective/Open Wound Non-Viable Tissue ear Debridement with a total area of 0.88 sq cm performed by Duanne Guess, MD. With the following instrument(s): Curette to remove Non-Viable tissue/material. Material removed includes Eschar and Slough and after achieving pain control using Lidocaine 4% Topical Solution. A time out was conducted at 09:00, prior to the start of the procedure. A Minimum amount of bleeding was controlled with Pressure. The procedure was tolerated well with a pain level of 0 throughout and a pain level of 0 following the procedure.  Post Debridement Measurements: 1.4cm length x 0.8cm width x 0.1cm depth; 0.088cm^3 volume. Character of Wound/Ulcer Post Debridement is improved. Post procedure Diagnosis Wound #2: Same as Pre-Procedure Pre-procedure diagnosis of Wound #2 is a Skin T located on the Right,Anterior Lower Leg . There was a Double Layer Compression Therapy Procedure by ear Shawn Stall, RN. [name], [name] ([id number]) [id number] Page 12 of 15 Post procedure Diagnosis Wound #2: Same as Pre-Procedure Wound #3 Pre-procedure diagnosis of Wound #3 is a Diabetic Wound/Ulcer of the Lower Extremity located on the Right T Great .Severity of Tissue Pre Debridement is: oe Fat layer exposed. There was a Selective/Open Wound Skin/Epidermis Debridement with a total area of 1.06 sq cm performed by Duanne Guess, MD. With the following instrument(s): Curette to remove Viable and Non-Viable tissue/material. Material removed includes Slough, Skin: Dermis, and Skin: Epidermis after achieving pain control using Lidocaine 4% Topical Solution. A time out was conducted at 09:00, prior to the start of the procedure. A Minimum amount of bleeding was controlled with Pressure. The procedure was tolerated well with a pain level of 0 throughout and a pain level of 0 following the procedure. Post Debridement Measurements: 0.9cm length x 1.5cm width x 1cm depth; 1.06cm^3 volume. Character of Wound/Ulcer Post Debridement is improved. Severity of Tissue Post Debridement is: Fat layer exposed. Post procedure Diagnosis Wound #3: Same as Pre-Procedure Wound #4 Pre-procedure diagnosis of Wound #4 is a Diabetic Wound/Ulcer of the Lower Extremity located on the Left,Posterior Calcaneus .Severity of Tissue Pre Debridement is: Fat layer exposed. There was a Selective/Open Wound Non-Viable Tissue Debridement with a total area of 1.02 sq cm performed by Duanne Guess, MD. With the following instrument(s): Curette to  remove Non-Viable tissue/material. Material removed includes [facility] after achieving pain control using Lidocaine 4% T opical Solution. A time out was conducted at 09:00, prior to the start of the procedure. A Minimum amount of bleeding was controlled with Pressure. The procedure was tolerated well with a pain level of 0 throughout and a pain level of 0 following the procedure. Post Debridement Measurements: 1cm length x 1.3cm width x 0.1cm depth; 0.102cm^3 volume. Character of Wound/Ulcer Post Debridement is improved. Severity of Tissue Post Debridement is: Fat layer exposed. Post procedure Diagnosis Wound #4: Same as Pre-Procedure Pre-procedure diagnosis of Wound #4 is a Diabetic Wound/Ulcer of  the Lower Extremity located on the Left,Posterior Calcaneus . There was a Double Layer Compression Therapy Procedure by Shawn Stall, RN. Post procedure Diagnosis Wound #4: Same as Pre-Procedure Wound #5 Pre-procedure diagnosis of Wound #5 is a Diabetic Wound/Ulcer of the Lower Extremity located on the Left T Fourth .Severity of Tissue Pre Debridement is: oe Limited to breakdown of skin. There was a Selective/Open Wound Non-Viable Tissue Debridement with a total area of 0.2 sq cm performed by Duanne Guess, MD. With the following instrument(s): Curette to remove Non-Viable tissue/material. Material removed includes Callus and Slough and after achieving pain control using Lidocaine 4% T opical Solution. A time out was conducted at 09:00, prior to the start of the procedure. A Minimum amount of bleeding was controlled with Pressure. The procedure was tolerated well with a pain level of 0 throughout and a pain level of 0 following the procedure. Post Debridement Measurements: 0.5cm length x 0.5cm width x 0.1cm depth; 0.02cm^3 volume. Character of Wound/Ulcer Post Debridement is improved. Severity of Tissue Post Debridement is: Limited to breakdown of skin. Post procedure Diagnosis Wound #5: Same as  Pre-Procedure Plan Follow-up Appointments: Return Appointment in 1 week. - Dr .Lady Gary Return Appointment in 2 weeks. - Dr .Lady Gary Return appointment in 3 weeks. - Dr .Lady Gary Return appointment in 1 month. Other: - Pick up oral and topical antibiotics from your pharmacy. Please obtain x-ray of right great toe at any outpatient facility of [facility] - before you return next week. Anesthetic: (In clinic) Topical Lidocaine 4% applied to wound bed Bathing/ Shower/ Hygiene: May shower with protection but do not get wound dressing(s) wet. Protect dressing(s) with water repellant cover (for example, large plastic bag) or a cast cover and may then take shower. Edema Control - Lymphedema / SCD / Other: Elevate legs to the level of the heart or above for 30 minutes daily and/or when sitting for 3-4 times a day throughout the day. Avoid standing for long periods of time. Off-Loading: Open toe surgical shoe to: - peg assist shoe to right foot wear while walking and standing. Radiology ordered were: X-ray, foot - x-ray right great toe related to non healing diabetic foot ulcer. looking for osteomyelitis The following medication(s) was prescribed: gentamicin topical 0.1 % ointment Apply thin layer to great toe wound as directed with dressing changes starting 11/06/2022 mupirocin topical 2 % ointment Apply thin layer to great toe wound with dressing changes as directed starting 11/06/2022 amoxicillin-pot clavulanate oral 875 mg-125 mg tablet 1 tab p.o. twice daily x 10 days starting 11/06/2022 WOUND #1: - Lower Leg Wound Laterality: Left, Posterior Cleanser: Soap and Water 1 x Per Week/30 Days Discharge Instructions: May shower and wash wound with dial antibacterial soap and water prior to dressing change. Cleanser: Vashe 5.8 (oz) 1 x Per Week/30 Days Discharge Instructions: Cleanse the wound with Vashe prior to applying a clean dressing using gauze sponges, not tissue or cotton balls. Peri-Wound Care: Sween  Lotion (Moisturizing lotion) 1 x Per Week/30 Days Discharge Instructions: Apply moisturizing lotion as directed Prim Dressing: Maxorb Extra Ag+ Alginate Dressing, 4x4.75 (in/in) 1 x Per Week/30 Days ary Discharge Instructions: Apply to wound bed as instructed Secondary Dressing: ABD Pad, 8x10 1 x Per Week/30 Days Discharge Instructions: Apply over primary dressing as directed. Secondary Dressing: Woven Gauze Sponge, Non-Sterile 4x4 in 1 x Per Week/30 Days Discharge Instructions: Apply over primary dressing as directed. Com pression Wrap: Urgo K2 Lite, (equivalent to a 3 layer) two layer compression system, regular 1  x Per Week/30 Days Discharge Instructions: Apply Urgo K2 Lite as directed (alternative to 3 layer compression). WOUND #2: - Lower Leg Wound Laterality: Right, Anterior Cleanser: Soap and Water 1 x Per Week/30 Days Discharge Instructions: May shower and wash wound with dial antibacterial soap and water prior to dressing change. Cleanser: Vashe 5.8 (oz) 1 x Per Week/30 Days Discharge Instructions: Cleanse the wound with Vashe prior to applying a clean dressing using gauze sponges, not tissue or cotton balls. [name], [name] ([id number]) [id number] Page 13 of 15 Peri-Wound Care: Sween Lotion (Moisturizing lotion) 1 x Per Week/30 Days Discharge Instructions: Apply moisturizing lotion as directed Prim Dressing: Maxorb Extra Ag+ Alginate Dressing, 4x4.75 (in/in) 1 x Per Week/30 Days ary Discharge Instructions: Apply to wound bed as instructed Secondary Dressing: ABD Pad, 8x10 1 x Per Week/30 Days Discharge Instructions: Apply over primary dressing as directed. Secondary Dressing: Woven Gauze Sponge, Non-Sterile 4x4 in 1 x Per Week/30 Days Discharge Instructions: Apply over primary dressing as directed. Com pression Wrap: Urgo K2 Lite, (equivalent to a 3 layer) two layer compression system, regular 1 x Per Week/30 Days Discharge Instructions: Apply Urgo  K2 Lite as directed (alternative to 3 layer compression). WOUND #3: - T Great Wound Laterality: Right oe Cleanser: Vashe 5.8 (oz) 1 x Per Day/30 Days Discharge Instructions: Cleanse the wound with Vashe prior to applying a clean dressing using gauze sponges, not tissue or cotton balls. Cleanser: Wound Cleanser 1 x Per Day/30 Days Discharge Instructions: Cleanse the wound with wound cleanser prior to applying a clean dressing using gauze sponges, not tissue or cotton balls. Topical: Gentamicin 1 x Per Day/30 Days Discharge Instructions: As directed by physician Topical: Mupirocin Ointment 1 x Per Day/30 Days Discharge Instructions: Apply Mupirocin (Bactroban) as instructed Prim Dressing: Maxorb Extra Ag+ Alginate Dressing, 2x2 (in/in) 1 x Per Day/30 Days ary Discharge Instructions: Apply to wound bed as instructed Secondary Dressing: Optifoam Non-Adhesive Dressing, 4x4 in 1 x Per Day/30 Days Discharge Instructions: Apply over primary dressing foam donut Secondary Dressing: Woven Gauze Sponges 2x2 in 1 x Per Day/30 Days Discharge Instructions: Apply over primary dressing as directed. Secured With: Insurance underwriter, Sterile 2x75 (in/in) 1 x Per Day/30 Days Discharge Instructions: Secure with stretch gauze as directed. Secured With: 4M Medipore H Soft Cloth Surgical T ape, 4 x 10 (in/yd) 1 x Per Day/30 Days Discharge Instructions: Secure with tape as directed. WOUND #4: - Calcaneus Wound Laterality: Left, Posterior Cleanser: Soap and Water 1 x Per Week/30 Days Discharge Instructions: May shower and wash wound with dial antibacterial soap and water prior to dressing change. Cleanser: Vashe 5.8 (oz) 1 x Per Week/30 Days Discharge Instructions: Cleanse the wound with Vashe prior to applying a clean dressing using gauze sponges, not tissue or cotton balls. Peri-Wound Care: Sween Lotion (Moisturizing lotion) 1 x Per Week/30 Days Discharge Instructions: Apply moisturizing lotion as  directed Prim Dressing: Maxorb Extra Ag+ Alginate Dressing, 4x4.75 (in/in) 1 x Per Week/30 Days ary Discharge Instructions: Apply to wound bed as instructed Secondary Dressing: ALLEVYN Heel 4 1/2in x 5 1/2in / 10.5cm x 13.5cm 1 x Per Week/30 Days Discharge Instructions: Apply over primary dressing as directed. Secondary Dressing: Woven Gauze Sponge, Non-Sterile 4x4 in 1 x Per Week/30 Days Discharge Instructions: Apply over primary dressing as directed. Com pression Wrap: Urgo K2 Lite, (equivalent to a 3 layer) two layer compression system, regular 1 x Per Week/30 Days Discharge Instructions: Apply Urgo K2 Lite as directed (alternative to 3 layer  compression). WOUND #5: - T Fourth Wound Laterality: Left oe Cleanser: Vashe 5.8 (oz) 1 x Per Day/30 Days Discharge Instructions: Cleanse the wound with Vashe prior to applying a clean dressing using gauze sponges, not tissue or cotton balls. Cleanser: Wound Cleanser 1 x Per Day/30 Days Discharge Instructions: Cleanse the wound with wound cleanser prior to applying a clean dressing using gauze sponges, not tissue or cotton balls. Topical: Gentamicin 1 x Per Day/30 Days Discharge Instructions: As directed by physician Topical: Mupirocin Ointment 1 x Per Day/30 Days Discharge Instructions: Apply Mupirocin (Bactroban) as instructed Secondary Dressing: Woven Gauze Sponges 2x2 in 1 x Per Day/30 Days Discharge Instructions: Apply over primary dressing as directed. Secured With: Insurance underwriter, Sterile 2x75 (in/in) 1 x Per Day/30 Days Discharge Instructions: Secure with stretch gauze as directed. Secured With: 46M Medipore H Soft Cloth Surgical T ape, 4 x 10 (in/yd) 1 x Per Day/30 Days Discharge Instructions: Secure with tape as directed. 11/06/2022: This is a 77 year old diabetic who presents with multiple ulcers. On his right anterior tibial surface, there is a skin tear that involves the fat layer. His right great toe is massively  swollen and erythematous. There are 2 separate openings into the tissue, 1 of which probes straight back and hit the nailbed from below. The ulcer on his left heel has a layer of dry eschar on it. There is a callus on his left fourth toe that has discoloration underneath. Once the callus was removed, there was an open wound discovered. On his left posterior calf, there is a vertical ulcer that he says was substantially larger in the past. There is a layer of slough on the surface. I used a curette to debride slough and eschar from the right anterior tibial wound, slough from the right great toe wound, eschar and slough from the left heel wound, callus and slough from the left fourth toe, and slough and subcutaneous tissue from the left posterior calf wound. I am concerned for osteomyelitis in the right great toe and and going to send him for an x-ray to evaluate this. I have also empirically prescribed a course of Augmentin. We will apply topical gentamicin and mupirocin to this toe. We will apply silver alginate here and to all of his other wounds along with bilateral Urgo lite wraps. Follow-up in 1 week. Electronic Signature(s) Signed: 11/06/2022 11:16:24 AM By: Duanne Guess MD FACS Entered By: Duanne Guess on 11/06/2022 16:10:96 [name] ([id number]) [id number] Page 14 of 15 -------------------------------------------------------------------------------- HxROS Details Patient Name: Date of Service: [name], [name] 11/06/2022 8:00 A M Medical Record Number: [id number] Patient Account Number: [id number] Date of Birth/Sex: Treating RN: 1945/12/05 (77 y.o. [name] Primary Care Provider: Sanjuan Dame Other Clinician: Referring Provider: Treating Provider/Extender: Lelan Pons in Treatment: 0 Information Obtained From Patient Chart Integumentary (Skin) Complaints and Symptoms: Positive for: Wounds -  BLE Respiratory Medical History: Past Medical History Notes: pulmonary HTN Cardiovascular Medical History: Positive for: Arrhythmia - A. fib; Hypertension; Vasculitis - Leukocytoclastic vasculitis (HCC) Past Medical History Notes: mitral regurgitation Endocrine Medical History: Positive for: Type II Diabetes - hgbA1c 6 Time with diabetes: Dx 77 years old Treated with: Diet Blood sugar tested every day: Yes Tested : once Immunizations Pneumococcal Vaccine: Received Pneumococcal Vaccination: Yes Received Pneumococcal Vaccination On or After 60th Birthday: Yes Implantable Devices No devices added Hospitalization / Surgery History Type of Hospitalization/Surgery cardioversion Family and Social History Diabetes: Yes -  Mother,Siblings; Heart Disease: Yes - Father; Hypertension: Yes - Mother; Never smoker; Marital Status - Married; Alcohol Use: Never; Drug Use: No History; Caffeine Use: Never; Financial Concerns: No; Food, Clothing or Shelter Needs: No; Support System Lacking: No; Transportation Concerns: No Electronic Signature(s) Signed: 11/06/2022 12:07:50 PM By: Shawn Stall RN, BSN Signed: 11/09/2022 4:36:31 PM By: Geralyn Corwin DO Entered By: Shawn Stall on 11/06/2022 05:37:10 -------------------------------------------------------------------------------- SuperBill Details Patient Name: Date of Service: [name], [name] 11/06/2022 Medical Record Number: [id number] Patient Account Number: [id number] Date of Birth/Sex: Treating RN: 04/30/1945 (77 y.o. 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 mellitus with foot ulcer E11.622 Type 2 diabetes mellitus with other skin ulcer 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) Facility Procedures : CPT4 Code: 21308657 Description: 99213 - WOUND CARE VISIT-LEV 3 EST PT Modifier: Quantity: 1 : CPT4 Code: 84696295 Description: 11042 - DEB SUBQ TISSUE 20 SQ CM/< ICD-10 Diagnosis Description L97.222 Non-pressure chronic ulcer of left calf with fat layer exposed Modifier: Quantity: 1 : CPT4 Code: 28413244 Description: 97597 - DEBRIDE WOUND 1ST 20 SQ CM OR < ICD-10 Diagnosis Description L97.512 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  L97.812 Non-pressure chronic ulcer of other part of right lower leg with fat layer expos L97.522 Non-pressure chronic ulcer of other part of left foot with fat layer exposed Modifier: ed Quantity: 1 Physician Procedures : CPT4 Code Description Modifier [id number] 99204 - WC PHYS LEVEL 4 - NEW PT 25 ICD-10 Diagnosis Description L97.512 Non-pressure chronic ulcer of other part of right foot with fat layer exposed L97.422 Non-pressure chronic ulcer of left heel and midfoot  with fat layer exposed 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 Quantity: 1 : [id number] 11042 - WC PHYS SUBQ TISS 20 SQ CM ICD-10 Diagnosis Description L97.222 Non-pressure chronic ulcer of left calf with fat layer exposed Quantity: 1 : [id number] 97597 - WC PHYS DEBR WO ANESTH  20 SQ CM ICD-10 Diagnosis Description 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

## 2022-11-11 ENCOUNTER — Other Ambulatory Visit (HOSPITAL_COMMUNITY): Payer: Self-pay | Admitting: General Surgery

## 2022-11-11 ENCOUNTER — Ambulatory Visit (HOSPITAL_COMMUNITY)
Admission: RE | Admit: 2022-11-11 | Discharge: 2022-11-11 | Disposition: A | Discharge status: Home / Self Care | Payer: No Typology Code available for payment source | Source: Ambulatory Visit | Attending: General Surgery | Admitting: General Surgery

## 2022-11-11 DIAGNOSIS — E11621 Type 2 diabetes mellitus with foot ulcer: Secondary | ICD-10-CM

## 2022-11-11 DIAGNOSIS — L97509 Non-pressure chronic ulcer of other part of unspecified foot with unspecified severity: Secondary | ICD-10-CM | POA: Diagnosis present

## 2022-11-11 DIAGNOSIS — M869 Osteomyelitis, unspecified: Secondary | ICD-10-CM | POA: Diagnosis present

## 2022-11-11 DIAGNOSIS — E1169 Type 2 diabetes mellitus with other specified complication: Secondary | ICD-10-CM | POA: Insufficient documentation

## 2022-11-11 DIAGNOSIS — L97519 Non-pressure chronic ulcer of other part of right foot with unspecified severity: Secondary | ICD-10-CM | POA: Diagnosis not present

## 2022-11-13 ENCOUNTER — Encounter (HOSPITAL_BASED_OUTPATIENT_CLINIC_OR_DEPARTMENT_OTHER): Payer: No Typology Code available for payment source | Admitting: General Surgery

## 2022-11-13 DIAGNOSIS — E11621 Type 2 diabetes mellitus with foot ulcer: Secondary | ICD-10-CM | POA: Diagnosis not present

## 2022-11-13 NOTE — Progress Notes (Signed)
[name], [name] ([id number]) [id number] Page 1 of 17 Visit Report for 11/13/2022 Arrival Information Details Patient Name: Date of Service: [name] CE, RO GER W. 11/13/2022 10:30 A M Medical Record Number: [id number] Patient Account Number: [id number] Date of Birth/Sex: Treating RN: 1945/08/25 (77 y.o. [name] Primary Care [name]: [name] Other Clinician: Referring [name]: Treating  [name]/Extender: [name] in Treatment: 1 Visit Information History Since Last Visit Added or deleted any medications: No Patient Arrived: Ambulatory Any new allergies or adverse reactions: No Arrival Time: 10:25 Had a fall or experienced change in No Accompanied By: self activities of daily living that may affect Transfer Assistance: None risk of falls: Patient Identification Verified: Yes Signs or symptoms of abuse/neglect since last visito No Secondary Verification Process Completed: Yes Hospitalized since last visit: No Patient Requires Transmission-Based Precautions: No Implantable device outside of the clinic excluding No Patient Has Alerts: Yes cellular tissue based products placed in the center Patient Alerts: Patient on Blood Thinner since last visit: Has Dressing in Place as Prescribed: Yes Has Compression in Place as Prescribed: Yes Pain Present Now: No Electronic Signature(s) Signed: 11/13/2022 12:07:40 PM By: Samuella Bruin Entered By: Samuella Bruin on 11/13/2022 07:26:14 -------------------------------------------------------------------------------- Compression Therapy Details Patient Name: Date of Service: [name] CE, RO GER W. 11/13/2022 10:30 A M Medical Record Number: [id number] Patient Account Number: [id number] Date of Birth/Sex: Treating RN: [birth year] (77 y.o. [name] Primary Care [name]: [name] Other Clinician: Referring Kanton Kamel: Treating  [name]/Extender: [name] in Treatment: 1 Compression Therapy Performed for Wound Assessment: Wound #1 Left,Posterior Lower Leg Performed By: Clinician Samuella Bruin, RN Compression Type: Double Layer Post Procedure Diagnosis Same as Pre-procedure Electronic Signature(s) Signed: 11/13/2022 12:07:40 PM By: Gelene Mink By: Samuella Bruin on 11/13/2022 08:01:58 [name] ([id number]) [id number] Page 2 of 17 -------------------------------------------------------------------------------- Compression Therapy Details Patient Name: Date of Service: [name] CE, RO GER W. 11/13/2022 10:30 A M Medical Record Number: [id number] Patient Account Number: [id number] Date of Birth/Sex: Treating RN: Oct 18, 1945 (77 y.o. [name] Primary Care [name]: [name] Other Clinician: Referring [name]: Treating [name]/Extender: [name] in Treatment: 1 Compression Therapy Performed for Wound Assessment: Wound #2 Right,Anterior Lower Leg Performed By: Clinician Samuella Bruin, RN Compression Type: Double Layer Post Procedure Diagnosis Same as Pre-procedure Electronic Signature(s) Signed: 11/13/2022 12:07:40 PM By: Gelene Mink By: Samuella Bruin on 11/13/2022 08:01:58 -------------------------------------------------------------------------------- Encounter Discharge Information Details Patient Name: Date of Service: [name] CE, RO GER W. 11/13/2022 10:30 A M Medical Record Number: [id number] Patient Account Number: [id number] Date of Birth/Sex: Treating RN: 09-08-1945 (77 y.o. [name] Primary Care Maragret Norris: [name] Other Clinician: Referring [name]: Treating [name]/Extender: [name] in Treatment: 1 Encounter Discharge Information Items Post Procedure Vitals Discharge  Condition: Stable Temperature (F): 97.8 Ambulatory Status: Ambulatory Pulse (bpm): 74 Discharge Destination: Home Respiratory Rate (breaths/min): 18 Transportation: Private Auto Blood Pressure (mmHg): 138/63 Accompanied By: self Schedule Follow-up Appointment: Yes Clinical Summary of Care: Patient Declined Electronic Signature(s) Signed: 11/13/2022 12:07:40 PM By: Samuella Bruin Entered By: Samuella Bruin on 11/13/2022 08:32:32 -------------------------------------------------------------------------------- Lower Extremity Assessment Details Patient Name: Date of Service: [name] CE, RO GER W. 11/13/2022 10:30 A M Medical Record Number: [id number] Patient Account Number: [id number] Date of Birth/Sex: Treating RN: 12-19-1945 (77 y.o. [name] Primary Care [name]: [name] Other Clinician:  Referring [name]: Treating [name]/Extender: [name] in Treatment: 1 Edema Assessment Assessed: [Left: No] [Right: No] Edema: [Left: Yes] [Right: Yes] Calf [name] ([id number]) [id number] Page 3 of 17 Left: Right: Point of Measurement: 46 cm From Medial Instep 34 cm 33.8 cm Ankle Left: Right: Point of Measurement: 13 cm From Medial Instep 24 cm 24.5 cm Vascular Assessment Pulses: Dorsalis Pedis Palpable: [Left:Yes] [Right:Yes] Extremity colors, hair growth, and conditions: Extremity Color: [Left:Hyperpigmented] [Right:Hyperpigmented] Hair Growth on Extremity: [Left:No] [Right:No] Temperature of Extremity: [Left:Hot] [Right:Warm] Capillary Refill: [Left:< 3 seconds] [Right:< 3 seconds] Dependent Rubor: [Left:No Yes] [Right:No Yes] Electronic Signature(s) Signed: 11/13/2022 12:07:40 PM By: Samuella Bruin Entered By: Samuella Bruin on 11/13/2022 07:41:36 -------------------------------------------------------------------------------- Multi Wound Chart Details Patient Name: Date  of Service: [name] CE, RO GER W. 11/13/2022 10:30 A M Medical Record Number: [id number] Patient Account Number: [id number] Date of Birth/Sex: Treating RN: 09-Apr-1945 (77 y.o. 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 Etiology: Extremity Arrhythmia, Hypertension, Vasculitis, Arrhythmia, Hypertension, Vasculitis, Arrhythmia, Hypertension, Vasculitis, Comorbid History: Type II Diabetes Type II Diabetes Type II Diabetes 08/15/2022 11/02/2022 07/15/2022 Date Acquired: 1 1 1  Weeks of Treatment: Open Open Open Wound Status: No No No Wound Recurrence: Yes No No Clustered Wound: 2 N/A N/A Clustered Quantity: 1.1x0.5x0.1 1.1x1.1x0.1 0.3x1.1x1.2 Measurements L x W x D (cm) 0.432 0.95 0.259 A (cm) : rea 0.043 0.095 0.311 Volume (cm) : 89.10% -8.00% 75.60% % Reduction in Area: [name], [name] ([id number]) [phone].pdf Page 4 of 17 89.10% -8.00% 75.60% % Reduction in Volume: Full Thickness Without Exposed Partial Thickness Grade 2 Classification: Support Structures Medium Small Medium Exudate A mount: Serosanguineous Serosanguineous Serosanguineous Exudate Type: red, brown red, brown red, brown Exudate Color: Distinct, outline attached Distinct, outline attached Distinct, outline attached Wound Margin: Large (67-100%) Large (67-100%) Medium (34-66%) Granulation A mount: Red Red, Pink Red Granulation Quality: Small (1-33%) None  Present (0%) Medium (34-66%) Necrotic A mount: Eschar, Adherent Slough N/A Adherent Slough Necrotic Tissue: Fat Layer (Subcutaneous Tissue): Yes Fat Layer (Subcutaneous Tissue): Yes Fat Layer (Subcutaneous Tissue): Yes Exposed Structures: Fascia: No Fascia: No Bone: Yes Tendon: No Tendon: No Fascia: No Muscle: No Muscle: No Tendon: No Joint: No Joint: No Muscle: No Bone: No Bone: No Joint: No Small (1-33%) Small (1-33%) Small (1-33%) Epithelialization: Debridement - Selective/Open Wound Debridement - Selective/Open Wound Debridement - Excisional Debridement: Pre-procedure Verification/Time Out 10:54 10:54 10:54 Taken: Lidocaine 4% Topical Solution Lidocaine 4% Topical Solution Lidocaine 4% Topical Solution Pain Control: Necrotic/Eschar Slough Subcutaneous, Slough Tissue Debrided: Non-Viable Tissue Non-Viable Tissue Skin/Subcutaneous Tissue Level: 0.43 0.95 0.26 Debridement A (sq cm): rea Curette Curette Curette Instrument: Minimum Minimum Minimum Bleeding: Pressure Pressure Pressure Hemostasis A chieved: Procedure was tolerated well Procedure was tolerated well Procedure was tolerated well Debridement Treatment Response: 1.1x0.5x0.1 1.1x1.1x0.1 0.3x1.1x1.2 Post Debridement Measurements L x W x D (cm) 0.043 0.095 0.311 Post Debridement Volume: (cm) Excoriation: No Excoriation: No Callus: Yes Periwound Skin Texture: Induration: No Induration: No Excoriation: No Callus: No Callus: No Induration: No Crepitus: No Crepitus: No Crepitus: No Rash: No Rash: No Rash: No Scarring:  No Scarring: No Scarring: No Dry/Scaly: Yes Maceration: No Maceration: Yes Periwound Skin Moisture: Maceration: No Dry/Scaly: No Dry/Scaly: No Hemosiderin Staining: Yes Hemosiderin Staining: Yes Erythema: Yes Periwound Skin Color: Atrophie Blanche: No Atrophie Blanche: No Atrophie Blanche: No Cyanosis: No Cyanosis: No Cyanosis: No Ecchymosis: No Ecchymosis:  No Ecchymosis: No Erythema: No Erythema: No Hemosiderin Staining: No Mottled: No Mottled: No Mottled: No Pallor: No Pallor: No Pallor: No Rubor: No Rubor: No Rubor: No N/A N/A Circumferential, Red Streaks Erythema Location: N/A No Abnormality No Abnormality Temperature: Chemical Cauterization Compression Therapy Debridement Procedures Performed: Compression Therapy Debridement Debridement Wound Number: [id number]  Photos: No Photos Left, Posterior Calcaneus Left T Fourth oe Left Metatarsal head first Wound Location: Gradually Appeared Pressure Injury Gradually Appeared Wounding Event: 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 Primary Etiology: Extremity Extremity Extremity Arrhythmia, Hypertension, Vasculitis, Arrhythmia, Hypertension, Vasculitis, Arrhythmia, Hypertension, Vasculitis, Comorbid History: Type II Diabetes Type II Diabetes Type II Diabetes 08/18/2022 11/06/2022 11/13/2022 Date Acquired: 1 1 0 Weeks of Treatment: Open Open Open Wound Status: No No No Wound Recurrence: No No No Clustered Wound: N/A N/A N/A Clustered Quantity: 0.6x0.7x0.1 0.4x0.4x0.1 0.5x0.2x0.1 Measurements L x W x D (cm) 0.33 0.126 0.079 A (cm) : rea 0.033 0.013 0.008 Volume (cm) : 67.70% 35.70% N/A % Reduction in A rea: 67.60% 35.00% N/A % Reduction in Volume: Grade 1 Grade 1 Grade 1 Classification: Medium None Present Medium Exudate A mount: Serosanguineous N/A Serosanguineous Exudate Type: red, brown N/A red, brown Exudate ColorRAISTLIN, [name] ([id number]) [id number] Page 5 of 17 Distinct, outline attached Distinct, outline attached Distinct, outline attached Wound Margin: Large (67-100%) Small (1-33%) Small (1-33%) Granulation Amount: Red Red Red Granulation Quality: Small (1-33%) Large (67-100%) Large (67-100%) Necrotic Amount: Adherent Slough Eschar Eschar Necrotic Tissue: Fat  Layer (Subcutaneous Tissue): Yes Fat Layer (Subcutaneous Tissue): Yes Fat Layer (Subcutaneous Tissue): Yes Exposed Structures: Fascia: No Fascia: No Fascia: No Tendon: No Tendon: No Tendon: No Muscle: No Muscle: No Muscle: No Joint: No Joint: No Joint: No Bone: No Bone: No Bone: No Medium (34-66%) Small (1-33%) None Epithelialization: Debridement - Selective/Open Wound Debridement - Selective/Open Wound Debridement - Selective/Open Wound Debridement: Pre-procedure Verification/Time Out 10:54 10:54 10:54 Taken: Lidocaine 4% Topical Solution Lidocaine 4% T opical Solution Lidocaine 4% Topical Solution Pain Control: Slough Callus, Slough Callus Tissue Debrided: Non-Viable Tissue Skin/Epidermis Non-Viable Tissue Level: 0.33 0.13 0.08 Debridement A (sq cm): rea Curette Curette Curette Instrument: Minimum Minimum Minimum Bleeding: Pressure Pressure Pressure Hemostasis A chieved: Procedure was tolerated well Procedure was tolerated well Procedure was tolerated well Debridement Treatment Response: 0.6x0.7x0.1 0.4x0.4x0.1 0.5x0.2x0.1 Post Debridement Measurements L x W x D (cm) 0.033 0.013 0.008 Post Debridement Volume: (cm) Excoriation: No Callus: Yes Callus: Yes Periwound Skin Texture: Induration: No Excoriation: No Callus: No Induration: No Crepitus: No Crepitus: No Rash: No Rash: No Scarring: No Scarring: No Maceration: No Maceration: No No Abnormalities Noted Periwound Skin Moisture: Dry/Scaly: No Dry/Scaly: No Atrophie Blanche: No Atrophie Blanche: No No Abnormalities Noted Periwound Skin Color: Cyanosis: No Cyanosis: No Ecchymosis: No Ecchymosis: No Erythema: No Erythema: No Hemosiderin Staining: No Hemosiderin Staining: No Mottled: No Mottled: No Pallor: No Pallor: No Rubor: No Rubor: No N/A N/A N/A Erythema Location: N/A N/A No Abnormality Temperature: Debridement Debridement Debridement Procedures Performed: Treatment  Notes Electronic Signature(s) Signed: 11/13/2022 11:04:42 AM By: Duanne Guess MD FACS Entered By: Duanne Guess on 11/13/2022 08:04:42 -------------------------------------------------------------------------------- Multi-Disciplinary Care Plan Details Patient Name: Date of  Service: [name] CE, RO GER W. 11/13/2022 10:30 A M Medical Record Number: [id number] Patient Account Number: [id number] Date of Birth/Sex: Treating RN: January 27, 1946 (77 y.o. [name] Primary Care Elmire Amrein: [name] Other Clinician: Referring Mamta Rimmer: Treating Nusaiba Guallpa/Extender: [name] in Treatment: 1 Active Inactive Nutrition Nursing Diagnoses: Potential for alteratiion in Nutrition/Potential for imbalanced nutrition Goals: Patient/caregiver agrees to and verbalizes understanding of need to obtain nutritional consultation [name], [name] ([id number]) [id number] Page 6 of 17 Date Initiated: 11/06/2022 Target Resolution Date: 11/19/2022 Goal Status: Active Patient/caregiver will maintain therapeutic glucose control Date Initiated: 11/06/2022 Target Resolution Date: 11/19/2022 Goal Status: Active Interventions: Assess HgA1c results as ordered upon admission and as needed Provide education on elevated blood sugars and impact on wound healing Provide education on nutrition Treatment Activities: Patient referred to Primary Care Physician for further nutritional evaluation : 11/06/2022 Notes: Orientation to the Wound Care Program Nursing Diagnoses: Knowledge deficit related to the wound healing center program Goals: Patient/caregiver will verbalize understanding of the Wound Healing Center Program Date Initiated: 11/06/2022 Target Resolution Date: 11/20/2022 Goal Status: Active Interventions: Provide education on orientation to the wound center Notes: Pain, Acute or Chronic Nursing Diagnoses: Potential alteration in comfort,  pain Goals: Patient will verbalize adequate pain control and receive pain control interventions during procedures as needed Date Initiated: 11/06/2022 Target Resolution Date: 11/19/2022 Goal Status: Active Patient/caregiver will verbalize comfort level met Date Initiated: 11/06/2022 Target Resolution Date: 11/19/2022 Goal Status: Active Interventions: Encourage patient to take pain medications as prescribed Provide education on pain management Reposition patient for comfort Treatment Activities: Administer pain control measures as ordered : 11/06/2022 Notes: Wound/Skin Impairment Nursing Diagnoses: Knowledge deficit related to ulceration/compromised skin integrity Goals: Patient/caregiver will verbalize understanding of skin care regimen Date Initiated: 11/06/2022 Target Resolution Date: 12/18/2022 Goal Status: Active Interventions: Assess patient/caregiver ability to perform ulcer/skin care regimen upon admission and as needed Assess ulceration(s) every visit Provide education on ulcer and skin care Treatment Activities: Skin care regimen initiated : 11/06/2022 Topical wound management initiated : 11/06/2022 Notes: Electronic Signature(s) Signed: 11/13/2022 12:07:40 PM By: Loney Laurence ([id number]) [id number] Page 7 of 17 Entered By: Samuella Bruin on 11/13/2022 08:30:45 -------------------------------------------------------------------------------- Pain Assessment Details Patient Name: Date of Service: [name] CE, RO GER W. 11/13/2022 10:30 A M Medical Record Number: [id number] Patient Account Number: [id number] Date of Birth/Sex: Treating RN: 1945/03/19 (77 y.o. [name] Primary Care Lulamae Skorupski: [name] Other Clinician: Referring Loanne Emery: Treating Demondre Aguas/Extender: [name] in Treatment: 1 Active Problems Location of Pain Severity and Description of Pain Patient Has  Paino No Site Locations Rate the pain. Current Pain Level: 0 Pain Management and Medication Current Pain Management: Electronic Signature(s) Signed: 11/13/2022 12:07:40 PM By: Samuella Bruin Entered By: Samuella Bruin on 11/13/2022 07:26:23 -------------------------------------------------------------------------------- Patient/Caregiver Education Details Patient Name: Date of Service: [name] CE, RO GER W. 8/30/2024andnbsp10:30 A M Medical Record Number: [id number] Patient Account Number: [id number] Date of Birth/Gender: Treating RN: 03-May-1945 (77 y.o. [name] Primary Care Physician: [name] Other Clinician: Referring Physician: Treating Physician/Extender: [name] in Treatment: 1 Education Assessment Education Provided To: Patient Education Topics Provided Wound/Skin ImpairmentLYRIQ, MARCIA ([id number]) [id number] Page 8 of 17 Methods: Explain/Verbal Responses: Reinforcements needed, State content correctly Electronic Signature(s) Signed: 11/13/2022 12:07:40 PM By: Samuella Bruin Entered By: Samuella Bruin on 11/13/2022 08:31:50 -------------------------------------------------------------------------------- Wound Assessment Details Patient Name: Date of Service: [name] CE, RO GER W. 11/13/2022 10:30 A M Medical Record Number: [id number] Patient Account Number: [id number] Date of Birth/Sex: Treating RN: July 01, 1945 (77 y.o. [name] Primary Care Shiela Bruns: [name] Other Clinician: Referring Sampson Self: Treating [place] Duquette/Extender: [name] in Treatment: 1 Wound Status Wound Number: 1 Primary Etiology: Venous Leg Ulcer Wound Location: Left, Posterior Lower Leg Wound Status: Open Wounding Event: Gradually Appeared Comorbid History: Arrhythmia, Hypertension, Vasculitis, Type II Diabetes Date Acquired:  08/15/2022 Weeks Of Treatment: 1 Clustered Wound: Yes Photos Wound Measurements Length: (cm) Width: (cm) Depth: (cm) Clustered Quantity: Area: (cm) Volume: (cm) 1.1 % Reduction in Area: 89.1% 0.5 % Reduction in Volume: 89.1% 0.1 Epithelialization: Small (1-33%) 2 Tunneling: No 0.432 Undermining: No 0.043 Wound Description Classification: Full Thickness Without Exposed Supp Wound Margin: Distinct, outline attached Exudate Amount: Medium Exudate Type: Serosanguineous Exudate Color: red, brown ort Structures Foul Odor After Cleansing: No Slough/Fibrino Yes Wound Bed Granulation Amount: Large (67-100%) Exposed Structure Granulation Quality: Red Fascia Exposed: No Necrotic Amount: Small (1-33%) Fat Layer (Subcutaneous Tissue) Exposed: Yes Necrotic Quality: Eschar, Adherent Slough Tendon Exposed: No Muscle Exposed: No Joint Exposed: No Bone Exposed: No [address] ALASDAIR, WALICKI ([id number]) [id number] Page 9 of 17 No Abnormalities Noted: No No Abnormalities Noted: No Callus: No Atrophie Blanche: No Crepitus: No Cyanosis: No Excoriation: No Ecchymosis: No Induration: No Erythema: No Rash: No Hemosiderin Staining: Yes Scarring: No Mottled: No Pallor: No Moisture Rubor: No No Abnormalities Noted: No Dry / Scaly: Yes Maceration: No Treatment Notes Wound #1 (Lower Leg) Wound Laterality: Left, Posterior Cleanser Soap and Water Discharge Instruction: May shower and wash wound with dial antibacterial soap and water prior to dressing change. Vashe 5.8 (oz) Discharge Instruction: Cleanse the wound with Vashe prior to applying a clean dressing using gauze sponges, not tissue or cotton balls. Peri-Wound Care Sween Lotion (Moisturizing lotion) Discharge Instruction: Apply moisturizing lotion as directed Topical Primary Dressing Maxorb Extra Ag+ Alginate Dressing, 4x4.75 (in/in) Discharge Instruction: Apply to  wound bed as instructed Secondary Dressing ABD Pad, 8x10 Discharge Instruction: Apply over primary dressing as directed. Woven Gauze Sponge, Non-Sterile 4x4 in Discharge Instruction: Apply over primary dressing as directed. Secured With Compression Wrap Urgo K2 Lite, (equivalent to a 3 layer) two layer compression system, regular Discharge Instruction: Apply Urgo K2 Lite as directed (alternative to 3 layer compression). Compression Stockings Add-Ons Electronic Signature(s) Signed: 11/13/2022 12:07:40 PM By: Samuella Bruin Entered By: Samuella Bruin on 11/13/2022 07:46:17 -------------------------------------------------------------------------------- Wound Assessment Details Patient Name: Date of Service: [name] CE, RO GER W. 11/13/2022 10:30 A M Medical Record Number: [id number] Patient Account Number: [id number] Date of Birth/Sex: Treating RN: 03-29-1945 (77 y.o. [name] Primary Care Dosha Broshears: [name] Other Clinician: Referring Michalene Debruler: Treating Davey Limas/Extender: [name] in Treatment: 1 Wound Status Wound Number: 2 Primary Etiology: Skin Tear Wound Location: Right, Anterior Lower Leg Wound Status: Open Wounding Event: Trauma Comorbid History: Arrhythmia, Hypertension, Vasculitis, Type II Diabetes Date Acquired: 11/02/2022 Weeks Of Treatment: 1 [name] ([id number]) [phone].pdf Page 10 of 17 Clustered Wound: No Photos Wound Measurements Length: (cm) 1.1 Width: (cm) 1.1 Depth: (cm) 0.1 Area: (cm) 0.95 Volume: (cm) 0.095 % Reduction in Area: -8% % Reduction in Volume: -8% Epithelialization: Small (1-33%) Tunneling: No Undermining: No Wound Description Classification: Partial Thickness Wound Margin: Distinct, outline attached Exudate Amount: Small Exudate Type: Serosanguineous Exudate Color: red, brown Foul Odor After Cleansing: No Slough/Fibrino No Wound  Bed Granulation Amount: Large (67-100%) Exposed Structure Granulation Quality: Red, Pink Fascia Exposed: No Necrotic Amount: None Present (0%) Fat Layer (Subcutaneous Tissue) Exposed: Yes Tendon Exposed: No Muscle Exposed: No Joint Exposed: No Bone Exposed: No Periwound Skin Texture Texture Color No Abnormalities Noted: No No Abnormalities Noted: No Callus: No Atrophie Blanche: No Crepitus: No Cyanosis: No Excoriation: No Ecchymosis: No Induration: No Erythema: No Rash: No Hemosiderin Staining: Yes Scarring: No Mottled: No Pallor: No Moisture Rubor: No No Abnormalities Noted: No Dry / Scaly: No Temperature / Pain Maceration: No Temperature: No Abnormality Treatment Notes Wound #2 (Lower Leg) Wound Laterality: Right, Anterior Cleanser Soap and Water Discharge Instruction: May shower and wash wound with dial antibacterial soap and water prior to dressing change. Vashe 5.8 (oz) Discharge Instruction: Cleanse the wound with Vashe prior to applying a clean dressing using gauze sponges, not tissue or cotton balls. Peri-Wound Care Sween Lotion (Moisturizing lotion) Discharge Instruction: Apply moisturizing lotion as directed Topical Primary Dressing Maxorb Extra Ag+ Alginate Dressing, 4x4.75 (in/in) Discharge Instruction: Apply to wound bed as instructed [name], [name] ([id number]) [id number] Page 11 of 17 Secondary Dressing ABD Pad, 8x10 Discharge Instruction: Apply over primary dressing as directed. Woven Gauze Sponge, Non-Sterile 4x4 in Discharge Instruction: Apply over primary dressing as directed. Secured With Compression Wrap Urgo K2 Lite, (equivalent to a 3 layer) two layer compression system, regular Discharge Instruction: Apply Urgo K2 Lite as directed (alternative to 3 layer compression). Compression Stockings Add-Ons Electronic Signature(s) Signed: 11/13/2022 12:07:40 PM By: Gelene Mink By: Samuella Bruin  on 11/13/2022 07:46:38 -------------------------------------------------------------------------------- Wound Assessment Details Patient Name: Date of Service: [name] CE, RO GER W. 11/13/2022 10:30 A M Medical Record Number: [id number] Patient Account Number: [id number] Date of Birth/Sex: Treating RN: 1945/11/06 (77 y.o. [name] Primary Care Analysia Dungee: [name] Other Clinician: Referring Tanai Bouler: Treating Emery Binz/Extender: [name] in Treatment: 1 Wound Status Wound Number: 3 Primary Etiology: Diabetic Wound/Ulcer of the Lower Extremity Wound Location: Right T Great oe Wound Status: Open Wounding Event: Gradually Appeared Comorbid History: Arrhythmia, Hypertension, Vasculitis, Type II Diabetes Date Acquired: 07/15/2022 Weeks Of Treatment: 1 Clustered Wound: No Photos Wound Measurements Length: (cm) 0.3 Width: (cm) 1.1 Depth: (cm) 1.2 Area: (cm) 0.259 Volume: (cm) 0.311 % Reduction in Area: 75.6% % Reduction in Volume: 75.6% Epithelialization: Small (1-33%) Tunneling: No Undermining: No Wound Description Classification: Grade 2 Wound Margin: Distinct, outline attached Exudate Amount: Medium Exudate Type: Serosanguineous Exudate Color: red, brown Foul Odor After Cleansing: No Slough/Fibrino Yes Wound Bed [name] ([id number]) [id number] Page 12 of 17 Granulation Amount: Medium (34-66%) Exposed Structure Granulation Quality: Red Fascia Exposed: No Necrotic Amount: Medium (34-66%) Fat Layer (Subcutaneous Tissue) Exposed: Yes Necrotic Quality: Adherent Slough Tendon Exposed: No Muscle Exposed: No Joint Exposed: No Bone Exposed: Yes Periwound Skin Texture Texture Color No Abnormalities Noted: No No Abnormalities Noted: No Callus: Yes Atrophie Blanche: No Crepitus: No Cyanosis: No Excoriation: No Ecchymosis: No Induration: No Erythema: Yes Rash: No Erythema Location:  Circumferential, Red Streaks Scarring: No Hemosiderin Staining: No Mottled: No Moisture Pallor: No No Abnormalities Noted: Yes Rubor: No Temperature / Pain Temperature: No Abnormality Treatment Notes Wound #3 (Toe Great) Wound Laterality: Right Cleanser Vashe 5.8 (oz) Discharge Instruction: Cleanse the wound with Vashe prior to applying a clean dressing using gauze sponges, not tissue or cotton balls. Wound Cleanser Discharge Instruction: Cleanse the wound with wound cleanser prior to applying a clean dressing using gauze sponges, not tissue or  cotton balls. Peri-Wound Care Topical Gentamicin Discharge Instruction: As directed by physician Mupirocin Ointment Discharge Instruction: Apply Mupirocin (Bactroban) as instructed Primary Dressing Maxorb Extra Ag+ Alginate Dressing, 2x2 (in/in) Discharge Instruction: Apply to wound bed as instructed Secondary Dressing Optifoam Non-Adhesive Dressing, 4x4 in Discharge Instruction: Apply over primary dressing foam donut Woven Gauze Sponges 2x2 in Discharge Instruction: Apply over primary dressing as directed. Secured With Conforming Stretch Gauze Bandage, Sterile 2x75 (in/in) Discharge Instruction: Secure with stretch gauze as directed. 7M Medipore H Soft Cloth Surgical T ape, 4 x 10 (in/yd) Discharge Instruction: Secure with tape as directed. Compression Wrap Compression Stockings Add-Ons Electronic Signature(s) Signed: 11/13/2022 12:07:40 PM By: Samuella Bruin Entered By: Samuella Bruin on 11/13/2022 07:55:44 [name] ([id number]) [id number] Page 13 of 17 -------------------------------------------------------------------------------- Wound Assessment Details Patient Name: Date of Service: [name] CE, RO GER W. 11/13/2022 10:30 A M Medical Record Number: [id number] Patient Account Number: [id number] Date of Birth/Sex: Treating RN: February 21, 1946 (77 y.o. [name] Primary Care  Maegen Wigle: [name] Other Clinician: Referring Keyondra Lagrand: Treating Sesar Madewell/Extender: [name] in Treatment: 1 Wound Status Wound Number: 4 Primary Etiology: Diabetic Wound/Ulcer of the Lower Extremity Wound Location: Left, Posterior Calcaneus Wound Status: Open Wounding Event: Gradually Appeared Comorbid History: Arrhythmia, Hypertension, Vasculitis, Type II Diabetes Date Acquired: 08/18/2022 Weeks Of Treatment: 1 Clustered Wound: No Photos Wound Measurements Length: (cm) 0. Width: (cm) 0. Depth: (cm) 0. Area: (cm) 0 Volume: (cm) 0 6 % Reduction in Area: 67.7% 7 % Reduction in Volume: 67.6% 1 Epithelialization: Medium (34-66%) .33 Tunneling: No .033 Undermining: No Wound Description Classification: Grade 1 Wound Margin: Distinct, outline attached Exudate Amount: Medium Exudate Type: Serosanguineous Exudate Color: red, brown Foul Odor After Cleansing: No Slough/Fibrino Yes Wound Bed Granulation Amount: Large (67-100%) Exposed Structure Granulation Quality: Red Fascia Exposed: No Necrotic Amount: Small (1-33%) Fat Layer (Subcutaneous Tissue) Exposed: Yes Necrotic Quality: Adherent Slough Tendon Exposed: No Muscle Exposed: No Joint Exposed: No Bone Exposed: No Periwound Skin Texture Texture Color No Abnormalities Noted: No No Abnormalities Noted: No Callus: No Atrophie Blanche: No Crepitus: No Cyanosis: No Excoriation: No Ecchymosis: No Induration: No Erythema: No Rash: No Hemosiderin Staining: No Scarring: No Mottled: No Pallor: No Moisture Rubor: No No Abnormalities Noted: No Dry / Scaly: No Maceration: No [name], [name] ([id number]) [id number] Page 14 of 17 Treatment Notes Wound #4 (Calcaneus) Wound Laterality: Left, Posterior Cleanser Soap and Water Discharge Instruction: May shower and wash wound with dial antibacterial soap and water prior to dressing change. Vashe 5.8  (oz) Discharge Instruction: Cleanse the wound with Vashe prior to applying a clean dressing using gauze sponges, not tissue or cotton balls. Peri-Wound Care Sween Lotion (Moisturizing lotion) Discharge Instruction: Apply moisturizing lotion as directed Topical Primary Dressing Maxorb Extra Ag+ Alginate Dressing, 4x4.75 (in/in) Discharge Instruction: Apply to wound bed as instructed Secondary Dressing ALLEVYN Heel 4 1/2in x 5 1/2in / 10.5cm x 13.5cm Discharge Instruction: Apply over primary dressing as directed. Woven Gauze Sponge, Non-Sterile 4x4 in Discharge Instruction: Apply over primary dressing as directed. Secured With Compression Wrap Urgo K2 Lite, (equivalent to a 3 layer) two layer compression system, regular Discharge Instruction: Apply Urgo K2 Lite as directed (alternative to 3 layer compression). Compression Stockings Add-Ons Electronic Signature(s) Signed: 11/13/2022 12:07:40 PM By: Samuella Bruin Entered By: Samuella Bruin on 11/13/2022 07:47:33 -------------------------------------------------------------------------------- Wound Assessment Details Patient Name: Date of Service: [name] CE, RO GER W. 11/13/2022 10:30 A M Medical Record Number: [id number]  Patient Account Number: [id number] Date of Birth/Sex: Treating RN: [birth year] (77 y.o. [name] Primary Care Daivik Overley: [name] Other Clinician: Referring Myshawn Chiriboga: Treating Laveta Gilkey/Extender: [name] in Treatment: 1 Wound Status Wound Number: 5 Primary Etiology: Diabetic Wound/Ulcer of the Lower Extremity Wound Location: Left T Fourth oe Wound Status: Open Wounding Event: Pressure Injury Comorbid History: Arrhythmia, Hypertension, Vasculitis, Type II Diabetes Date Acquired: 11/06/2022 Weeks Of Treatment: 1 Clustered Wound: No Photos [name], [name] ([id number]) [phone].pdf Page 15 of 17 Wound Measurements Length:  (cm) 0.4 Width: (cm) 0.4 Depth: (cm) 0.1 Area: (cm) 0.126 Volume: (cm) 0.013 % Reduction in Area: 35.7% % Reduction in Volume: 35% Epithelialization: Small (1-33%) Tunneling: No Undermining: No Wound Description Classification: Grade 1 Wound Margin: Distinct, outline attached Exudate Amount: None Present Foul Odor After Cleansing: No Slough/Fibrino No Wound Bed Granulation Amount: Small (1-33%) Exposed Structure Granulation Quality: Red Fascia Exposed: No Necrotic Amount: Large (67-100%) Fat Layer (Subcutaneous Tissue) Exposed: Yes Necrotic Quality: Eschar Tendon Exposed: No Muscle Exposed: No Joint Exposed: No Bone Exposed: No Periwound Skin Texture Texture Color No Abnormalities Noted: No No Abnormalities Noted: No Callus: Yes Atrophie Blanche: No Crepitus: No Cyanosis: No Excoriation: No Ecchymosis: No Induration: No Erythema: No Rash: No Hemosiderin Staining: No Scarring: No Mottled: No Pallor: No Moisture Rubor: No No Abnormalities Noted: No Dry / Scaly: No Maceration: No Treatment Notes Wound #5 (Toe Fourth) Wound Laterality: Left Cleanser Vashe 5.8 (oz) Discharge Instruction: Cleanse the wound with Vashe prior to applying a clean dressing using gauze sponges, not tissue or cotton balls. Wound Cleanser Discharge Instruction: Cleanse the wound with wound cleanser prior to applying a clean dressing using gauze sponges, not tissue or cotton balls. Peri-Wound Care Topical Gentamicin Discharge Instruction: As directed by physician Mupirocin Ointment Discharge Instruction: Apply Mupirocin (Bactroban) as instructed Primary Dressing Secondary Dressing Woven Gauze Sponges 2x2 in Discharge Instruction: Apply over primary dressing as directed. Secured With [name], [name] ([id number]) [id number] Page 16 of 17 Conforming Stretch Gauze Bandage, Sterile 2x75 (in/in) Discharge Instruction: Secure with stretch gauze as  directed. 25M Medipore H Soft Cloth Surgical T ape, 4 x 10 (in/yd) Discharge Instruction: Secure with tape as directed. Compression Wrap Compression Stockings Add-Ons Electronic Signature(s) Signed: 11/13/2022 12:07:40 PM By: Samuella Bruin Entered By: Samuella Bruin on 11/13/2022 07:47:52 -------------------------------------------------------------------------------- Wound Assessment Details Patient Name: Date of Service: [name] CE, RO GER W. 11/13/2022 10:30 A M Medical Record Number: [id number] Patient Account Number: [id number] Date of Birth/Sex: Treating RN: [birth year] (77 y.o. [name] Primary Care Weltha Cathy: [name] Other Clinician: Referring Kyree Fedorko: Treating Levada Bowersox/Extender: [name] in Treatment: 1 Wound Status Wound Number: 6 Primary Etiology: Diabetic Wound/Ulcer of the Lower Extremity Wound Location: Left Metatarsal head first Wound Status: Open Wounding Event: Gradually Appeared Comorbid History: Arrhythmia, Hypertension, Vasculitis, Type II Diabetes Date Acquired: 11/13/2022 Weeks Of Treatment: 0 Clustered Wound: No Wound Measurements Length: (cm) 0.5 Width: (cm) 0.2 Depth: (cm) 0.1 Area: (cm) 0.079 Volume: (cm) 0.008 % Reduction in Area: % Reduction in Volume: Epithelialization: None Tunneling: No Undermining: No Wound Description Classification: Grade 1 Wound Margin: Distinct, outline attached Exudate Amount: Medium Exudate Type: Serosanguineous Exudate Color: red, brown Foul Odor After Cleansing: No Slough/Fibrino No Wound Bed Granulation Amount: Small (1-33%) Exposed Structure Granulation Quality: Red Fascia Exposed: No Necrotic Amount: Large (67-100%) Fat Layer (Subcutaneous Tissue) Exposed: Yes Necrotic Quality: Eschar Tendon Exposed: No Muscle Exposed: No Joint Exposed: No Bone Exposed:  No Periwound Skin Texture Texture Color No Abnormalities Noted: No No Abnormalities  Noted: Yes Callus: Yes Temperature / Pain Temperature: No Abnormality Moisture No Abnormalities Noted: Yes Treatment Notes Wound #6 (Metatarsal head first) Wound Laterality: Left [name], [name] ([id number]) [phone].pdf Page 17 of 17 Cleanser Soap and Water Discharge Instruction: May shower and wash wound with dial antibacterial soap and water prior to dressing change. Vashe 5.8 (oz) Discharge Instruction: Cleanse the wound with Vashe prior to applying a clean dressing using gauze sponges, not tissue or cotton balls. Peri-Wound Care Sween Lotion (Moisturizing lotion) Discharge Instruction: Apply moisturizing lotion as directed Topical Primary Dressing Maxorb Extra Ag+ Alginate Dressing, 4x4.75 (in/in) Discharge Instruction: Apply to wound bed as instructed Secondary Dressing ABD Pad, 8x10 Discharge Instruction: Apply over primary dressing as directed. Woven Gauze Sponge, Non-Sterile 4x4 in Discharge Instruction: Apply over primary dressing as directed. Secured With Compression Wrap Urgo K2 Lite, (equivalent to a 3 layer) two layer compression system, regular Discharge Instruction: Apply Urgo K2 Lite as directed (alternative to 3 layer compression). Compression Stockings Add-Ons Electronic Signature(s) Signed: 11/13/2022 12:07:40 PM By: Samuella Bruin Entered By: Samuella Bruin on 11/13/2022 07:59:34 -------------------------------------------------------------------------------- Vitals Details Patient Name: Date of Service: [name] CE, RO GER W. 11/13/2022 10:30 A M Medical Record Number: [id number] Patient Account Number: [id number] Date of Birth/Sex: Treating RN: 18-Aug-1945 (77 y.o. [name] Primary Care Ledora Delker: [name] Other Clinician: Referring Gregory Barrick: Treating Colbert Curenton/Extender: [name] in Treatment: 1 Vital Signs Time Taken: 10:30 Temperature (F): 97.8 Height (in):  73 Pulse (bpm): 74 Weight (lbs): 192 Respiratory Rate (breaths/min): 18 Body Mass Index (BMI): 25.3 Blood Pressure (mmHg): 138/63 Reference Range: 80 - 120 mg / dl Electronic Signature(s) Signed: 11/13/2022 12:07:40 PM By: Samuella Bruin Entered By: Samuella Bruin on 11/13/2022 07:30:40

## 2022-11-13 NOTE — Progress Notes (Signed)
WALDON, LODES ([id number]) [id number] Page 1 of 16 Visit Report for 11/13/2022 Chief Complaint Document Details Patient Name: Date of Service: [name], [name] 11/13/2022 10:30 A M Medical Record Number: [id number] Patient Account Number: [id number] Date of Birth/Sex: Treating RN: [name][birth year] (77 y.o. M) Primary Care Provider: Sanjuan Dame Other Clinician: Referring Provider: Treating Provider/Extender: Floyce Stakes in Treatment: 1 Information Obtained from: Patient Chief Complaint Patients presents for treatment of multiple open diabetic ulcers on the feet and lower legs Electronic Signature(s) Signed: 11/13/2022 11:Norris:52 AM By: Duanne Guess MD FACS Entered By: Duanne Guess on 11/13/2022 08:Norris:51 -------------------------------------------------------------------------------- Debridement Details Patient Name: Date of Service: [name], [name] 11/13/2022 10:30 A M Medical Record Number: [id number] Patient Account Number: [id number] Date of Birth/Sex: Treating RN: [name], [name] (77 y.o. [name] Primary Care Provider: Sanjuan Dame Other Clinician: Referring Provider: Treating Provider/Extender: Floyce Stakes in Treatment: 1 Debridement Performed for Assessment: Wound #2 Right,Anterior Lower Leg Performed By: Physician Duanne Guess, MD Debridement Type: Debridement Level of Consciousness (Pre-procedure): Awake and Alert Pre-procedure Verification/Time Out Yes - 10:54 Taken: Start Time: 10:54 Pain Control: Lidocaine 4% T opical Solution Percent of Wound Bed Debrided: 100% T Area Debrided (cm): otal 0.95 Tissue and other material debrided: Non-Viable, Slough, Slough Level: Non-Viable Tissue Debridement Description: Selective/Open Wound Instrument: Curette Bleeding: Minimum Hemostasis Achieved: Pressure Response to Treatment: Procedure was tolerated  well Level of Consciousness (Post- Awake and Alert procedure): Post Debridement Measurements of Total Wound Length: (cm) 1.1 Width: (cm) 1.1 Depth: (cm) 0.1 Volume: (cm) 0.095 Character of Wound/Ulcer Post Debridement: Improved Post Procedure Diagnosis Same as Pre-procedure [name] ([id number]) [id number] Page 2 of 16 Notes scribed for Dr. Lady Gary by Samuella Bruin, RN Electronic Signature(s) Signed: 11/13/2022 11:20:44 AM By: Duanne Guess MD FACS Signed: 11/13/2022 12:07:40 PM By: Gelene Mink By: Samuella Bruin on 11/13/2022 07:54:52 -------------------------------------------------------------------------------- Debridement Details Patient Name: Date of Service: [name], [name] 11/13/2022 10:30 A M Medical Record Number: [id number] Patient Account Number: [id number] Date of Birth/Sex: Treating RN: [name][birth year] (77 y.o. [name] Primary Care Provider: Sanjuan Dame Other Clinician: Referring Provider: Treating Provider/Extender: Floyce Stakes in Treatment: 1 Debridement Performed for Assessment: Wound #3 Right T Great oe Performed By: Physician Duanne Guess, MD Debridement Type: Debridement Severity of Tissue Pre Debridement: Bone involvement without necrosis Level of Consciousness (Pre-procedure): Awake and Alert Pre-procedure Verification/Time Out Yes - 10:54 Taken: Start Time: 10:54 Pain Control: Lidocaine 4% T opical Solution Percent of Wound Bed Debrided: 100% T Area Debrided (cm): otal 0.26 Tissue and other material debrided: Non-Viable, Slough, Subcutaneous, Slough Level: Skin/Subcutaneous Tissue Debridement Description: Excisional Instrument: Curette Bleeding: Minimum Hemostasis Achieved: Pressure Response to Treatment: Procedure was tolerated well Level of Consciousness (Post- Awake and Alert procedure): Post Debridement Measurements of Total  Wound Length: (cm) 0.3 Width: (cm) 1.1 Depth: (cm) 1.2 Volume: (cm) 0.311 Character of Wound/Ulcer Post Debridement: Improved Severity of Tissue Post Debridement: Bone involvement without necrosis Post Procedure Diagnosis Same as Pre-procedure Notes scribed for Dr. Lady Gary by Samuella Bruin, RN Electronic Signature(s) Signed: 11/13/2022 11:20:44 AM By: Duanne Guess MD FACS Signed: 11/13/2022 12:07:40 PM By: Samuella Bruin Entered By: Samuella Bruin on 11/13/2022 07:56:01 Debridement Details -------------------------------------------------------------------------------- [name] ([id number]) [id number] Page 3 of 16 Patient Name: Date of Service: [name], [name] 11/13/2022 10:30 A M Medical Record Number: [id number]  Patient Account Number: [id number] Date of Birth/Sex: Treating RN: [name], [name] (77 y.o. [name] Primary Care Provider: Sanjuan Dame Other Clinician: Referring Provider: Treating Provider/Extender: Floyce Stakes in Treatment: 1 Debridement Performed for Assessment: Wound #5 Left T Fourth oe Performed By: Physician Duanne Guess, MD Debridement Type: Debridement Severity of Tissue Pre Debridement: Fat layer exposed Level of Consciousness (Pre-procedure): Awake and Alert Pre-procedure Verification/Time Out Yes - 10:54 Taken: Start Time: 10:54 Pain Control: Lidocaine 4% Topical Solution Percent of Wound Bed Debrided: 100% T Area Debrided (cm): otal 0.13 Tissue and other material debrided: Non-Viable, Callus, Slough, Skin: Epidermis, Slough Level: Skin/Epidermis Debridement Description: Selective/Open Wound Instrument: Curette Bleeding: Minimum Hemostasis Achieved: Pressure Response to Treatment: Procedure was tolerated well Level of Consciousness (Post- Awake and Alert procedure): Post Debridement Measurements of Total Wound Length: (cm) 0.4 Width: (cm)  0.4 Depth: (cm) 0.1 Volume: (cm) 0.013 Character of Wound/Ulcer Post Debridement: Improved Severity of Tissue Post Debridement: Fat layer exposed Post Procedure Diagnosis Same as Pre-procedure Notes scribed for Dr. Lady Gary by Samuella Bruin, RN Electronic Signature(s) Signed: 11/13/2022 11:20:44 AM By: Duanne Guess MD FACS Signed: 11/13/2022 12:07:40 PM By: Samuella Bruin Entered By: Samuella Bruin on 11/13/2022 07:57:48 -------------------------------------------------------------------------------- Debridement Details Patient Name: Date of Service: [name], [name] 11/13/2022 10:30 A M Medical Record Number: [id number] Patient Account Number: [id number] Date of Birth/Sex: Treating RN: [name][birth year] (77 y.o. [name] Primary Care Provider: Sanjuan Dame Other Clinician: Referring Provider: Treating Provider/Extender: Floyce Stakes in Treatment: 1 Debridement Performed for Assessment: Wound #6 Left Metatarsal head first Performed By: Physician Duanne Guess, MD Debridement Type: Debridement Severity of Tissue Pre Debridement: Fat layer exposed Level of Consciousness (Pre-procedure): Awake and Alert Pre-procedure Verification/Time Out Yes - 10:54 Taken: Start Time: 10:54 Pain Control: Lidocaine 4% Topical Solution Percent of Wound Bed Debrided: 100% T Area Debrided (cm): otal 0.08 Tissue and other material debrided: Non-Viable, Callus Level: Non-Viable Tissue Debridement Description: Selective/Open Wound [name] ([id number]) [id number] Page 4 of 16 Instrument: Curette Bleeding: Minimum Hemostasis Achieved: Pressure Response to Treatment: Procedure was tolerated well Level of Consciousness (Post- Awake and Alert procedure): Post Debridement Measurements of Total Wound Length: (cm) 0.5 Width: (cm) 0.2 Depth: (cm) 0.1 Volume: (cm) 0.008 Character of Wound/Ulcer Post  Debridement: Improved Severity of Tissue Post Debridement: Fat layer exposed Post Procedure Diagnosis Same as Pre-procedure Notes scribed for Dr. Lady Gary by Samuella Bruin, RN Electronic Signature(s) Signed: 11/13/2022 11:20:44 AM By: Duanne Guess MD FACS Signed: 11/13/2022 12:07:40 PM By: Samuella Bruin Entered By: Samuella Bruin on 11/13/2022 08:00:24 -------------------------------------------------------------------------------- Debridement Details Patient Name: Date of Service: [name], [name] 11/13/2022 10:30 A M Medical Record Number: [id number] Patient Account Number: [id number] Date of Birth/Sex: Treating RN: [birth year], [name] (77 y.o. [name] Primary Care Provider: Sanjuan Dame Other Clinician: Referring Provider: Treating Provider/Extender: Floyce Stakes in Treatment: 1 Debridement Performed for Assessment: Wound #4 Left,Posterior Calcaneus Performed By: Physician Duanne Guess, MD Debridement Type: Debridement Severity of Tissue Pre Debridement: Fat layer exposed Level of Consciousness (Pre-procedure): Awake and Alert Pre-procedure Verification/Time Out Yes - 10:54 Taken: Start Time: 10:54 Pain Control: Lidocaine 4% T opical Solution Percent of Wound Bed Debrided: 100% T Area Debrided (cm): otal 0.33 Tissue and other material debrided: Non-Viable, Slough, Slough Level: Non-Viable Tissue Debridement Description: Selective/Open Wound Instrument: Curette Bleeding: Minimum Hemostasis Achieved: Pressure Response to Treatment: Procedure was tolerated well Level of  Consciousness (Post- Awake and Alert procedure): Post Debridement Measurements of Total Wound Length: (cm) 0.6 Width: (cm) 0.7 Depth: (cm) 0.1 Volume: (cm) 0.033 Character of Wound/Ulcer Post Debridement: Improved Severity of Tissue Post Debridement: Fat layer exposed Post Procedure Diagnosis Same as Pre-procedure [name]  ([id number]) [id number] Page 5 of 16 Notes scribed for Dr. Lady Gary by Samuella Bruin, RN Electronic Signature(s) Signed: 11/13/2022 11:20:44 AM By: Duanne Guess MD FACS Signed: 11/13/2022 12:07:40 PM By: Gelene Mink By: Samuella Bruin on 11/13/2022 08:00:52 -------------------------------------------------------------------------------- Debridement Details Patient Name: Date of Service: [name], [name] 11/13/2022 10:30 A M Medical Record Number: [id number] Patient Account Number: [id number] Date of Birth/Sex: Treating RN: [name] (77 y.o. [name] Primary Care Provider: Sanjuan Dame Other Clinician: Referring Provider: Treating Provider/Extender: Floyce Stakes in Treatment: 1 Debridement Performed for Assessment: Wound #1 Left,Posterior Lower Leg Performed By: Physician Duanne Guess, MD Debridement Type: Debridement Severity of Tissue Pre Debridement: Fat layer exposed Level of Consciousness (Pre-procedure): Awake and Alert Pre-procedure Verification/Time Out Yes - 10:54 Taken: Start Time: 10:54 Pain Control: Lidocaine 4% Topical Solution Percent of Wound Bed Debrided: 100% T Area Debrided (cm): otal 0.43 Tissue and other material debrided: Non-Viable, Eschar Level: Non-Viable Tissue Debridement Description: Selective/Open Wound Instrument: Curette Bleeding: Minimum Hemostasis Achieved: Pressure Response to Treatment: Procedure was tolerated well Level of Consciousness (Post- Awake and Alert procedure): Post Debridement Measurements of Total Wound Length: (cm) 1.1 Width: (cm) 0.5 Depth: (cm) 0.1 Volume: (cm) 0.043 Character of Wound/Ulcer Post Debridement: Improved Severity of Tissue Post Debridement: Fat layer exposed Post Procedure Diagnosis Same as Pre-procedure Notes scribed for Dr. Lady Gary by Samuella Bruin, RN Electronic Signature(s) Signed:  11/13/2022 11:20:44 AM By: Duanne Guess MD FACS Signed: 11/13/2022 12:07:40 PM By: Samuella Bruin Entered By: Samuella Bruin on 11/13/2022 08:01:24 -------------------------------------------------------------------------------- HPI Details Patient Name: Date of Service: [name], [name] 11/13/2022 10:30 A [name] ([id number]) [id number] Page 6 of 16 Medical Record Number: [id number] Patient Account Number: [id number] Date of Birth/Sex: Treating RN: Jul 10, [name] (77 y.o. 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 It appears that he has been receiving some treatment through the [facility] system, based on review of the electronic medical record. Unfortunately, the patient is an extremely poor historian and his recollection of timelines and interventions is somewhat scattered. He has an ulcer on his left heel, left posterior calf, left fourth toe, right great toe, right anterior tibia. Based upon what he had on his wounds when he came to clinic, he has been applying Xeroform to everything. 11/13/2022: All of the existing wounds are smaller with the exception of the great toe ulcer on the right. He has a new wound on his left first metatarsal head where it appears the tissue underneath the callus has broken down. It is fairly superficial and clean. He has had his foot x-ray done, but it has not yet been read. I am concerned about the distal tuft of the right great toe. Electronic Signature(s) Signed: 11/13/2022 11:06:12 AM By: Duanne Guess MD FACS Entered By: Duanne Guess on 11/13/2022 08:06:12 -------------------------------------------------------------------------------- Chemical Cauterization Details Patient Name: Date of Service: LO  V ELA Norris, [name] 11/13/2022 10:30 A M Medical Record Number: [id number] Patient Account Number: [id number] Date of Birth/Sex: Treating RN: [birth year] (77 y.o. [name] Primary Care Provider: Sanjuan Dame Other Clinician: Referring Provider: Treating Provider/Extender: Floyce Stakes in Treatment: 1 Procedure Performed for: Wound #1 Left,Posterior Lower Leg Performed By: Physician Duanne Guess, MD Post Procedure Diagnosis Same as Pre-procedure Notes scribed for Dr. Lady Gary by Samuella Bruin, RN Electronic Signature(s) Signed: 11/13/2022 11:20:44 AM By: Duanne Guess MD FACS Signed: 11/13/2022 12:07:40 PM By: Gelene Mink By: Samuella Bruin on 11/13/2022 08:01:38 -------------------------------------------------------------------------------- Physical Exam Details Patient Name: Date of Service: [name], [name] 11/13/2022 10:30 A M Medical Record Number: [id number] Patient Account Number: [id number] Date of Birth/Sex: Treating RN: [birth year] (77 y.o. M) Primary Care Provider: Sanjuan Dame Other Clinician: Referring Provider: Treating Provider/Extender: Floyce Stakes in Treatment: 1 Constitutional [name] ([id number]) [id number] Page 7 of 16 . . . . no acute distress. Respiratory Normal work of breathing on room air. Notes 11/13/2022: All of the existing wounds are smaller with the exception of the great toe ulcer on the right. He has a new wound on his left first metatarsal head where it appears the tissue underneath the callus has broken down. It is fairly superficial and clean. Electronic Signature(s) Signed: 11/13/2022 11:07:07 AM By: Duanne Guess MD  FACS Entered By: Duanne Guess on 11/13/2022 08:07:06 -------------------------------------------------------------------------------- Physician Orders Details Patient Name: Date of Service: [name], [name] 11/13/2022 10:30 A M Medical Record Number: [id number] Patient Account Number: [id number] Date of Birth/Sex: Treating RN: [birth year] (77 y.o. 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 - Dr.Niah Heinle Return A Anesthetic (In clinic) Topical Lidocaine 4% applied to wound bed Bathing/ Shower/ Hygiene May shower with protection but do not get wound dressing(s) wet. Protect dressing(s) with water repellant cover (for example, large plastic bag) or a cast cover and may then take shower. Edema Control - Lymphedema / SCD / Other Elevate legs to the level of the heart or above for 30 minutes daily and/or when sitting for 3-4 times a day throughout the day. Avoid standing for long periods of time. Off-Loading Other: - shoes that the podiatrist gave you Wound Treatment Wound #1 - Lower Leg Wound Laterality: Left, Posterior Cleanser: Soap and Water 1 x Per Week/30  Days Discharge Instructions: May shower and wash wound with dial antibacterial soap and water prior to dressing change. Cleanser: Vashe 5.8 (oz) 1 x Per Week/30 Days Discharge Instructions: Cleanse the wound with Vashe prior to applying a clean dressing using gauze sponges, not tissue or cotton balls. Peri-Wound Care: Sween Lotion (Moisturizing lotion) 1 x Per Week/30 Days Discharge Instructions: Apply moisturizing  lotion as directed Prim Dressing: Maxorb Extra Ag+ Alginate Dressing, 4x4.75 (in/in) 1 x Per Week/30 Days ary Discharge Instructions: Apply to wound bed as instructed BRAXEN, BOATNER ([id number]) [id number].pdf Page 8 of 16 Secondary Dressing: ABD Pad, 8x10 1 x Per Week/30 Days Discharge Instructions: Apply over primary dressing as directed. Secondary Dressing: Woven Gauze Sponge, Non-Sterile 4x4 in 1 x Per Week/30 Days Discharge Instructions: Apply over primary dressing as directed. Compression Wrap: Urgo K2 Lite, (equivalent to a 3 layer) two layer compression system, regular 1 x Per Week/30 Days Discharge Instructions: Apply Urgo K2 Lite as directed (alternative to 3 layer compression). Wound #2 - Lower Leg Wound Laterality: Right, Anterior Cleanser: Soap and Water 1 x Per Week/30 Days Discharge Instructions: May shower and wash wound with dial antibacterial soap and water prior to dressing change. Cleanser: Vashe 5.8 (oz) 1 x Per Week/30 Days Discharge Instructions: Cleanse the wound with Vashe prior to applying a clean dressing using gauze sponges, not tissue or cotton balls. Peri-Wound Care: Sween Lotion (Moisturizing lotion) 1 x Per Week/30 Days Discharge Instructions: Apply moisturizing lotion as directed Prim Dressing: Maxorb Extra Ag+ Alginate Dressing, 4x4.75 (in/in) 1 x Per Week/30 Days ary Discharge Instructions: Apply to wound bed as instructed Secondary Dressing: ABD Pad, 8x10 1 x Per Week/30 Days Discharge Instructions: Apply over  primary dressing as directed. Secondary Dressing: Woven Gauze Sponge, Non-Sterile 4x4 in 1 x Per Week/30 Days Discharge Instructions: Apply over primary dressing as directed. Compression Wrap: Urgo K2 Lite, (equivalent to a 3 layer) two layer compression system, regular 1 x Per Week/30 Days Discharge Instructions: Apply Urgo K2 Lite as directed (alternative to 3 layer compression). Wound #3 - T Great oe Wound Laterality: Right Cleanser: Vashe 5.8 (oz) 1 x Per Day/30 Days Discharge Instructions: Cleanse the wound with Vashe prior to applying a clean dressing using gauze sponges, not tissue or cotton balls. Cleanser: Wound Cleanser 1 x Per Day/30 Days Discharge Instructions: Cleanse the wound with wound cleanser prior to applying a clean dressing using gauze sponges, not tissue or cotton balls. Topical: Gentamicin 1 x Per Day/30 Days Discharge Instructions: As directed by physician Topical: Mupirocin Ointment 1 x Per Day/30 Days Discharge Instructions: Apply Mupirocin (Bactroban) as instructed Prim Dressing: Maxorb Extra Ag+ Alginate Dressing, 2x2 (in/in) 1 x Per Day/30 Days ary Discharge Instructions: Apply to wound bed as instructed Secondary Dressing: Optifoam Non-Adhesive Dressing, 4x4 in 1 x Per Day/30 Days Discharge Instructions: Apply over primary dressing foam donut Secondary Dressing: Woven Gauze Sponges 2x2 in 1 x Per Day/30 Days Discharge Instructions: Apply over primary dressing as directed. Secured With: Insurance underwriter, Sterile 2x75 (in/in) 1 x Per Day/30 Days Discharge Instructions: Secure with stretch gauze as directed. Secured With: 53M Medipore H Soft Cloth Surgical T ape, 4 x 10 (in/yd) 1 x Per Day/30 Days Discharge Instructions: Secure with tape as directed. Wound #4 - Calcaneus Wound Laterality: Left, Posterior Cleanser: Soap and Water 1 x Per Week/30 Days Discharge Instructions: May shower and wash wound with dial antibacterial soap and water prior to  dressing change. Cleanser: Vashe 5.8 (oz) 1 x Per Week/30 Days Discharge Instructions: Cleanse the wound with Vashe prior to applying a clean dressing using gauze sponges, not tissue or cotton balls. Peri-Wound Care: Sween Lotion (Moisturizing lotion) 1 x Per Week/30 Days Discharge Instructions: Apply moisturizing lotion as directed Prim Dressing: Maxorb Extra Ag+ Alginate Dressing, 4x4.75 (in/in) 1 x Per Week/30 Days ary Discharge Instructions: Apply to wound bed as instructed  Secondary Dressing: ALLEVYN Heel 4 1/2in x 5 1/2in / 10.5cm x 13.5cm 1 x Per Week/30 Days Discharge Instructions: Apply over primary dressing as directed. [name], [name] ([id number]) [id number] Page 9 of 16 Secondary Dressing: Woven Gauze Sponge, Non-Sterile 4x4 in 1 x Per Week/30 Days Discharge Instructions: Apply over primary dressing as directed. Compression Wrap: Urgo K2 Lite, (equivalent to a 3 layer) two layer compression system, regular 1 x Per Week/30 Days Discharge Instructions: Apply Urgo K2 Lite as directed (alternative to 3 layer compression). Wound #5 - T Fourth oe Wound Laterality: Left Cleanser: Vashe 5.8 (oz) 1 x Per Day/30 Days Discharge Instructions: Cleanse the wound with Vashe prior to applying a clean dressing using gauze sponges, not tissue or cotton balls. Cleanser: Wound Cleanser 1 x Per Day/30 Days Discharge Instructions: Cleanse the wound with wound cleanser prior to applying a clean dressing using gauze sponges, not tissue or cotton balls. Topical: Gentamicin 1 x Per Day/30 Days Discharge Instructions: As directed by physician Topical: Mupirocin Ointment 1 x Per Day/30 Days Discharge Instructions: Apply Mupirocin (Bactroban) as instructed Secondary Dressing: Woven Gauze Sponges 2x2 in 1 x Per Day/30 Days Discharge Instructions: Apply over primary dressing as directed. Secured With: Insurance underwriter, Sterile 2x75 (in/in) 1 x Per Day/30  Days Discharge Instructions: Secure with stretch gauze as directed. Secured With: 72M Medipore H Soft Cloth Surgical T ape, 4 x 10 (in/yd) 1 x Per Day/30 Days Discharge Instructions: Secure with tape as directed. Wound #6 - Metatarsal head first Wound Laterality: Left Cleanser: Soap and Water 1 x Per Week/30 Days Discharge Instructions: May shower and wash wound with dial antibacterial soap and water prior to dressing change. Cleanser: Vashe 5.8 (oz) 1 x Per Week/30 Days Discharge Instructions: Cleanse the wound with Vashe prior to applying a clean dressing using gauze sponges, not tissue or cotton balls. Peri-Wound Care: Sween Lotion (Moisturizing lotion) 1 x Per Week/30 Days Discharge Instructions: Apply moisturizing lotion as directed Prim Dressing: Maxorb Extra Ag+ Alginate Dressing, 4x4.75 (in/in) 1 x Per Week/30 Days ary Discharge Instructions: Apply to wound bed as instructed Secondary Dressing: ABD Pad, 8x10 1 x Per Week/30 Days Discharge Instructions: Apply over primary dressing as directed. Secondary Dressing: Woven Gauze Sponge, Non-Sterile 4x4 in 1 x Per Week/30 Days Discharge Instructions: Apply over primary dressing as directed. Compression Wrap: Urgo K2 Lite, (equivalent to a 3 layer) two layer compression system, regular 1 x Per Week/30 Days Discharge Instructions: Apply Urgo K2 Lite as directed (alternative to 3 layer compression). Patient Medications llergies: atorvastatin, lisinopril A Notifications Medication Indication Start End 11/13/2022 lidocaine DOSE topical 4 % cream - cream topical Electronic Signature(s) Signed: 11/13/2022 11:20:44 AM By: Duanne Guess MD FACS Entered By: Duanne Guess on 11/13/2022 08:07:41 -------------------------------------------------------------------------------- Problem List Details Patient Name: Date of Service: [name], [name] 11/13/2022 10:30 A [name] ([id number]) [id number]  Page 10 of 16 Medical Record Number: [id number] Patient Account Number: [id number] Date of Birth/Sex: Treating RN: [birth year] (77 y.o. M) Primary Care Provider: Sanjuan Dame Other Clinician: Referring Provider: Treating Provider/Extender: Floyce Stakes in Treatment: 1 Active Problems ICD-10 Encounter Code Description Active Date MDM Diagnosis L97.512 Non-pressure chronic ulcer of other part of right foot with fat layer exposed 11/06/2022 No Yes L97.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 11/06/2022 No Yes L97.222 Non-pressure chronic ulcer of left calf with fat layer exposed 11/06/2022 No Yes L97.812 Non-pressure chronic  ulcer of other part of right lower leg with fat layer 11/06/2022 No Yes exposed L97.522 Non-pressure chronic ulcer of other part of left foot with fat layer exposed 11/06/2022 No Yes E11.621 Type 2 diabetes mellitus with foot ulcer 11/06/2022 No Yes E11.622 Type 2 diabetes mellitus with other skin ulcer 11/06/2022 No Yes 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) 11/06/2022 No Yes Inactive Problems Resolved Problems Electronic Signature(s) Signed: 11/13/2022 11:Norris:33 AM By: Duanne Guess MD FACS Entered By: Duanne Guess on 11/13/2022 08:Norris:33 -------------------------------------------------------------------------------- Progress Note Details Patient Name: Date of Service: [name], [name] 11/13/2022 10:30 A M Medical Record Number: [id number] Patient Account Number: [id number] Date of Birth/Sex: Treating RN: [name]/Norris/[birth year] (77 y.o. M) Primary Care Provider: Sanjuan Dame Other Clinician: Referring Provider: Treating Provider/Extender: Floyce Stakes in Treatment: 1 Subjective Chief Complaint Information obtained from Patient ANJAY, KOKAL ([id number]) [id number] Page 11 of 16 Patients presents for treatment of  multiple open diabetic ulcers on the feet and lower legs History of Present Illness (HPI) ADMISSION 11/06/2022 ***ABIs LEFT 1.11; RIGHT 1.17*** This is a 77 year old well-controlled diabetic (last hemoglobin A1c 6.2%) who presents for multiple diabetic foot ulcers and leg ulcers. It appears that he has been receiving some treatment through the [facility] system, based on review of the electronic medical record. Unfortunately, the patient is an extremely poor historian and his recollection of timelines and interventions is somewhat scattered. He has an ulcer on his left heel, left posterior calf, left fourth toe, right great toe, right anterior tibia. Based upon what he had on his wounds when he came to clinic, he has been applying Xeroform to everything. 11/13/2022: All of the existing wounds are smaller with the exception of the great toe ulcer on the right. He has a new wound on his left first metatarsal head where it appears the tissue underneath the callus has broken down. It is fairly superficial and clean. He has had his foot x-ray done, but it has not yet been read. I am concerned about the distal tuft of the right great toe. Patient History Information obtained from Patient, Chart. Family History Diabetes - Mother,Siblings, Heart Disease - Father, Hypertension - Mother. Social History Never smoker, Marital Status - Married, Alcohol Use - Never, Drug Use - No History, Caffeine Use - Never. Medical History Cardiovascular Patient has history of Arrhythmia - A. fib, Hypertension, Vasculitis - Leukocytoclastic vasculitis (HCC) Endocrine Patient has history of Type II Diabetes - hgbA1c 6 Hospitalization/Surgery History - cardioversion. Medical A Surgical History Notes nd Respiratory pulmonary HTN Cardiovascular mitral regurgitation Objective Constitutional no acute distress. Vitals Time Taken: 10:30 AM, Height: 73 in, Weight: 192 lbs, BMI: 25.3, Temperature: 97.8 F,  Pulse: 74 bpm, Respiratory Rate: 18 breaths/min, Blood Pressure: 138/63 mmHg. Respiratory Normal work of breathing on room air. General Notes: 11/13/2022: All of the existing wounds are smaller with the exception of the great toe ulcer on the right. He has a new wound on his left first metatarsal head where it appears the tissue underneath the callus has broken down. It is fairly superficial and clean. Integumentary (Hair, Skin) Wound #1 status is Open. Original cause of wound was Gradually Appeared. The date acquired was: 08/15/2022. The wound has been in treatment 1 weeks. The wound is located on the Left,Posterior Lower Leg. The wound measures 1.1cm length x 0.5cm width x 0.1cm depth; 0.432cm^2 area and 0.043cm^3 volume. There is Fat Layer (Subcutaneous Tissue) exposed. There is no tunneling  or undermining noted. There is a medium amount of serosanguineous drainage noted. The wound margin is distinct with the outline attached to the wound base. There is large (67-100%) red granulation within the wound bed. There is a small (1-33%) amount of necrotic tissue within the wound bed including Eschar and Adherent Slough. The periwound skin appearance exhibited: Dry/Scaly, Hemosiderin Staining. The periwound skin appearance did not exhibit: Callus, Crepitus, Excoriation, Induration, Rash, Scarring, Maceration, Atrophie Blanche, Cyanosis, Ecchymosis, Mottled, Pallor, Rubor, Erythema. Wound #2 status is Open. Original cause of wound was Trauma. The date acquired was: 11/02/2022. The wound has been in treatment 1 weeks. The wound is located on the Right,Anterior Lower Leg. The wound measures 1.1cm length x 1.1cm width x 0.1cm depth; 0.95cm^2 area and 0.095cm^3 volume. There is Fat Layer (Subcutaneous Tissue) exposed. There is no tunneling or undermining noted. There is a small amount of serosanguineous drainage noted. The wound margin is distinct with the outline attached to the wound base. There is large  (67-100%) red, pink granulation within the wound bed. There is no necrotic tissue within the wound bed. The periwound skin appearance exhibited: Hemosiderin Staining. The periwound skin appearance did not exhibit: Callus, Crepitus, Excoriation, Induration, Rash, Scarring, Dry/Scaly, Maceration, Atrophie Blanche, Cyanosis, Ecchymosis, Mottled, Pallor, Rubor, Erythema. Periwound temperature was noted as No Abnormality. Wound #3 status is Open. Original cause of wound was Gradually Appeared. The date acquired was: 07/15/2022. The wound has been in treatment 1 weeks. The wound is located on the Right T Great. The wound measures 0.3cm length x 1.1cm width x 1.2cm depth; 0.259cm^2 area and 0.311cm^3 volume. There is oe bone and Fat Layer (Subcutaneous Tissue) exposed. There is no tunneling or undermining noted. There is a medium amount of serosanguineous drainage noted. The wound margin is distinct with the outline attached to the wound base. There is medium (34-66%) red granulation within the wound bed. There is a medium (34-66%) amount of necrotic tissue within the wound bed including Adherent Slough. The periwound skin appearance had no abnormalities noted for moisture. The periwound skin appearance exhibited: Callus, Erythema. The periwound skin appearance did not exhibit: Crepitus, Excoriation, Induration, Rash, Scarring, Atrophie Blanche, Cyanosis, Ecchymosis, Hemosiderin Staining, Mottled, Pallor, Rubor. The surrounding wound skin color is noted with erythema which is circumferential with red streaks. Periwound temperature was noted as No Abnormality. [name], [name] ([id number]) [id number] Page 12 of 16 Wound #4 status is Open. Original cause of wound was Gradually Appeared. The date acquired was: 08/18/2022. The wound has been in treatment 1 weeks. The wound is located on the Left,Posterior Calcaneus. The wound measures 0.6cm length x 0.7cm width x 0.1cm depth; 0.33cm^2  area and 0.033cm^3 volume. There is Fat Layer (Subcutaneous Tissue) exposed. There is no tunneling or undermining noted. There is a medium amount of serosanguineous drainage noted. The wound margin is distinct with the outline attached to the wound base. There is large (67-100%) red granulation within the wound bed. There is a small (1-33%) amount of necrotic tissue within the wound bed including Adherent Slough. The periwound skin appearance did not exhibit: Callus, Crepitus, Excoriation, Induration, Rash, Scarring, Dry/Scaly, Maceration, Atrophie Blanche, Cyanosis, Ecchymosis, Hemosiderin Staining, Mottled, Pallor, Rubor, Erythema. Wound #5 status is Open. Original cause of wound was Pressure Injury. The date acquired was: 11/06/2022. The wound has been in treatment 1 weeks. The wound is located on the Left T Fourth. The wound measures 0.4cm length x 0.4cm width x 0.1cm depth; 0.126cm^2 area and 0.013cm^3 volume. There is Fat  oe Layer (Subcutaneous Tissue) exposed. There is no tunneling or undermining noted. There is a none present amount of drainage noted. The wound margin is distinct with the outline attached to the wound base. There is small (1-33%) red granulation within the wound bed. There is a large (67-100%) amount of necrotic tissue within the wound bed including Eschar. The periwound skin appearance exhibited: Callus. The periwound skin appearance did not exhibit: Crepitus, Excoriation, Induration, Rash, Scarring, Dry/Scaly, Maceration, Atrophie Blanche, Cyanosis, Ecchymosis, Hemosiderin Staining, Mottled, Pallor, Rubor, Erythema. Wound #6 status is Open. Original cause of wound was Gradually Appeared. The date acquired was: 11/13/2022. The wound is located on the Left Metatarsal head first. The wound measures 0.5cm length x 0.2cm width x 0.1cm depth; 0.079cm^2 area and 0.008cm^3 volume. There is Fat Layer (Subcutaneous Tissue) exposed. There is no tunneling or undermining noted. There is a  medium amount of serosanguineous drainage noted. The wound margin is distinct with the outline attached to the wound base. There is small (1-33%) red granulation within the wound bed. There is a large (67-100%) amount of necrotic tissue within the wound bed including Eschar. The periwound skin appearance had no abnormalities noted for moisture. The periwound skin appearance had no abnormalities noted for color. The periwound skin appearance exhibited: Callus. Periwound temperature was noted as No Abnormality. 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 There was a Selective/Open Wound Non-Viable Tissue Debridement with a total area of 0.43 sq cm performed by Duanne Guess, MD. With the following instrument(s): Curette to remove Non-Viable tissue/material. Material removed includes Eschar after achieving pain control using Lidocaine 4% Topical Solution. No specimens were taken. A time out was conducted at 10:54, prior to the start of the procedure. A Minimum amount of bleeding was controlled with Pressure. The procedure was tolerated well. Post Debridement Measurements: 1.1cm length x 0.5cm width x 0.1cm depth; 0.043cm^3 volume. Character of Wound/Ulcer Post Debridement is improved. Severity of Tissue Post Debridement is: Fat  layer exposed. Post procedure Diagnosis Wound #1: Same as Pre-Procedure General Notes: scribed for Dr. Lady Gary by Samuella Bruin, RN. Pre-procedure diagnosis of Wound #1 is a Venous Leg Ulcer located on the Left,Posterior Lower Leg . There was a Double Layer Compression Therapy Procedure by Samuella Bruin, RN. Post procedure Diagnosis Wound #1: Same as Pre-Procedure Pre-procedure diagnosis of Wound #1 is a Venous Leg Ulcer located on the Left,Posterior Lower Leg . An Chemical Cauterization procedure was performed by Duanne Guess, MD. Post procedure Diagnosis Wound #1: Same as Pre-Procedure Notes: scribed for Dr. Lady Gary by Samuella Bruin, RN Wound #2 Pre-procedure diagnosis of Wound #2 is a Skin T located on the Right,Anterior Lower Leg . There was a Selective/Open Wound Non-Viable Tissue ear Debridement with a total area of 0.95 sq cm performed by Duanne Guess, MD. With the following instrument(s): Curette to remove Non-Viable tissue/material. Material removed includes [facility] after achieving pain  control using Lidocaine 4% Topical Solution. No specimens were taken. A time out was conducted at 10:54, prior to the start of the procedure. A Minimum amount of bleeding was controlled with Pressure. The procedure was tolerated well. Post Debridement Measurements: 1.1cm length x 1.1cm width x 0.1cm depth; 0.095cm^3 volume. Character of Wound/Ulcer Post Debridement is improved. Post procedure Diagnosis Wound #2: Same as Pre-Procedure General Notes: scribed for Dr. Lady Gary by Samuella Bruin, RN. Pre-procedure diagnosis of Wound #2 is a Skin T located on the Right,Anterior Lower Leg . There was a Double Layer Compression Therapy Procedure by ear Samuella Bruin, RN. Post procedure Diagnosis Wound #2: Same as Pre-Procedure Wound #3 Pre-procedure diagnosis of Wound #3 is a Diabetic Wound/Ulcer of the Lower Extremity located on the Right T Great .Severity of Tissue Pre Debridement  is: oe Bone involvement without necrosis. There was a Excisional Skin/Subcutaneous Tissue Debridement with a total area of 0.26 sq cm performed by Duanne Guess, MD. With the following instrument(s): Curette to remove Non-Viable tissue/material. Material removed includes Subcutaneous Tissue and Slough and after achieving pain control using Lidocaine 4% Topical Solution. No specimens were taken. A time out was conducted at 10:54, prior to the start of the procedure. A Minimum amount of bleeding was controlled with Pressure. The procedure was tolerated well. Post Debridement Measurements: 0.3cm length x 1.1cm width x 1.2cm depth; 0.311cm^3 volume. Character of Wound/Ulcer Post Debridement is improved. Severity of Tissue Post Debridement is: Bone involvement without necrosis. Post procedure Diagnosis Wound #3: Same as Pre-Procedure [name], [name] ([id number]) [id number].pdf Page 13 of 16 General Notes: scribed for Dr. Lady Gary by Samuella Bruin, RN. Wound #4 Pre-procedure diagnosis of Wound #4 is a Diabetic Wound/Ulcer of the Lower Extremity located on the Left,Posterior Calcaneus .Severity of Tissue Pre Debridement is: Fat layer exposed. There was a Selective/Open Wound Non-Viable Tissue Debridement with a total area of 0.33 sq cm performed by Duanne Guess, MD. With the following instrument(s): Curette to remove Non-Viable tissue/material. Material removed includes [facility] after achieving pain control using Lidocaine 4% T opical Solution. No specimens were taken. A time out was conducted at 10:54, prior to the start of the procedure. A Minimum amount of bleeding was controlled with Pressure. The procedure was tolerated well. Post Debridement Measurements: 0.6cm length x 0.7cm width x 0.1cm depth; 0.033cm^3 volume. Character of Wound/Ulcer Post Debridement is improved. Severity of Tissue Post Debridement is: Fat layer exposed. Post procedure Diagnosis Wound #4: Same  as Pre-Procedure General Notes: scribed for Dr. Lady Gary by Samuella Bruin, RN. Wound #5 Pre-procedure diagnosis of Wound #5 is a Diabetic Wound/Ulcer of the Lower Extremity located on the Left T Fourth .Severity of Tissue Pre Debridement is: oe Fat layer exposed. There was a Selective/Open Wound Skin/Epidermis Debridement with a total area of 0.13 sq cm performed by Duanne Guess, MD. With the following instrument(s): Curette to remove Non-Viable tissue/material. Material removed includes Callus, Slough, and Skin: Epidermis after achieving pain control using Lidocaine 4% T opical Solution. No specimens were taken. A time out was conducted at 10:54, prior to the start of the procedure. A Minimum amount of bleeding was controlled with Pressure. The procedure was tolerated well. Post Debridement Measurements: 0.4cm length x 0.4cm width x 0.1cm depth; 0.013cm^3 volume. Character of Wound/Ulcer Post Debridement is improved. Severity of Tissue Post Debridement is: Fat layer exposed. Post procedure Diagnosis Wound #5: Same as Pre-Procedure General Notes: scribed for Dr. Lady Gary by Samuella Bruin, RN. Wound #6 Pre-procedure diagnosis of Wound #6 is a  Diabetic Wound/Ulcer of the Lower Extremity located on the Left Metatarsal head first .Severity of Tissue Pre Debridement is: Fat layer exposed. There was a Selective/Open Wound Non-Viable Tissue Debridement with a total area of 0.08 sq cm performed by Duanne Guess, MD. With the following instrument(s): Curette to remove Non-Viable tissue/material. Material removed includes Callus after achieving pain control using Lidocaine 4% T opical Solution. No specimens were taken. A time out was conducted at 10:54, prior to the start of the procedure. A Minimum amount of bleeding was controlled with Pressure. The procedure was tolerated well. Post Debridement Measurements: 0.5cm length x 0.2cm width x 0.1cm depth; 0.008cm^3 volume. Character of Wound/Ulcer  Post Debridement is improved. Severity of Tissue Post Debridement is: Fat layer exposed. Post procedure Diagnosis Wound #6: Same as Pre-Procedure General Notes: scribed for Dr. Lady Gary by Samuella Bruin, RN. Plan Follow-up Appointments: Return Appointment in 1 week. - Dr.Mystery Schrupp Anesthetic: (In clinic) Topical Lidocaine 4% applied to wound bed Bathing/ Shower/ Hygiene: May shower with protection but do not get wound dressing(s) wet. Protect dressing(s) with water repellant cover (for example, large plastic bag) or a cast cover and may then take shower. Edema Control - Lymphedema / SCD / Other: Elevate legs to the level of the heart or above for 30 minutes daily and/or when sitting for 3-4 times a day throughout the day. Avoid standing for long periods of time. Off-Loading: Other: - shoes that the podiatrist gave you The following medication(s) was prescribed: lidocaine topical 4 % cream cream topical was prescribed at facility WOUND #1: - Lower Leg Wound Laterality: Left, Posterior Cleanser: Soap and Water 1 x Per Week/30 Days Discharge Instructions: May shower and wash wound with dial antibacterial soap and water prior to dressing change. Cleanser: Vashe 5.8 (oz) 1 x Per Week/30 Days Discharge Instructions: Cleanse the wound with Vashe prior to applying a clean dressing using gauze sponges, not tissue or cotton balls. Peri-Wound Care: Sween Lotion (Moisturizing lotion) 1 x Per Week/30 Days Discharge Instructions: Apply moisturizing lotion as directed Prim Dressing: Maxorb Extra Ag+ Alginate Dressing, 4x4.75 (in/in) 1 x Per Week/30 Days ary Discharge Instructions: Apply to wound bed as instructed Secondary Dressing: ABD Pad, 8x10 1 x Per Week/30 Days Discharge Instructions: Apply over primary dressing as directed. Secondary Dressing: Woven Gauze Sponge, Non-Sterile 4x4 in 1 x Per Week/30 Days Discharge Instructions: Apply over primary dressing as directed. Com pression Wrap: Urgo K2  Lite, (equivalent to a 3 layer) two layer compression system, regular 1 x Per Week/30 Days Discharge Instructions: Apply Urgo K2 Lite as directed (alternative to 3 layer compression). WOUND #2: - Lower Leg Wound Laterality: Right, Anterior Cleanser: Soap and Water 1 x Per Week/30 Days Discharge Instructions: May shower and wash wound with dial antibacterial soap and water prior to dressing change. Cleanser: Vashe 5.8 (oz) 1 x Per Week/30 Days Discharge Instructions: Cleanse the wound with Vashe prior to applying a clean dressing using gauze sponges, not tissue or cotton balls. Peri-Wound Care: Sween Lotion (Moisturizing lotion) 1 x Per Week/30 Days Discharge Instructions: Apply moisturizing lotion as directed Prim Dressing: Maxorb Extra Ag+ Alginate Dressing, 4x4.75 (in/in) 1 x Per Week/30 Days ary Discharge Instructions: Apply to wound bed as instructed Secondary Dressing: ABD Pad, 8x10 1 x Per Week/30 Days Discharge Instructions: Apply over primary dressing as directed. Secondary Dressing: Woven Gauze Sponge, Non-Sterile 4x4 in 1 x Per Week/30 Days Discharge Instructions: Apply over primary dressing as directed. Com pression Wrap: Urgo K2 Lite, (equivalent to a 3  layer) two layer compression system, regular 1 x Per Week/30 Days Discharge Instructions: Apply Urgo K2 Lite as directed (alternative to 3 layer compression). WOUND #3: - T Great Wound Laterality: Right oe Cleanser: Vashe 5.8 (oz) 1 x Per Day/30 Days Discharge Instructions: Cleanse the wound with Vashe prior to applying a clean dressing using gauze sponges, not tissue or cotton balls. Cleanser: Wound Cleanser 1 x Per Day/30 Days Discharge Instructions: Cleanse the wound with wound cleanser prior to applying a clean dressing using gauze sponges, not tissue or cotton balls. Topical: Gentamicin 1 x Per Day/30 Days [name], [name] ([id number]) [phone].pdf Page 14 of 16 Discharge Instructions: As directed  by physician Topical: Mupirocin Ointment 1 x Per Day/30 Days Discharge Instructions: Apply Mupirocin (Bactroban) as instructed Prim Dressing: Maxorb Extra Ag+ Alginate Dressing, 2x2 (in/in) 1 x Per Day/30 Days ary Discharge Instructions: Apply to wound bed as instructed Secondary Dressing: Optifoam Non-Adhesive Dressing, 4x4 in 1 x Per Day/30 Days Discharge Instructions: Apply over primary dressing foam donut Secondary Dressing: Woven Gauze Sponges 2x2 in 1 x Per Day/30 Days Discharge Instructions: Apply over primary dressing as directed. Secured With: Insurance underwriter, Sterile 2x75 (in/in) 1 x Per Day/30 Days Discharge Instructions: Secure with stretch gauze as directed. Secured With: 44M Medipore H Soft Cloth Surgical T ape, 4 x 10 (in/yd) 1 x Per Day/30 Days Discharge Instructions: Secure with tape as directed. WOUND #4: - Calcaneus Wound Laterality: Left, Posterior Cleanser: Soap and Water 1 x Per Week/30 Days Discharge Instructions: May shower and wash wound with dial antibacterial soap and water prior to dressing change. Cleanser: Vashe 5.8 (oz) 1 x Per Week/30 Days Discharge Instructions: Cleanse the wound with Vashe prior to applying a clean dressing using gauze sponges, not tissue or cotton balls. Peri-Wound Care: Sween Lotion (Moisturizing lotion) 1 x Per Week/30 Days Discharge Instructions: Apply moisturizing lotion as directed Prim Dressing: Maxorb Extra Ag+ Alginate Dressing, 4x4.75 (in/in) 1 x Per Week/30 Days ary Discharge Instructions: Apply to wound bed as instructed Secondary Dressing: ALLEVYN Heel 4 1/2in x 5 1/2in / 10.5cm x 13.5cm 1 x Per Week/30 Days Discharge Instructions: Apply over primary dressing as directed. Secondary Dressing: Woven Gauze Sponge, Non-Sterile 4x4 in 1 x Per Week/30 Days Discharge Instructions: Apply over primary dressing as directed. Com pression Wrap: Urgo K2 Lite, (equivalent to a 3 layer) two layer compression system, regular  1 x Per Week/30 Days Discharge Instructions: Apply Urgo K2 Lite as directed (alternative to 3 layer compression). WOUND #5: - T Fourth Wound Laterality: Left oe Cleanser: Vashe 5.8 (oz) 1 x Per Day/30 Days Discharge Instructions: Cleanse the wound with Vashe prior to applying a clean dressing using gauze sponges, not tissue or cotton balls. Cleanser: Wound Cleanser 1 x Per Day/30 Days Discharge Instructions: Cleanse the wound with wound cleanser prior to applying a clean dressing using gauze sponges, not tissue or cotton balls. Topical: Gentamicin 1 x Per Day/30 Days Discharge Instructions: As directed by physician Topical: Mupirocin Ointment 1 x Per Day/30 Days Discharge Instructions: Apply Mupirocin (Bactroban) as instructed Secondary Dressing: Woven Gauze Sponges 2x2 in 1 x Per Day/30 Days Discharge Instructions: Apply over primary dressing as directed. Secured With: Insurance underwriter, Sterile 2x75 (in/in) 1 x Per Day/30 Days Discharge Instructions: Secure with stretch gauze as directed. Secured With: 44M Medipore H Soft Cloth Surgical T ape, 4 x 10 (in/yd) 1 x Per Day/30 Days Discharge Instructions: Secure with tape as directed. WOUND #6: - Metatarsal head first  Wound Laterality: Left Cleanser: Soap and Water 1 x Per Week/30 Days Discharge Instructions: May shower and wash wound with dial antibacterial soap and water prior to dressing change. Cleanser: Vashe 5.8 (oz) 1 x Per Week/30 Days Discharge Instructions: Cleanse the wound with Vashe prior to applying a clean dressing using gauze sponges, not tissue or cotton balls. Peri-Wound Care: Sween Lotion (Moisturizing lotion) 1 x Per Week/30 Days Discharge Instructions: Apply moisturizing lotion as directed Prim Dressing: Maxorb Extra Ag+ Alginate Dressing, 4x4.75 (in/in) 1 x Per Week/30 Days ary Discharge Instructions: Apply to wound bed as instructed Secondary Dressing: ABD Pad, 8x10 1 x Per Week/30 Days Discharge  Instructions: Apply over primary dressing as directed. Secondary Dressing: Woven Gauze Sponge, Non-Sterile 4x4 in 1 x Per Week/30 Days Discharge Instructions: Apply over primary dressing as directed. Com pression Wrap: Urgo K2 Lite, (equivalent to a 3 layer) two layer compression system, regular 1 x Per Week/30 Days Discharge Instructions: Apply Urgo K2 Lite as directed (alternative to 3 layer compression). 11/13/2022: All of the existing wounds are smaller with the exception of the great toe ulcer on the right. He has a new wound on his left first metatarsal head where it appears the tissue underneath the callus has broken down. It is fairly superficial and clean. He has had his foot x-ray done, but it has not yet been read. I am concerned about the distal tuft of the right great toe. I used a curette to debride slough from the right anterior tibial surface wound, slough and subcutaneous tissue from the great toe ulcer, slough from the left heel ulcer, eschar from the left posterior calf ulcer, and callus and slough from both the new left first metatarsal head wound and left third toe wound. I also chemically cauterized some hypertrophic granulation tissue on the left posterior calf wound with silver nitrate. They are going to continue silver alginate to all sites with bilateral Urgo lite wraps. He did not have his great toe ulcer packed with the silver alginate so we will instruct him on how to do this. He should continue to use the topical gentamicin and mupirocin on the great toe, as well. He will complete his course of Augmentin. Follow-up in 1 week. Electronic Signature(s) Signed: 11/13/2022 11:10:26 AM By: Duanne Guess MD FACS Previous Signature: 11/13/2022 11:09:25 AM Version By: Duanne Guess MD FACS Entered By: Duanne Guess on 11/13/2022 08:10:26 [name] ([id number]) [id number] Page 15 of  16 -------------------------------------------------------------------------------- HxROS Details Patient Name: Date of Service: [name], [name] 11/13/2022 10:30 A M Medical Record Number: [id number] Patient Account Number: [id number] Date of Birth/Sex: Treating RN: [birth year] (77 y.o. M) Primary Care Provider: Sanjuan Dame Other Clinician: Referring Provider: Treating Provider/Extender: Floyce Stakes in Treatment: 1 Information Obtained From Patient Chart Respiratory Medical History: Past Medical History Notes: pulmonary HTN Cardiovascular Medical History: Positive for: Arrhythmia - A. fib; Hypertension; Vasculitis - Leukocytoclastic vasculitis (HCC) Past Medical History Notes: mitral regurgitation Endocrine Medical History: Positive for: Type II Diabetes - hgbA1c 6 Time with diabetes: Dx 77 years old Treated with: Diet Blood sugar tested every day: Yes Tested : once Immunizations Pneumococcal Vaccine: Received Pneumococcal Vaccination: Yes Received Pneumococcal Vaccination On or After 60th Birthday: Yes Implantable Devices No devices added Hospitalization / Surgery History Type of Hospitalization/Surgery cardioversion Family and Social History Diabetes: Yes - Mother,Siblings; Heart Disease: Yes - Father; Hypertension: Yes - Mother; Never smoker; Marital Status - Married; Alcohol Use: Never; Drug  Use: No History; Caffeine Use: Never; Financial Concerns: No; Food, Clothing or Shelter Needs: No; Support System Lacking: No; Transportation Concerns: No Electronic Signature(s) Signed: 11/13/2022 11:20:44 AM By: Duanne Guess MD FACS Entered By: Duanne Guess on 11/13/2022 08:06:33 -------------------------------------------------------------------------------- SuperBill Details Patient Name: Date of Service: [name], [name] 11/13/2022 Medical Record Number: [id number] Patient Account Number: [id number] Date of Birth/Sex:  Treating RN: [birth year], [name] (77 y.o. 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.222 Non-pressure chronic ulcer of left calf with fat layer exposed L97.812 Non-pressure chronic ulcer of other part of right lower leg with fat layer exposed L97.522 Non-pressure chronic ulcer of other part of left foot with fat layer exposed E11.621 Type 2 diabetes mellitus with foot ulcer E11.622 Type 2 diabetes mellitus with other skin ulcer 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) Facility Procedures : CPT4 Code: [id number] Description: 11042 - DEB SUBQ TISSUE 20 SQ CM/< ICD-10 Diagnosis Description L97.512 Non-pressure chronic ulcer of other part of right foot with fat layer exposed Modifier: Quantity: 1 : CPT4 Code: [id number] Description: 97597 - DEBRIDE WOUND 1ST 20 SQ CM OR < ICD-10 Diagnosis Description L97.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 L97.222 Non-pressure chronic ulcer of left calf with fat layer exposed L97.812  Non-pressure chronic ulcer of other part of right lower leg with fat layer expos L97.522 Non-pressure chronic ulcer of other part of left foot with fat layer exposed Modifier: ed Quantity: 1 Physician Procedures : CPT4 Code Description Modifier [id number] 99214 - WC PHYS LEVEL 4 - EST PT 25 ICD-10 Diagnosis Description L97.512 Non-pressure chronic ulcer of other part of right foot with fat layer exposed L97.422 Non-pressure chronic ulcer of left heel and midfoot  with fat layer exposed L97.222 Non-pressure chronic  ulcer of left calf with fat layer exposed L97.812 Non-pressure chronic ulcer of other part of right lower leg with fat layer exposed Quantity: 1 : [id number] 11042 - WC PHYS SUBQ TISS 20 SQ CM ICD-10 Diagnosis Description L97.512 Non-pressure chronic ulcer of other part of right foot with fat layer exposed Quantity: 1 : [id number] 97597 - WC PHYS DEBR WO ANESTH 20 SQ CM ICD-10 Diagnosis Description L97.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

## 2022-11-18 ENCOUNTER — Other Ambulatory Visit (HOSPITAL_BASED_OUTPATIENT_CLINIC_OR_DEPARTMENT_OTHER): Payer: Self-pay | Admitting: Family Medicine

## 2022-11-20 ENCOUNTER — Encounter (HOSPITAL_BASED_OUTPATIENT_CLINIC_OR_DEPARTMENT_OTHER): Payer: No Typology Code available for payment source | Attending: General Surgery | Admitting: General Surgery

## 2022-11-20 DIAGNOSIS — L97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: Secondary | ICD-10-CM | POA: Diagnosis not present

## 2022-11-20 DIAGNOSIS — E11621 Type 2 diabetes mellitus with foot ulcer: Secondary | ICD-10-CM | POA: Diagnosis present

## 2022-11-20 DIAGNOSIS — I4891 Unspecified atrial fibrillation: Secondary | ICD-10-CM | POA: Insufficient documentation

## 2022-11-20 DIAGNOSIS — L97812 Non-pressure chronic ulcer of other part of right lower leg with fat layer exposed: Secondary | ICD-10-CM | POA: Insufficient documentation

## 2022-11-20 DIAGNOSIS — L97522 Non-pressure chronic ulcer of other part of left foot with fat layer exposed: Secondary | ICD-10-CM | POA: Diagnosis not present

## 2022-11-20 DIAGNOSIS — I872 Venous insufficiency (chronic) (peripheral): Secondary | ICD-10-CM | POA: Diagnosis not present

## 2022-11-20 DIAGNOSIS — I10 Essential (primary) hypertension: Secondary | ICD-10-CM | POA: Diagnosis not present

## 2022-11-20 DIAGNOSIS — L97512 Non-pressure chronic ulcer of other part of right foot with fat layer exposed: Secondary | ICD-10-CM | POA: Insufficient documentation

## 2022-11-20 DIAGNOSIS — L97222 Non-pressure chronic ulcer of left calf with fat layer exposed: Secondary | ICD-10-CM | POA: Diagnosis not present

## 2022-11-20 NOTE — Progress Notes (Signed)
CUINN, BECHEL ([id number]) [id number] Page 1 of 17 Visit Report for 11/20/2022 Chief Complaint Document Details Patient Name: Date of Service: [name] CE, RO GER W. 11/20/2022 10:30 A M Medical Record Number: [id number] Patient Account Number: [id number] Date of Birth/Sex: Treating RN: Oct 08, 1945 (77 y.o. 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 11/20/2022 10:30 A M Medical Record Number: [id number] Patient Account Number: [id number] Date of Birth/Sex: Treating RN: January 04, 1946 (77 y.o. Cline Cools Primary Care Provider: Sanjuan Dame Other Clinician: Referring Provider: Treating Provider/Extender: Floyce Stakes in Treatment: 2 Debridement Performed for Assessment: Wound #7 Left T Third oe Performed By: Physician Duanne Guess, MD Debridement Type: Debridement Level of Consciousness (Pre-procedure): Awake and Alert Pre-procedure Verification/Time Out Yes - 11:00 Taken: Start Time: 11:04 Pain Control: Lidocaine 4% Topical Solution Percent of Wound Bed Debrided: 100% T Area Debrided (cm): otal 0.03 Tissue and other material debrided: Non-Viable, Callus Level: Non-Viable Tissue Debridement Description: Selective/Open Wound Instrument: Curette Bleeding: Minimum Hemostasis Achieved: Pressure Response to Treatment: Procedure was tolerated well Level of  Consciousness (Post- Awake and Alert procedure): Post Debridement Measurements of Total Wound Length: (cm) 0.2 Stage: Category/Stage II Width: (cm) 0.2 Depth: (cm) 0.1 Volume: (cm) 0.003 Character of Wound/Ulcer Post Debridement: Improved Post Procedure Diagnosis Same as Pre-procedure [name] ([id number]) [id number] Page 2 of 17 Notes Scribed for DR Lady Gary by Erin Sons, RN Electronic Signature(s) Signed: 11/20/2022 12:16:08 PM By: Duanne Guess MD FACS Signed: 11/20/2022 12:28:55 PM By: Redmond Pulling RN, BSN Entered By: Redmond Pulling on 11/20/2022 11:09:03 -------------------------------------------------------------------------------- Debridement Details Patient Name: Date of Service: [name] CE, RO GER W. 11/20/2022 10:30 A M Medical Record Number: [id number] Patient Account Number: [id number] Date of Birth/Sex: Treating RN: 01-20-1946 (77 y.o. Cline Cools Primary Care Provider: Sanjuan Dame Other Clinician: Referring Provider: Treating Provider/Extender: Floyce Stakes in Treatment: 2 Debridement Performed for Assessment: Wound #5 Left T Fourth oe Performed By: Physician Duanne Guess, MD Debridement Type: Debridement Severity of Tissue Pre Debridement: Fat layer exposed Level of Consciousness (Pre-procedure): Awake and Alert Pre-procedure Verification/Time Out Yes - 11:00 Taken: Start Time: 11:04 Pain Control: Lidocaine 4% Topical Solution Percent of Wound Bed Debrided: 200% T Area Debrided (cm): otal 0.02 Tissue and other material debrided: Non-Viable, Callus Level: Non-Viable Tissue Debridement Description: Selective/Open Wound Instrument: Curette Bleeding: Minimum Hemostasis Achieved: Pressure Response to Treatment: Procedure was tolerated well Level of Consciousness (Post- Awake and Alert procedure): Post Debridement Measurements of Total Wound Length: (cm) 0.1 Width: (cm)  0.1 Depth: (cm) 0.1 Volume: (cm) 0.001 Character of Wound/Ulcer Post Debridement: Improved Severity of Tissue Post Debridement: Fat layer exposed Post Procedure Diagnosis Same as Pre-procedure Notes Scribed for DR Lady Gary by Erin Sons, RN Electronic Signature(s) Signed: 11/20/2022 12:16:08 PM By: Duanne Guess MD FACS Signed: 11/20/2022 12:28:55 PM By: Redmond Pulling RN, BSN Entered By: Redmond Pulling on 11/20/2022 11:09:39 Debridement Details -------------------------------------------------------------------------------- [name] ([id number]) [id number] Page 3 of 17 Patient Name: Date of Service: [name] CE, RO GER W. 11/20/2022 10:30 A M Medical Record  Number: [id number] Patient Account Number: [id number] Date of Birth/Sex: Treating RN: 1945/12/05 (77 y.o. Cline Cools Primary Care Provider: Sanjuan Dame Other Clinician: Referring Provider: Treating Provider/Extender: Floyce Stakes in Treatment: 2 Debridement Performed for Assessment: Wound #6 Left Metatarsal head first Performed By: Physician Duanne Guess, MD Debridement Type: Debridement Severity of Tissue Pre Debridement: Fat layer exposed Level of Consciousness (Pre-procedure): Awake and Alert Pre-procedure Verification/Time Out Yes - 11:00 Taken: Start Time: 11:04 Pain Control: Lidocaine 4% Topical Solution Percent of Wound Bed Debrided: 100% T Area Debrided (cm): otal 0.94 Tissue and other material debrided: Non-Viable, Callus Level: Non-Viable Tissue Debridement Description: Selective/Open Wound Instrument: Curette Bleeding: Minimum Hemostasis Achieved: Pressure Response to Treatment: Procedure was tolerated well Level of Consciousness (Post- Awake and Alert procedure): Post Debridement Measurements of Total Wound Length: (cm) 1.2 Width: (cm) 1 Depth: (cm) 0.1 Volume: (cm) 0.094 Character of Wound/Ulcer Post Debridement:  Improved Severity of Tissue Post Debridement: Fat layer exposed Post Procedure Diagnosis Same as Pre-procedure Notes Scribed for DR Lady Gary by Erin Sons, RN Electronic Signature(s) Signed: 11/20/2022 12:16:08 PM By: Duanne Guess MD FACS Signed: 11/20/2022 12:28:55 PM By: Redmond Pulling RN, BSN Entered By: Redmond Pulling on 11/20/2022 11:10:21 -------------------------------------------------------------------------------- Debridement Details Patient Name: Date of Service: [name] CE, RO GER W. 11/20/2022 10:30 A M Medical Record Number: [id number] Patient Account Number: [id number] Date of Birth/Sex: Treating RN: 07-16-1945 (77 y.o. Cline Cools Primary Care Provider: Sanjuan Dame Other Clinician: Referring Provider: Treating Provider/Extender: Floyce Stakes in Treatment: 2 Debridement Performed for Assessment: Wound #2 Right,Anterior Lower Leg Performed By: Physician Duanne Guess, MD Debridement Type: Debridement Level of Consciousness (Pre-procedure): Awake and Alert Pre-procedure Verification/Time Out Yes - 11:00 Taken: Start Time: 11:04 Pain Control: Lidocaine 4% Topical Solution Percent of Wound Bed Debrided: 100% T Area Debrided (cm): otal 1.02 Tissue and other material debrided: Non-Viable, Eschar Level: Non-Viable Tissue Debridement Description: Selective/Open Wound Instrument: EDEM, THOMASSON ([id number]) [id number] Page 4 of 17 Bleeding: Minimum Hemostasis Achieved: Pressure Response to Treatment: Procedure was tolerated well Level of Consciousness (Post- Awake and Alert procedure): Post Debridement Measurements of Total Wound Length: (cm) 1.3 Width: (cm) 1 Depth: (cm) 0.1 Volume: (cm) 0.102 Character of Wound/Ulcer Post Debridement: Improved Post Procedure Diagnosis Same as Pre-procedure Notes Scribed for DR Lady Gary by Erin Sons, RN Electronic Signature(s) Signed: 11/20/2022  12:16:08 PM By: Duanne Guess MD FACS Signed: 11/20/2022 12:28:55 PM By: Redmond Pulling RN, BSN Entered By: Redmond Pulling on 11/20/2022 11:10:59 -------------------------------------------------------------------------------- Debridement Details Patient Name: Date of Service: [name] CE, RO GER W. 11/20/2022 10:30 A M Medical Record Number: [id number] Patient Account Number: [id number] Date of Birth/Sex: Treating RN: [birth year] (77 y.o. Cline Cools Primary Care Provider: Sanjuan Dame Other Clinician: Referring Provider: Treating Provider/Extender: Floyce Stakes in Treatment: 2 Debridement Performed for Assessment: Wound #3 Right T Great oe Performed By: Physician Duanne Guess, MD Debridement Type: Debridement Severity of Tissue Pre Debridement: Necrosis of bone Level of Consciousness (Pre-procedure): Awake and Alert Pre-procedure Verification/Time Out Yes - 11:00 Taken: Start Time: 11:04 Pain Control: Lidocaine 4% Topical Solution Percent of Wound Bed Debrided: 110% T Area Debrided (cm): otal 0.17 Tissue and other material debrided: Non-Viable, Callus, Slough, Slough Level: Non-Viable Tissue Debridement Description: Selective/Open Wound Instrument: Curette Bleeding: Minimum Hemostasis Achieved: Pressure Response to Treatment: Procedure was tolerated well Level of Consciousness (Post- Awake and Alert procedure): Post Debridement Measurements of Total Wound  Length: (cm) 0.4 Width: (cm) 0.5 Depth: (cm) 0.1 Volume: (cm) 0.016 Character of Wound/Ulcer Post Debridement: Improved Severity of Tissue Post Debridement: Fat layer exposed Post Procedure Diagnosis Same as Pre-procedure Notes Scribed for DR Lady Gary by Erin Sons, RN [name] ([id number]) [phone].pdf Page 5 of 17 Electronic Signature(s) Signed: 11/20/2022 12:16:08 PM By: Duanne Guess MD FACS Signed: 11/20/2022 12:28:55 PM By: Redmond Pulling RN, BSN Entered By: Redmond Pulling on 11/20/2022 11:12:42 -------------------------------------------------------------------------------- Debridement Details Patient Name: Date of Service: [name] CE, RO GER W. 11/20/2022 10:30 A M Medical Record Number: [id number] Patient Account Number: [id number] Date of Birth/Sex: Treating RN: 10/09/1945 (77 y.o. Cline Cools Primary Care Provider: Sanjuan Dame Other Clinician: Referring Provider: Treating Provider/Extender: Floyce Stakes in Treatment: 2 Debridement Performed for Assessment: Wound #4 Left,Posterior Calcaneus Performed By: Physician Duanne Guess, MD Debridement Type: Debridement Severity of Tissue Pre Debridement: Fat layer exposed Level of Consciousness (Pre-procedure): Awake and Alert Pre-procedure Verification/Time Out Yes - 11:00 Taken: Start Time: 11:04 Pain Control: Lidocaine 4% Topical Solution Percent of Wound Bed Debrided: 100% T Area Debrided (cm): otal 0.24 Tissue and other material debrided: Non-Viable, Eschar, Slough, Slough Level: Non-Viable Tissue Debridement Description: Selective/Open Wound Instrument: Curette Bleeding: Minimum Hemostasis Achieved: Pressure Response to Treatment: Procedure was tolerated well Level of Consciousness (Post- Awake and Alert procedure): Post Debridement Measurements of Total Wound Length: (cm) 0.5 Width: (cm) 0.6 Depth: (cm) 0.1 Volume: (cm) 0.024 Character of Wound/Ulcer Post Debridement: Improved Severity of Tissue Post Debridement: Fat layer exposed Post Procedure Diagnosis Same as Pre-procedure Notes Scribed for DR Lady Gary by Erin Sons, RN Electronic Signature(s) Signed: 11/20/2022 12:16:08 PM By: Duanne Guess MD FACS Signed: 11/20/2022 12:28:55 PM By: Redmond Pulling RN, BSN Entered By: Redmond Pulling on 11/20/2022 11:14:09 -------------------------------------------------------------------------------- Debridement  Details Patient Name: Date of Service: [name] CE, RO GER W. 11/20/2022 10:30 A M Medical Record Number: [id number] Patient Account Number: [id number] Date of Birth/Sex: Treating RN: December 27, 1945 (77 y.o. Cline Cools [place], [place] ([id number]) [id number] Page 6 of 17 Primary Care Provider: Sanjuan Dame Other Clinician: Referring Provider: Treating Provider/Extender: Floyce Stakes in Treatment: 2 Debridement Performed for Assessment: Wound #1 Left,Posterior Lower Leg Performed By: Physician Duanne Guess, MD Debridement Type: Debridement Severity of Tissue Pre Debridement: Fat layer exposed Level of Consciousness (Pre-procedure): Awake and Alert Pre-procedure Verification/Time Out Yes - 11:00 Taken: Start Time: 11:04 Pain Control: Lidocaine 4% Topical Solution Percent of Wound Bed Debrided: 100% T Area Debrided (cm): otal 2.36 Tissue and other material debrided: Non-Viable, Eschar, Slough, Slough Level: Non-Viable Tissue Debridement Description: Selective/Open Wound Instrument: Curette Bleeding: Minimum Hemostasis Achieved: Pressure Response to Treatment: Procedure was tolerated well Level of Consciousness (Post- Awake and Alert procedure): Post Debridement Measurements of Total Wound Length: (cm) 3 Width: (cm) 1 Depth: (cm) 0.1 Volume: (cm) 0.236 Character of Wound/Ulcer Post Debridement: Improved Severity of Tissue Post Debridement: Fat layer exposed Post Procedure Diagnosis Same as Pre-procedure Notes Scribed for DR Lady Gary by Erin Sons, RN Electronic Signature(s) Signed: 11/20/2022 12:16:08 PM By: Duanne Guess MD FACS Signed: 11/20/2022 12:28:55 PM By: Redmond Pulling RN, BSN Entered By: Redmond Pulling on 11/20/2022 11:14:47 -------------------------------------------------------------------------------- HPI Details Patient Name: Date of Service: [name] CE, RO GER W. 11/20/2022 10:30 A M Medical  Record Number: [id number] Patient Account Number: [id number] Date of Birth/Sex: Treating RN: 01-Dec-1945 (77 y.o. 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 It appears that he has been receiving some treatment through the [facility] system, based on review of the electronic medical record. Unfortunately, the patient is an extremely poor historian and his recollection of timelines and interventions is somewhat scattered. He has an ulcer on his left heel, left posterior calf, left fourth toe, right great toe, right anterior tibia. Based upon what he had on his wounds when he came to clinic, he has been applying Xeroform to everything. 11/13/2022: All of the existing wounds are smaller with the exception of the great toe ulcer on the right. He has a new wound on his left first metatarsal head where it appears the tissue underneath the callus has broken down. It is fairly superficial and clean. He has had his foot x-ray done, but it has not yet been read. I am concerned about the distal tuft of the right great toe. 11/20/2022: The great toe ulcer on the right is stable. He has reaccumulated the callus over the left first metatarsal head, but there is still an opening underneath this. The left heel ulcer is stable. The left posterior calf wound has a layer of eschar over it, but underneath, the wound is down to just a little bit over a TYBERIOUS, TWETEN ([id number]) [id number] Page 7 of 17 centimeter. The left fourth toe has also accumulated more callus; the wound is a pinpoint opening underneath this. He has  a new wound on the left third toe, also underneath the callus. The right anterior tibial wound has some eschar on the surface, underneath which the wound is nearly healed. His x-ray was read and as expected, osteomyelitis is present. MPRESSION: 1. Soft tissue ulceration at the distal aspect of the great toe with worsening mild-to-moderate erosion within the distal medial greater than distal lateral aspect of the distal phalanx of the great toe compared to 06/01/2022 radiograph. Findings are concerning for osteomyelitis. Note is made of findings of active osteomyelitis within the great toe distal phalanx on 06/17/2022 MRI. 2. Mild erosion of the lateral aspect of the fifth metatarsal head appears similar to 06/01/2022 radiograph. No marrow edema concerning for acute osteomyelitis in this region on prior MRI. Electronic Signature(s) Signed: 11/20/2022 11:45:04 AM By: Duanne Guess MD FACS Entered By: Duanne Guess on 11/20/2022 11:45:04 -------------------------------------------------------------------------------- Physical Exam Details Patient Name: Date of Service: [name] CE, RO GER W. 11/20/2022 10:30 A M Medical Record Number: [id number] Patient Account Number: [id number] Date of Birth/Sex: Treating RN: 1946-02-09 (77 y.o. M) Primary Care Provider: Sanjuan Dame Other Clinician: Referring Provider: Treating Provider/Extender: Floyce Stakes in Treatment: 2 Constitutional . . . . no acute distress. Respiratory Normal work of breathing on room air. Notes 11/20/2022: The great toe ulcer on the right is stable. He has reaccumulated the callus over the left first metatarsal head, but there is still an opening underneath this. The left heel ulcer is stable. The left posterior calf wound has a layer of eschar over it, but underneath, the wound is down to just a little bit over a centimeter. The left fourth toe has also accumulated more callus; the wound  is a pinpoint opening underneath this. He has a new wound on the left third toe, also  underneath the callus. The right anterior tibial wound has some eschar on the surface, underneath which the wound is nearly healed. Electronic Signature(s) Signed: 11/20/2022 11:52:13 AM By: Duanne Guess MD FACS Entered By: Duanne Guess on 11/20/2022 11:52:13 -------------------------------------------------------------------------------- Physician Orders Details Patient Name: Date of Service: [name] CE, RO GER W. 11/20/2022 10:30 A M Medical Record Number: [id number] Patient Account Number: [id number] Date of Birth/Sex: Treating RN: 1945/11/07 (77 y.o. 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 - Dr.Levonia Wolfley Return A Anesthetic (In clinic) Topical Lidocaine 4% applied to wound bed Bathing/ Shower/ Hygiene May shower with protection but do not get wound dressing(s) wet. Protect dressing(s) with water repellant cover (for example, large plastic bag) or a cast cover and may then take shower. Edema  Control - Lymphedema / SCD / Other Elevate legs to the level of the heart or above for 30 minutes daily and/or when sitting for 3-4 times a day throughout the day. Avoid standing for long periods of time. Off-Loading Other: - shoes that the podiatrist gave you Wound Treatment Wound #1 - Lower Leg Wound Laterality: Left, Posterior Cleanser: Soap and Water 1 x Per Week/30 Days Discharge Instructions: May shower and wash wound with dial antibacterial soap and water prior to dressing change. Cleanser: Vashe 5.8 (oz) 1 x Per Week/30 Days Discharge Instructions: Cleanse the wound with Vashe prior to applying a clean dressing using gauze sponges, not tissue or cotton balls. Peri-Wound Care: Sween Lotion (Moisturizing lotion) 1 x Per Week/30 Days Discharge Instructions: Apply moisturizing lotion as directed Prim Dressing: Maxorb Extra Ag+ Alginate Dressing, 4x4.75 (in/in) 1 x Per Week/30 Days ary Discharge Instructions: Apply to wound bed as instructed Secondary Dressing: ABD Pad, 8x10 1 x Per Week/30 Days Discharge Instructions: Apply over primary dressing as directed. Secondary Dressing: Woven Gauze Sponge, Non-Sterile 4x4 in 1 x Per Week/30 Days Discharge Instructions: Apply over primary dressing as directed. Compression Wrap: Urgo K2 Lite, (equivalent to a 3 layer) two layer compression system, regular 1 x Per Week/30 Days Discharge Instructions: Apply Urgo K2 Lite as directed (alternative to 3 layer compression). Wound #2 - Lower Leg Wound Laterality: Right, Anterior Cleanser: Soap and Water 1 x Per Week/30 Days Discharge Instructions: May shower and wash wound with dial antibacterial soap and water prior to dressing change. Cleanser: Vashe 5.8 (oz) 1 x Per Week/30 Days Discharge Instructions: Cleanse the wound with Vashe prior to applying a clean dressing using gauze sponges, not tissue or cotton balls. Peri-Wound Care: Sween Lotion (Moisturizing lotion) 1 x Per Week/30 Days Discharge  Instructions: Apply moisturizing lotion as directed Prim Dressing: Maxorb Extra Ag+ Alginate Dressing, 4x4.75 (in/in) 1 x Per Week/30 Days ary Discharge Instructions: Apply to wound bed as instructed Secondary  Dressing: ABD Pad, 8x10 1 x Per Week/30 Days Discharge Instructions: Apply over primary dressing as directed. Secondary Dressing: Woven Gauze Sponge, Non-Sterile 4x4 in 1 x Per Week/30 Days Discharge Instructions: Apply over primary dressing as directed. Compression Wrap: Urgo K2 Lite, (equivalent to a 3 layer) two layer compression system, regular 1 x Per Week/30 Days Discharge Instructions: Apply Urgo K2 Lite as directed (alternative to 3 layer compression). Wound #3 - T Great oe Wound Laterality: Right Cleanser: Vashe 5.8 (oz) 1 x Per Day/30 Days Discharge Instructions: Cleanse the wound with Vashe prior to applying a clean dressing using gauze sponges, not tissue or cotton balls. Cleanser: Wound Cleanser 1 x Per Day/30 Days Discharge Instructions: Cleanse the wound with wound cleanser prior to applying a clean dressing using gauze sponges, not tissue or cotton balls. SHADRACK, CARABELLO ([id number]) [id number] Page 9 of 17 Topical: Gentamicin 1 x Per Day/30 Days Discharge Instructions: As directed by physician Topical: Mupirocin Ointment 1 x Per Day/30 Days Discharge Instructions: Apply Mupirocin (Bactroban) as instructed Prim Dressing: Maxorb Extra Ag+ Alginate Dressing, 2x2 (in/in) 1 x Per Day/30 Days ary Discharge Instructions: Apply to wound bed as instructed Secondary Dressing: Optifoam Non-Adhesive Dressing, 4x4 in 1 x Per Day/30 Days Discharge Instructions: Apply over primary dressing foam donut Secondary Dressing: Woven Gauze Sponges 2x2 in 1 x Per Day/30 Days Discharge Instructions: Apply over primary dressing as directed. Secured With: Insurance underwriter, Sterile 2x75 (in/in) 1 x Per Day/30 Days Discharge Instructions: Secure  with stretch gauze as directed. Secured With: 79M Medipore H Soft Cloth Surgical T ape, 4 x 10 (in/yd) 1 x Per Day/30 Days Discharge Instructions: Secure with tape as directed. Wound #4 - Calcaneus Wound Laterality: Left, Posterior Cleanser: Soap and Water 1 x Per Week/30 Days Discharge Instructions: May shower and wash wound with dial antibacterial soap and water prior to dressing change. Cleanser: Vashe 5.8 (oz) 1 x Per Week/30 Days Discharge Instructions: Cleanse the wound with Vashe prior to applying a clean dressing using gauze sponges, not tissue or cotton balls. Peri-Wound Care: Sween Lotion (Moisturizing lotion) 1 x Per Week/30 Days Discharge Instructions: Apply moisturizing lotion as directed Prim Dressing: Maxorb Extra Ag+ Alginate Dressing, 4x4.75 (in/in) 1 x Per Week/30 Days ary Discharge Instructions: Apply to wound bed as instructed Secondary Dressing: ALLEVYN Heel 4 1/2in x 5 1/2in / 10.5cm x 13.5cm 1 x Per Week/30 Days Discharge Instructions: Apply over primary dressing as directed. Secondary Dressing: Woven Gauze Sponge, Non-Sterile 4x4 in 1 x Per Week/30 Days Discharge Instructions: Apply over primary dressing as directed. Compression Wrap: Urgo K2 Lite, (equivalent to a 3 layer) two layer compression system, regular 1 x Per Week/30 Days Discharge Instructions: Apply Urgo K2 Lite as directed (alternative to 3 layer compression). Wound #5 - T Fourth oe Wound Laterality: Left Cleanser: Vashe 5.8 (oz) 1 x Per Day/30 Days Discharge Instructions: Cleanse the wound with Vashe prior to applying a clean dressing using gauze sponges, not tissue or cotton balls. Cleanser: Wound Cleanser 1 x Per Day/30 Days Discharge Instructions: Cleanse the wound with wound cleanser prior to applying a clean dressing using gauze sponges, not tissue or cotton balls. Secondary Dressing: Woven Gauze Sponges 2x2 in 1 x Per Day/30 Days Discharge Instructions: Apply over primary dressing as  directed. Secured With: Insurance underwriter, Sterile 2x75 (in/in) 1 x Per Day/30 Days Discharge Instructions: Secure with stretch gauze as directed. Secured With: 79M Medipore H Soft Cloth Surgical T ape, 4 x 10 (in/yd)  1 x Per Day/30 Days Discharge Instructions: Secure with tape as directed. Wound #6 - Metatarsal head first Wound Laterality: Left Cleanser: Soap and Water 1 x Per Week/30 Days Discharge Instructions: May shower and wash wound with dial antibacterial soap and water prior to dressing change. Cleanser: Vashe 5.8 (oz) 1 x Per Week/30 Days Discharge Instructions: Cleanse the wound with Vashe prior to applying a clean dressing using gauze sponges, not tissue or cotton balls. Peri-Wound Care: Sween Lotion (Moisturizing lotion) 1 x Per Week/30 Days Discharge Instructions: Apply moisturizing lotion as directed Prim Dressing: Maxorb Extra Ag+ Alginate Dressing, 4x4.75 (in/in) 1 x Per Week/30 Days ary Discharge Instructions: Apply to wound bed as instructed Secondary Dressing: ABD Pad, 8x10 1 x Per Week/30 Days Discharge Instructions: Apply over primary dressing as directed. JOHNJOSEPH, HEAPHY ([id number]) [id number] Page 10 of 17 Secondary Dressing: Woven Gauze Sponge, Non-Sterile 4x4 in 1 x Per Week/30 Days Discharge Instructions: Apply over primary dressing as directed. Compression Wrap: Urgo K2 Lite, (equivalent to a 3 layer) two layer compression system, regular 1 x Per Week/30 Days Discharge Instructions: Apply Urgo K2 Lite as directed (alternative to 3 layer compression). Wound #7 - T Third oe Wound Laterality: Left Cleanser: Vashe 5.8 (oz) 1 x Per Day/30 Days Discharge Instructions: Cleanse the wound with Vashe prior to applying a clean dressing using gauze sponges, not tissue or cotton balls. Cleanser: Wound Cleanser 1 x Per Day/30 Days Discharge Instructions: Cleanse the wound with wound cleanser prior to applying a clean dressing using  gauze sponges, not tissue or cotton balls. Topical: Gentamicin 1 x Per Day/30 Days Discharge Instructions: As directed by physician Topical: Mupirocin Ointment 1 x Per Day/30 Days Discharge Instructions: Apply Mupirocin (Bactroban) as instructed Secondary Dressing: Woven Gauze Sponges 2x2 in 1 x Per Day/30 Days Discharge Instructions: Apply over primary dressing as directed. Secured With: Insurance underwriter, Sterile 2x75 (in/in) 1 x Per Day/30 Days Discharge Instructions: Secure with stretch gauze as directed. Secured With: 19M Medipore H Soft Cloth Surgical T ape, 4 x 10 (in/yd) 1 x Per Day/30 Days Discharge Instructions: Secure with tape as directed. Patient Medications llergies: atorvastatin, lisinopril A Notifications Medication Indication Start End 11/20/2022 lidocaine DOSE topical 5 % ointment - ointment topical once daily Electronic Signature(s) Signed: 11/20/2022 12:16:08 PM By: Duanne Guess MD FACS Entered By: Duanne Guess on 11/20/2022 11:53:21 -------------------------------------------------------------------------------- Problem List Details Patient Name: Date of Service: [name] CE, RO GER W. 11/20/2022 10:30 A M Medical Record Number: [id number] Patient Account Number: [id number] Date of Birth/Sex: Treating RN: Feb 17, 1946 (77 y.o. M) Primary Care Provider: Sanjuan Dame Other Clinician: Referring Provider: Treating Provider/Extender: Floyce Stakes in Treatment: 2 Active Problems ICD-10 Encounter Code Description Active Date MDM Diagnosis L97.512 Non-pressure chronic ulcer of other part of right foot with fat layer exposed 11/06/2022 No Yes L97.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 11/06/2022 No Yes L97.222 Non-pressure chronic ulcer of left calf with fat layer exposed 11/06/2022 No Yes MARKEITH, BORDONARO ([id number]) [id number] Page 11 of [phone]  Non-pressure chronic ulcer of other part of right lower leg with fat layer 11/06/2022 No Yes exposed L97.522 Non-pressure chronic ulcer of other part of left foot with fat layer exposed 11/06/2022 No Yes E11.621 Type 2 diabetes mellitus with foot ulcer 11/06/2022 No Yes E11.622 Type 2 diabetes mellitus with other skin ulcer 11/06/2022 No Yes 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) 11/06/2022 No Yes Inactive Problems Resolved  Problems Electronic Signature(s) Signed: 11/20/2022 11:39:27 AM By: Duanne Guess MD FACS Entered By: Duanne Guess on 11/20/2022 11:39:27 -------------------------------------------------------------------------------- Progress Note Details Patient Name: Date of Service: [name] CE, RO GER W. 11/20/2022 10:30 A M Medical Record Number: [id number] Patient Account Number: [id number] Date of Birth/Sex: Treating RN: 10-13-1945 (77 y.o. M) Primary Care Provider: Sanjuan Dame Other Clinician: Referring Provider: Treating Provider/Extender: Floyce Stakes in Treatment: 2 Subjective Chief Complaint Information obtained from Patient Patients presents for treatment of multiple open diabetic ulcers on the feet and lower legs History of Present Illness (HPI) ADMISSION 11/06/2022 ***ABIs LEFT 1.11; RIGHT 1.17*** This is a 77 year old well-controlled diabetic (last hemoglobin A1c 6.2%) who presents for multiple diabetic foot ulcers and leg ulcers. It appears that he has been receiving some treatment through the [facility] system, based on review of the electronic medical record. Unfortunately, the patient is an extremely poor historian and his recollection of timelines and interventions is somewhat scattered. He has an ulcer on his left heel, left posterior calf, left fourth toe, right great toe, right anterior tibia. Based upon what he had on his wounds when he came to clinic, he has been applying Xeroform to  everything. 11/13/2022: All of the existing wounds are smaller with the exception of the great toe ulcer on the right. He has a new wound on his left first metatarsal head where it appears the tissue underneath the callus has broken down. It is fairly superficial and clean. He has had his foot x-ray done, but it has not yet been read. I am concerned about the distal tuft of the right great toe. 11/20/2022: The great toe ulcer on the right is stable. He has reaccumulated the callus over the left first metatarsal head, but there is still an opening underneath this. The left heel ulcer is stable. The left posterior calf wound has a layer of eschar over it, but underneath, the wound is down to just a little bit over a centimeter. The left fourth toe has also accumulated more callus; the wound is a pinpoint opening underneath this. He has a new wound on the left third toe, also underneath the callus. The right anterior tibial wound has some eschar on the surface, underneath which the wound is nearly healed. His x-ray was read and as expected, osteomyelitis is present. MPRESSION: 1. Soft tissue ulceration at the distal aspect of the great toe with worsening mild-to-moderate erosion within the distal medial greater than distal lateral aspect of the distal phalanx of the great toe compared to 06/01/2022 radiograph. Findings are concerning for EVIE, WILTSIE ([id number]) [id number] Page 12 of 17 osteomyelitis. Note is made of findings of active osteomyelitis within the great toe distal phalanx on 06/17/2022 MRI. 2. Mild erosion of the lateral aspect of the fifth metatarsal head appears similar to 06/01/2022 radiograph. No marrow edema concerning for acute osteomyelitis in this region on prior MRI. Patient History Information obtained from Patient, Chart. Family History Diabetes - Mother,Siblings, Heart Disease - Father, Hypertension - Mother. Social History Never smoker,  Marital Status - Married, Alcohol Use - Never, Drug Use - No History, Caffeine Use - Never. Medical History Cardiovascular Patient has history of Arrhythmia - A. fib, Hypertension, Vasculitis - Leukocytoclastic vasculitis (HCC) Endocrine Patient has history of Type II Diabetes - hgbA1c 6 Hospitalization/Surgery History - cardioversion. Medical A Surgical History Notes nd Respiratory pulmonary HTN Cardiovascular mitral regurgitation Objective Constitutional no acute distress. Vitals Time Taken: 10:44  AM, Height: 73 in, Weight: 192 lbs, BMI: 25.3, Temperature: 98.1 F, Pulse: 76 bpm, Respiratory Rate: 18 breaths/min, Blood Pressure: 129/69 mmHg. Respiratory Normal work of breathing on room air. General Notes: 11/20/2022: The great toe ulcer on the right is stable. He has reaccumulated the callus over the left first metatarsal head, but there is still an opening underneath this. The left heel ulcer is stable. The left posterior calf wound has a layer of eschar over it, but underneath, the wound is down to just a little bit over a centimeter. The left fourth toe has also accumulated more callus; the wound is a pinpoint opening underneath this. He has a new wound on the left third toe, also underneath the callus. The right anterior tibial wound has some eschar on the surface, underneath which the wound is nearly healed. Integumentary (Hair, Skin) Wound #1 status is Open. Original cause of wound was Gradually Appeared. The date acquired was: 08/15/2022. The wound has been in treatment 2 weeks. The wound is located on the Left,Posterior Lower Leg. The wound measures 3cm length x 1cm width x 0.1cm depth; 2.356cm^2 area and 0.236cm^3 volume. There is Fat Layer (Subcutaneous Tissue) exposed. There is no tunneling or undermining noted. There is a medium amount of serosanguineous drainage noted. The wound margin is distinct with the outline attached to the wound base. There is no granulation within the  wound bed. There is a large (67-100%) amount of necrotic tissue within the wound bed including Eschar and Adherent Slough. The periwound skin appearance exhibited: Dry/Scaly, Hemosiderin Staining. The periwound skin appearance did not exhibit: Callus, Crepitus, Excoriation, Induration, Rash, Scarring, Maceration, Atrophie Blanche, Cyanosis, Ecchymosis, Mottled, Pallor, Rubor, Erythema. Wound #2 status is Open. Original cause of wound was Trauma. The date acquired was: 11/02/2022. The wound has been in treatment 2 weeks. The wound is located on the Right,Anterior Lower Leg. The wound measures 1.3cm length x 1cm width x 0.1cm depth; 1.021cm^2 area and 0.102cm^3 volume. There is Fat Layer (Subcutaneous Tissue) exposed. There is no tunneling or undermining noted. There is a small amount of serosanguineous drainage noted. The wound margin is distinct with the outline attached to the wound base. There is no granulation within the wound bed. There is a large (67-100%) amount of necrotic tissue within the wound bed. The periwound skin appearance exhibited: Hemosiderin Staining. The periwound skin appearance did not exhibit: Callus, Crepitus, Excoriation, Induration, Rash, Scarring, Dry/Scaly, Maceration, Atrophie Blanche, Cyanosis, Ecchymosis, Mottled, Pallor, Rubor, Erythema. Periwound temperature was noted as No Abnormality. Wound #3 status is Open. Original cause of wound was Gradually Appeared. The date acquired was: 07/15/2022. The wound has been in treatment 2 weeks. The wound is located on the Right T Great. The wound measures 0.5cm length x 0.4cm width x 1.4cm depth; 0.157cm^2 area and 0.22cm^3 volume. There is bone oe and Fat Layer (Subcutaneous Tissue) exposed. There is no tunneling or undermining noted. There is a medium amount of serosanguineous drainage noted. The wound margin is distinct with the outline attached to the wound base. There is large (67-100%) granulation within the wound bed. There is  a small (1-33%) amount of necrotic tissue within the wound bed. The periwound skin appearance had no abnormalities noted for moisture. The periwound skin appearance exhibited: Callus, Erythema. The periwound skin appearance did not exhibit: Crepitus, Excoriation, Induration, Rash, Scarring, Atrophie Blanche, Cyanosis, Ecchymosis, Hemosiderin Staining, Mottled, Pallor, Rubor. The surrounding wound skin color is noted with erythema which is circumferential with red streaks. Periwound temperature was  noted as No Abnormality. Wound #4 status is Open. Original cause of wound was Gradually Appeared. The date acquired was: 08/18/2022. The wound has been in treatment 2 weeks. The wound is located on the Left,Posterior Calcaneus. The wound measures 0.5cm length x 0.6cm width x 0.1cm depth; 0.236cm^2 area and 0.024cm^3 volume. There is Fat Layer (Subcutaneous Tissue) exposed. There is no tunneling or undermining noted. There is a medium amount of serosanguineous drainage noted. The wound margin is distinct with the outline attached to the wound base. There is large (67-100%) red granulation within the wound bed. There is a small (1-33%) amount of necrotic tissue within the wound bed including Adherent Slough. The periwound skin appearance did not exhibit: Callus, Crepitus, Excoriation, Induration, Rash, Scarring, Dry/Scaly, Maceration, Atrophie Blanche, Cyanosis, Ecchymosis, Hemosiderin Staining, Mottled, Pallor, Rubor, Erythema. PAIGE, LACOUR ([id number]) [id number] Page 13 of 17 Wound #5 status is Open. Original cause of wound was Pressure Injury. The date acquired was: 11/06/2022. The wound has been in treatment 2 weeks. The wound is located on the Left T Fourth. The wound measures 0.1cm length x 0.1cm width x 0.1cm depth; 0.008cm^2 area and 0.001cm^3 volume. There is Fat oe Layer (Subcutaneous Tissue) exposed. There is no tunneling or undermining noted. There is a none present  amount of drainage noted. The wound margin is distinct with the outline attached to the wound base. There is no granulation within the wound bed. There is a large (67-100%) amount of necrotic tissue within the wound bed including Eschar. The periwound skin appearance exhibited: Callus. The periwound skin appearance did not exhibit: Crepitus, Excoriation, Induration, Rash, Scarring, Dry/Scaly, Maceration, Atrophie Blanche, Cyanosis, Ecchymosis, Hemosiderin Staining, Mottled, Pallor, Rubor, Erythema. Wound #6 status is Open. Original cause of wound was Gradually Appeared. The date acquired was: 11/13/2022. The wound has been in treatment 1 weeks. The wound is located on the Left Metatarsal head first. The wound measures 0.1cm length x 0.1cm width x 0.1cm depth; 0.008cm^2 area and 0.001cm^3 volume. There is Fat Layer (Subcutaneous Tissue) exposed. There is no tunneling or undermining noted. There is a medium amount of serosanguineous drainage noted. The wound margin is distinct with the outline attached to the wound base. There is no granulation within the wound bed. There is a large (67-100%) amount of necrotic tissue within the wound bed including Eschar. The periwound skin appearance had no abnormalities noted for moisture. The periwound skin appearance had no abnormalities noted for color. The periwound skin appearance exhibited: Callus. Periwound temperature was noted as No Abnormality. Wound #7 status is Open. Original cause of wound was Gradually Appeared. The date acquired was: 11/20/2022. The wound is located on the Left T Third. The oe wound measures 0.2cm length x 0.2cm width x 0.1cm depth; 0.031cm^2 area and 0.003cm^3 volume. There is Fat Layer (Subcutaneous Tissue) exposed. There is no tunneling or undermining noted. There is a medium amount of serosanguineous drainage noted. There is no granulation within the wound bed. There is a large (67-100%) amount of necrotic tissue within the wound bed  including Eschar. The periwound skin appearance exhibited: Callus. 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 There was a Selective/Open Wound Non-Viable Tissue Debridement with a total area of 2.36 sq cm performed by Duanne Guess, MD. With the following instrument(s): Curette to remove Non-Viable tissue/material. Material removed includes Eschar and Slough and after achieving pain control using Lidocaine 4% T opical Solution. No specimens were taken. A time out was conducted at 11:00, prior to the start of the procedure. A Minimum amount of bleeding was controlled with Pressure. The procedure was tolerated well. Post Debridement Measurements: 3cm length x 1cm width x 0.1cm depth; 0.236cm^3 volume. Character of Wound/Ulcer Post Debridement is improved. Severity of Tissue Post Debridement is: Fat layer exposed. Post procedure Diagnosis Wound #1: Same as Pre-Procedure General Notes: Scribed for DR Lady Gary by Erin Sons, RN. Wound #2 Pre-procedure diagnosis of Wound #2 is a Skin T located on the Right,Anterior Lower Leg . There was a Selective/Open Wound Non-Viable Tissue ear Debridement with a total area of 1.02 sq cm performed by Duanne Guess, MD. With the following instrument(s): Curette to remove  Non-Viable tissue/material. Material removed includes Eschar after achieving pain control using Lidocaine 4% Topical Solution. No specimens were taken. A time out was conducted at 11:00, prior to the start of the procedure. A Minimum amount of bleeding was controlled with Pressure. The procedure was tolerated well. Post Debridement Measurements: 1.3cm length x 1cm width x 0.1cm depth; 0.102cm^3 volume. Character of Wound/Ulcer Post Debridement is improved. Post procedure Diagnosis Wound #2: Same as Pre-Procedure General Notes: Scribed for DR Lady Gary by Erin Sons, RN. Wound #3 Pre-procedure diagnosis of Wound #3 is a Diabetic Wound/Ulcer of the Lower Extremity located on the Right T Great .Severity of Tissue Pre Debridement is: oe Necrosis of bone. There was a Selective/Open Wound Non-Viable Tissue Debridement with a total area of 0.17 sq cm performed by Duanne Guess, MD. With the following instrument(s): Curette to remove Non-Viable tissue/material. Material removed includes Callus and Slough and after achieving pain control using Lidocaine 4% T opical Solution. No specimens were taken. A time out was conducted at 11:00, prior to the start of the procedure. A Minimum amount of bleeding was controlled with Pressure. The procedure was tolerated well. Post Debridement Measurements: 0.4cm length x 0.5cm width x 0.1cm depth; 0.016cm^3 volume. Character of Wound/Ulcer Post Debridement is improved. Severity of Tissue Post Debridement is: Fat layer exposed. Post procedure Diagnosis Wound #3: Same as Pre-Procedure General Notes: Scribed for DR Lady Gary by Erin Sons, RN. Wound #4 Pre-procedure diagnosis of Wound #4 is a Diabetic Wound/Ulcer of the Lower Extremity located on the Left,Posterior Calcaneus .Severity of Tissue Pre Debridement is: Fat layer exposed. There was a Selective/Open Wound Non-Viable Tissue Debridement with a total area of 0.24 sq cm performed by Duanne Guess, MD. With the following  instrument(s): Curette to remove Non-Viable tissue/material. Material removed includes Eschar and Slough and after achieving pain control using Lidocaine 4% T opical Solution. No specimens were taken. A time out was conducted at 11:00, prior to the start of the procedure. A Minimum amount of bleeding was controlled with Pressure. The procedure was tolerated well. Post Debridement Measurements: 0.5cm length x 0.6cm width x 0.1cm depth; 0.024cm^3 volume. Character of Wound/Ulcer Post Debridement is improved. Severity of Tissue Post Debridement is: Fat layer exposed. Post procedure Diagnosis Wound #  4: Same as Pre-Procedure General Notes: Scribed for DR Lady Gary by Erin Sons, RN. Wound #5 Pre-procedure diagnosis of Wound #5 is a Diabetic Wound/Ulcer of the Lower Extremity located on the Left T Fourth .Severity of Tissue Pre Debridement is: oe Fat layer exposed. There was a Selective/Open Wound Non-Viable Tissue Debridement with a total area of 0.02 sq cm performed by Duanne Guess, MD. With the following instrument(s): Curette to remove Non-Viable tissue/material. Material removed includes Callus after achieving pain control using Lidocaine 4% T opical Solution. No specimens were taken. A time out was conducted at 11:00, prior to the start of the procedure. A Minimum amount of bleeding was MASANOBU, COLAS ([id number]) [phone].pdf Page 14 of 17 controlled with Pressure. The procedure was tolerated well. Post Debridement Measurements: 0.1cm length x 0.1cm width x 0.1cm depth; 0.001cm^3 volume. Character of Wound/Ulcer Post Debridement is improved. Severity of Tissue Post Debridement is: Fat layer exposed. Post procedure Diagnosis Wound #5: Same as Pre-Procedure General Notes: Scribed for DR Lady Gary by Erin Sons, RN. Wound #6 Pre-procedure diagnosis of Wound #6 is a Diabetic Wound/Ulcer of the Lower Extremity located on the Left Metatarsal head first .Severity of Tissue  Pre Debridement is: Fat layer exposed. There was a Selective/Open Wound Non-Viable Tissue Debridement with a total area of 0.94 sq cm performed by Duanne Guess, MD. With the following instrument(s): Curette to remove Non-Viable tissue/material. Material removed includes Callus after achieving pain control using Lidocaine 4% T opical Solution. No specimens were taken. A time out was conducted at 11:00, prior to the start of the procedure. A Minimum amount of bleeding was controlled with Pressure. The procedure was tolerated well. Post Debridement Measurements: 1.2cm length x 1cm width x 0.1cm depth; 0.094cm^3 volume. Character of Wound/Ulcer Post Debridement is improved. Severity of Tissue Post Debridement is: Fat layer exposed. Post procedure Diagnosis Wound #6: Same as Pre-Procedure General Notes: Scribed for DR Lady Gary by Erin Sons, RN. Wound #7 Pre-procedure diagnosis of Wound #7 is a Pressure Ulcer located on the Left T Third . There was a Selective/Open Wound Non-Viable Tissue Debridement oe with a total area of 0.03 sq cm performed by Duanne Guess, MD. With the following instrument(s): Curette to remove Non-Viable tissue/material. Material removed includes Callus after achieving pain control using Lidocaine 4% Topical Solution. No specimens were taken. A time out was conducted at 11:00, prior to the start of the procedure. A Minimum amount of bleeding was controlled with Pressure. The procedure was tolerated well. Post Debridement Measurements: 0.2cm length x 0.2cm width x 0.1cm depth; 0.003cm^3 volume. Post debridement Stage noted as Category/Stage II. Character of Wound/Ulcer Post Debridement is improved. Post procedure Diagnosis Wound #7: Same as Pre-Procedure General Notes: Scribed for DR Lady Gary by Erin Sons, RN. Plan Follow-up Appointments: Return Appointment in 1 week. - Dr.Henley Blyth Anesthetic: (In clinic) Topical Lidocaine 4% applied to wound bed Bathing/ Shower/ Hygiene: May  shower with protection but do not get wound dressing(s) wet. Protect dressing(s) with water repellant cover (for example, large plastic bag) or a cast cover and may then take shower. Edema Control - Lymphedema / SCD / Other: Elevate legs to the level of the heart or above for 30 minutes daily and/or when sitting for 3-4 times a day throughout the day. Avoid standing for long periods of time. Off-Loading: Other: - shoes that the podiatrist gave you The following medication(s) was prescribed: lidocaine topical 5 % ointment ointment topical once daily was prescribed at facility WOUND #1: - Lower Leg Wound Laterality:  Left, Posterior Cleanser: Soap and Water 1 x Per Week/30 Days Discharge Instructions: May shower and wash wound with dial antibacterial soap and water prior to dressing change. Cleanser: Vashe 5.8 (oz) 1 x Per Week/30 Days Discharge Instructions: Cleanse the wound with Vashe prior to applying a clean dressing using gauze sponges, not tissue or cotton balls. Peri-Wound Care: Sween Lotion (Moisturizing lotion) 1 x Per Week/30 Days Discharge Instructions: Apply moisturizing lotion as directed Prim Dressing: Maxorb Extra Ag+ Alginate Dressing, 4x4.75 (in/in) 1 x Per Week/30 Days ary Discharge Instructions: Apply to wound bed as instructed Secondary Dressing: ABD Pad, 8x10 1 x Per Week/30 Days Discharge Instructions: Apply over primary dressing as directed. Secondary Dressing: Woven Gauze Sponge, Non-Sterile 4x4 in 1 x Per Week/30 Days Discharge Instructions: Apply over primary dressing as directed. Com pression Wrap: Urgo K2 Lite, (equivalent to a 3 layer) two layer compression system, regular 1 x Per Week/30 Days Discharge Instructions: Apply Urgo K2 Lite as directed (alternative to 3 layer compression). WOUND #2: - Lower Leg Wound Laterality: Right, Anterior Cleanser: Soap and Water 1 x Per Week/30 Days Discharge Instructions: May shower and wash wound with dial antibacterial soap  and water prior to dressing change. Cleanser: Vashe 5.8 (oz) 1 x Per Week/30 Days Discharge Instructions: Cleanse the wound with Vashe prior to applying a clean dressing using gauze sponges, not tissue or cotton balls. Peri-Wound Care: Sween Lotion (Moisturizing lotion) 1 x Per Week/30 Days Discharge Instructions: Apply moisturizing lotion as directed Prim Dressing: Maxorb Extra Ag+ Alginate Dressing, 4x4.75 (in/in) 1 x Per Week/30 Days ary Discharge Instructions: Apply to wound bed as instructed Secondary Dressing: ABD Pad, 8x10 1 x Per Week/30 Days Discharge Instructions: Apply over primary dressing as directed. Secondary Dressing: Woven Gauze Sponge, Non-Sterile 4x4 in 1 x Per Week/30 Days Discharge Instructions: Apply over primary dressing as directed. Com pression Wrap: Urgo K2 Lite, (equivalent to a 3 layer) two layer compression system, regular 1 x Per Week/30 Days Discharge Instructions: Apply Urgo K2 Lite as directed (alternative to 3 layer compression). WOUND #3: - T Great Wound Laterality: Right oe Cleanser: Vashe 5.8 (oz) 1 x Per Day/30 Days Discharge Instructions: Cleanse the wound with Vashe prior to applying a clean dressing using gauze sponges, not tissue or cotton balls. Cleanser: Wound Cleanser 1 x Per Day/30 Days Discharge Instructions: Cleanse the wound with wound cleanser prior to applying a clean dressing using gauze sponges, not tissue or cotton balls. Topical: Gentamicin 1 x Per Day/30 Days Discharge Instructions: As directed by physician Topical: Mupirocin Ointment 1 x Per Day/30 Days Discharge Instructions: Apply Mupirocin (Bactroban) as instructed Prim Dressing: Maxorb Extra Ag+ Alginate Dressing, 2x2 (in/in) 1 x Per Day/30 Days ary Discharge Instructions: Apply to wound bed as instructed Secondary Dressing: Optifoam Non-Adhesive Dressing, 4x4 in 1 x Per Day/30 Days Discharge Instructions: Apply over primary dressing foam donut Secondary Dressing: Woven Gauze  Sponges 2x2 in 1 x Per Day/30 Days Discharge Instructions: Apply over primary dressing as directed. ONECIMO, PURCHASE ([id number]) [id number] Page 15 of 17 Secured With: Insurance underwriter, Sterile 2x75 (in/in) 1 x Per Day/30 Days Discharge Instructions: Secure with stretch gauze as directed. Secured With: 55M Medipore H Soft Cloth Surgical T ape, 4 x 10 (in/yd) 1 x Per Day/30 Days Discharge Instructions: Secure with tape as directed. WOUND #4: - Calcaneus Wound Laterality: Left, Posterior Cleanser: Soap and Water 1 x Per Week/30 Days Discharge Instructions: May shower and wash wound with dial antibacterial soap and water  prior to dressing change. Cleanser: Vashe 5.8 (oz) 1 x Per Week/30 Days Discharge Instructions: Cleanse the wound with Vashe prior to applying a clean dressing using gauze sponges, not tissue or cotton balls. Peri-Wound Care: Sween Lotion (Moisturizing lotion) 1 x Per Week/30 Days Discharge Instructions: Apply moisturizing lotion as directed Prim Dressing: Maxorb Extra Ag+ Alginate Dressing, 4x4.75 (in/in) 1 x Per Week/30 Days ary Discharge Instructions: Apply to wound bed as instructed Secondary Dressing: ALLEVYN Heel 4 1/2in x 5 1/2in / 10.5cm x 13.5cm 1 x Per Week/30 Days Discharge Instructions: Apply over primary dressing as directed. Secondary Dressing: Woven Gauze Sponge, Non-Sterile 4x4 in 1 x Per Week/30 Days Discharge Instructions: Apply over primary dressing as directed. Com pression Wrap: Urgo K2 Lite, (equivalent to a 3 layer) two layer compression system, regular 1 x Per Week/30 Days Discharge Instructions: Apply Urgo K2 Lite as directed (alternative to 3 layer compression). WOUND #5: - T Fourth Wound Laterality: Left oe Cleanser: Vashe 5.8 (oz) 1 x Per Day/30 Days Discharge Instructions: Cleanse the wound with Vashe prior to applying a clean dressing using gauze sponges, not tissue or cotton balls. Cleanser: Wound  Cleanser 1 x Per Day/30 Days Discharge Instructions: Cleanse the wound with wound cleanser prior to applying a clean dressing using gauze sponges, not tissue or cotton balls. Secondary Dressing: Woven Gauze Sponges 2x2 in 1 x Per Day/30 Days Discharge Instructions: Apply over primary dressing as directed. Secured With: Insurance underwriter, Sterile 2x75 (in/in) 1 x Per Day/30 Days Discharge Instructions: Secure with stretch gauze as directed. Secured With: 53M Medipore H Soft Cloth Surgical T ape, 4 x 10 (in/yd) 1 x Per Day/30 Days Discharge Instructions: Secure with tape as directed. WOUND #6: - Metatarsal head first Wound Laterality: Left Cleanser: Soap and Water 1 x Per Week/30 Days Discharge Instructions: May shower and wash wound with dial antibacterial soap and water prior to dressing change. Cleanser: Vashe 5.8 (oz) 1 x Per Week/30 Days Discharge Instructions: Cleanse the wound with Vashe prior to applying a clean dressing using gauze sponges, not tissue or cotton balls. Peri-Wound Care: Sween Lotion (Moisturizing lotion) 1 x Per Week/30 Days Discharge Instructions: Apply moisturizing lotion as directed Prim Dressing: Maxorb Extra Ag+ Alginate Dressing, 4x4.75 (in/in) 1 x Per Week/30 Days ary Discharge Instructions: Apply to wound bed as instructed Secondary Dressing: ABD Pad, 8x10 1 x Per Week/30 Days Discharge Instructions: Apply over primary dressing as directed. Secondary Dressing: Woven Gauze Sponge, Non-Sterile 4x4 in 1 x Per Week/30 Days Discharge Instructions: Apply over primary dressing as directed. Com pression Wrap: Urgo K2 Lite, (equivalent to a 3 layer) two layer compression system, regular 1 x Per Week/30 Days Discharge Instructions: Apply Urgo K2 Lite as directed (alternative to 3 layer compression). WOUND #7: - T Third Wound Laterality: Left oe Cleanser: Vashe 5.8 (oz) 1 x Per Day/30 Days Discharge Instructions: Cleanse the wound with Vashe prior to  applying a clean dressing using gauze sponges, not tissue or cotton balls. Cleanser: Wound Cleanser 1 x Per Day/30 Days Discharge Instructions: Cleanse the wound with wound cleanser prior to applying a clean dressing using gauze sponges, not tissue or cotton balls. Topical: Gentamicin 1 x Per Day/30 Days Discharge Instructions: As directed by physician Topical: Mupirocin Ointment 1 x Per Day/30 Days Discharge Instructions: Apply Mupirocin (Bactroban) as instructed Secondary Dressing: Woven Gauze Sponges 2x2 in 1 x Per Day/30 Days Discharge Instructions: Apply over primary dressing as directed. Secured With: Insurance underwriter, Sterile 2x75 (in/in)  1 x Per Day/30 Days Discharge Instructions: Secure with stretch gauze as directed. Secured With: 10M Medipore H Soft Cloth Surgical T ape, 4 x 10 (in/yd) 1 x Per Day/30 Days Discharge Instructions: Secure with tape as directed. 11/20/2022: The great toe ulcer on the right is stable. He has reaccumulated the callus over the left first metatarsal head, but there is still an opening underneath this. The left heel ulcer is stable. The left posterior calf wound has a layer of eschar over it, but underneath, the wound is down to just a little bit over a centimeter. The left fourth toe has also accumulated more callus; the wound is a pinpoint opening underneath this. He has a new wound on the left third toe, also underneath the callus. The right anterior tibial wound has some eschar on the surface, underneath which the wound is nearly healed. His x-ray was read and as expected, osteomyelitis is present. I used a curette to debride callus, slough, and eschar from all all of the wounds. I discussed the results of the x-ray with the patient. I told him options included a referral to infectious disease for long-term antibiotic therapy versus proceeding straight to amputation. He wants to think about this for a bit and says that he would need to see ID  and podiatry at the [place] to facilitate those. He is going to talk to his wife, his primary care doctor, and the [place] podiatrist about the findings. I did print out a copy of his x-ray report to take with him. For now, we are going to continue silver alginate to all of the sites with bilateral Urgo lite wraps. Follow-up in 1 week. Electronic Signature(s) Signed: 11/20/2022 11:56:35 AM By: Duanne Guess MD FACS Entered By: Duanne Guess on 11/20/2022 11:56:35 [name] ([id number]) [id number] Page 16 of 17 -------------------------------------------------------------------------------- HxROS Details Patient Name: Date of Service: [name] CE, RO GER W. 11/20/2022 10:30 A M Medical Record Number: [id number] Patient Account Number: [id number] Date of Birth/Sex: Treating RN: October 01, 1945 (77 y.o. M) Primary Care Provider: Sanjuan Dame Other Clinician: Referring Provider: Treating Provider/Extender: Floyce Stakes in Treatment: 2 Information Obtained From Patient Chart Respiratory Medical History: Past Medical History Notes: pulmonary HTN Cardiovascular Medical History: Positive for: Arrhythmia - A. fib; Hypertension; Vasculitis - Leukocytoclastic vasculitis (HCC) Past Medical History Notes: mitral regurgitation Endocrine Medical History: Positive for: Type II Diabetes - hgbA1c 6 Time with diabetes: Dx 77 years old Treated with: Diet Blood sugar tested every day: Yes Tested : once Immunizations Pneumococcal Vaccine: Received Pneumococcal Vaccination: Yes Received Pneumococcal Vaccination On or After 60th Birthday: Yes Implantable Devices No devices added Hospitalization / Surgery History Type of Hospitalization/Surgery cardioversion Family and Social History Diabetes: Yes - Mother,Siblings; Heart Disease: Yes - Father; Hypertension: Yes - Mother; Never smoker; Marital Status - Married; Alcohol Use: Never;  Drug Use: No History; Caffeine Use: Never; Financial Concerns: No; Food, Clothing or Shelter Needs: No; Support System Lacking: No; Transportation Concerns: No Electronic Signature(s) Signed: 11/20/2022 12:16:08 PM By: Duanne Guess MD FACS Entered By: Duanne Guess on 11/20/2022 11:51:01 -------------------------------------------------------------------------------- SuperBill Details Patient Name: Date of Service: [name] CE, RO GER W. 11/20/2022 Medical Record Number: [id number] Patient Account Number: [id number] Date of Birth/Sex: Treating RN: 1946-01-22 (77 y.o. 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.222 Non-pressure chronic ulcer of left calf with fat layer exposed L97.812 Non-pressure chronic ulcer of other part of right lower leg with fat layer exposed L97.522 Non-pressure chronic ulcer of other part of left foot with fat layer exposed E11.621 Type 2 diabetes mellitus with foot ulcer E11.622 Type 2 diabetes mellitus with other skin ulcer 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) Facility Procedures : CPT4 Code: [id number] Description: 08657 - DEBRIDE WOUND 1ST 20 SQ CM OR < ICD-10 Diagnosis Description 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 L97.422  Non-pressure chronic ulcer of left heel and midfoot with fat layer exposed L97.812 Non-pressure chronic ulcer of other part of right lower leg with fat layer expos Modifier:  ed Quantity: 1 Physician Procedures : CPT4 Code Description Modifier [id number] - WC PHYS LEVEL 4 - NEW PT 25 ICD-10 Diagnosis Description L97.512 Non-pressure chronic ulcer of other part of right foot with fat layer exposed L97.422 Non-pressure chronic ulcer of left heel and midfoot  with fat layer exposed L97.222 Non-pressure chronic ulcer of left calf with fat layer exposed L97.812 Non-pressure chronic ulcer of other part of right lower leg with fat layer exposed Quantity: 1 : [id number] - WC PHYS DEBR WO ANESTH 20 SQ CM ICD-10 Diagnosis Description 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 L97.422  Non-pressure chronic ulcer of left heel and midfoot with fat layer exposed L97.812 Non-pressure chronic ulcer of other part of right lower leg with fat layer exposed Quantity: 1 Electronic Signature(s) Signed: 11/20/2022 11:57:19 AM By: Duanne Guess MD FACS Entered By: Duanne Guess on 11/20/2022 11:57:19

## 2022-11-20 NOTE — Progress Notes (Addendum)
Abnormalities Noted: No No Abnormalities Noted: No Callus: No Atrophie Blanche: No Crepitus: No Cyanosis: No Excoriation: No Ecchymosis: No Induration: No Erythema: No Rash: No Hemosiderin Staining: Yes Scarring: No Mottled: No Pallor: No Moisture Rubor: No No Abnormalities Noted: No Dry / Scaly: No Temperature / Pain Maceration: No Temperature: No Abnormality KARLE, MASSE ([id number]) [id number] Page 14 of 19 Electronic Signature(s) Signed: 11/20/2022 12:28:55 PM By: Redmond Pulling RN, BSN Entered By: Redmond Pulling on 11/20/2022 07:51:53 -------------------------------------------------------------------------------- Wound Assessment Details Patient Name: Date of Service: Rosalio Macadamia CE, RO GER W. 11/20/2022 10:30 A M Medical Record Number: [id number] Patient Account Number: [id number] Date of Birth/Sex: Treating RN: 04-13-1945 (77 y.o. Cline Cools Primary Care Ardean Melroy: Sanjuan Dame Other Clinician: Referring Abednego Yeates: Treating Shakirah Kirkey/Extender: Floyce Stakes in Treatment: 2 Wound Status Wound Number: 3 Primary Etiology: Diabetic Wound/Ulcer of the Lower Extremity Wound Location: Right T Great oe Wound Status: Open Wounding Event: Gradually Appeared Comorbid History: Arrhythmia, Hypertension, Vasculitis, Type II Diabetes Date Acquired: 07/15/2022 Weeks Of Treatment: 2 Clustered Wound: No Photos Wound Measurements Length: (cm) 0.5 Width: (cm) 0.4 Depth: (cm) 1.4 Area: (cm) 0.157 Volume: (cm) 0.22 % Reduction in Area: 85.2% % Reduction in Volume:  82.7% Epithelialization: Small (1-33%) Tunneling: No Undermining: No Wound Description Classification: Grade 2 Wound Margin: Distinct, outline attached Exudate Amount: Medium Exudate Type: Serosanguineous Exudate Color: red, brown Foul Odor After Cleansing: No Slough/Fibrino Yes Wound Bed Granulation Amount: Large (67-100%) Exposed Structure Necrotic Amount: Small (1-33%) Fascia Exposed: No Fat Layer (Subcutaneous Tissue) Exposed: Yes Tendon Exposed: No Muscle Exposed: No Joint Exposed: No Bone Exposed: Yes Periwound Skin Texture Texture Color No Abnormalities Noted: No No Abnormalities Noted: No Callus: Yes Atrophie Blanche: No Crepitus: No Cyanosis: No Excoriation: No Ecchymosis: No Induration: No ErythemaNICHOLAD, ABRIGO ([id number]) [id number] Page 15 of 19 Rash: No Erythema Location: Circumferential, Red Streaks Scarring: No Hemosiderin Staining: No Mottled: No Moisture Pallor: No No Abnormalities Noted: Yes Rubor: No Temperature / Pain Temperature: No Abnormality Electronic Signature(s) Signed: 11/20/2022 12:28:55 PM By: Redmond Pulling RN, BSN Entered By: Redmond Pulling on 11/20/2022 08:16:00 -------------------------------------------------------------------------------- Wound Assessment Details Patient Name: Date of Service: Rosalio Macadamia CE, RO GER W. 11/20/2022 10:30 A M Medical Record Number: [id number] Patient Account Number: [id number] Date of Birth/Sex: Treating RN: 10/14/1945 (77 y.o. Cline Cools Primary Care Reason Helzer: Sanjuan Dame Other Clinician: Referring Erika Hussar: Treating Sarina Robleto/Extender: Floyce Stakes in Treatment: 2 Wound Status Wound Number: 4 Primary Etiology: Diabetic Wound/Ulcer of the Lower Extremity Wound Location: Left, Posterior Calcaneus Wound Status: Open Wounding Event: Gradually Appeared Comorbid History: Arrhythmia, Hypertension, Vasculitis, Type II  Diabetes Date Acquired: 08/18/2022 Weeks Of Treatment: 2 Clustered Wound: No Photos Wound Measurements Length: (cm) 0.5 Width: (cm) 0.6 Depth: (cm) 0.1 Area: (cm) 0.236 Volume: (cm) 0.024 % Reduction in Area: 76.9% % Reduction in Volume: 76.5% Epithelialization: Large (67-100%) Tunneling: No Undermining: No Wound Description Classification: Grade 1 Wound Margin: Distinct, outline attached Exudate Amount: Medium Exudate Type: Serosanguineous Exudate Color: red, brown Foul Odor After Cleansing: No Slough/Fibrino Yes Wound Bed Granulation Amount: Large (67-100%) Exposed Structure Granulation Quality: Red Fascia Exposed: No Necrotic Amount: Small (1-33%) Fat Layer (Subcutaneous Tissue) Exposed: Yes Necrotic Quality: Adherent Slough Tendon Exposed: No Muscle Exposed: No Joint Exposed: No Bone Exposed: No [name] ([id number]) [id number] Page 16 of 19 Periwound Skin Texture Texture Color No Abnormalities Noted: No No Abnormalities Noted: No Callus: No Atrophie Blanche: No  Therapy Performed for Wound Assessment: Wound #6 Left Metatarsal head first Performed By: Clinician Redmond Pulling, RN Compression Type: Three Layer Post Procedure Diagnosis Same as Pre-procedure Electronic Signature(s) Signed: 11/20/2022 12:28:55 PM By: Redmond Pulling RN, BSN [place], Reynaldo Minium ([id number]) By: Redmond Pulling RN, BSN [phone].pdf Page 3 of 19 Signed: 11/20/2022 12:28:55 PM Entered By: Redmond Pulling on 11/20/2022 08:57:14 -------------------------------------------------------------------------------- Encounter Discharge Information Details Patient Name: Date of Service: Rosalio Macadamia CE, RO GER W. 11/20/2022 10:30 A M Medical Record Number: [id number] Patient Account Number: [id number] Date of Birth/Sex: Treating RN: Aug 13, 1945 (77 y.o. Cline Cools Primary Care Dalten Ambrosino: Sanjuan Dame Other Clinician: Referring Genette Huertas: Treating Zamariyah Furukawa/Extender: Floyce Stakes in Treatment: 2 Encounter Discharge Information Items Post Procedure Vitals Discharge Condition: Stable Temperature (F): 98.1 Ambulatory Status: Ambulatory Pulse (bpm): 76 Discharge Destination: Home Respiratory Rate (breaths/min): 18 Transportation:  Private Auto Blood Pressure (mmHg): 129/69 Accompanied By: self Schedule Follow-up Appointment: Yes Clinical Summary of Care: Patient Declined Electronic Signature(s) Signed: 11/20/2022 12:28:55 PM By: Redmond Pulling RN, BSN Entered By: Redmond Pulling on 11/20/2022 08:19:03 -------------------------------------------------------------------------------- Lower Extremity Assessment Details Patient Name: Date of Service: Rosalio Macadamia CE, RO GER W. 11/20/2022 10:30 A M Medical Record Number: [id number] Patient Account Number: [id number] Date of Birth/Sex: Treating RN: [birth year] (77 y.o. Cline Cools Primary Care Emmersyn Kratzke: Sanjuan Dame Other Clinician: Referring Sameria Morss: Treating Treysen Sudbeck/Extender: Floyce Stakes in Treatment: 2 Edema Assessment Assessed: Kyra Searles: No] [Right: No] Edema: [Left: Yes] [Right: Yes] Calf Left: Right: Point of Measurement: 46 cm From Medial Instep 33.5 cm 34 cm Ankle Left: Right: Point of Measurement: 13 cm From Medial Instep 24 cm 25.5 cm Vascular Assessment Pulses: Dorsalis Pedis Palpable: [Left:Yes] [Right:Yes] Extremity colors, hair growth, and conditions: Extremity Color: [Left:Hyperpigmented] [Right:Hyperpigmented] Hair Growth on Extremity: [Left:No] [Right:No] Temperature of Extremity: [Left:Hot] [Right:Warm] Capillary Refill: [Left:< 3 seconds] [Right:< 3 seconds] Dependent Rubor: [Left:No] [Right:No] JAYDENN, MALINA ([id number]) [Left:Yes] [Right:[id number] Page 4 of 19 Yes] Electronic Signature(s) Signed: 11/20/2022 12:28:55 PM By: Redmond Pulling RN, BSN Entered By: Redmond Pulling on 11/20/2022 07:56:42 -------------------------------------------------------------------------------- Multi Wound Chart Details Patient Name: Date of Service: Rosalio Macadamia CE, RO GER W. 11/20/2022 10:30 A M Medical Record Number: [id number] Patient Account Number: [id number] Date of Birth/Sex: Treating  RN: [birth year] (77 y.o. M) Primary Care Toneisha Savary: Sanjuan Dame Other Clinician: Referring Malayna Noori: Treating Bettymae Yott/Extender: Floyce Stakes in Treatment: 2 Vital Signs Height(in): 73 Pulse(bpm): 76 Weight(lbs): 192 Blood Pressure(mmHg): 129/69 Body Mass Index(BMI): 25.3 Temperature(F): 98.1 Respiratory Rate(breaths/min): 18 [1:Photos:] Left, Posterior Lower Leg Right, Anterior Lower Leg Right T Great oe Wound Location: Gradually Appeared Trauma Gradually Appeared Wounding Event: Venous Leg Ulcer Skin Tear Diabetic Wound/Ulcer of the Lower Primary Etiology: Extremity Arrhythmia, Hypertension, Vasculitis, Arrhythmia, Hypertension, Vasculitis, Arrhythmia, Hypertension, Vasculitis, Comorbid History: Type II Diabetes Type II Diabetes Type II Diabetes 08/15/2022 11/02/2022 07/15/2022 Date Acquired: 2 2 2  Weeks of Treatment: Open Open Open Wound Status: No No No Wound Recurrence: Yes No No Clustered Wound: 2 N/A N/A Clustered Quantity: 3x1x0.1 1.3x1x0.1 0.5x0.4x1.4 Measurements L x W x D (cm) 2.356 1.021 0.157 A (cm) : rea 0.236 0.102 0.22 Volume (cm) : 40.50% -16.00% 85.20% % Reduction in A rea: 40.40% -15.90% 82.70% % Reduction in Volume: Full Thickness Without Exposed Partial Thickness Grade 2 Classification: Support Structures Medium Small Medium Exudate A mount: Serosanguineous Serosanguineous Serosanguineous Exudate Type: red, brown red, brown red, brown Exudate Color: Distinct, outline attached Distinct,  Education Details Patient Name: Date of Service: Rosalio Macadamia CE, RO GER W. 9/6/2024andnbsp10:30 A M Medical Record Number: [id number] Patient Account Number: [id number] Date of Birth/Gender: Treating RN: [birth year] (77 y.o. Cline Cools Primary Care Physician: Sanjuan Dame Other Clinician: Referring  Physician: Treating Physician/Extender: Floyce Stakes in Treatment: 2 Education Assessment Education Provided To: Patient Education Topics Provided Wound/Skin Impairment: Methods: Explain/Verbal Responses: State content correctly Electronic Signature(s) Signed: 11/20/2022 12:28:55 PM By: Redmond Pulling RN, BSN Entered By: Redmond Pulling on 11/20/2022 08:02:55 -------------------------------------------------------------------------------- Wound Assessment Details Patient Name: Date of Service: Rosalio Macadamia CE, RO GER W. 11/20/2022 10:30 A M Medical Record Number: [id number] Patient Account Number: [id number] Date of Birth/Sex: Treating RN: December 08, 1945 (77 y.o. Cline Cools Primary Care Chestine Belknap: Sanjuan Dame Other Clinician: Referring Tru Leopard: Treating Briaunna Grindstaff/Extender: Jameion, Verdugo, Reynaldo Minium ([id number]) [id number] Page 12 of 19 Weeks in Treatment: 2 Wound Status Wound Number: 1 Primary Etiology: Venous Leg Ulcer Wound Location: Left, Posterior Lower Leg Wound Status: Open Wounding Event: Gradually Appeared Comorbid History: Arrhythmia, Hypertension, Vasculitis, Type II Diabetes Date Acquired: 08/15/2022 Weeks Of Treatment: 2 Clustered Wound: Yes Photos Wound Measurements Length: (cm) Width: (cm) Depth: (cm) Clustered Quantity: Area: (cm) Volume: (cm) 3 % Reduction in Area: 40.5% 1 % Reduction in Volume: 40.4% 0.1 Epithelialization: Medium (34-66%) 2 Tunneling: No 2.356 Undermining: No 0.236 Wound Description Classification: Full Thickness Without Exposed Supp Wound Margin: Distinct, outline attached Exudate Amount: Medium Exudate Type: Serosanguineous Exudate Color: red, brown ort Structures Foul Odor After Cleansing: No Slough/Fibrino Yes Wound Bed Granulation Amount: None Present (0%) Exposed Structure Necrotic Amount: Large (67-100%) Fascia Exposed: No Necrotic  Quality: Eschar, Adherent Slough Fat Layer (Subcutaneous Tissue) Exposed: Yes Tendon Exposed: No Muscle Exposed: No Joint Exposed: No Bone Exposed: No Periwound Skin Texture Texture Color No Abnormalities Noted: No No Abnormalities Noted: No Callus: No Atrophie Blanche: No Crepitus: No Cyanosis: No Excoriation: No Ecchymosis: No Induration: No Erythema: No Rash: No Hemosiderin Staining: Yes Scarring: No Mottled: No Pallor: No Moisture Rubor: No No Abnormalities Noted: No Dry / Scaly: Yes Maceration: No Electronic Signature(s) Signed: 11/20/2022 12:28:55 PM By: Redmond Pulling RN, BSN Entered By: Redmond Pulling on 11/20/2022 07:50:44 [name] ([id number]) [id number] Page 13 of 19 -------------------------------------------------------------------------------- Wound Assessment Details Patient Name: Date of Service: Rosalio Macadamia CE, RO GER W. 11/20/2022 10:30 A M Medical Record Number: [id number] Patient Account Number: [id number] Date of Birth/Sex: Treating RN: 10-09-1945 (77 y.o. Cline Cools Primary Care Lola Czerwonka: Sanjuan Dame Other Clinician: Referring Edrian Melucci: Treating Dajia Gunnels/Extender: Floyce Stakes in Treatment: 2 Wound Status Wound Number: 2 Primary Etiology: Skin Tear Wound Location: Right, Anterior Lower Leg Wound Status: Open Wounding Event: Trauma Comorbid History: Arrhythmia, Hypertension, Vasculitis, Type II Diabetes Date Acquired: 11/02/2022 Weeks Of Treatment: 2 Clustered Wound: No Photos Wound Measurements Length: (cm) 1. Width: (cm) 1 Depth: (cm) 0. Area: (cm) 1 Volume: (cm) 0 3 % Reduction in Area: -16% % Reduction in Volume: -15.9% 1 Epithelialization: Small (1-33%) .021 Tunneling: No .102 Undermining: No Wound Description Classification: Partial Thickness Wound Margin: Distinct, outline attached Exudate Amount: Small Exudate Type: Serosanguineous Exudate Color: red,  brown Foul Odor After Cleansing: No Slough/Fibrino No Wound Bed Granulation Amount: None Present (0%) Exposed Structure Necrotic Amount: Large (67-100%) Fascia Exposed: No Fat Layer (Subcutaneous Tissue) Exposed: Yes Tendon Exposed: No Muscle Exposed: No Joint Exposed: No Bone Exposed: No Periwound Skin Texture Texture Color No  W ([id number]) [id number] Page 9 of 19 Gentamicin Discharge Instruction: As directed by physician Mupirocin Ointment Discharge Instruction: Apply Mupirocin (Bactroban) as instructed Primary Dressing Secondary Dressing Woven Gauze Sponges 2x2 in Discharge Instruction: Apply over primary dressing as directed. Secured With Conforming Stretch Gauze Bandage, Sterile 2x75 (in/in) Discharge Instruction: Secure with stretch gauze as directed. 74M Medipore H Soft Cloth Surgical T ape, 4 x 10 (in/yd) Discharge Instruction: Secure with tape as directed. Compression Wrap Compression Stockings Add-Ons Wound #6 (Metatarsal head first) Wound Laterality: Left Cleanser Soap and Water Discharge Instruction: May shower and wash wound with dial antibacterial soap and water prior to dressing change. Vashe 5.8 (oz) Discharge Instruction: Cleanse the wound with Vashe prior to applying a clean dressing using gauze sponges, not tissue or cotton balls. Peri-Wound Care Sween Lotion (Moisturizing lotion) Discharge Instruction: Apply  moisturizing lotion as directed Topical Primary Dressing Maxorb Extra Ag+ Alginate Dressing, 4x4.75 (in/in) Discharge Instruction: Apply to wound bed as instructed Secondary Dressing ABD Pad, 8x10 Discharge Instruction: Apply over primary dressing as directed. Woven Gauze Sponge, Non-Sterile 4x4 in Discharge Instruction: Apply over primary dressing as directed. Secured With Compression Wrap Urgo K2 Lite, (equivalent to a 3 layer) two layer compression system, regular Discharge Instruction: Apply Urgo K2 Lite as directed (alternative to 3 layer compression). Compression Stockings Add-Ons Wound #7 (Toe Third) Wound Laterality: Left Cleanser Vashe 5.8 (oz) Discharge Instruction: Cleanse the wound with Vashe prior to applying a clean dressing using gauze sponges, not tissue or cotton balls. Wound Cleanser Discharge Instruction: Cleanse the wound with wound cleanser prior to applying a clean dressing using gauze sponges, not tissue or cotton balls. Peri-Wound Care Topical Gentamicin Discharge Instruction: As directed by physician Mupirocin Ointment Discharge Instruction: Apply Mupirocin (Bactroban) as instructed Primary Dressing VIRGIN, MAKOWSKI ([id number]) [id number] Page 10 of 19 Secondary Dressing Woven Gauze Sponges 2x2 in Discharge Instruction: Apply over primary dressing as directed. Secured With Conforming Stretch Gauze Bandage, Sterile 2x75 (in/in) Discharge Instruction: Secure with stretch gauze as directed. 74M Medipore H Soft Cloth Surgical T ape, 4 x 10 (in/yd) Discharge Instruction: Secure with tape as directed. Compression Wrap Compression Stockings Add-Ons Electronic Signature(s) Signed: 11/20/2022 11:41:48 AM By: Duanne Guess MD FACS Entered By: Duanne Guess on 11/20/2022 08:41:48 -------------------------------------------------------------------------------- Multi-Disciplinary Care Plan Details Patient Name: Date of  Service: Rosalio Macadamia CE, RO GER W. 11/20/2022 10:30 A M Medical Record Number: [id number] Patient Account Number: [id number] Date of Birth/Sex: Treating RN: [birth year] (77 y.o. Cline Cools Primary Care Sissy Goetzke: Sanjuan Dame Other Clinician: Referring Tanessa Tidd: Treating Leanna Hamid/Extender: Floyce Stakes in Treatment: 2 Active Inactive Electronic Signature(s) Signed: 12/07/2022 3:12:34 PM By: Shawn Stall RN, BSN Signed: 01/11/2023 4:04:53 PM By: Redmond Pulling RN, BSN Previous Signature: 11/20/2022 12:28:55 PM Version By: Redmond Pulling RN, BSN Entered By: Shawn Stall on 12/07/2022 12:12:33 -------------------------------------------------------------------------------- Pain Assessment Details Patient Name: Date of Service: Rosalio Macadamia CE, RO GER W. 11/20/2022 10:30 A M Medical Record Number: [id number] Patient Account Number: [id number] Date of Birth/Sex: Treating RN: 08-08-1945 (77 y.o. Cline Cools Primary Care Dequita Schleicher: Sanjuan Dame Other Clinician: Referring Tayvien Kane: Treating Khush Pasion/Extender: Floyce Stakes in Treatment: 2 Active Problems Location of Pain Severity and Description of Pain Patient Has Paino No Site Locations [place], [address] ([id number]) [id number] Page 11 of 19 Pain Management and Medication Current Pain Management: Electronic Signature(s) Signed: 11/20/2022 12:28:55 PM By: Redmond Pulling RN, BSN Entered By: Redmond Pulling on 11/20/2022 07:44:31 -------------------------------------------------------------------------------- Patient/Caregiver  Education Details Patient Name: Date of Service: Rosalio Macadamia CE, RO GER W. 9/6/2024andnbsp10:30 A M Medical Record Number: [id number] Patient Account Number: [id number] Date of Birth/Gender: Treating RN: [birth year] (77 y.o. Cline Cools Primary Care Physician: Sanjuan Dame Other Clinician: Referring  Physician: Treating Physician/Extender: Floyce Stakes in Treatment: 2 Education Assessment Education Provided To: Patient Education Topics Provided Wound/Skin Impairment: Methods: Explain/Verbal Responses: State content correctly Electronic Signature(s) Signed: 11/20/2022 12:28:55 PM By: Redmond Pulling RN, BSN Entered By: Redmond Pulling on 11/20/2022 08:02:55 -------------------------------------------------------------------------------- Wound Assessment Details Patient Name: Date of Service: Rosalio Macadamia CE, RO GER W. 11/20/2022 10:30 A M Medical Record Number: [id number] Patient Account Number: [id number] Date of Birth/Sex: Treating RN: December 08, 1945 (77 y.o. Cline Cools Primary Care Chestine Belknap: Sanjuan Dame Other Clinician: Referring Tru Leopard: Treating Briaunna Grindstaff/Extender: Jameion, Verdugo, Reynaldo Minium ([id number]) [id number] Page 12 of 19 Weeks in Treatment: 2 Wound Status Wound Number: 1 Primary Etiology: Venous Leg Ulcer Wound Location: Left, Posterior Lower Leg Wound Status: Open Wounding Event: Gradually Appeared Comorbid History: Arrhythmia, Hypertension, Vasculitis, Type II Diabetes Date Acquired: 08/15/2022 Weeks Of Treatment: 2 Clustered Wound: Yes Photos Wound Measurements Length: (cm) Width: (cm) Depth: (cm) Clustered Quantity: Area: (cm) Volume: (cm) 3 % Reduction in Area: 40.5% 1 % Reduction in Volume: 40.4% 0.1 Epithelialization: Medium (34-66%) 2 Tunneling: No 2.356 Undermining: No 0.236 Wound Description Classification: Full Thickness Without Exposed Supp Wound Margin: Distinct, outline attached Exudate Amount: Medium Exudate Type: Serosanguineous Exudate Color: red, brown ort Structures Foul Odor After Cleansing: No Slough/Fibrino Yes Wound Bed Granulation Amount: None Present (0%) Exposed Structure Necrotic Amount: Large (67-100%) Fascia Exposed: No Necrotic  Quality: Eschar, Adherent Slough Fat Layer (Subcutaneous Tissue) Exposed: Yes Tendon Exposed: No Muscle Exposed: No Joint Exposed: No Bone Exposed: No Periwound Skin Texture Texture Color No Abnormalities Noted: No No Abnormalities Noted: No Callus: No Atrophie Blanche: No Crepitus: No Cyanosis: No Excoriation: No Ecchymosis: No Induration: No Erythema: No Rash: No Hemosiderin Staining: Yes Scarring: No Mottled: No Pallor: No Moisture Rubor: No No Abnormalities Noted: No Dry / Scaly: Yes Maceration: No Electronic Signature(s) Signed: 11/20/2022 12:28:55 PM By: Redmond Pulling RN, BSN Entered By: Redmond Pulling on 11/20/2022 07:50:44 [name] ([id number]) [id number] Page 13 of 19 -------------------------------------------------------------------------------- Wound Assessment Details Patient Name: Date of Service: Rosalio Macadamia CE, RO GER W. 11/20/2022 10:30 A M Medical Record Number: [id number] Patient Account Number: [id number] Date of Birth/Sex: Treating RN: 10-09-1945 (77 y.o. Cline Cools Primary Care Lola Czerwonka: Sanjuan Dame Other Clinician: Referring Edrian Melucci: Treating Dajia Gunnels/Extender: Floyce Stakes in Treatment: 2 Wound Status Wound Number: 2 Primary Etiology: Skin Tear Wound Location: Right, Anterior Lower Leg Wound Status: Open Wounding Event: Trauma Comorbid History: Arrhythmia, Hypertension, Vasculitis, Type II Diabetes Date Acquired: 11/02/2022 Weeks Of Treatment: 2 Clustered Wound: No Photos Wound Measurements Length: (cm) 1. Width: (cm) 1 Depth: (cm) 0. Area: (cm) 1 Volume: (cm) 0 3 % Reduction in Area: -16% % Reduction in Volume: -15.9% 1 Epithelialization: Small (1-33%) .021 Tunneling: No .102 Undermining: No Wound Description Classification: Partial Thickness Wound Margin: Distinct, outline attached Exudate Amount: Small Exudate Type: Serosanguineous Exudate Color: red,  brown Foul Odor After Cleansing: No Slough/Fibrino No Wound Bed Granulation Amount: None Present (0%) Exposed Structure Necrotic Amount: Large (67-100%) Fascia Exposed: No Fat Layer (Subcutaneous Tissue) Exposed: Yes Tendon Exposed: No Muscle Exposed: No Joint Exposed: No Bone Exposed: No Periwound Skin Texture Texture Color No  V ELA CE, RO GER W. 11/20/2022 10:30 A M Medical Record Number: [id number] Patient Account Number: [id number] Date of Birth/Sex: Treating RN: [birth year] (77 y.o. Cline Cools Primary Care  Suhayla Chisom: Sanjuan Dame Other Clinician: Referring Norma Montemurro: Treating Jerad Dunlap/Extender: Floyce Stakes in Treatment: 2 Wound Status Wound Number: 7 Primary Etiology: Pressure Ulcer Wound Location: Left T Third oe Wound Status: Open Wounding Event: Gradually Appeared Comorbid History: Arrhythmia, Hypertension, Vasculitis, Type II Diabetes Date Acquired: 11/20/2022 Weeks Of Treatment: 0 Clustered Wound: No Wound Measurements Length: (cm) 0.2 Width: (cm) 0.2 Depth: (cm) 0.1 Area: (cm) 0.031 Volume: (cm) 0.003 % Reduction in Area: % Reduction in Volume: Epithelialization: None Tunneling: No Undermining: No Wound Description Classification: Category/Stage II Exudate Amount: Medium Exudate Type: Serosanguineous Exudate Color: red, brown Foul Odor After Cleansing: No Slough/Fibrino Yes Wound Bed Granulation Amount: None Present (0%) Exposed Structure Necrotic Amount: Large (67-100%) Fascia Exposed: No Necrotic Quality: Eschar Fat Layer (Subcutaneous Tissue) Exposed: Yes Tendon Exposed: No Muscle Exposed: No Joint Exposed: No Bone Exposed: No [address] [place], [place] ([id number]) [id number] Page 19 of 19 Texture Color No Abnormalities Noted: No No Abnormalities Noted: No Callus: Yes Moisture No Abnormalities Noted: No Electronic Signature(s) Signed: 11/20/2022 12:28:55 PM By: Redmond Pulling RN, BSN Entered By: Redmond Pulling on 11/20/2022 08:15:45 -------------------------------------------------------------------------------- Vitals Details Patient Name: Date of Service: Rosalio Macadamia CE, RO GER W. 11/20/2022 10:30 A M Medical Record Number: [id number] Patient Account Number: [id number] Date of Birth/Sex: Treating RN: Nov 19, 1945 (77 y.o. Cline Cools Primary Care Francisca Langenderfer: Sanjuan Dame Other Clinician: Referring Azelie Noguera: Treating Blayde Bacigalupi/Extender: Floyce Stakes in Treatment: 2 Vital Signs Time Taken: 10:44 Temperature (F): 98.1 Height (in): 73 Pulse (bpm): 76 Weight (lbs): 192 Respiratory Rate (breaths/min): 18 Body Mass Index (BMI): 25.3 Blood Pressure (mmHg): 129/69 Reference Range: 80 - 120 mg / dl Electronic Signature(s) Signed: 11/20/2022 12:28:55 PM By: Redmond Pulling RN, BSN Entered By: Redmond Pulling on 11/20/2022 07:44:24  W ([id number]) [id number] Page 9 of 19 Gentamicin Discharge Instruction: As directed by physician Mupirocin Ointment Discharge Instruction: Apply Mupirocin (Bactroban) as instructed Primary Dressing Secondary Dressing Woven Gauze Sponges 2x2 in Discharge Instruction: Apply over primary dressing as directed. Secured With Conforming Stretch Gauze Bandage, Sterile 2x75 (in/in) Discharge Instruction: Secure with stretch gauze as directed. 74M Medipore H Soft Cloth Surgical T ape, 4 x 10 (in/yd) Discharge Instruction: Secure with tape as directed. Compression Wrap Compression Stockings Add-Ons Wound #6 (Metatarsal head first) Wound Laterality: Left Cleanser Soap and Water Discharge Instruction: May shower and wash wound with dial antibacterial soap and water prior to dressing change. Vashe 5.8 (oz) Discharge Instruction: Cleanse the wound with Vashe prior to applying a clean dressing using gauze sponges, not tissue or cotton balls. Peri-Wound Care Sween Lotion (Moisturizing lotion) Discharge Instruction: Apply  moisturizing lotion as directed Topical Primary Dressing Maxorb Extra Ag+ Alginate Dressing, 4x4.75 (in/in) Discharge Instruction: Apply to wound bed as instructed Secondary Dressing ABD Pad, 8x10 Discharge Instruction: Apply over primary dressing as directed. Woven Gauze Sponge, Non-Sterile 4x4 in Discharge Instruction: Apply over primary dressing as directed. Secured With Compression Wrap Urgo K2 Lite, (equivalent to a 3 layer) two layer compression system, regular Discharge Instruction: Apply Urgo K2 Lite as directed (alternative to 3 layer compression). Compression Stockings Add-Ons Wound #7 (Toe Third) Wound Laterality: Left Cleanser Vashe 5.8 (oz) Discharge Instruction: Cleanse the wound with Vashe prior to applying a clean dressing using gauze sponges, not tissue or cotton balls. Wound Cleanser Discharge Instruction: Cleanse the wound with wound cleanser prior to applying a clean dressing using gauze sponges, not tissue or cotton balls. Peri-Wound Care Topical Gentamicin Discharge Instruction: As directed by physician Mupirocin Ointment Discharge Instruction: Apply Mupirocin (Bactroban) as instructed Primary Dressing VIRGIN, MAKOWSKI ([id number]) [id number] Page 10 of 19 Secondary Dressing Woven Gauze Sponges 2x2 in Discharge Instruction: Apply over primary dressing as directed. Secured With Conforming Stretch Gauze Bandage, Sterile 2x75 (in/in) Discharge Instruction: Secure with stretch gauze as directed. 74M Medipore H Soft Cloth Surgical T ape, 4 x 10 (in/yd) Discharge Instruction: Secure with tape as directed. Compression Wrap Compression Stockings Add-Ons Electronic Signature(s) Signed: 11/20/2022 11:41:48 AM By: Duanne Guess MD FACS Entered By: Duanne Guess on 11/20/2022 08:41:48 -------------------------------------------------------------------------------- Multi-Disciplinary Care Plan Details Patient Name: Date of  Service: Rosalio Macadamia CE, RO GER W. 11/20/2022 10:30 A M Medical Record Number: [id number] Patient Account Number: [id number] Date of Birth/Sex: Treating RN: [birth year] (77 y.o. Cline Cools Primary Care Sissy Goetzke: Sanjuan Dame Other Clinician: Referring Tanessa Tidd: Treating Leanna Hamid/Extender: Floyce Stakes in Treatment: 2 Active Inactive Electronic Signature(s) Signed: 12/07/2022 3:12:34 PM By: Shawn Stall RN, BSN Signed: 01/11/2023 4:04:53 PM By: Redmond Pulling RN, BSN Previous Signature: 11/20/2022 12:28:55 PM Version By: Redmond Pulling RN, BSN Entered By: Shawn Stall on 12/07/2022 12:12:33 -------------------------------------------------------------------------------- Pain Assessment Details Patient Name: Date of Service: Rosalio Macadamia CE, RO GER W. 11/20/2022 10:30 A M Medical Record Number: [id number] Patient Account Number: [id number] Date of Birth/Sex: Treating RN: 08-08-1945 (77 y.o. Cline Cools Primary Care Dequita Schleicher: Sanjuan Dame Other Clinician: Referring Tayvien Kane: Treating Khush Pasion/Extender: Floyce Stakes in Treatment: 2 Active Problems Location of Pain Severity and Description of Pain Patient Has Paino No Site Locations [place], [address] ([id number]) [id number] Page 11 of 19 Pain Management and Medication Current Pain Management: Electronic Signature(s) Signed: 11/20/2022 12:28:55 PM By: Redmond Pulling RN, BSN Entered By: Redmond Pulling on 11/20/2022 07:44:31 -------------------------------------------------------------------------------- Patient/Caregiver  Crepitus: No Cyanosis: No Excoriation: No Ecchymosis: No Induration: No Erythema: No Rash: No Hemosiderin Staining: No Scarring: No Mottled: No Pallor: No Moisture Rubor: No No Abnormalities Noted: No Dry / Scaly: No Maceration: No Electronic Signature(s) Signed: 11/20/2022 12:28:55 PM By: Redmond Pulling RN, BSN Entered By: Redmond Pulling on 11/20/2022 07:53:55 -------------------------------------------------------------------------------- Wound Assessment Details Patient Name: Date of Service: Rosalio Macadamia CE, RO GER W. 11/20/2022 10:30 A M Medical Record Number: [id number] Patient Account Number: [id number] Date of Birth/Sex: Treating RN: 1945/11/10 (77 y.o. Cline Cools Primary Care Zaakirah Kistner: Sanjuan Dame Other Clinician: Referring Jahzara Slattery: Treating Kaya Pottenger/Extender: Floyce Stakes in Treatment: 2 Wound Status Wound  Number: 5 Primary Etiology: Diabetic Wound/Ulcer of the Lower Extremity Wound Location: Left T Fourth oe Wound Status: Open Wounding Event: Pressure Injury Comorbid History: Arrhythmia, Hypertension, Vasculitis, Type II Diabetes Date Acquired: 11/06/2022 Weeks Of Treatment: 2 Clustered Wound: No Photos Wound Measurements Length: (cm) 0.1 Width: (cm) 0.1 Depth: (cm) 0.1 Area: (cm) 0.008 Volume: (cm) 0.001 % Reduction in Area: 95.9% % Reduction in Volume: 95% Epithelialization: Large (67-100%) Tunneling: No Undermining: No Wound Description Classification: Grade 1 Wound Margin: Distinct, outline attached Exudate Amount: None Present Foul Odor After Cleansing: No Slough/Fibrino No Wound Bed Granulation Amount: None Present (0%) Exposed Structure Necrotic Amount: Large (67-100%) Fascia Exposed: No THADDUS, GIKAS ([id number]) [id number] Page 17 of 19 Necrotic Quality: Eschar Fat Layer (Subcutaneous Tissue) Exposed: Yes Tendon Exposed: No Muscle Exposed: No Joint Exposed: No Bone Exposed: No Periwound Skin Texture Texture Color No Abnormalities Noted: No No Abnormalities Noted: No Callus: Yes Atrophie Blanche: No Crepitus: No Cyanosis: No Excoriation: No Ecchymosis: No Induration: No Erythema: No Rash: No Hemosiderin Staining: No Scarring: No Mottled: No Pallor: No Moisture Rubor: No No Abnormalities Noted: No Dry / Scaly: No Maceration: No Electronic Signature(s) Signed: 11/20/2022 12:28:55 PM By: Redmond Pulling RN, BSN Entered By: Redmond Pulling on 11/20/2022 07:54:51 -------------------------------------------------------------------------------- Wound Assessment Details Patient Name: Date of Service: Rosalio Macadamia CE, RO GER W. 11/20/2022 10:30 A M Medical Record Number: [id number] Patient Account Number: [id number] Date of Birth/Sex: Treating RN: 1945-05-29 (77 y.o. Cline Cools Primary Care Tuere Nwosu: Sanjuan Dame Other  Clinician: Referring Aleph Nickson: Treating Tabitha Tupper/Extender: Floyce Stakes in Treatment: 2 Wound Status Wound Number: 6 Primary Etiology: Diabetic Wound/Ulcer of the Lower Extremity Wound Location: Left Metatarsal head first Wound Status: Open Wounding Event: Gradually Appeared Comorbid History: Arrhythmia, Hypertension, Vasculitis, Type II Diabetes Date Acquired: 11/13/2022 Weeks Of Treatment: 1 Clustered Wound: No Photos Wound Measurements Length: (cm) 0.1 Width: (cm) 0.1 Depth: (cm) 0.1 Area: (cm) 0.008 Volume: (cm) 0.001 % Reduction in Area: 89.9% % Reduction in Volume: 87.5% Epithelialization: Large (67-100%) Tunneling: No Undermining: No Wound Description Classification: Grade 1 Wound Margin: Distinct, outline attached DONATHAN, TOMER ([id number]) Exudate Amount: Medium Exudate Type: Serosanguineous Exudate Color: red, brown Foul Odor After Cleansing: No Slough/Fibrino No [id number] Page 18 of 19 Wound Bed Granulation Amount: None Present (0%) Exposed Structure Necrotic Amount: Large (67-100%) Fascia Exposed: No Necrotic Quality: Eschar Fat Layer (Subcutaneous Tissue) Exposed: Yes Tendon Exposed: No Muscle Exposed: No Joint Exposed: No Bone Exposed: No Periwound Skin Texture Texture Color No Abnormalities Noted: No No Abnormalities Noted: Yes Callus: Yes Temperature / Pain Temperature: No Abnormality Moisture No Abnormalities Noted: Yes Electronic Signature(s) Signed: 11/20/2022 12:28:55 PM By: Redmond Pulling RN, BSN Entered By: Redmond Pulling on 11/20/2022 07:55:44 -------------------------------------------------------------------------------- Wound Assessment Details Patient Name: Date of Service: LO  V ELA CE, RO GER W. 11/20/2022 10:30 A M Medical Record Number: [id number] Patient Account Number: [id number] Date of Birth/Sex: Treating RN: [birth year] (77 y.o. Cline Cools Primary Care  Suhayla Chisom: Sanjuan Dame Other Clinician: Referring Norma Montemurro: Treating Jerad Dunlap/Extender: Floyce Stakes in Treatment: 2 Wound Status Wound Number: 7 Primary Etiology: Pressure Ulcer Wound Location: Left T Third oe Wound Status: Open Wounding Event: Gradually Appeared Comorbid History: Arrhythmia, Hypertension, Vasculitis, Type II Diabetes Date Acquired: 11/20/2022 Weeks Of Treatment: 0 Clustered Wound: No Wound Measurements Length: (cm) 0.2 Width: (cm) 0.2 Depth: (cm) 0.1 Area: (cm) 0.031 Volume: (cm) 0.003 % Reduction in Area: % Reduction in Volume: Epithelialization: None Tunneling: No Undermining: No Wound Description Classification: Category/Stage II Exudate Amount: Medium Exudate Type: Serosanguineous Exudate Color: red, brown Foul Odor After Cleansing: No Slough/Fibrino Yes Wound Bed Granulation Amount: None Present (0%) Exposed Structure Necrotic Amount: Large (67-100%) Fascia Exposed: No Necrotic Quality: Eschar Fat Layer (Subcutaneous Tissue) Exposed: Yes Tendon Exposed: No Muscle Exposed: No Joint Exposed: No Bone Exposed: No [address] YONEO, MAGPANTAY ([id number]) [id number] Page 19 of 19 Texture Color No Abnormalities Noted: No No Abnormalities Noted: No Callus: Yes Moisture No Abnormalities Noted: No Electronic Signature(s) Signed: 11/20/2022 12:28:55 PM By: Redmond Pulling RN, BSN Entered By: Redmond Pulling on 11/20/2022 08:15:45 -------------------------------------------------------------------------------- Vitals Details Patient Name: Date of Service: Rosalio Macadamia CE, RO GER W. 11/20/2022 10:30 A M Medical Record Number: [id number] Patient Account Number: [id number] Date of Birth/Sex: Treating RN: Nov 19, 1945 (77 y.o. Cline Cools Primary Care Francisca Langenderfer: Sanjuan Dame Other Clinician: Referring Azelie Noguera: Treating Blayde Bacigalupi/Extender: Floyce Stakes in Treatment: 2 Vital Signs Time Taken: 10:44 Temperature (F): 98.1 Height (in): 73 Pulse (bpm): 76 Weight (lbs): 192 Respiratory Rate (breaths/min): 18 Body Mass Index (BMI): 25.3 Blood Pressure (mmHg): 129/69 Reference Range: 80 - 120 mg / dl Electronic Signature(s) Signed: 11/20/2022 12:28:55 PM By: Redmond Pulling RN, BSN Entered By: Redmond Pulling on 11/20/2022 07:44:24  JONATTAN, BECKHAM ([id number]) [id number] Page 1 of 19 Visit Report for 11/20/2022 Arrival Information Details Patient Name: Date of Service: Rosalio Macadamia CE, RO GER W. 11/20/2022 10:30 A M Medical Record Number: [id number] Patient Account Number: [id number] Date of Birth/Sex: Treating RN: 12-13-1945 (77 y.o. Cline Cools Primary Care Aris Even: Sanjuan Dame Other Clinician: Referring Mace Weinberg: Treating Delbert Vu/Extender: Floyce Stakes in Treatment: 2 Visit Information History Since Last Visit Added or deleted any medications: No Patient Arrived: Ambulatory Any new allergies or adverse reactions: No Arrival Time: 10:25 Had a fall or experienced change in No Accompanied By: self activities of daily living that may affect Transfer Assistance: Manual risk of falls: Patient Identification Verified: Yes Signs or symptoms of abuse/neglect since last visito No Secondary Verification Process Completed: Yes Hospitalized since last visit: No Patient Requires Transmission-Based Precautions: No Implantable device outside of the clinic excluding No Patient Has Alerts: Yes cellular tissue based products placed in the center Patient Alerts: Patient on Blood Thinner since last visit: Has Dressing in Place as Prescribed: Yes Has Compression in Place as Prescribed: Yes Pain Present Now: Yes Electronic Signature(s) Signed: 11/20/2022 12:28:55 PM By: Redmond Pulling RN, BSN Entered By: Redmond Pulling on 11/20/2022 07:43:59 -------------------------------------------------------------------------------- Compression Therapy Details Patient Name: Date of Service: Rosalio Macadamia CE, RO GER W. 11/20/2022 10:30 A M Medical Record Number: [id number] Patient Account Number: [id number] Date of Birth/Sex: Treating RN: November 01, 1945 (77 y.o. Cline Cools Primary Care Milburn Freeney: Sanjuan Dame Other Clinician: Referring Gillermo Poch: Treating Ramir Malerba/Extender:  Floyce Stakes in Treatment: 2 Compression Therapy Performed for Wound Assessment: Wound #1 Left,Posterior Lower Leg Performed By: Clinician Redmond Pulling, RN Compression Type: Three Layer Post Procedure Diagnosis Same as Pre-procedure Electronic Signature(s) Signed: 11/20/2022 12:28:55 PM By: Redmond Pulling RN, BSN Entered By: Redmond Pulling on 11/20/2022 08:57:13 [name] ([id number]) [id number] Page 2 of 19 -------------------------------------------------------------------------------- Compression Therapy Details Patient Name: Date of Service: Rosalio Macadamia CE, RO GER W. 11/20/2022 10:30 A M Medical Record Number: [id number] Patient Account Number: [id number] Date of Birth/Sex: Treating RN: 03/24/1945 (77 y.o. Cline Cools Primary Care Zenaya Ulatowski: Sanjuan Dame Other Clinician: Referring Mitali Shenefield: Treating Daylyn Azbill/Extender: Floyce Stakes in Treatment: 2 Compression Therapy Performed for Wound Assessment: Wound #2 Right,Anterior Lower Leg Performed By: Clinician Redmond Pulling, RN Compression Type: Three Layer Post Procedure Diagnosis Same as Pre-procedure Electronic Signature(s) Signed: 11/20/2022 12:28:55 PM By: Redmond Pulling RN, BSN Entered By: Redmond Pulling on 11/20/2022 08:57:13 -------------------------------------------------------------------------------- Compression Therapy Details Patient Name: Date of Service: Rosalio Macadamia CE, RO GER W. 11/20/2022 10:30 A M Medical Record Number: [id number] Patient Account Number: [id number] Date of Birth/Sex: Treating RN: [birth year] (77 y.o. Cline Cools Primary Care Tamiyah Moulin: Sanjuan Dame Other Clinician: Referring Aseel Uhde: Treating Keirstan Iannello/Extender: Floyce Stakes in Treatment: 2 Compression Therapy Performed for Wound Assessment: Wound #4 Left,Posterior Calcaneus Performed By: Clinician Redmond Pulling,  RN Compression Type: Three Layer Post Procedure Diagnosis Same as Pre-procedure Electronic Signature(s) Signed: 11/20/2022 12:28:55 PM By: Redmond Pulling RN, BSN Entered By: Redmond Pulling on 11/20/2022 08:57:13 -------------------------------------------------------------------------------- Compression Therapy Details Patient Name: Date of Service: Rosalio Macadamia CE, RO GER W. 11/20/2022 10:30 A M Medical Record Number: [id number] Patient Account Number: [id number] Date of Birth/Sex: Treating RN: 1945/11/04 (77 y.o. Cline Cools Primary Care Valmore Arabie: Sanjuan Dame Other Clinician: Referring Kaven Cumbie: Treating Juno Bozard/Extender: Floyce Stakes in Treatment: 2 Compression

## 2022-11-27 ENCOUNTER — Ambulatory Visit (HOSPITAL_BASED_OUTPATIENT_CLINIC_OR_DEPARTMENT_OTHER): Payer: Medicare Other | Admitting: General Surgery

## 2022-12-07 DIAGNOSIS — T380X5A Adverse effect of glucocorticoids and synthetic analogues, initial encounter: Secondary | ICD-10-CM | POA: Diagnosis present

## 2022-12-07 DIAGNOSIS — Z85820 Personal history of malignant melanoma of skin: Secondary | ICD-10-CM | POA: Diagnosis not present

## 2022-12-07 DIAGNOSIS — M79661 Pain in right lower leg: Secondary | ICD-10-CM | POA: Diagnosis not present

## 2022-12-07 DIAGNOSIS — N183 Chronic kidney disease, stage 3 unspecified: Secondary | ICD-10-CM | POA: Diagnosis present

## 2022-12-07 DIAGNOSIS — E1151 Type 2 diabetes mellitus with diabetic peripheral angiopathy without gangrene: Secondary | ICD-10-CM | POA: Diagnosis present

## 2022-12-07 DIAGNOSIS — D688 Other specified coagulation defects: Secondary | ICD-10-CM | POA: Diagnosis present

## 2022-12-07 DIAGNOSIS — D689 Coagulation defect, unspecified: Secondary | ICD-10-CM | POA: Insufficient documentation

## 2022-12-07 DIAGNOSIS — M96841 Postprocedural hematoma of a musculoskeletal structure following other procedure: Secondary | ICD-10-CM | POA: Diagnosis not present

## 2022-12-07 DIAGNOSIS — Z89411 Acquired absence of right great toe: Secondary | ICD-10-CM | POA: Diagnosis not present

## 2022-12-07 DIAGNOSIS — E1165 Type 2 diabetes mellitus with hyperglycemia: Secondary | ICD-10-CM | POA: Diagnosis not present

## 2022-12-07 DIAGNOSIS — I724 Aneurysm of artery of lower extremity: Secondary | ICD-10-CM | POA: Diagnosis present

## 2022-12-07 DIAGNOSIS — Z66 Do not resuscitate: Secondary | ICD-10-CM | POA: Diagnosis present

## 2022-12-07 DIAGNOSIS — Z89421 Acquired absence of other right toe(s): Secondary | ICD-10-CM | POA: Diagnosis not present

## 2022-12-07 DIAGNOSIS — Z48812 Encounter for surgical aftercare following surgery on the circulatory system: Secondary | ICD-10-CM | POA: Diagnosis not present

## 2022-12-07 DIAGNOSIS — I729 Aneurysm of unspecified site: Secondary | ICD-10-CM | POA: Diagnosis not present

## 2022-12-07 DIAGNOSIS — I872 Venous insufficiency (chronic) (peripheral): Secondary | ICD-10-CM | POA: Diagnosis not present

## 2022-12-07 DIAGNOSIS — Z7901 Long term (current) use of anticoagulants: Secondary | ICD-10-CM | POA: Diagnosis not present

## 2022-12-07 DIAGNOSIS — S7011XA Contusion of right thigh, initial encounter: Secondary | ICD-10-CM | POA: Diagnosis present

## 2022-12-07 DIAGNOSIS — R5381 Other malaise: Secondary | ICD-10-CM | POA: Diagnosis not present

## 2022-12-07 DIAGNOSIS — Z85828 Personal history of other malignant neoplasm of skin: Secondary | ICD-10-CM | POA: Diagnosis not present

## 2022-12-07 DIAGNOSIS — L7632 Postprocedural hematoma of skin and subcutaneous tissue following other procedure: Secondary | ICD-10-CM | POA: Diagnosis present

## 2022-12-07 DIAGNOSIS — I129 Hypertensive chronic kidney disease with stage 1 through stage 4 chronic kidney disease, or unspecified chronic kidney disease: Secondary | ICD-10-CM | POA: Diagnosis present

## 2022-12-07 DIAGNOSIS — N400 Enlarged prostate without lower urinary tract symptoms: Secondary | ICD-10-CM | POA: Diagnosis present

## 2022-12-07 DIAGNOSIS — Z4781 Encounter for orthopedic aftercare following surgical amputation: Secondary | ICD-10-CM | POA: Diagnosis not present

## 2022-12-07 DIAGNOSIS — M86171 Other acute osteomyelitis, right ankle and foot: Secondary | ICD-10-CM | POA: Diagnosis not present

## 2022-12-07 DIAGNOSIS — I4821 Permanent atrial fibrillation: Secondary | ICD-10-CM | POA: Diagnosis present

## 2022-12-07 DIAGNOSIS — D649 Anemia, unspecified: Secondary | ICD-10-CM | POA: Diagnosis present

## 2022-12-07 DIAGNOSIS — Z79899 Other long term (current) drug therapy: Secondary | ICD-10-CM | POA: Diagnosis not present

## 2022-12-07 DIAGNOSIS — Z9862 Peripheral vascular angioplasty status: Secondary | ICD-10-CM | POA: Diagnosis not present

## 2022-12-07 DIAGNOSIS — I4891 Unspecified atrial fibrillation: Secondary | ICD-10-CM | POA: Diagnosis not present

## 2022-12-07 DIAGNOSIS — I499 Cardiac arrhythmia, unspecified: Secondary | ICD-10-CM | POA: Diagnosis not present

## 2022-12-07 DIAGNOSIS — E1122 Type 2 diabetes mellitus with diabetic chronic kidney disease: Secondary | ICD-10-CM | POA: Diagnosis present

## 2022-12-11 LAB — HEMOGLOBIN A1C: Hemoglobin A1C: 6.9

## 2022-12-16 ENCOUNTER — Other Ambulatory Visit (HOSPITAL_BASED_OUTPATIENT_CLINIC_OR_DEPARTMENT_OTHER): Payer: Self-pay | Admitting: Family Medicine

## 2022-12-17 DIAGNOSIS — I724 Aneurysm of artery of lower extremity: Secondary | ICD-10-CM | POA: Diagnosis not present

## 2022-12-17 DIAGNOSIS — N183 Chronic kidney disease, stage 3 unspecified: Secondary | ICD-10-CM | POA: Diagnosis not present

## 2022-12-17 DIAGNOSIS — Z48812 Encounter for surgical aftercare following surgery on the circulatory system: Secondary | ICD-10-CM | POA: Diagnosis not present

## 2022-12-17 DIAGNOSIS — M96841 Postprocedural hematoma of a musculoskeletal structure following other procedure: Secondary | ICD-10-CM | POA: Diagnosis not present

## 2022-12-17 DIAGNOSIS — R2689 Other abnormalities of gait and mobility: Secondary | ICD-10-CM | POA: Diagnosis not present

## 2022-12-17 DIAGNOSIS — Z4781 Encounter for orthopedic aftercare following surgical amputation: Secondary | ICD-10-CM | POA: Diagnosis not present

## 2022-12-17 DIAGNOSIS — R5381 Other malaise: Secondary | ICD-10-CM | POA: Diagnosis present

## 2022-12-17 DIAGNOSIS — M109 Gout, unspecified: Secondary | ICD-10-CM | POA: Diagnosis not present

## 2022-12-17 DIAGNOSIS — R410 Disorientation, unspecified: Secondary | ICD-10-CM | POA: Diagnosis not present

## 2022-12-17 DIAGNOSIS — M86171 Other acute osteomyelitis, right ankle and foot: Secondary | ICD-10-CM | POA: Diagnosis not present

## 2022-12-17 DIAGNOSIS — E1169 Type 2 diabetes mellitus with other specified complication: Secondary | ICD-10-CM | POA: Diagnosis not present

## 2022-12-17 DIAGNOSIS — I729 Aneurysm of unspecified site: Secondary | ICD-10-CM | POA: Diagnosis not present

## 2022-12-17 DIAGNOSIS — Z9889 Other specified postprocedural states: Secondary | ICD-10-CM | POA: Diagnosis not present

## 2022-12-17 DIAGNOSIS — Z736 Limitation of activities due to disability: Secondary | ICD-10-CM | POA: Diagnosis not present

## 2022-12-17 DIAGNOSIS — I272 Pulmonary hypertension, unspecified: Secondary | ICD-10-CM | POA: Diagnosis not present

## 2022-12-17 DIAGNOSIS — N189 Chronic kidney disease, unspecified: Secondary | ICD-10-CM | POA: Diagnosis not present

## 2022-12-17 DIAGNOSIS — I872 Venous insufficiency (chronic) (peripheral): Secondary | ICD-10-CM | POA: Diagnosis not present

## 2022-12-17 DIAGNOSIS — E1151 Type 2 diabetes mellitus with diabetic peripheral angiopathy without gangrene: Secondary | ICD-10-CM | POA: Diagnosis not present

## 2022-12-17 DIAGNOSIS — Z89411 Acquired absence of right great toe: Secondary | ICD-10-CM | POA: Diagnosis not present

## 2022-12-17 DIAGNOSIS — B9561 Methicillin susceptible Staphylococcus aureus infection as the cause of diseases classified elsewhere: Secondary | ICD-10-CM | POA: Diagnosis not present

## 2022-12-17 DIAGNOSIS — D689 Coagulation defect, unspecified: Secondary | ICD-10-CM | POA: Diagnosis not present

## 2022-12-17 DIAGNOSIS — M868X7 Other osteomyelitis, ankle and foot: Secondary | ICD-10-CM | POA: Diagnosis not present

## 2022-12-17 DIAGNOSIS — Z9862 Peripheral vascular angioplasty status: Secondary | ICD-10-CM | POA: Diagnosis not present

## 2022-12-17 DIAGNOSIS — E1122 Type 2 diabetes mellitus with diabetic chronic kidney disease: Secondary | ICD-10-CM | POA: Diagnosis not present

## 2022-12-17 DIAGNOSIS — Z792 Long term (current) use of antibiotics: Secondary | ICD-10-CM | POA: Diagnosis not present

## 2022-12-17 DIAGNOSIS — I129 Hypertensive chronic kidney disease with stage 1 through stage 4 chronic kidney disease, or unspecified chronic kidney disease: Secondary | ICD-10-CM | POA: Diagnosis not present

## 2022-12-17 DIAGNOSIS — N179 Acute kidney failure, unspecified: Secondary | ICD-10-CM | POA: Diagnosis not present

## 2022-12-17 DIAGNOSIS — I4821 Permanent atrial fibrillation: Secondary | ICD-10-CM | POA: Diagnosis not present

## 2022-12-18 DIAGNOSIS — R5381 Other malaise: Secondary | ICD-10-CM | POA: Diagnosis not present

## 2022-12-20 ENCOUNTER — Other Ambulatory Visit (HOSPITAL_BASED_OUTPATIENT_CLINIC_OR_DEPARTMENT_OTHER): Payer: Self-pay | Admitting: Family Medicine

## 2022-12-20 DIAGNOSIS — E785 Hyperlipidemia, unspecified: Secondary | ICD-10-CM

## 2022-12-21 DIAGNOSIS — R5381 Other malaise: Secondary | ICD-10-CM | POA: Diagnosis not present

## 2022-12-22 DIAGNOSIS — B9561 Methicillin susceptible Staphylococcus aureus infection as the cause of diseases classified elsewhere: Secondary | ICD-10-CM | POA: Diagnosis not present

## 2022-12-22 DIAGNOSIS — M868X7 Other osteomyelitis, ankle and foot: Secondary | ICD-10-CM | POA: Diagnosis not present

## 2022-12-22 DIAGNOSIS — R5381 Other malaise: Secondary | ICD-10-CM | POA: Diagnosis not present

## 2022-12-22 DIAGNOSIS — N189 Chronic kidney disease, unspecified: Secondary | ICD-10-CM | POA: Diagnosis not present

## 2022-12-23 DIAGNOSIS — R5381 Other malaise: Secondary | ICD-10-CM | POA: Diagnosis not present

## 2022-12-23 DIAGNOSIS — S98111A Complete traumatic amputation of right great toe, initial encounter: Secondary | ICD-10-CM | POA: Insufficient documentation

## 2022-12-23 DIAGNOSIS — I739 Peripheral vascular disease, unspecified: Secondary | ICD-10-CM | POA: Insufficient documentation

## 2022-12-25 DIAGNOSIS — R5381 Other malaise: Secondary | ICD-10-CM | POA: Diagnosis not present

## 2022-12-30 ENCOUNTER — Ambulatory Visit: Payer: Medicare Other | Admitting: Cardiology

## 2023-01-02 DIAGNOSIS — Z7901 Long term (current) use of anticoagulants: Secondary | ICD-10-CM | POA: Diagnosis not present

## 2023-01-02 DIAGNOSIS — Z85820 Personal history of malignant melanoma of skin: Secondary | ICD-10-CM | POA: Diagnosis not present

## 2023-01-02 DIAGNOSIS — Z85828 Personal history of other malignant neoplasm of skin: Secondary | ICD-10-CM | POA: Diagnosis not present

## 2023-01-02 DIAGNOSIS — Z9181 History of falling: Secondary | ICD-10-CM | POA: Diagnosis not present

## 2023-01-02 DIAGNOSIS — E1169 Type 2 diabetes mellitus with other specified complication: Secondary | ICD-10-CM | POA: Diagnosis not present

## 2023-01-02 DIAGNOSIS — E1122 Type 2 diabetes mellitus with diabetic chronic kidney disease: Secondary | ICD-10-CM | POA: Diagnosis not present

## 2023-01-02 DIAGNOSIS — I4891 Unspecified atrial fibrillation: Secondary | ICD-10-CM | POA: Diagnosis not present

## 2023-01-02 DIAGNOSIS — I872 Venous insufficiency (chronic) (peripheral): Secondary | ICD-10-CM | POA: Diagnosis not present

## 2023-01-02 DIAGNOSIS — N183 Chronic kidney disease, stage 3 unspecified: Secondary | ICD-10-CM | POA: Diagnosis not present

## 2023-01-02 DIAGNOSIS — I959 Hypotension, unspecified: Secondary | ICD-10-CM | POA: Diagnosis not present

## 2023-01-02 DIAGNOSIS — M869 Osteomyelitis, unspecified: Secondary | ICD-10-CM | POA: Diagnosis not present

## 2023-01-02 DIAGNOSIS — E1151 Type 2 diabetes mellitus with diabetic peripheral angiopathy without gangrene: Secondary | ICD-10-CM | POA: Diagnosis not present

## 2023-01-02 DIAGNOSIS — M109 Gout, unspecified: Secondary | ICD-10-CM | POA: Diagnosis not present

## 2023-01-02 DIAGNOSIS — Z89411 Acquired absence of right great toe: Secondary | ICD-10-CM | POA: Diagnosis not present

## 2023-01-02 DIAGNOSIS — I97638 Postprocedural hematoma of a circulatory system organ or structure following other circulatory system procedure: Secondary | ICD-10-CM | POA: Diagnosis not present

## 2023-01-04 DIAGNOSIS — N183 Chronic kidney disease, stage 3 unspecified: Secondary | ICD-10-CM | POA: Diagnosis not present

## 2023-01-04 DIAGNOSIS — E1122 Type 2 diabetes mellitus with diabetic chronic kidney disease: Secondary | ICD-10-CM | POA: Diagnosis not present

## 2023-01-04 DIAGNOSIS — M869 Osteomyelitis, unspecified: Secondary | ICD-10-CM | POA: Diagnosis not present

## 2023-01-04 DIAGNOSIS — E1169 Type 2 diabetes mellitus with other specified complication: Secondary | ICD-10-CM | POA: Diagnosis not present

## 2023-01-04 DIAGNOSIS — I97638 Postprocedural hematoma of a circulatory system organ or structure following other circulatory system procedure: Secondary | ICD-10-CM | POA: Diagnosis not present

## 2023-01-04 DIAGNOSIS — E1151 Type 2 diabetes mellitus with diabetic peripheral angiopathy without gangrene: Secondary | ICD-10-CM | POA: Diagnosis not present

## 2023-01-06 DIAGNOSIS — E1151 Type 2 diabetes mellitus with diabetic peripheral angiopathy without gangrene: Secondary | ICD-10-CM | POA: Diagnosis not present

## 2023-01-06 DIAGNOSIS — E1122 Type 2 diabetes mellitus with diabetic chronic kidney disease: Secondary | ICD-10-CM | POA: Diagnosis not present

## 2023-01-06 DIAGNOSIS — E1169 Type 2 diabetes mellitus with other specified complication: Secondary | ICD-10-CM | POA: Diagnosis not present

## 2023-01-06 DIAGNOSIS — I97638 Postprocedural hematoma of a circulatory system organ or structure following other circulatory system procedure: Secondary | ICD-10-CM | POA: Diagnosis not present

## 2023-01-06 DIAGNOSIS — N183 Chronic kidney disease, stage 3 unspecified: Secondary | ICD-10-CM | POA: Diagnosis not present

## 2023-01-06 DIAGNOSIS — M869 Osteomyelitis, unspecified: Secondary | ICD-10-CM | POA: Diagnosis not present

## 2023-01-08 DIAGNOSIS — E1151 Type 2 diabetes mellitus with diabetic peripheral angiopathy without gangrene: Secondary | ICD-10-CM | POA: Diagnosis not present

## 2023-01-08 DIAGNOSIS — N183 Chronic kidney disease, stage 3 unspecified: Secondary | ICD-10-CM | POA: Diagnosis not present

## 2023-01-08 DIAGNOSIS — E1169 Type 2 diabetes mellitus with other specified complication: Secondary | ICD-10-CM | POA: Diagnosis not present

## 2023-01-08 DIAGNOSIS — M869 Osteomyelitis, unspecified: Secondary | ICD-10-CM | POA: Diagnosis not present

## 2023-01-08 DIAGNOSIS — I97638 Postprocedural hematoma of a circulatory system organ or structure following other circulatory system procedure: Secondary | ICD-10-CM | POA: Diagnosis not present

## 2023-01-08 DIAGNOSIS — E1122 Type 2 diabetes mellitus with diabetic chronic kidney disease: Secondary | ICD-10-CM | POA: Diagnosis not present

## 2023-01-11 DIAGNOSIS — E1122 Type 2 diabetes mellitus with diabetic chronic kidney disease: Secondary | ICD-10-CM | POA: Diagnosis not present

## 2023-01-11 DIAGNOSIS — I97638 Postprocedural hematoma of a circulatory system organ or structure following other circulatory system procedure: Secondary | ICD-10-CM | POA: Diagnosis not present

## 2023-01-11 DIAGNOSIS — E1151 Type 2 diabetes mellitus with diabetic peripheral angiopathy without gangrene: Secondary | ICD-10-CM | POA: Diagnosis not present

## 2023-01-11 DIAGNOSIS — M869 Osteomyelitis, unspecified: Secondary | ICD-10-CM | POA: Diagnosis not present

## 2023-01-11 DIAGNOSIS — E1169 Type 2 diabetes mellitus with other specified complication: Secondary | ICD-10-CM | POA: Diagnosis not present

## 2023-01-11 DIAGNOSIS — N183 Chronic kidney disease, stage 3 unspecified: Secondary | ICD-10-CM | POA: Diagnosis not present

## 2023-01-13 DIAGNOSIS — I97638 Postprocedural hematoma of a circulatory system organ or structure following other circulatory system procedure: Secondary | ICD-10-CM | POA: Diagnosis not present

## 2023-01-13 DIAGNOSIS — M869 Osteomyelitis, unspecified: Secondary | ICD-10-CM | POA: Diagnosis not present

## 2023-01-13 DIAGNOSIS — E1169 Type 2 diabetes mellitus with other specified complication: Secondary | ICD-10-CM | POA: Diagnosis not present

## 2023-01-13 DIAGNOSIS — E1122 Type 2 diabetes mellitus with diabetic chronic kidney disease: Secondary | ICD-10-CM | POA: Diagnosis not present

## 2023-01-13 DIAGNOSIS — E1151 Type 2 diabetes mellitus with diabetic peripheral angiopathy without gangrene: Secondary | ICD-10-CM | POA: Diagnosis not present

## 2023-01-13 DIAGNOSIS — N183 Chronic kidney disease, stage 3 unspecified: Secondary | ICD-10-CM | POA: Diagnosis not present

## 2023-01-14 DIAGNOSIS — I729 Aneurysm of unspecified site: Secondary | ICD-10-CM | POA: Diagnosis not present

## 2023-01-15 ENCOUNTER — Other Ambulatory Visit (HOSPITAL_BASED_OUTPATIENT_CLINIC_OR_DEPARTMENT_OTHER): Payer: Self-pay | Admitting: Family Medicine

## 2023-01-15 DIAGNOSIS — E1169 Type 2 diabetes mellitus with other specified complication: Secondary | ICD-10-CM | POA: Diagnosis not present

## 2023-01-15 DIAGNOSIS — I97638 Postprocedural hematoma of a circulatory system organ or structure following other circulatory system procedure: Secondary | ICD-10-CM | POA: Diagnosis not present

## 2023-01-15 DIAGNOSIS — E1122 Type 2 diabetes mellitus with diabetic chronic kidney disease: Secondary | ICD-10-CM | POA: Diagnosis not present

## 2023-01-15 DIAGNOSIS — E1151 Type 2 diabetes mellitus with diabetic peripheral angiopathy without gangrene: Secondary | ICD-10-CM | POA: Diagnosis not present

## 2023-01-15 DIAGNOSIS — N183 Chronic kidney disease, stage 3 unspecified: Secondary | ICD-10-CM | POA: Diagnosis not present

## 2023-01-15 DIAGNOSIS — M869 Osteomyelitis, unspecified: Secondary | ICD-10-CM | POA: Diagnosis not present

## 2023-01-18 ENCOUNTER — Ambulatory Visit (HOSPITAL_BASED_OUTPATIENT_CLINIC_OR_DEPARTMENT_OTHER): Payer: Medicare Other | Admitting: Family Medicine

## 2023-01-19 ENCOUNTER — Other Ambulatory Visit (HOSPITAL_BASED_OUTPATIENT_CLINIC_OR_DEPARTMENT_OTHER): Payer: Self-pay | Admitting: Family Medicine

## 2023-01-25 ENCOUNTER — Encounter (HOSPITAL_BASED_OUTPATIENT_CLINIC_OR_DEPARTMENT_OTHER): Payer: Self-pay | Admitting: Family Medicine

## 2023-01-25 ENCOUNTER — Ambulatory Visit (HOSPITAL_BASED_OUTPATIENT_CLINIC_OR_DEPARTMENT_OTHER): Payer: Medicare Other | Admitting: Family Medicine

## 2023-01-25 VITALS — BP 118/84 | HR 88 | Ht 75.0 in | Wt 200.8 lb

## 2023-01-25 DIAGNOSIS — M86271 Subacute osteomyelitis, right ankle and foot: Secondary | ICD-10-CM | POA: Insufficient documentation

## 2023-01-25 DIAGNOSIS — N1832 Chronic kidney disease, stage 3b: Secondary | ICD-10-CM

## 2023-01-25 DIAGNOSIS — Z4889 Encounter for other specified surgical aftercare: Secondary | ICD-10-CM | POA: Insufficient documentation

## 2023-01-25 DIAGNOSIS — N400 Enlarged prostate without lower urinary tract symptoms: Secondary | ICD-10-CM

## 2023-01-25 DIAGNOSIS — I10 Essential (primary) hypertension: Secondary | ICD-10-CM | POA: Diagnosis not present

## 2023-01-25 DIAGNOSIS — Z01818 Encounter for other preprocedural examination: Secondary | ICD-10-CM | POA: Insufficient documentation

## 2023-01-25 DIAGNOSIS — S91109D Unspecified open wound of unspecified toe(s) without damage to nail, subsequent encounter: Secondary | ICD-10-CM | POA: Insufficient documentation

## 2023-01-25 DIAGNOSIS — I878 Other specified disorders of veins: Secondary | ICD-10-CM | POA: Insufficient documentation

## 2023-01-25 DIAGNOSIS — E1122 Type 2 diabetes mellitus with diabetic chronic kidney disease: Secondary | ICD-10-CM | POA: Diagnosis not present

## 2023-01-25 DIAGNOSIS — M869 Osteomyelitis, unspecified: Secondary | ICD-10-CM | POA: Insufficient documentation

## 2023-01-25 DIAGNOSIS — R809 Proteinuria, unspecified: Secondary | ICD-10-CM | POA: Insufficient documentation

## 2023-01-25 DIAGNOSIS — R6889 Other general symptoms and signs: Secondary | ICD-10-CM | POA: Insufficient documentation

## 2023-01-25 DIAGNOSIS — K5730 Diverticulosis of large intestine without perforation or abscess without bleeding: Secondary | ICD-10-CM | POA: Insufficient documentation

## 2023-01-25 DIAGNOSIS — Z658 Other specified problems related to psychosocial circumstances: Secondary | ICD-10-CM | POA: Insufficient documentation

## 2023-01-25 DIAGNOSIS — C439 Malignant melanoma of skin, unspecified: Secondary | ICD-10-CM | POA: Insufficient documentation

## 2023-01-25 DIAGNOSIS — M5412 Radiculopathy, cervical region: Secondary | ICD-10-CM | POA: Diagnosis not present

## 2023-01-25 MED ORDER — TADALAFIL 5 MG PO TABS: 5.0000 mg | ORAL_TABLET | Freq: Every day | ORAL | 1 refills | Status: DC

## 2023-01-25 NOTE — Progress Notes (Unsigned)
    Procedures performed today:    None.  Independent interpretation of notes and tests performed by another provider:   None.  Brief History, Exam, Impression, and Recommendations:    BP 118/84 (BP Location: Right Arm, Patient Position: Sitting, Cuff Size: Normal)   Pulse 88   Ht 6\' 3"  (1.905 m)   Wt 200 lb 12.8 oz (91.1 kg)   SpO2 97%   BMI 25.10 kg/m   Benign prostatic hyperplasia, unspecified whether lower urinary tract symptoms present Assessment & Plan: Has been taking tadalafil chronically for this.  He is requesting refill of tadalafil today.  Reviewed medication, potential risk, benefits, adverse effects from medication.  Can proceed with refill at this time.  We will continue to monitor progress with this medication moving forward   Primary hypertension Assessment & Plan: Blood pressure appropriate in office today.  Can continue with current medication regimen, no changes needed to medications today. Recommend intermittent monitoring of blood pressure at home, DASH diet   Type 2 diabetes mellitus with stage 3b chronic kidney disease, without long-term current use of insulin (HCC) Assessment & Plan: Most recent hemoglobin A1c was checked about 6 weeks ago, was at goal at 6.9% at that time.  Patient continues with glipizide, denies any concerns today. He is due for nephropathy screening, does not think that he can provide urine sample today.  Order has been placed for urine testing, patient can return to office at any time to have this completed Can continue with current medication regimen, no changes necessary today  Orders: -     Microalbumin / creatinine urine ratio; Future  Other orders -     Tadalafil; Take 1 tablet (5 mg total) by mouth daily.  Dispense: 30 tablet; Refill: 1  Since last appointment with me, patient has had recent interventions including hematoma drainage, toe amputation.  Following these procedures, patient does note that he is doing much  better and does not have any specific concerns related to either of these prior operative interventions.  Return in about 3 months (around 04/27/2023) for diabetes.  Spent 36 minutes on this patient encounter, including preparation, chart review, face-to-face counseling with patient and coordination of care, and documentation of encounter   ___________________________________________ Ia Leeb de [place], MD, ABFM, [facility]

## 2023-01-26 ENCOUNTER — Other Ambulatory Visit (HOSPITAL_BASED_OUTPATIENT_CLINIC_OR_DEPARTMENT_OTHER): Payer: Self-pay | Admitting: *Deleted

## 2023-01-26 MED ORDER — PANTOPRAZOLE SODIUM 40 MG PO TBEC: 40.0000 mg | DELAYED_RELEASE_TABLET | Freq: Every day | ORAL | 3 refills | Status: DC

## 2023-01-26 MED ORDER — METOPROLOL TARTRATE 25 MG PO TABS: 12.5000 mg | ORAL_TABLET | Freq: Two times a day (BID) | ORAL | 3 refills | Status: DC

## 2023-01-27 NOTE — Assessment & Plan Note (Signed)
Has been taking tadalafil chronically for this.  He is requesting refill of tadalafil today.  Reviewed medication, potential risk, benefits, adverse effects from medication.  Can proceed with refill at this time.  We will continue to monitor progress with this medication moving forward

## 2023-01-27 NOTE — Assessment & Plan Note (Signed)
Most recent hemoglobin A1c was checked about 6 weeks ago, was at goal at 6.9% at that time.  Patient continues with glipizide, denies any concerns today. He is due for nephropathy screening, does not think that he can provide urine sample today.  Order has been placed for urine testing, patient can return to office at any time to have this completed Can continue with current medication regimen, no changes necessary today

## 2023-01-27 NOTE — Assessment & Plan Note (Signed)
Blood pressure appropriate in office today.  Can continue with current medication regimen, no changes needed to medications today. Recommend intermittent monitoring of blood pressure at home, DASH diet

## 2023-02-01 DIAGNOSIS — N1832 Chronic kidney disease, stage 3b: Secondary | ICD-10-CM | POA: Diagnosis not present

## 2023-02-01 NOTE — Progress Notes (Unsigned)
[address] [place], [place]  [address] Phone: [phone] Fax:  [phone]  Date:  02/02/2023   ID:  Troy, Norris 1945/09/08, MRN [id number]  PCP:  de Peru, Raymond J, MD  Cardiologist:  Dr. Estephania Licciardi Norris     History of Present Illness: [name] is a 77 y.o. male seen for follow up Afib. He has a  hx of permanent AFib, mod MR, chronic pulmonary HTN, HTN, HL, T2DM.  Echo (09/2011):  EF 55%, mod MR, severe LAE, severe RAE, PASP 57-61, trivial eff. Apparently had Afib dating back to the 1970s while in the service in [place].   He underwent lap cholecystectomy in 2016. In 2018 he was admitted with hyperkalemia and AKI that responded to hydration.   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 He was treated with IV antibiotics. He had been given vitamin K and advised to hold his Coumadin with plans to start Eliquis once INR trended down to 2.  He had been discharged home on Keflex.   He was readmitted to our hospital on 12/18-12/25/23 with syncope and upper GI bleed. He had black stools.  He was orthostatic and had AKI, hyperkalemia.GI team was consulted to assist with patient's management. Patient underwent EGD on 05/04/2022. EGD revealed polyps of the stomach and a nonbleeding duodenal ulcer. He was transfused 4 units of PRBCs. GI planned relook in 6-8 weeks. He was hydrated with return of renal function to baseline. He was DC on Eliquis. Echo during his stay (study done with high HR in AFib) showed normal EF. Moderate RV enlargement, moderate pulmonary HTN, severe MR and severe biatrial enlargement.  Patient's anticoagulation was restarted. He was on midodrine and Florinef for orthostasis.   On follow up with PCP on March 4 Hgb improved to 9.1 and renal function was back to baseline. He is still weak. Getting home PT.   Still getting wraps on LE at wound center. Notes he has lost 40 lbs since beginning of year with poor appetite and medical illnesses.  He was seen on April 25 by Eligha Bridegroom NP. Noted increase in LE swelling. Legs still being wrapped at [facility]. Florinef discontinued and continued on lasix 20 mg daily.   On follow up today he reports he has received care at the [facility] as well. Was dealing with a chronic wound on his leg. 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 Had a peripheral angiogram without significant obstruction. Did develop a femoral PSA post procedure. Had a wound vac on for a period of time. States his wife manages his medication so not really sure what he is taking. Denies any dyspnea or chest pain. Swelling is doing well. Has lost 11 lbs since June.   Recent Labs: 08/24/2022: ALT 20; Creatinine, Ser 1.42; Hemoglobin 9.8; Potassium 4.5; TSH 1.990  Wt Readings from Last 3 Encounters:  02/02/23 199 lb (90.3 kg)  01/25/23 200 lb 12.8 oz (91.1 kg)  10/30/22 192 lb (87.1 kg)     Past Medical History:  Diagnosis Date   Atrial fibrillation (HCC)    Chronic anticoagulation    Complication of anesthesia    ' hard time waking up" per patient    Diabetes mellitus    type 2   GERD (gastroesophageal reflux disease)    Hyperlipidemia  Hypertension    Mitral regurgitation    a. 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HCC)    chronic atrial fib   Pulmonary hypertension (HCC)    Pulmonary hypertension (HCC)     Current Outpatient Medications  Medication Sig Dispense Refill   acetaminophen (TYLENOL) 500 MG tablet Take 1,000 mg by mouth as needed for mild pain.     allopurinol (ZYLOPRIM) 100 MG tablet TAKE 1 TABLET BY MOUTH 2 TIMES A DAY 120 tablet 0   apixaban (ELIQUIS) 5 MG TABS tablet Take 1 tablet (5 mg total) by mouth 2 (two) times daily. 60 tablet 0    feeding supplement (ENSURE ENLIVE / ENSURE PLUS) LIQD Take 237 mLs by mouth 2 (two) times daily between meals. 237 mL 12   furosemide (LASIX) 20 MG tablet Take 20 mg by mouth daily as needed for edema.     glipiZIDE (GLUCOTROL XL) 2.5 MG 24 hr tablet Take 1 tablet (2.5 mg total) by mouth daily with breakfast. 90 tablet 1   Glucose Blood (FREESTYLE LITE TEST VI)      Iron, Ferrous Sulfate, 325 (65 Fe) MG TABS Take 325 mg by mouth daily. 30 tablet 2   metoprolol tartrate (LOPRESSOR) 25 MG tablet Take 0.5 tablets (12.5 mg total) by mouth 2 (two) times daily. 90 tablet 3   midodrine (PROAMATINE) 10 MG tablet Take 1 tablet (10 mg total) by mouth daily. 90 tablet 3   NASADROPS SALINE ON THE GO 0.9 % SOLN Apply topically.     pantoprazole (PROTONIX) 40 MG tablet Take 1 tablet (40 mg total) by mouth daily. 90 tablet 3   simvastatin (ZOCOR) 20 MG tablet TAKE 1 TABLET BY MOUTH AT BEDTIME 90 tablet 0   tadalafil (CIALIS) 5 MG tablet Take 1 tablet (5 mg total) by mouth daily. 30 tablet 1   tamsulosin (FLOMAX) 0.4 MG CAPS capsule TAKE 1 CAPSULE BY MOUTH DAILY 30 capsule 0   Wound Cleansers (VASHE CLEANSING) SOLN Apply topically.     No current facility-administered medications for this visit.    Allergies:   Atorvastatin and Lisinopril   Social History:  The patient  reports that he has never smoked. He has never been exposed to tobacco smoke. He has never used smokeless tobacco. He reports that he does not currently use alcohol. He reports that he does not use drugs.   Family History:  The patient's family history includes Alzheimer's disease in his mother; Diabetes in his brother and mother; Heart disease in his father; Hyperlipidemia in his brother; Hypertension in his mother.   ROS:  Please see the history of present illness.      All other systems reviewed and negative.   PHYSICAL EXAM: VS:  BP 112/60 (BP Location: Left Arm, Patient Position: Sitting, Cuff Size: Normal)   Pulse 78   Ht 6\' 3"   (1.905 m)   Wt 199 lb (90.3 kg)   SpO2 99%   BMI 24.87 kg/m  GENERAL:  Well appearing WM in NAD HEENT:  PERRL, EOMI, sclera are clear. Oropharynx is clear. NECK:  No jugular venous distention, carotid upstroke brisk and symmetric, no bruits, no thyromegaly or adenopathy Back is kyphotic.  HEART:  IRRR,  PMI not displaced or sustained,S1 and S2 within normal limits, no S3, no S4: no clicks, no rubs, 2/6 systolic systolic murmur at the apex ABD:  Soft, nontender. BS +, no masses or  bruits. No hepatomegaly, no splenomegaly EXT:  eft leg wrapped.  Right leg with compression hose. SKIN:  Warm and dry.  No rashes NEURO:  Alert and oriented x 3. Cranial nerves II through XII intact. PSYCH:  Cognitively intact    Laboratory data: Lab Results  Component Value Date   WBC 7.2 08/24/2022   HGB 9.8 (L) 08/24/2022   HCT 31.8 (L) 08/24/2022   PLT 295 08/24/2022   GLUCOSE 123 (H) 08/24/2022   ALT 20 08/24/2022   AST 36 08/24/2022   NA 144 08/24/2022   K 4.5 08/24/2022   CL 105 08/24/2022   CREATININE 1.42 (H) 08/24/2022   BUN 31 (H) 08/24/2022   CO2 24 08/24/2022   TSH 1.990 08/24/2022   INR 1.4 (H) 05/05/2022   HGBA1C 6.9 12/11/2022   MICROALBUR 150 01/23/2021   Dated 10/19/17: cholesterol 145, triglycerides 125, HDL 38, LDL 82. A1c 7.5%. Hgb 13.1. creatinine 1.71. other chemistries and TSH normal.  Dated 08/03/22: glucose 118, BUN 26, creatinine 1.57. otherwise chemistries and magnesium normal. Hgb 9,3,  Dated 12/25/22: BUN 35, creatinine 1.47. CRP 72. Electrolytes normal. Hgb 10.1  EKG:  not done Today  Echo 12/24/14: Study Conclusions   - Left ventricle: The cavity size was mildly dilated. Wall   thickness was normal. Systolic function was normal. The estimated   ejection fraction was in the range of 60% to 65%. - Mitral valve: There was moderate regurgitation. - Left atrium: The atrium was severely dilated. - Right ventricle: The cavity size was moderately dilated. Wall    thickness was normal. - Right atrium: The atrium was moderately dilated  Echo 05/31/20: IMPRESSIONS     1. Left ventricular ejection fraction, by estimation, is 60 to 65%. The  left ventricle has normal function. The left ventricle has no regional  wall motion abnormalities. There is mild left ventricular hypertrophy.  Left ventricular diastolic function  could not be evaluated.   2. Right ventricular systolic function is normal. The right ventricular  size is normal. There is severely elevated pulmonary artery systolic  pressure. The estimated right ventricular systolic pressure is 66.0 mmHg.   3. Left atrial size was severely dilated.   4. Right atrial size was moderately dilated.   5. The mitral valve is abnormal. Moderate to severe mitral valve  regurgitation.   6. Eccentric TR jet. The tricuspid valve is abnormal. Tricuspid valve  regurgitation is moderate.   7. The aortic valve is tricuspid. Aortic valve regurgitation is trivial.   8. The inferior vena cava is dilated in size with <50% respiratory  variability, suggesting right atrial pressure of 15 mmHg.   Comparison(s): Changes from prior study are noted. 05/31/19 EF 60-65%.  Moderate-severe MR, RVSP 50 mmHg.   Echo 05/05/22: IMPRESSIONS     1. Left ventricular ejection fraction, by estimation, is 70 to 75%. The  left ventricle has hyperdynamic function. The left ventricle has no  regional wall motion abnormalities. Left ventricular diastolic parameters  are indeterminate.   2. Right ventricular systolic function is normal. The right ventricular  size is moderately enlarged. There is moderately elevated pulmonary artery  systolic pressure. The estimated right ventricular systolic pressure is  57.3 mmHg.   3. Left atrial size was massively dilated.   4. Right atrial size was severely dilated.   5. The mitral valve is normal in structure. Severe mitral valve  regurgitation.   6. Tricuspid valve regurgitation is severe.    7. The aortic valve is tricuspid.  Aortic valve regurgitation is trivial.   8. Pulmonic valve regurgitation is moderate.   9. The inferior vena cava is normal in size with <50% respiratory  variability, suggesting right atrial pressure of 8 mmHg.   Comparison(s): LV is more vigorous from prior, imaged at 140 bpm + during  this study.   ASSESSMENT AND PLAN:  Atrial Fibrillation- chronic and permanent:  Rate is well controlled on lower dose of metoprolol now.  Now on Eliquis since Coumadin difficult to regulate.  Mitral Regurgitation: moderate- severe. 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 He has had AFib since 1970s. His LV function is good. He is not really symptomatic. The pulmonary HTN noted on Echo has been there at least 20 years. Will continue current therapy. Currently on lasix PRN Orthostatic Hypotension:  now resolved. On midodrine once a day only.  Hyperlipidemia:  Continue statin. Reports lab done at [facility] within the last year. Anemia due to acute blood loss. Continue Fe supplement 325 mg daily. Last Hgb 10.1 CKD stage 3b. Followed by Nephrology. Stable. Duodenal ulcer. Per GI. Resolved.  8.   Leg wound/cellulits. S/p toe amputation. Improved.  I will follow up in 6 months.   Signed, Troy Merlin [place] MD, [facility]    02/02/2023 12:13 PM

## 2023-02-02 ENCOUNTER — Ambulatory Visit: Payer: Medicare Other | Attending: Cardiology | Admitting: Cardiology

## 2023-02-02 ENCOUNTER — Encounter: Payer: Self-pay | Admitting: Cardiology

## 2023-02-02 VITALS — BP 112/60 | HR 78 | Ht 75.0 in | Wt 199.0 lb

## 2023-02-02 DIAGNOSIS — I951 Orthostatic hypotension: Secondary | ICD-10-CM | POA: Diagnosis not present

## 2023-02-02 DIAGNOSIS — I10 Essential (primary) hypertension: Secondary | ICD-10-CM | POA: Diagnosis not present

## 2023-02-02 DIAGNOSIS — I4821 Permanent atrial fibrillation: Secondary | ICD-10-CM | POA: Diagnosis not present

## 2023-02-02 DIAGNOSIS — I340 Nonrheumatic mitral (valve) insufficiency: Secondary | ICD-10-CM | POA: Diagnosis not present

## 2023-02-02 NOTE — Patient Instructions (Signed)

## 2023-02-05 DIAGNOSIS — D631 Anemia in chronic kidney disease: Secondary | ICD-10-CM | POA: Diagnosis not present

## 2023-02-05 DIAGNOSIS — N1832 Chronic kidney disease, stage 3b: Secondary | ICD-10-CM | POA: Diagnosis not present

## 2023-02-05 DIAGNOSIS — I129 Hypertensive chronic kidney disease with stage 1 through stage 4 chronic kidney disease, or unspecified chronic kidney disease: Secondary | ICD-10-CM | POA: Diagnosis not present

## 2023-02-05 DIAGNOSIS — N2581 Secondary hyperparathyroidism of renal origin: Secondary | ICD-10-CM | POA: Diagnosis not present

## 2023-02-21 ENCOUNTER — Other Ambulatory Visit (HOSPITAL_BASED_OUTPATIENT_CLINIC_OR_DEPARTMENT_OTHER): Payer: Self-pay | Admitting: Family Medicine

## 2023-03-21 ENCOUNTER — Other Ambulatory Visit (HOSPITAL_BASED_OUTPATIENT_CLINIC_OR_DEPARTMENT_OTHER): Payer: Self-pay | Admitting: Family Medicine

## 2023-03-21 DIAGNOSIS — E785 Hyperlipidemia, unspecified: Secondary | ICD-10-CM

## 2023-04-28 ENCOUNTER — Encounter (HOSPITAL_BASED_OUTPATIENT_CLINIC_OR_DEPARTMENT_OTHER): Payer: Self-pay | Admitting: Family Medicine

## 2023-04-28 ENCOUNTER — Ambulatory Visit (HOSPITAL_BASED_OUTPATIENT_CLINIC_OR_DEPARTMENT_OTHER): Payer: Medicare Other | Admitting: Family Medicine

## 2023-04-28 VITALS — BP 126/75 | HR 77 | Ht 75.0 in | Wt 207.2 lb

## 2023-04-28 DIAGNOSIS — M5412 Radiculopathy, cervical region: Secondary | ICD-10-CM | POA: Diagnosis not present

## 2023-04-28 DIAGNOSIS — H25813 Combined forms of age-related cataract, bilateral: Secondary | ICD-10-CM | POA: Insufficient documentation

## 2023-04-28 DIAGNOSIS — E1122 Type 2 diabetes mellitus with diabetic chronic kidney disease: Secondary | ICD-10-CM

## 2023-04-28 DIAGNOSIS — N1832 Chronic kidney disease, stage 3b: Secondary | ICD-10-CM

## 2023-04-28 DIAGNOSIS — Z7984 Long term (current) use of oral hypoglycemic drugs: Secondary | ICD-10-CM

## 2023-04-28 DIAGNOSIS — R600 Localized edema: Secondary | ICD-10-CM

## 2023-04-28 DIAGNOSIS — E119 Type 2 diabetes mellitus without complications: Secondary | ICD-10-CM | POA: Insufficient documentation

## 2023-04-28 LAB — POCT GLYCOSYLATED HEMOGLOBIN (HGB A1C)
HbA1c POC (<> result, manual entry): 6.1 % (ref 4.0–5.6)
HbA1c, POC (controlled diabetic range): 6.1 % (ref 0.0–7.0)
HbA1c, POC (prediabetic range): 6.1 % (ref 5.7–6.4)
Hemoglobin A1C: 6.1 % — AB (ref 4.0–5.6)

## 2023-04-28 MED ORDER — FUROSEMIDE 20 MG PO TABS: 20.0000 mg | ORAL_TABLET | Freq: Every day | ORAL | 1 refills | Status: DC | PRN

## 2023-04-28 MED ORDER — TADALAFIL 5 MG PO TABS: 5.0000 mg | ORAL_TABLET | Freq: Every day | ORAL | 1 refills | Status: DC

## 2023-04-28 MED ORDER — GLIPIZIDE ER 2.5 MG PO TB24: 2.5000 mg | ORAL_TABLET | Freq: Every day | ORAL | 1 refills | Status: DC

## 2023-04-28 NOTE — Assessment & Plan Note (Signed)
Patient continues with glipizide, denies any concerns today. Patient admitted for nephropathy screening, he will try to provide urine sample today or will return for labs if not able to do so Due for recheck of hemoglobin A1c, last checked about 5 months ago and was at goal at 6.9% at that time Can continue with current medication regimen, no changes necessary today

## 2023-04-28 NOTE — Progress Notes (Signed)
    Procedures performed today:    None.  Independent interpretation of notes and tests performed by another provider:   None.  Brief History, Exam, Impression, and Recommendations:    BP 126/75 (BP Location: Left Arm, Patient Position: Sitting, Cuff Size: Normal)   Pulse 77   Ht 6\' 3"  (1.905 m)   Wt 207 lb 3.2 oz (94 kg)   SpO2 97%   BMI 25.90 kg/m   Type 2 diabetes mellitus with stage 3b chronic kidney disease, without long-term current use of insulin (HCC) Assessment & Plan: Patient continues with glipizide, denies any concerns today. Patient admitted for nephropathy screening, he will try to provide urine sample today or will return for labs if not able to do so Due for recheck of hemoglobin A1c, last checked about 5 months ago and was at goal at 6.9% at that time Can continue with current medication regimen, no changes necessary today  Orders: -     POCT glycosylated hemoglobin (Hb A1C)  Lower extremity edema Assessment & Plan: Patient reports recent issue with bilateral lower extremity edema.  He continues with conservative measures, compression stockings.  Reports that swelling has been more noticeable over the past couple months or so.  No issues with shortness of breath or wheezing.  He indicates that he does try to be mindful of sodium in his diet and tries to limit this. On exam, mild edema is present, difficult to fully appreciate due to stockings and the bandages in place.  In reviewing chart, patient does have about 7 pound weight gain since his last visit with [place] about 3 months ago. We discussed options, can proceed with short use of furosemide.  Discussed concerns related to utilizing medication, however given lower extremity swelling and weight gain, would be reasonable to proceed with use for short duration.  We discussed potential adverse effects related to low blood pressure, impact on kidney.   Other orders -     glipiZIDE ER; Take 1 tablet (2.5 mg total) by  mouth daily with breakfast.  Dispense: 90 tablet; Refill: 1 -     Tadalafil; Take 1 tablet (5 mg total) by mouth daily.  Dispense: 30 tablet; Refill: 1 -     Furosemide; Take 1 tablet (20 mg total) by mouth daily as needed for edema.  Dispense: 30 tablet; Refill: 1  Return in about 4 months (around 08/26/2023).  Spent 34 minutes on this patient encounter, including preparation, chart review, face-to-face counseling with patient and coordination of care, and documentation of encounter   ___________________________________________ Quanta Roher de Peru, MD, ABFM, [facility] Primary Care and Sports Medicine [facility]

## 2023-04-28 NOTE — Patient Instructions (Signed)
  Medication Instructions:  Your physician recommends that you continue on your current medications as directed. Please refer to the Current Medication list given to you today. --If you need a refill on any your medications before your next appointment, please call your pharmacy first. If no refills are authorized on file call the office.--   Follow-Up: Your next appointment:   Your physician recommends that you schedule a follow-up appointment in: 4 months follow up  with Dr. de Peru  You will receive a text message or e-mail with a link to a survey about your care and experience with [place] today! We would greatly appreciate your feedback!   Thanks for letting us be apart of your health journey!!  Primary Care and Sports Medicine   Dr. Ceasar Mons Peru   We encourage you to activate your patient portal called "MyChart".  Sign up information is provided on this After Visit Summary.  MyChart is used to connect with patients for Virtual Visits (Telemedicine).  Patients are able to view lab/test results, encounter notes, upcoming appointments, etc.  Non-urgent messages can be sent to your provider as well. To learn more about what you can do with MyChart, please visit --  [email or web address].

## 2023-04-28 NOTE — Assessment & Plan Note (Signed)
Patient reports recent issue with bilateral lower extremity edema.  He continues with conservative measures, compression stockings.  Reports that swelling has been more noticeable over the past couple months or so.  No issues with shortness of breath or wheezing.  He indicates that he does try to be mindful of sodium in his diet and tries to limit this. On exam, mild edema is present, difficult to fully appreciate due to stockings and the bandages in place.  In reviewing chart, patient does have about 7 pound weight gain since his last visit with [place] about 3 months ago. We discussed options, can proceed with short use of furosemide.  Discussed concerns related to utilizing medication, however given lower extremity swelling and weight gain, would be reasonable to proceed with use for short duration.  We discussed potential adverse effects related to low blood pressure, impact on kidney.

## 2023-05-20 ENCOUNTER — Other Ambulatory Visit (HOSPITAL_BASED_OUTPATIENT_CLINIC_OR_DEPARTMENT_OTHER): Payer: Self-pay | Admitting: Family Medicine

## 2023-06-16 ENCOUNTER — Other Ambulatory Visit (HOSPITAL_BASED_OUTPATIENT_CLINIC_OR_DEPARTMENT_OTHER): Payer: Self-pay | Admitting: Family Medicine

## 2023-06-25 ENCOUNTER — Other Ambulatory Visit (HOSPITAL_BASED_OUTPATIENT_CLINIC_OR_DEPARTMENT_OTHER): Payer: Self-pay | Admitting: Family Medicine

## 2023-06-25 DIAGNOSIS — H25813 Combined forms of age-related cataract, bilateral: Secondary | ICD-10-CM | POA: Diagnosis not present

## 2023-07-05 DIAGNOSIS — Z7729 Contact with and (suspected ) exposure to other hazardous substances: Secondary | ICD-10-CM | POA: Insufficient documentation

## 2023-07-06 DIAGNOSIS — H25811 Combined forms of age-related cataract, right eye: Secondary | ICD-10-CM | POA: Diagnosis not present

## 2023-07-06 DIAGNOSIS — I10 Essential (primary) hypertension: Secondary | ICD-10-CM | POA: Diagnosis not present

## 2023-07-07 DIAGNOSIS — H25812 Combined forms of age-related cataract, left eye: Secondary | ICD-10-CM | POA: Diagnosis not present

## 2023-07-07 DIAGNOSIS — E119 Type 2 diabetes mellitus without complications: Secondary | ICD-10-CM | POA: Diagnosis not present

## 2023-07-07 DIAGNOSIS — Z9841 Cataract extraction status, right eye: Secondary | ICD-10-CM | POA: Diagnosis not present

## 2023-07-07 DIAGNOSIS — Z961 Presence of intraocular lens: Secondary | ICD-10-CM | POA: Diagnosis not present

## 2023-07-07 DIAGNOSIS — E1136 Type 2 diabetes mellitus with diabetic cataract: Secondary | ICD-10-CM | POA: Diagnosis not present

## 2023-07-21 ENCOUNTER — Other Ambulatory Visit (HOSPITAL_BASED_OUTPATIENT_CLINIC_OR_DEPARTMENT_OTHER): Payer: Self-pay | Admitting: Family Medicine

## 2023-07-22 ENCOUNTER — Other Ambulatory Visit (HOSPITAL_BASED_OUTPATIENT_CLINIC_OR_DEPARTMENT_OTHER): Payer: Self-pay | Admitting: Family Medicine

## 2023-07-22 ENCOUNTER — Other Ambulatory Visit: Payer: Self-pay | Admitting: Nurse Practitioner

## 2023-08-10 ENCOUNTER — Encounter (HOSPITAL_BASED_OUTPATIENT_CLINIC_OR_DEPARTMENT_OTHER): Payer: Medicare Other

## 2023-08-18 ENCOUNTER — Other Ambulatory Visit (HOSPITAL_BASED_OUTPATIENT_CLINIC_OR_DEPARTMENT_OTHER): Payer: Self-pay | Admitting: Family Medicine

## 2023-08-22 ENCOUNTER — Other Ambulatory Visit: Payer: Self-pay | Admitting: Gastroenterology

## 2023-08-26 ENCOUNTER — Ambulatory Visit (INDEPENDENT_AMBULATORY_CARE_PROVIDER_SITE_OTHER): Payer: Medicare Other | Admitting: Family Medicine

## 2023-08-26 ENCOUNTER — Encounter (HOSPITAL_BASED_OUTPATIENT_CLINIC_OR_DEPARTMENT_OTHER): Payer: Self-pay | Admitting: Family Medicine

## 2023-08-26 VITALS — BP 124/68 | HR 66 | Ht 75.0 in | Wt 210.6 lb

## 2023-08-26 DIAGNOSIS — N1832 Chronic kidney disease, stage 3b: Secondary | ICD-10-CM

## 2023-08-26 DIAGNOSIS — L603 Nail dystrophy: Secondary | ICD-10-CM | POA: Insufficient documentation

## 2023-08-26 DIAGNOSIS — R197 Diarrhea, unspecified: Secondary | ICD-10-CM | POA: Diagnosis not present

## 2023-08-26 DIAGNOSIS — Z7984 Long term (current) use of oral hypoglycemic drugs: Secondary | ICD-10-CM

## 2023-08-26 DIAGNOSIS — E1122 Type 2 diabetes mellitus with diabetic chronic kidney disease: Secondary | ICD-10-CM | POA: Diagnosis not present

## 2023-08-26 LAB — POCT GLYCOSYLATED HEMOGLOBIN (HGB A1C)
HbA1c POC (<> result, manual entry): 5.5 % (ref 4.0–5.6)
HbA1c, POC (controlled diabetic range): 5.5 % (ref 0.0–7.0)
Hemoglobin A1C: 5.5 % (ref 4.0–5.6)

## 2023-08-26 MED ORDER — SILVER SULFADIAZINE 1 % EX CREA: 1.0000 | TOPICAL_CREAM | Freq: Every day | CUTANEOUS | 0 refills | Status: AC

## 2023-08-26 NOTE — Assessment & Plan Note (Signed)
 Patient continues with glipizide , denies any concerns today. Patient needs to complete nephropathy screening, he will try to provide urine sample today or will return for labs if not able to do so Due for recheck of hemoglobin A1c, within normal range today.  Can continue with current medication regimen, no changes necessary today

## 2023-08-26 NOTE — Progress Notes (Signed)
    Procedures performed today:    None.  Independent interpretation of notes and tests performed by another provider:   None.  Brief History, Exam, Impression, and Recommendations:    BP 124/68 (BP Location: Left Arm, Patient Position: Sitting, Cuff Size: Normal)   Pulse 66   Ht 6' 3 (1.905 m)   Wt 210 lb 9.6 oz (95.5 kg)   SpO2 97%   BMI 26.32 kg/m   Type 2 diabetes mellitus with stage 3b chronic kidney disease, without long-term current use of insulin  [facility]) Assessment & Plan: Patient continues with glipizide , denies any concerns today. Patient needs to complete nephropathy screening, he will try to provide urine sample today or will return for labs if not able to do so Due for recheck of hemoglobin A1c, within normal range today.  Can continue with current medication regimen, no changes necessary today  Orders: -     POCT glycosylated hemoglobin (Hb A1C) -     Microalbumin / creatinine urine ratio  Diarrhea, unspecified type Assessment & Plan: Present for about a month.  Did start after recent travel.  Will occur intermittently.  He may have more normal bowel movements and then suddenly had an episode of diarrhea.  He has not had any blood in his stool.  Occasional abdominal discomfort, however no persistent abdominal pain.  No associated nausea or vomiting. Given recent travel and persistent diarrhea, we can proceed with stool testing as below.  Also discussed consideration for further evaluation with GI.  He has met with GI previously for colonoscopy. We will manage results of labs accordingly  Orders: -     Stool culture; Future -     Ova and parasite examination; Future -     Fecal lactoferrin, quant; Future -     Ambulatory referral to Gastroenterology -     Calprotectin, Fecal; Future  Other orders -     Silver  sulfADIAZINE ; Apply 1 Application topically daily.  Dispense: 85 g; Refill: 0  Return in about 3 months (around 11/26/2023) for  diabetes.   ___________________________________________ Jamorris Ndiaye de [place], MD, ABFM, [facility] Primary Care and Sports Medicine [facility]

## 2023-08-26 NOTE — Patient Instructions (Signed)
   Medication Instructions:  Your physician recommends that you continue on your current medications as directed. Please refer to the Current Medication list given to you today. --If you need a refill on any your medications before your next appointment, please call your pharmacy first. If no refills are authorized on file call the office.-- Lab Work: Your physician has recommended that you have lab work today: today If you have labs (blood work) drawn today and your tests are completely normal, you will receive your results via MyChart message OR a phone call from our staff.  Please ensure you check your voicemail in the event that you authorized detailed messages to be left on a delegated number. If you have any lab test that is abnormal or we need to change your treatment, we will call you to review the results.    Follow-Up: Your next appointment:   Your physician recommends that you schedule a follow-up appointment in: 3-4 month follow up  with Dr. de Peru  You will receive a text message or e-mail with a link to a survey about your care and experience with [place] today! We would greatly appreciate your feedback!   Thanks for letting us be apart of your health journey!!  Primary Care and Sports Medicine   Dr. Ceasar Mons [place]   We encourage you to activate your patient portal called "MyChart".  Sign up information is provided on this After Visit Summary.  MyChart is used to connect with patients for Virtual Visits (Telemedicine).  Patients are able to view lab/test results, encounter notes, upcoming appointments, etc.  Non-urgent messages can be sent to your provider as well. To learn more about what you can do with MyChart, please visit --  [email or web address].

## 2023-08-26 NOTE — Assessment & Plan Note (Signed)
 Present for about a month.  Did start after recent travel.  Will occur intermittently.  He may have more normal bowel movements and then suddenly had an episode of diarrhea.  He has not had any blood in his stool.  Occasional abdominal discomfort, however no persistent abdominal pain.  No associated nausea or vomiting. Given recent travel and persistent diarrhea, we can proceed with stool testing as below.  Also discussed consideration for further evaluation with GI.  He has met with GI previously for colonoscopy. We will manage results of labs accordingly

## 2023-08-30 DIAGNOSIS — E1122 Type 2 diabetes mellitus with diabetic chronic kidney disease: Secondary | ICD-10-CM | POA: Diagnosis not present

## 2023-08-30 DIAGNOSIS — N1832 Chronic kidney disease, stage 3b: Secondary | ICD-10-CM | POA: Diagnosis not present

## 2023-08-31 ENCOUNTER — Ambulatory Visit (HOSPITAL_BASED_OUTPATIENT_CLINIC_OR_DEPARTMENT_OTHER): Payer: Self-pay | Admitting: Family Medicine

## 2023-08-31 LAB — MICROALBUMIN / CREATININE URINE RATIO
Creatinine, Urine: 97.2 mg/dL
Microalb/Creat Ratio: 198 mg/g{creat} — ABNORMAL HIGH (ref 0–29)
Microalbumin, Urine: 192.7 ug/mL

## 2023-09-07 LAB — STOOL CULTURE: E coli, Shiga toxin Assay: NEGATIVE

## 2023-09-07 LAB — CALPROTECTIN, FECAL: Calprotectin, Fecal: 39 ug/g (ref 0–120)

## 2023-09-07 LAB — OVA AND PARASITE EXAMINATION

## 2023-09-07 LAB — FECAL LACTOFERRIN, QUANT: Lactoferrin, Fecal, Quant.: 2.69 ug/mL (ref 0.00–7.24)

## 2023-09-08 ENCOUNTER — Encounter (HOSPITAL_BASED_OUTPATIENT_CLINIC_OR_DEPARTMENT_OTHER): Payer: Self-pay | Admitting: *Deleted

## 2023-09-09 ENCOUNTER — Telehealth (HOSPITAL_BASED_OUTPATIENT_CLINIC_OR_DEPARTMENT_OTHER): Payer: Self-pay | Admitting: *Deleted

## 2023-09-09 NOTE — Telephone Encounter (Signed)
 Copied from CRM [id number]. Topic: Clinical - Lab/Test Results >> Sep 09, 2023  2:57 PM Selinda RAMAN wrote: Reason for CRM: The patient called in checking on the status of his latest labs and urine and stool cultures. I read what his provider stated about his urine but told him the stool cultures still have not been released. He states he would like a call as soon as that is available because he still has been having bouts of diarrhea and wants to know what is going on. He does see [facility] within the next and wants his provider to know that as well. Please assist patient further

## 2023-09-09 NOTE — Telephone Encounter (Signed)
 Spoke with [facility], this result should be coming over soon  Patient advised to keep appt with [facility] and we would follow up once we receive the lab results for stool

## 2023-09-11 ENCOUNTER — Emergency Department (HOSPITAL_BASED_OUTPATIENT_CLINIC_OR_DEPARTMENT_OTHER): Admitting: Radiology

## 2023-09-11 ENCOUNTER — Other Ambulatory Visit: Payer: Self-pay

## 2023-09-11 ENCOUNTER — Emergency Department (HOSPITAL_BASED_OUTPATIENT_CLINIC_OR_DEPARTMENT_OTHER)
Admission: EM | Admit: 2023-09-11 | Discharge: 2023-09-11 | Disposition: A | Discharge status: Home / Self Care | Attending: Emergency Medicine | Admitting: Emergency Medicine

## 2023-09-11 ENCOUNTER — Encounter (HOSPITAL_BASED_OUTPATIENT_CLINIC_OR_DEPARTMENT_OTHER): Payer: Self-pay

## 2023-09-11 DIAGNOSIS — E119 Type 2 diabetes mellitus without complications: Secondary | ICD-10-CM | POA: Diagnosis not present

## 2023-09-11 DIAGNOSIS — Z7984 Long term (current) use of oral hypoglycemic drugs: Secondary | ICD-10-CM | POA: Insufficient documentation

## 2023-09-11 DIAGNOSIS — R7989 Other specified abnormal findings of blood chemistry: Secondary | ICD-10-CM

## 2023-09-11 DIAGNOSIS — R197 Diarrhea, unspecified: Secondary | ICD-10-CM | POA: Diagnosis not present

## 2023-09-11 DIAGNOSIS — Z79899 Other long term (current) drug therapy: Secondary | ICD-10-CM | POA: Diagnosis not present

## 2023-09-11 DIAGNOSIS — I10 Essential (primary) hypertension: Secondary | ICD-10-CM | POA: Insufficient documentation

## 2023-09-11 DIAGNOSIS — R944 Abnormal results of kidney function studies: Secondary | ICD-10-CM | POA: Diagnosis not present

## 2023-09-11 DIAGNOSIS — J069 Acute upper respiratory infection, unspecified: Secondary | ICD-10-CM | POA: Insufficient documentation

## 2023-09-11 DIAGNOSIS — D649 Anemia, unspecified: Secondary | ICD-10-CM | POA: Insufficient documentation

## 2023-09-11 DIAGNOSIS — I517 Cardiomegaly: Secondary | ICD-10-CM | POA: Diagnosis not present

## 2023-09-11 DIAGNOSIS — R059 Cough, unspecified: Secondary | ICD-10-CM | POA: Diagnosis not present

## 2023-09-11 DIAGNOSIS — Z7901 Long term (current) use of anticoagulants: Secondary | ICD-10-CM | POA: Insufficient documentation

## 2023-09-11 DIAGNOSIS — I700 Atherosclerosis of aorta: Secondary | ICD-10-CM | POA: Diagnosis not present

## 2023-09-11 DIAGNOSIS — U071 COVID-19: Secondary | ICD-10-CM | POA: Diagnosis not present

## 2023-09-11 LAB — COMPREHENSIVE METABOLIC PANEL WITH GFR
ALT: 9 U/L (ref 0–44)
AST: 20 U/L (ref 15–41)
Albumin: 3.9 g/dL (ref 3.5–5.0)
Alkaline Phosphatase: 81 U/L (ref 38–126)
Anion gap: 13 (ref 5–15)
BUN: 30 mg/dL — ABNORMAL HIGH (ref 8–23)
CO2: 20 mmol/L — ABNORMAL LOW (ref 22–32)
Calcium: 9.3 mg/dL (ref 8.9–10.3)
Chloride: 106 mmol/L (ref 98–111)
Creatinine, Ser: 1.79 mg/dL — ABNORMAL HIGH (ref 0.61–1.24)
GFR, Estimated: 39 mL/min — ABNORMAL LOW (ref >60)
Glucose, Bld: 90 mg/dL (ref 70–99)
Potassium: 3.6 mmol/L (ref 3.5–5.1)
Sodium: 139 mmol/L (ref 135–145)
Total Bilirubin: 1.3 mg/dL — ABNORMAL HIGH (ref 0.0–1.2)
Total Protein: 6.6 g/dL (ref 6.5–8.1)

## 2023-09-11 LAB — CBC WITH DIFFERENTIAL/PLATELET
Abs Immature Granulocytes: 0.03 10*3/uL (ref 0.00–0.07)
Basophils Absolute: 0 10*3/uL (ref 0.0–0.1)
Basophils Relative: 1 %
Eosinophils Absolute: 0.1 10*3/uL (ref 0.0–0.5)
Eosinophils Relative: 1 %
HCT: 34.5 % — ABNORMAL LOW (ref 39.0–52.0)
Hemoglobin: 11.2 g/dL — ABNORMAL LOW (ref 13.0–17.0)
Immature Granulocytes: 1 %
Lymphocytes Relative: 7 %
Lymphs Abs: 0.5 10*3/uL — ABNORMAL LOW (ref 0.7–4.0)
MCH: 33.6 pg (ref 26.0–34.0)
MCHC: 32.5 g/dL (ref 30.0–36.0)
MCV: 103.6 fL — ABNORMAL HIGH (ref 80.0–100.0)
Monocytes Absolute: 1.1 10*3/uL — ABNORMAL HIGH (ref 0.1–1.0)
Monocytes Relative: 17 %
Neutro Abs: 4.8 10*3/uL (ref 1.7–7.7)
Neutrophils Relative %: 73 %
Platelets: 113 10*3/uL — ABNORMAL LOW (ref 150–400)
RBC: 3.33 MIL/uL — ABNORMAL LOW (ref 4.22–5.81)
RDW: 13.4 % (ref 11.5–15.5)
WBC: 6.5 10*3/uL (ref 4.0–10.5)
nRBC: 0 % (ref 0.0–0.2)

## 2023-09-11 LAB — MAGNESIUM: Magnesium: 1.9 mg/dL (ref 1.7–2.4)

## 2023-09-11 LAB — RESP PANEL BY RT-PCR (RSV, FLU A&B, COVID)  RVPGX2
Influenza A by PCR: NEGATIVE
Influenza B by PCR: NEGATIVE
Resp Syncytial Virus by PCR: NEGATIVE
SARS Coronavirus 2 by RT PCR: POSITIVE — AB

## 2023-09-11 MED ORDER — AZITHROMYCIN 250 MG PO TABS
1000.0000 mg | ORAL_TABLET | Freq: Once | ORAL | Status: AC
  Administered 2023-09-11: 1000 mg via ORAL
  Filled 2023-09-11: qty 4

## 2023-09-11 NOTE — ED Triage Notes (Signed)
 Pt complaining of diarrhea that started a couple of weeks ago. Had a stool sample sent off last Tuesday by pcp but has not heard any results. Yesterday he started with liquid diarrhea that was an orange in color and has turned clear overnight. He did take immodium and pepto bismol for it. He also has a cough that started yesterday that is bringing up green phlegm.

## 2023-09-11 NOTE — Discharge Instructions (Addendum)
 You appear to have an upper respiratory infection (URI). An upper respiratory tract infection, or cold, is a viral infection of the air passages leading to the lungs. It should improve gradually after 5-7 days. You may have a lingering cough that lasts for 2- 4 weeks after the infection.  This could also be contributing to the worsening diarrhea.  Your flu, covid, and RSV test are still in process, you may review your results on your MyChart portal.  If any of these are positive, this would not change management of your symptoms.  Your chest x-ray did not show any signs of pneumonia.  You have been treated for a potential traveler's diarrhea here in the emergency room with a dose of antibiotic called azithromycin.  If your diarrhea does not start to improve in the next 48 hours, please follow-up with your PCP within the next week for further management of your symptoms and a potential GI referral.  Please keep well-hydrated at home with water and electrolyte drinks such as Pedialyte.  Your electrolytes were normal today.  You were found to have a low hemoglobin today at 11.2.  It appears you run low at baseline.  You also have an elevated creatinine which is a measure of your kidney function.  Please have both your blood counts (hemoglobin) and creatinine rechecked and monitored by your PCP within the next month.  Home care instructions:  You can take Tylenol  as directed on the packaging for fever reduction and pain relief.    For cough: honey 1/2 to 1 teaspoon (you can dilute the honey in water or another fluid).  You can also use guaifenesin  and dextromethorphan for cough which are over-the-counter medications. You can use a humidifier for chest congestion and cough.  If you don't have a humidifier, you can sit in the bathroom with the hot shower running.      For sore throat: try warm salt water gargles, cepacol lozenges, throat spray, warm tea or water with lemon/honey, popsicles or ice, or OTC cold  relief medicine for throat discomfort.    For congestion: Flonase  (Fluticasone ) 1-2 sprays in each nostril daily. This is an over the counter medication.    It is important to stay hydrated: drink plenty of fluids (water, gatorade/powerade/pedialyte, juices, or teas) to keep your throat moisturized and help further relieve irritation/discomfort.   Your illness is contagious and can be spread to others, especially during the first 3 or 4 days. It cannot be cured by antibiotics or other medicines. Take basic precautions such as washing your hands often, covering your mouth when you cough or sneeze, and avoiding public places where you could spread your illness to others.   Follow-up instructions: Please follow-up with your primary care provider for further evaluation of your symptoms if you are not feeling better within the next 5 days.   Return instructions:  Please return to the Emergency Department if you experience worsening symptoms.  RETURN IMMEDIATELY IF you develop shortness of breath, confusion or altered mental status, a new rash, become dizzy, faint, or poorly responsive, or are unable to be cared for at home. Please return if you have persistent vomiting and cannot keep down fluids or develop a fever that is not controlled by tylenol  or motrin.   Please return if you have any other emergent concerns.

## 2023-09-11 NOTE — ED Provider Notes (Signed)
 [place] [facility] DE[facility]MENT AT [facility] Provider Note   CSN: [id number] Arrival date & time: 09/11/23  9150     Patient presents with: Diarrhea   [name] is a 78 y.o. male with history of A-fib on Eliquis , hypertension, type 2 diabetes, presents with concern for diarrhea.  States he has had diarrhea on and off for months.  However, in the last month, he has had increase in the frequency of his diarrhea.  Unable to quantify how many episodes of diarrhea he has per day.  Usually diarrhea is brown and watery appearing, and nonbloody.  Today his stool appeared orange and mucousy, still without blood.  Denies any fever or chills.  Reports mild cramping abdominal pain when he has the diarrhea, but no abdominal pain otherwise.  He reports travel to [place] about 1 month ago.  However, he states the diarrhea was present before this, but did seem to worsen after this trip.  Denies any recent antibiotic use.  Reports he has been taking Imodium and Pepto-Bismol which does seem to help the diarrhea.  Also reports a productive cough that started yesterday with green phlegm.  His wife at bedside also reports similar symptoms.    Diarrhea      Prior to Admission medications   Medication Sig Start Date End Date Taking? Authorizing Provider  acetaminophen  (TYLENOL ) 500 MG tablet Take 1,000 mg by mouth as needed for mild pain.    [provider]  allopurinol  (ZYLOPRIM ) 100 MG tablet TAKE 1 TABLET BY MOUTH 2 TIMES A DAY 07/21/23   [place], Quintin PARAS, MD  apixaban  (ELIQUIS ) 5 MG TABS tablet Take 1 tablet (5 mg total) by mouth 2 (two) times daily. 05/10/22   Rosario Leatrice FERNS, MD  feeding supplement (ENSURE ENLIVE / ENSURE PLUS) LIQD Take 237 mLs by mouth 2 (two) times daily between meals. 05/10/22   Rosario Leatrice I, MD  furosemide  (LASIX ) 20 MG tablet TAKE 1 (ONE) TABLET BY MOUTH DAILY AS NEEDED FOR EDEMA 08/18/23   [place], Quintin PARAS, MD  glipiZIDE  (GLUCOTROL  XL) 2.5 MG 24  hr tablet TAKE 1 TABLET BY MOUTH DAILY WITH BREAKFAST 07/22/23   [place], Quintin PARAS, MD  Glucose Blood (FREESTYLE LITE TEST VI)  12/08/12   [provider]  Iron , Ferrous Sulfate , 325 (65 Fe) MG TABS Take 325 mg by mouth daily. 05/27/22   [place], Peter M, MD  metoprolol  tartrate (LOPRESSOR ) 25 MG tablet TAKE 1/2 TABLET BY MOUTH TWICE A DAY 07/22/23   [place], Quintin PARAS, MD  mycophenolate (CELLCEPT) 500 MG tablet Take 1 tablet by mouth every morning. 02/28/23   [provider]  NASADROPS SALINE ON THE GO 0.9 % SOLN Apply topically. 05/28/22   [provider]  pantoprazole  (PROTONIX ) 40 MG tablet Take 1 tablet (40 mg total) by mouth daily. Please schedule a yearly follow up for any additional refills. Thank you 08/23/23   Stacia Glendia BRAVO, MD  silver  sulfADIAZINE  (SILVADENE ) 1 % cream Apply 1 Application topically daily. 08/26/23   [place], Raymond J, MD  simvastatin  (ZOCOR ) 20 MG tablet TAKE 1 TABLET BY MOUTH AT BEDTIME 03/22/23   [place], Quintin PARAS, MD  tadalafil  (CIALIS ) 5 MG tablet TAKE 1 TABLET BY MOUTH DAILY 08/18/23   [place], Quintin PARAS, MD  tamsulosin  (FLOMAX ) 0.4 MG CAPS capsule TAKE 1 CAPSULE BY MOUTH DAILY 08/18/23   [place], Quintin PARAS, MD  Wound Cleansers (VASHE CLEANSING) SOLN Apply topically. 05/25/22  [provider]    Allergies: Atorvastatin and Lisinopril    Review of Systems  Gastrointestinal:  Positive for diarrhea.    Updated Vital Signs BP (!) 146/76   Pulse 75   Temp 98.2 F (36.8 C)   Resp 18   Ht 6' 3 (1.905 m)   Wt 95.3 kg   SpO2 99%   BMI 26.25 kg/m   Physical Exam Vitals and nursing note reviewed.  Constitutional:      General: He is not in acute distress.    Appearance: He is well-developed.  HENT:     Head: Normocephalic and atraumatic.     Mouth/Throat:     Comments: Mucous membranes appear slightly dry  Eyes:     Conjunctiva/sclera: Conjunctivae normal.    Cardiovascular:     Rate and Rhythm: Normal rate and regular  rhythm.     Heart sounds: No murmur heard. Pulmonary:     Effort: Pulmonary effort is normal. No respiratory distress.     Breath sounds: Normal breath sounds.     Comments: Wet sounding cough on exam Abdominal:     Palpations: Abdomen is soft.     Tenderness: There is no abdominal tenderness.   Musculoskeletal:        General: No swelling.     Cervical back: Neck supple.   Skin:    General: Skin is warm and dry.     Capillary Refill: Capillary refill takes less than 2 seconds.   Neurological:     Mental Status: He is alert.   Psychiatric:        Mood and Affect: Mood normal.     (all labs ordered are listed, but only abnormal results are displayed) Labs Reviewed  RESP PANEL BY RT-PCR (RSV, FLU A&B, COVID)  RVPGX2 - Abnormal; Notable for the following components:      Result Value   SARS Coronavirus 2 by RT PCR POSITIVE (*)    All other components within normal limits  CBC WITH DIFFERENTIAL/PLATELET - Abnormal; Notable for the following components:   RBC 3.33 (*)    Hemoglobin 11.2 (*)    HCT 34.5 (*)    MCV 103.6 (*)    Platelets 113 (*)    Lymphs Abs 0.5 (*)    Monocytes Absolute 1.1 (*)    All other components within normal limits  COMPREHENSIVE METABOLIC PANEL WITH GFR - Abnormal; Notable for the following components:   CO2 20 (*)    BUN 30 (*)    Creatinine, Ser 1.79 (*)    Total Bilirubin 1.3 (*)    GFR, Estimated 39 (*)    All other components within normal limits  GASTROINTESTINAL PANEL BY PCR, STOOL (REPLACES STOOL CULTURE)  MAGNESIUM    EKG: None  Radiology: DG Chest 2 View Result Date: 09/11/2023 CLINICAL DATA:  Cough. EXAM: CHEST - 2 VIEW COMPARISON:  Chest radiograph dated 08/24/2022. FINDINGS: Stable mild cardiomegaly. Aortic atherosclerosis. No focal consolidation, pleural effusion, or pneumothorax. No acute osseous abnormality. IMPRESSION: 1. No acute cardiopulmonary findings. 2. Stable mild cardiomegaly. Electronically Signed   By: Harrietta Sherry M.D.   On: 09/11/2023 10:27     Procedures   Medications Ordered in the ED  azithromycin (ZITHROMAX) tablet 1,000 mg (1,000 mg Oral Given 09/11/23 1114)  Medical Decision Making Amount and/or Complexity of Data Reviewed Labs: ordered. Radiology: ordered.  Risk Prescription drug management.     Differential diagnosis includes but is not limited to gastritis, gastroenteritis, IBS, IBD, traveler's diarrhea, viral URI, pneumonia  ED Course:  Upon initial evaluation, patient is well-appearing, no acute distress.  Stable vitals aside from his elevated blood pressure 161/80.  Afebrile, not tachycardic.  Abdomen is soft and nontender to palpation.  Mucous membranes appear slightly dry.  Also has a mild dry cough on exam.  However, lungs sound clear to auscultation bilaterally.  Labs Ordered: I Ordered, and personally interpreted labs.  The pertinent results include:   CBC without leukocytosis.  Hemoglobin low at 11.2, but appears to be above his baseline of 9 CMP without any electrolyte abnormalities.  Elevated creatinine 1.39, elevated BUN at 30, GFR 39.  These appear to be at baseline Magnesium within normal limits COVID, flu, RSV testing pending GI stool panel ordered, patient unable to provide stool sample  Medications Given: Azithromycin  Upon re-evaluation, patient remains well-appearing with stable vitals.  Discussed that although he has been having diarrhea, electrolytes are normal.  Given no fevers, no leukocytosis, no abdominal pain, no bloody stools, I have lower concern for infectious etiology to his diarrhea at this time.  Stool culture obtained from PCP was negative for Salmonella, Shigella, Campylobacter, and E. coli.  However, he does state that diarrhea worsened after his trip to [place].  He was able to provide a stool sample again today, but will treat prophylactically for potential traveler's diarrhea with a one-time dose of  azithromycin here today.  It does sound like he has had issues with diarrhea chronically, and so I question if this may be more of a IBS or IBD.  No severe abdominal pain or lab abnormalities, I have lower concern for acute intra-abdominal pathology at this time that would require a additional CT abdomen pelvis imaging.  Recommended he follow-up with his PCP within the next week for recheck of symptoms and potential GI referral. He also has been having a productive cough for the past day.  Chest x-ray without consolidation, no fevers, low concern for pneumonia.  COVID, flu, RSV testing is pending, but suspect some sort of viral URI pathology.  He remains afebrile, not tachycardic, satting 97% on room air.   Patient is stable and appropriate for discharge home at this time    Impression: Viral URI Diarrhea  Disposition:  The patient was discharged home with instructions to keep well-hydrated at home with water and electrolyte drinks such as Pedialyte.  We discussed that his creatinine was elevated and his hemoglobin was low, he understands he needs to follow-up with his PCP in the next week for further management of these lab abnormalities as well as further management of his diarrhea.  We discussed symptom control with over-the-counter medications for his cough. Return precautions given.    Record Review: External records from outside source obtained and reviewed including  PCP stool culture from 6/12 is negative for Salmonella, Shigella, Campylobacter, E. coli     This chart was dictated using voice recognition software, Dragon. Despite the best efforts of this provider to proofread and correct errors, errors may still occur which can change documentation meaning.       Final diagnoses:  Diarrhea, unspecified type  Upper respiratory tract infection, unspecified type  Low hemoglobin  Elevated serum creatinine    ED Discharge Orders     None  Veta Palma,  PA-C 09/11/23 1123    Charlyn Sora, MD 09/12/23 [phone]

## 2023-09-15 ENCOUNTER — Other Ambulatory Visit (HOSPITAL_BASED_OUTPATIENT_CLINIC_OR_DEPARTMENT_OTHER): Payer: Self-pay | Admitting: Family Medicine

## 2023-09-15 DIAGNOSIS — E785 Hyperlipidemia, unspecified: Secondary | ICD-10-CM

## 2023-09-23 DIAGNOSIS — E11621 Type 2 diabetes mellitus with foot ulcer: Secondary | ICD-10-CM | POA: Diagnosis not present

## 2023-09-23 DIAGNOSIS — M310 Hypersensitivity angiitis: Secondary | ICD-10-CM | POA: Diagnosis not present

## 2023-09-23 DIAGNOSIS — Z7984 Long term (current) use of oral hypoglycemic drugs: Secondary | ICD-10-CM | POA: Diagnosis not present

## 2023-09-23 DIAGNOSIS — I872 Venous insufficiency (chronic) (peripheral): Secondary | ICD-10-CM | POA: Diagnosis not present

## 2023-09-23 DIAGNOSIS — E11622 Type 2 diabetes mellitus with other skin ulcer: Secondary | ICD-10-CM | POA: Diagnosis not present

## 2023-09-23 DIAGNOSIS — R634 Abnormal weight loss: Secondary | ICD-10-CM | POA: Diagnosis not present

## 2023-09-23 DIAGNOSIS — Z7952 Long term (current) use of systemic steroids: Secondary | ICD-10-CM | POA: Diagnosis not present

## 2023-09-23 DIAGNOSIS — L97222 Non-pressure chronic ulcer of left calf with fat layer exposed: Secondary | ICD-10-CM | POA: Diagnosis not present

## 2023-09-23 DIAGNOSIS — L97522 Non-pressure chronic ulcer of other part of left foot with fat layer exposed: Secondary | ICD-10-CM | POA: Diagnosis not present

## 2023-09-27 DIAGNOSIS — I129 Hypertensive chronic kidney disease with stage 1 through stage 4 chronic kidney disease, or unspecified chronic kidney disease: Secondary | ICD-10-CM | POA: Diagnosis not present

## 2023-09-27 DIAGNOSIS — N1832 Chronic kidney disease, stage 3b: Secondary | ICD-10-CM | POA: Diagnosis not present

## 2023-09-27 DIAGNOSIS — N2581 Secondary hyperparathyroidism of renal origin: Secondary | ICD-10-CM | POA: Diagnosis not present

## 2023-09-27 DIAGNOSIS — D631 Anemia in chronic kidney disease: Secondary | ICD-10-CM | POA: Diagnosis not present

## 2023-09-30 DIAGNOSIS — L97222 Non-pressure chronic ulcer of left calf with fat layer exposed: Secondary | ICD-10-CM | POA: Diagnosis not present

## 2023-09-30 DIAGNOSIS — M310 Hypersensitivity angiitis: Secondary | ICD-10-CM | POA: Diagnosis not present

## 2023-09-30 DIAGNOSIS — L97522 Non-pressure chronic ulcer of other part of left foot with fat layer exposed: Secondary | ICD-10-CM | POA: Diagnosis not present

## 2023-09-30 DIAGNOSIS — I872 Venous insufficiency (chronic) (peripheral): Secondary | ICD-10-CM | POA: Diagnosis not present

## 2023-09-30 DIAGNOSIS — E11622 Type 2 diabetes mellitus with other skin ulcer: Secondary | ICD-10-CM | POA: Diagnosis not present

## 2023-09-30 DIAGNOSIS — E11621 Type 2 diabetes mellitus with foot ulcer: Secondary | ICD-10-CM | POA: Diagnosis not present

## 2023-10-05 ENCOUNTER — Ambulatory Visit (INDEPENDENT_AMBULATORY_CARE_PROVIDER_SITE_OTHER): Admitting: *Deleted

## 2023-10-05 ENCOUNTER — Encounter (HOSPITAL_BASED_OUTPATIENT_CLINIC_OR_DEPARTMENT_OTHER): Payer: Self-pay

## 2023-10-05 DIAGNOSIS — Z0000 Encounter for general adult medical examination without abnormal findings: Secondary | ICD-10-CM | POA: Diagnosis not present

## 2023-10-05 NOTE — Patient Instructions (Signed)
 [name] , Thank you for taking time to come for your Medicare Wellness Visit. I appreciate your ongoing commitment to your health goals. Please review the following plan we discussed and let me know if I can assist you in the future.   Screening recommendations/referrals: Recommended yearly ophthalmology/optometry visit for glaucoma screening and checkup Recommended yearly dental visit for hygiene and checkup  Vaccinations: Influenza vaccine: Patient declined Pneumococcal vaccine: completed  Tdap vaccine: completed Shingles vaccine: information provided    Advanced directives: patient will bring in copy  Conditions/risks identified: Falls  Next appointment: 1 year   Preventive Care 7 Years and Older, Male Preventive care refers to lifestyle choices and visits with your health care provider that can promote health and wellness. What does preventive care include? A yearly physical exam. This is also called an annual well check. Dental exams once or twice a year. Routine eye exams. Ask your health care provider how often you should have your eyes checked. Personal lifestyle choices, including: Daily care of your teeth and gums. Regular physical activity. Eating a healthy diet. Avoiding tobacco and drug use. Limiting alcohol use. Practicing safe sex. Taking low doses of aspirin every day. Taking vitamin and mineral supplements as recommended by your health care provider. What happens during an annual well check? The services and screenings done by your health care provider during your annual well check will depend on your age, overall health, lifestyle risk factors, and family history of disease. Counseling  Your health care provider may ask you questions about your: Alcohol use. Tobacco use. Drug use. Emotional well-being. Home and relationship well-being. Sexual activity. Eating habits. History of falls. Memory and ability to understand (cognition). Work and work  Astronomer. Screening  You may have the following tests or measurements: Height, weight, and BMI. Blood pressure. Lipid and cholesterol levels. These may be checked every 5 years, or more frequently if you are over 36 years old. Skin check. Lung cancer screening. You may have this screening every year starting at age 35 if you have a 30-pack-year history of smoking and currently smoke or have quit within the past 15 years. Fecal occult blood test (FOBT) of the stool. You may have this test every year starting at age 36. Flexible sigmoidoscopy or colonoscopy. You may have a sigmoidoscopy every 5 years or a colonoscopy every 10 years starting at age 45. Prostate cancer screening. Recommendations will vary depending on your family history and other risks. Hepatitis C blood test. Hepatitis B blood test. Sexually transmitted disease (STD) testing. Diabetes screening. This is done by checking your blood sugar (glucose) after you have not eaten for a while (fasting). You may have this done every 1-3 years. Abdominal aortic aneurysm (AAA) screening. You may need this if you are a current or former smoker. Osteoporosis. You may be screened starting at age 12 if you are at high risk. Talk with your health care provider about your test results, treatment options, and if necessary, the need for more tests. Vaccines  Your health care provider may recommend certain vaccines, such as: Influenza vaccine. This is recommended every year. Tetanus, diphtheria, and acellular pertussis (Tdap, Td) vaccine. You may need a Td booster every 10 years. Zoster vaccine. You may need this after age 1. Pneumococcal 13-valent conjugate (PCV13) vaccine. One dose is recommended after age 48. Pneumococcal polysaccharide (PPSV23) vaccine. One dose is recommended after age 86. Talk to your health care provider about which screenings and vaccines you need and how often you  need them. This information is not intended to replace  advice given to you by your health care provider. Make sure you discuss any questions you have with your health care provider. Document Released: 03/29/2015 Document Revised: 11/20/2015 Document Reviewed: 01/01/2015 Elsevier Interactive Patient Education  2017 [employer].  Fall Prevention in the Home Falls can cause injuries. They can happen to people of all ages. There are many things you can do to make your home safe and to help prevent falls. What can I do on the outside of my home? Regularly fix the edges of walkways and driveways and fix any cracks. Remove anything that might make you trip as you walk through a door, such as a raised step or threshold. Trim any bushes or trees on the path to your home. Use bright outdoor lighting. Clear any walking paths of anything that might make someone trip, such as rocks or tools. Regularly check to see if handrails are loose or broken. Make sure that both sides of any steps have handrails. Any raised decks and porches should have guardrails on the edges. Have any leaves, snow, or ice cleared regularly. Use sand or salt on walking paths during winter. Clean up any spills in your garage right away. This includes oil or grease spills. What can I do in the bathroom? Use night lights. Install grab bars by the toilet and in the tub and shower. Do not use towel bars as grab bars. Use non-skid mats or decals in the tub or shower. If you need to sit down in the shower, use a plastic, non-slip stool. Keep the floor dry. Clean up any water that spills on the floor as soon as it happens. Remove soap buildup in the tub or shower regularly. Attach bath mats securely with double-sided non-slip rug tape. Do not have throw rugs and other things on the floor that can make you trip. What can I do in the bedroom? Use night lights. Make sure that you have a light by your bed that is easy to reach. Do not use any sheets or blankets that are too big for your bed.  They should not hang down onto the floor. Have a firm chair that has side arms. You can use this for support while you get dressed. Do not have throw rugs and other things on the floor that can make you trip. What can I do in the kitchen? Clean up any spills right away. Avoid walking on wet floors. Keep items that you use a lot in easy-to-reach places. If you need to reach something above you, use a strong step stool that has a grab bar. Keep electrical cords out of the way. Do not use floor polish or wax that makes floors slippery. If you must use wax, use non-skid floor wax. Do not have throw rugs and other things on the floor that can make you trip. What can I do with my stairs? Do not leave any items on the stairs. Make sure that there are handrails on both sides of the stairs and use them. Fix handrails that are broken or loose. Make sure that handrails are as long as the stairways. Check any carpeting to make sure that it is firmly attached to the stairs. Fix any carpet that is loose or worn. Avoid having throw rugs at the top or bottom of the stairs. If you do have throw rugs, attach them to the floor with carpet tape. Make sure that you have a light switch  at the top of the stairs and the bottom of the stairs. If you do not have them, ask someone to add them for you. What else can I do to help prevent falls? Wear shoes that: Do not have high heels. Have rubber bottoms. Are comfortable and fit you well. Are closed at the toe. Do not wear sandals. If you use a stepladder: Make sure that it is fully opened. Do not climb a closed stepladder. Make sure that both sides of the stepladder are locked into place. Ask someone to hold it for you, if possible. Clearly mark and make sure that you can see: Any grab bars or handrails. First and last steps. Where the edge of each step is. Use tools that help you move around (mobility aids) if they are needed. These  include: Canes. Walkers. Scooters. Crutches. Turn on the lights when you go into a dark area. Replace any light bulbs as soon as they burn out. Set up your furniture so you have a clear path. Avoid moving your furniture around. If any of your floors are uneven, fix them. If there are any pets around you, be aware of where they are. Review your medicines with your doctor. Some medicines can make you feel dizzy. This can increase your chance of falling. Ask your doctor what other things that you can do to help prevent falls. This information is not intended to replace advice given to you by your health care provider. Make sure you discuss any questions you have with your health care provider. Document Released: 12/27/2008 Document Revised: 08/08/2015 Document Reviewed: 04/06/2014 Elsevier Interactive Patient Education  2017 [employer].

## 2023-10-05 NOTE — Progress Notes (Signed)
 Subjective:   [name] is a 78 y.o. male who presents for Medicare Annual/Subsequent preventive examination.  Visit Complete: Virtual I connected with  Jarred Purtee Orth on 10/05/23 by a audio enabled telemedicine application and verified that I am speaking with the correct person using two identifiers.  Patient Location: Home  Provider Location: Office/Clinic  I discussed the limitations of evaluation and management by telemedicine. The patient expressed understanding and agreed to proceed.  Vital Signs: Because this visit was a virtual/telehealth visit, some criteria may be missing or patient reported. Any vitals not documented were not able to be obtained and vitals that have been documented are patient reported.  Patient Medicare AWV questionnaire was completed by the patient on 10/05/23; I have confirmed that all information answered by patient is correct and no changes since this date.        Objective:    There were no vitals filed for this visit. There is no height or weight on file to calculate BMI.     09/11/2023    9:07 AM 08/04/2022    9:27 AM 05/04/2022    1:15 PM 05/03/2022    1:45 PM 05/03/2022    7:15 AM 08/27/2016    6:50 PM 03/01/2015    6:16 AM  Advanced Directives  Does Patient Have a Medical Advance Directive? No Yes Yes Yes No No  No   Type of Special educational needs teacher of [place];Living will  Living will;Healthcare Power of Attorney     Does patient want to make changes to medical advance directive?    No - Patient declined     Copy of Healthcare Power of Attorney in Chart?  No - copy requested  No - copy requested     Would patient like information on creating a medical advance directive?      No - Patient declined  No - patient declined information      Data saved with a previous flowsheet row definition     Current Medications (verified) Outpatient Encounter Medications as of 10/05/2023  Medication Sig   acetaminophen  (TYLENOL ) 500  MG tablet Take 1,000 mg by mouth as needed for mild pain.   allopurinol  (ZYLOPRIM ) 100 MG tablet TAKE 1 TABLET BY MOUTH 2 TIMES A DAY   apixaban  (ELIQUIS ) 5 MG TABS tablet Take 1 tablet (5 mg total) by mouth 2 (two) times daily.   feeding supplement (ENSURE ENLIVE / ENSURE PLUS) LIQD Take 237 mLs by mouth 2 (two) times daily between meals.   furosemide  (LASIX ) 20 MG tablet TAKE 1 (ONE) TABLET BY MOUTH DAILY AS NEEDED FOR EDEMA   glipiZIDE  (GLUCOTROL  XL) 2.5 MG 24 hr tablet TAKE 1 TABLET BY MOUTH DAILY WITH BREAKFAST   Glucose Blood (FREESTYLE LITE TEST VI)    Iron , Ferrous Sulfate , 325 (65 Fe) MG TABS Take 325 mg by mouth daily.   metoprolol  tartrate (LOPRESSOR ) 25 MG tablet TAKE 1/2 TABLET BY MOUTH TWICE A DAY   mycophenolate (CELLCEPT) 500 MG tablet Take 1 tablet by mouth every morning.   NASADROPS SALINE ON THE GO 0.9 % SOLN Apply topically.   pantoprazole  (PROTONIX ) 40 MG tablet Take 1 tablet (40 mg total) by mouth daily. Please schedule a yearly follow up for any additional refills. Thank you   silver  sulfADIAZINE  (SILVADENE ) 1 % cream Apply 1 Application topically daily.   simvastatin  (ZOCOR ) 20 MG tablet TAKE 1 TABLET BY MOUTH AT BEDTIME   tadalafil  (CIALIS ) 5 MG tablet TAKE 1  TABLET BY MOUTH DAILY   tamsulosin  (FLOMAX ) 0.4 MG CAPS capsule TAKE 1 CAPSULE BY MOUTH DAILY   Wound Cleansers (VASHE CLEANSING) SOLN Apply topically.   No facility-administered encounter medications on file as of 10/05/2023.    Allergies (verified) Atorvastatin and Lisinopril   History: Past Medical History:  Diagnosis Date   Atrial fibrillation (HCC)    Chronic anticoagulation    Complication of anesthesia    ' hard time waking up per patient    Diabetes mellitus    type 2   GERD (gastroesophageal reflux disease)    Hyperlipidemia    Hypertension    Mitral regurgitation    a. 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HCC)     chronic atrial fib   Pulmonary hypertension (HCC)    Pulmonary hypertension (HCC)    Past Surgical History:  Procedure Laterality Date   BIOPSY  05/04/2022   Procedure: BIOPSY;  Surgeon: Eda Iha, MD;  Location: [facility] ENDOSCOPY;  Service: Gastroenterology;;   CARDIOVERSION     CHOLECYSTECTOMY N/A 03/01/2015   Procedure: LAPAROSCOPIC CHOLECYSTECTOMY;  Surgeon: Lynda Leos, MD;  Location: [facility] ORS;  Service: General;  Laterality: N/A;   ESOPHAGOGASTRODUODENOSCOPY (EGD) WITH PROPOFOL  N/A 05/04/2022   Procedure: ESOPHAGOGASTRODUODENOSCOPY (EGD) WITH PROPOFOL ;  Surgeon: Eda Iha, MD;  Location: [facility] END[facility]COPY;  Service: Gastroenterology;  Laterality: N/A;   spur on heel     Family History  Problem Relation Age of Onset   Hypertension Mother    Alzheimer's disease Mother    Diabetes Mother    Heart disease Father        cabgx2, respiratory failure, pacemaker   Diabetes Brother    Hyperlipidemia Brother    Colon cancer Neg Hx    Esophageal cancer Neg Hx    Rectal cancer Neg Hx    Stomach cancer Neg Hx    Social History   Socioeconomic History   Marital status: Married    Spouse name: Not on file   Number of children: 2   Years of education: Not on file   Highest education level: Not on file  Occupational History   Occupation: retired Copywriter, advertising  Tobacco Use   Smoking status: Never    Passive exposure: Never   Smokeless tobacco: Never  Vaping Use   Vaping status: Never Used  Substance and Sexual Activity   Alcohol use: Not Currently   Drug use: No   Sexual activity: Not on file  Other Topics Concern   Not on file  Social History Narrative   Not on file   Social Drivers of Health   Financial Resource Strain: Low Risk  (08/04/2022)   Overall Financial Resource Strain (CARDIA)    Difficulty of Paying Living Expenses: Not hard at all  Food Insecurity: Low Risk  (12/17/2022)   Received from [facility]   Hunger Vital Sign    Within the past 12  months, you worried that your food would run out before you got money to buy more: Never true    Within the past 12 months, the food you bought just didn't last and you didn't have money to get more. : Never true  Transportation Needs: No Transportation Needs (12/29/2022)   Received from [facility]   PRAPARE - Transportation    Lack of Transportation (Medical): No    Lack of Transportation (Non-Medical): No  Physical Activity: Inactive (08/04/2022)   Exercise Vital Sign  Days of Exercise per Week: 0 days    Minutes of Exercise per Session: 0 min  Stress: No Stress Concern Present (08/04/2022)   [employer] of Occupational Health - Occupational Stress Questionnaire    Feeling of Stress : Not at all  Social Connections: Socially Integrated (08/04/2022)   Social Connection and Isolation Panel    Frequency of Communication with Friends and Family: More than three times a week    Frequency of Social Gatherings with Friends and Family: More than three times a week    Attends Religious Services: More than 4 times per year    Active Member of [employer] or Organizations: Yes    Attends Engineer, structural: More than 4 times per year    Marital Status: Married    Tobacco Counseling Counseling given: Not Answered   Clinical Intake:                        Activities of Daily Living     No data to display           Patient Care Team: de [place], Quintin PARAS, MD as PCP - General (Family Medicine) Sw[place], Peter M, MD as PCP - Cardiology (Cardiology) Dial, Dekarlos M, DPM as Referring Physician (Podiatry) Bennetta Chew, MD as Referring Physician (Family Medicine) Stacia Glendia BRAVO, MD as Consulting Physician (Gastroenterology)  Indicate any recent Medical Services you may have received from other than [facility] providers in the past year (date may be approximate).     Assessment:   This is a routine wellness examination for [name].  Hearing/Vision screen No  results found.   Goals Addressed   None    Depression Screen    08/26/2023    2:01 PM 04/28/2023    2:03 PM 01/25/2023    2:28 PM 08/24/2022    1:32 PM 08/04/2022    9:21 AM 05/18/2022   11:04 AM 03/24/2022    3:19 PM  PHQ 2/9 Scores  PHQ - 2 Score 0 0 0 1 0 0 0  PHQ- 9 Score 0 0 0 2  0 0  Exception Documentation      Medical reason Medical reason    Fall Risk    08/26/2023    2:01 PM 04/28/2023    2:03 PM 01/25/2023    2:28 PM 08/24/2022    1:32 PM 08/04/2022    9:19 AM  Fall Risk   Falls in the past year? 0 0 0 0 0  Number falls in past yr: 0 0 0  0  Injury with Fall? 0 0 0  0  Risk for fall due to : No Fall Risks No Fall Risks No Fall Risks  Impaired balance/gait;Impaired mobility  Follow up Falls evaluation completed Falls evaluation completed Falls evaluation completed  Falls prevention discussed;Education provided    MEDICARE RISK AT HOME:    TIMED UP AND GO:  Was the test performed?  No    Cognitive Function:      10/09/2020    9:00 AM  Montreal Cognitive Assessment   Visuospatial/ Executive (0/5) 2  Naming (0/3) 3  Attention: Read list of digits (0/2) 0  Attention: Read list of letters (0/1) 1  Attention: Serial 7 subtraction starting at 100 (0/3) 0  Language: Repeat phrase (0/2) 1  Language : Fluency (0/1) 0  Abstraction (0/2) 1  Delayed Recall (0/5) 1  Orientation (0/6) 6  Total 15  Adjusted Score (based on education) 15  08/04/2022    9:24 AM  6CIT Screen  What Year? 0 points  What month? 0 points  What time? 0 points  Count back from 20 0 points  Months in reverse 0 points  Repeat phrase 0 points  Total Score 0 points    Immunizations Immunization History  Administered Date(s) Administered   Influenza, High Dose Seasonal PF 12/14/2012, 01/10/2013, 12/06/2013, 12/14/2013, 01/17/2015, 12/15/2015, 02/25/2016, 01/04/2017   Influenza-Unspecified 02/13/2008, 02/05/2010, 03/30/2011, 12/15/2011, 12/16/2011, 11/14/2013, 12/24/2018, 12/14/2020    Moderna Sars-Covid-2 Vaccination 06/07/2019, 07/05/2019   Pneumococcal Conjugate-13 12/14/2013, 03/13/2015   Pneumococcal Polysaccharide-23 03/11/2000, 12/14/2012   Pneumococcal-Unspecified 04/16/2004   Tdap 09/07/2006, 07/15/2011, 07/23/2022    TDAP status: Up to date  Flu Vaccine status: Declined, Education has been provided regarding the importance of this vaccine but patient still declined. Advised may receive this vaccine at local pharmacy or Health Dept. Aware to provide a copy of the vaccination record if obtained from local pharmacy or Health Dept. Verbalized acceptance and understanding.  Pneumococcal vaccine status: Up to date  Covid-19 vaccine status: Completed vaccines  Qualifies for Shingles Vaccine? Yes   Zostavax completed No   Shingrix Completed?: No.    Education has been provided regarding the importance of this vaccine. Patient has been advised to call insurance company to determine out of pocket expense if they have not yet received this vaccine. Advised may also receive vaccine at local pharmacy or Health Dept. Verbalized acceptance and understanding.  Screening Tests Health Maintenance  Topic Date Due   Hepatitis C Screening  Never done   Zoster Vaccines- Shingrix (1 of 2) Never done   COVID-19 Vaccine (3 - Moderna risk series) 08/02/2019   FOOT EXAM  06/15/2023   OPHTHALMOLOGY EXAM  06/15/2023   INFLUENZA VACCINE  10/15/2023   HEMOGLOBIN A1C  02/25/2024   Diabetic kidney evaluation - Urine ACR  08/29/2024   Diabetic kidney evaluation - eGFR measurement  09/10/2024   Medicare Annual Wellness (AWV)  10/04/2024   DTaP/Tdap/Td (4 - Td or Tdap) 07/22/2032   Pneumococcal Vaccine: 50+ Years  Completed   Hepatitis B Vaccines  Aged Out   HPV VACCINES  Aged Out   Meningococcal B Vaccine  Aged Out   Colonoscopy  Discontinued    Health Maintenance  Health Maintenance Due  Topic Date Due   Hepatitis C Screening  Never done   Zoster Vaccines- Shingrix (1 of 2)  Never done   COVID-19 Vaccine (3 - Moderna risk series) 08/02/2019   FOOT EXAM  06/15/2023   OPHTHALMOLOGY EXAM  06/15/2023    Colorectal cancer screening: No longer required.   Lung Cancer Screening: (Low Dose CT Chest recommended if Age 36-80 years, 20 pack-year currently smoking OR have quit w/in 15years.) does not qualify.   Lung Cancer Screening Referral: NA  Additional Screening:  Hepatitis C Screening: does qualify; Patient will discuss with wife   Vision Screening: Recommended annual ophthalmology exams for early detection of glaucoma and other disorders of the eye. Is the patient up to date with their annual eye exam?  Yes  Who is the provider or what is the name of the office in which the patient attends annual eye exams? [facility] If pt is not established with a provider, would they like to be referred to a provider to establish care? No .   Dental Screening: Recommended annual dental exams for proper oral hygiene  Diabetic Foot Exam: next office visit  Community Resource Referral / Chronic Care  Management: CRR required this visit?  No   CCM required this visit?  No     Plan:     I have personally reviewed and noted the following in the patient's chart:   Medical and social history Use of alcohol, tobacco or illicit drugs  Current medications and supplements including opioid prescriptions. Patient is not currently taking opioid prescriptions. Functional ability and status Nutritional status Physical activity Advanced directives List of other physicians Hospitalizations, surgeries, and ER visits in previous 12 months Vitals Screenings to include cognitive, depression, and falls Referrals and appointments  In addition, I have reviewed and discussed with patient certain preventive protocols, quality metrics, and best practice recommendations. A written personalized care plan for preventive services as well as general preventive health  recommendations were provided to patient.     Kerri JONELLE Fuel, CMA   10/05/2023   After Visit Summary: (MyChart) Due to this being a telephonic visit, the after visit summary with patients personalized plan was offered to patient via MyChart   Nurse Notes:  [name] , Thank you for taking time to come for your Medicare Wellness Visit. I appreciate your ongoing commitment to your health goals. Please review the following plan we discussed and let me know if I can assist you in the future.   These are the goals we discussed:  Goals      Patient Stated     Patient stated he wants to work on getting healthier and getting the wound on his LT lower leg at his calf completely healed. He wants to get his strength back.         This is a list of the screening recommended for you and due dates:  Health Maintenance  Topic Date Due   Hepatitis C Screening  Never done   Zoster (Shingles) Vaccine (1 of 2) Never done   COVID-19 Vaccine (3 - Moderna risk series) 08/02/2019   Complete foot exam   06/15/2023   Eye exam for diabetics  06/15/2023   Flu Shot  10/15/2023   Hemoglobin A1C  02/25/2024   Yearly kidney health urinalysis for diabetes  08/29/2024   Yearly kidney function blood test for diabetes  09/10/2024   Medicare Annual Wellness Visit  10/04/2024   DTaP/Tdap/Td vaccine (4 - Td or Tdap) 07/22/2032   Pneumococcal Vaccine for age over 48  Completed   Hepatitis B Vaccine  Aged Out   HPV Vaccine  Aged Out   Meningitis B Vaccine  Aged Out   Colon Cancer Screening  Discontinued

## 2023-10-07 DIAGNOSIS — E11621 Type 2 diabetes mellitus with foot ulcer: Secondary | ICD-10-CM | POA: Diagnosis not present

## 2023-10-07 DIAGNOSIS — L97222 Non-pressure chronic ulcer of left calf with fat layer exposed: Secondary | ICD-10-CM | POA: Diagnosis not present

## 2023-10-07 DIAGNOSIS — I872 Venous insufficiency (chronic) (peripheral): Secondary | ICD-10-CM | POA: Diagnosis not present

## 2023-10-07 DIAGNOSIS — M310 Hypersensitivity angiitis: Secondary | ICD-10-CM | POA: Diagnosis not present

## 2023-10-07 DIAGNOSIS — L97522 Non-pressure chronic ulcer of other part of left foot with fat layer exposed: Secondary | ICD-10-CM | POA: Diagnosis not present

## 2023-10-07 DIAGNOSIS — E11622 Type 2 diabetes mellitus with other skin ulcer: Secondary | ICD-10-CM | POA: Diagnosis not present

## 2023-10-12 ENCOUNTER — Other Ambulatory Visit (HOSPITAL_BASED_OUTPATIENT_CLINIC_OR_DEPARTMENT_OTHER): Payer: Self-pay | Admitting: Family Medicine

## 2023-10-12 DIAGNOSIS — S81802D Unspecified open wound, left lower leg, subsequent encounter: Secondary | ICD-10-CM | POA: Diagnosis not present

## 2023-10-12 DIAGNOSIS — I872 Venous insufficiency (chronic) (peripheral): Secondary | ICD-10-CM | POA: Diagnosis not present

## 2023-10-14 DIAGNOSIS — L97522 Non-pressure chronic ulcer of other part of left foot with fat layer exposed: Secondary | ICD-10-CM | POA: Diagnosis not present

## 2023-10-14 DIAGNOSIS — M310 Hypersensitivity angiitis: Secondary | ICD-10-CM | POA: Diagnosis not present

## 2023-10-14 DIAGNOSIS — E11621 Type 2 diabetes mellitus with foot ulcer: Secondary | ICD-10-CM | POA: Diagnosis not present

## 2023-10-14 DIAGNOSIS — L97222 Non-pressure chronic ulcer of left calf with fat layer exposed: Secondary | ICD-10-CM | POA: Diagnosis not present

## 2023-10-14 DIAGNOSIS — E11622 Type 2 diabetes mellitus with other skin ulcer: Secondary | ICD-10-CM | POA: Diagnosis not present

## 2023-10-14 DIAGNOSIS — I872 Venous insufficiency (chronic) (peripheral): Secondary | ICD-10-CM | POA: Diagnosis not present

## 2023-10-17 ENCOUNTER — Other Ambulatory Visit (HOSPITAL_BASED_OUTPATIENT_CLINIC_OR_DEPARTMENT_OTHER): Payer: Self-pay | Admitting: Family Medicine

## 2023-10-21 DIAGNOSIS — L97521 Non-pressure chronic ulcer of other part of left foot limited to breakdown of skin: Secondary | ICD-10-CM | POA: Diagnosis not present

## 2023-10-21 DIAGNOSIS — I776 Arteritis, unspecified: Secondary | ICD-10-CM | POA: Diagnosis not present

## 2023-10-21 DIAGNOSIS — L97222 Non-pressure chronic ulcer of left calf with fat layer exposed: Secondary | ICD-10-CM | POA: Diagnosis not present

## 2023-10-21 DIAGNOSIS — L97522 Non-pressure chronic ulcer of other part of left foot with fat layer exposed: Secondary | ICD-10-CM | POA: Diagnosis not present

## 2023-10-21 DIAGNOSIS — E11622 Type 2 diabetes mellitus with other skin ulcer: Secondary | ICD-10-CM | POA: Diagnosis not present

## 2023-10-21 DIAGNOSIS — I872 Venous insufficiency (chronic) (peripheral): Secondary | ICD-10-CM | POA: Diagnosis not present

## 2023-10-21 DIAGNOSIS — Z7984 Long term (current) use of oral hypoglycemic drugs: Secondary | ICD-10-CM | POA: Diagnosis not present

## 2023-10-21 DIAGNOSIS — E11621 Type 2 diabetes mellitus with foot ulcer: Secondary | ICD-10-CM | POA: Diagnosis not present

## 2023-10-22 ENCOUNTER — Telehealth: Payer: Self-pay

## 2023-10-22 NOTE — Telephone Encounter (Signed)
   Pre-operative Risk Assessment    Patient Name: [name]  DOB: 1945/06/22 MRN: [id number]   Date of last office visit: 02/02/23 PETER SWAZILAND, MD Date of next office visit: 12/02/23 PETER SWAZILAND, MD   Request for Surgical Clearance    Procedure:  LUMBAR EPIDURAL  Date of Surgery:  Clearance TBD                                Surgeon:  DEATRICE MANUS, MD Surgeon's Group or Practice Name:  [facility] Phone number:  [phone] Fax number:  [phone]   Type of Clearance Requested:   - Medical  - Pharmacy:  Hold Apixaban  (Eliquis ) 3 DAYS PRIOR AND RESUME AFTER   Type of Anesthesia:  Not Indicated   Additional requests/questions:    Signed, Lucie DELENA Ku   10/22/2023, 5:35 PM

## 2023-10-25 DIAGNOSIS — M5412 Radiculopathy, cervical region: Secondary | ICD-10-CM | POA: Diagnosis not present

## 2023-10-26 NOTE — Telephone Encounter (Signed)
 Per Pt  he had Injection yesterday

## 2023-10-28 ENCOUNTER — Encounter: Payer: Self-pay | Admitting: Family Medicine

## 2023-10-28 DIAGNOSIS — I776 Arteritis, unspecified: Secondary | ICD-10-CM | POA: Diagnosis not present

## 2023-10-28 DIAGNOSIS — E11621 Type 2 diabetes mellitus with foot ulcer: Secondary | ICD-10-CM | POA: Diagnosis not present

## 2023-10-28 DIAGNOSIS — L97521 Non-pressure chronic ulcer of other part of left foot limited to breakdown of skin: Secondary | ICD-10-CM | POA: Diagnosis not present

## 2023-10-28 DIAGNOSIS — L97522 Non-pressure chronic ulcer of other part of left foot with fat layer exposed: Secondary | ICD-10-CM | POA: Diagnosis not present

## 2023-10-28 DIAGNOSIS — E11622 Type 2 diabetes mellitus with other skin ulcer: Secondary | ICD-10-CM | POA: Diagnosis not present

## 2023-10-28 DIAGNOSIS — L97222 Non-pressure chronic ulcer of left calf with fat layer exposed: Secondary | ICD-10-CM | POA: Diagnosis not present

## 2023-10-28 DIAGNOSIS — I872 Venous insufficiency (chronic) (peripheral): Secondary | ICD-10-CM | POA: Diagnosis not present

## 2023-10-29 NOTE — Telephone Encounter (Signed)
 Patient with diagnosis of atrial fibrillation on Eliquis  for anticoagulation.    Procedure:  LUMBAR EPIDURAL   Date of Surgery:  Clearance TBD    CHA2DS2-VASc Score = 4   This indicates a 4.8% annual risk of stroke. The patient's score is based upon: CHF History: 0 HTN History: 1 Diabetes History: 1 Stroke History: 0 Vascular Disease History: 0 Age Score: 2 Gender Score: 0    CrCl 47 Platelet count 113  Patient has not had an Afib/aflutter ablation within the last 3 months or DCCV within the last 30 days  Per office protocol, patient can hold Eliquis  for 3 days prior to procedure.   Patient will not need bridging with Lovenox (enoxaparin) around procedure.  **This guidance is not considered finalized until pre-operative APP has relayed final recommendations.**

## 2023-11-04 DIAGNOSIS — I872 Venous insufficiency (chronic) (peripheral): Secondary | ICD-10-CM | POA: Diagnosis not present

## 2023-11-04 DIAGNOSIS — E11621 Type 2 diabetes mellitus with foot ulcer: Secondary | ICD-10-CM | POA: Diagnosis not present

## 2023-11-04 DIAGNOSIS — L97521 Non-pressure chronic ulcer of other part of left foot limited to breakdown of skin: Secondary | ICD-10-CM | POA: Diagnosis not present

## 2023-11-04 DIAGNOSIS — L97222 Non-pressure chronic ulcer of left calf with fat layer exposed: Secondary | ICD-10-CM | POA: Diagnosis not present

## 2023-11-04 DIAGNOSIS — E11622 Type 2 diabetes mellitus with other skin ulcer: Secondary | ICD-10-CM | POA: Diagnosis not present

## 2023-11-04 DIAGNOSIS — I776 Arteritis, unspecified: Secondary | ICD-10-CM | POA: Diagnosis not present

## 2023-11-17 ENCOUNTER — Other Ambulatory Visit (HOSPITAL_BASED_OUTPATIENT_CLINIC_OR_DEPARTMENT_OTHER): Payer: Self-pay | Admitting: Family Medicine

## 2023-11-18 DIAGNOSIS — M310 Hypersensitivity angiitis: Secondary | ICD-10-CM | POA: Diagnosis not present

## 2023-11-18 DIAGNOSIS — S81002A Unspecified open wound, left knee, initial encounter: Secondary | ICD-10-CM | POA: Diagnosis not present

## 2023-11-18 DIAGNOSIS — Z7984 Long term (current) use of oral hypoglycemic drugs: Secondary | ICD-10-CM | POA: Diagnosis not present

## 2023-11-18 DIAGNOSIS — I872 Venous insufficiency (chronic) (peripheral): Secondary | ICD-10-CM | POA: Diagnosis not present

## 2023-11-18 DIAGNOSIS — L97222 Non-pressure chronic ulcer of left calf with fat layer exposed: Secondary | ICD-10-CM | POA: Diagnosis not present

## 2023-11-18 DIAGNOSIS — E11622 Type 2 diabetes mellitus with other skin ulcer: Secondary | ICD-10-CM | POA: Diagnosis not present

## 2023-11-18 DIAGNOSIS — L97822 Non-pressure chronic ulcer of other part of left lower leg with fat layer exposed: Secondary | ICD-10-CM | POA: Diagnosis not present

## 2023-11-25 DIAGNOSIS — M310 Hypersensitivity angiitis: Secondary | ICD-10-CM | POA: Diagnosis not present

## 2023-11-25 DIAGNOSIS — L97222 Non-pressure chronic ulcer of left calf with fat layer exposed: Secondary | ICD-10-CM | POA: Diagnosis not present

## 2023-11-25 DIAGNOSIS — L97522 Non-pressure chronic ulcer of other part of left foot with fat layer exposed: Secondary | ICD-10-CM | POA: Diagnosis not present

## 2023-11-25 DIAGNOSIS — I872 Venous insufficiency (chronic) (peripheral): Secondary | ICD-10-CM | POA: Diagnosis not present

## 2023-11-25 DIAGNOSIS — Z7984 Long term (current) use of oral hypoglycemic drugs: Secondary | ICD-10-CM | POA: Diagnosis not present

## 2023-11-25 DIAGNOSIS — E11621 Type 2 diabetes mellitus with foot ulcer: Secondary | ICD-10-CM | POA: Diagnosis not present

## 2023-11-25 DIAGNOSIS — L97822 Non-pressure chronic ulcer of other part of left lower leg with fat layer exposed: Secondary | ICD-10-CM | POA: Diagnosis not present

## 2023-11-25 DIAGNOSIS — E11622 Type 2 diabetes mellitus with other skin ulcer: Secondary | ICD-10-CM | POA: Diagnosis not present

## 2023-12-01 ENCOUNTER — Telehealth: Payer: Self-pay

## 2023-12-01 NOTE — Telephone Encounter (Signed)
 Pt has appt 01/04/24 with Peter [place], MD. At that time preop clearance will be addressed.  Will update the surgeons office

## 2023-12-01 NOTE — Telephone Encounter (Signed)
   Pre-operative Risk Assessment    Patient Name: [name]  DOB: 1945-04-23 MRN: [id number]   Date of last office visit: 02/02/2023 Date of next office visit: 01/04/2024   Request for Surgical Clearance    Procedure:  Lumbar Epidural   Date of Surgery:  Clearance TBD                                 Surgeon:  Dr. Deatrice Manus, MD Surgeon's Group or Practice Name:  [facility] Phone number:  [phone] Fax number:  [phone]   Type of Clearance Requested:   - Medical  - Pharmacy:  Hold Apixaban  (Eliquis ) for 3 days   Type of Anesthesia:  None    Additional requests/questions:  N/A  [name]   12/01/2023, 2:48 PM

## 2023-12-01 NOTE — Telephone Encounter (Signed)
   Name: [name]  DOB: 15-Jun-1945  MRN: [id number]  Primary Cardiologist: Peter Swaziland, MD  Chart reviewed as part of pre-operative protocol coverage.Dr. Swaziland requested a 4 month follow up at previous visit that was not scheduled. The patient has an upcoming visit scheduled with Dr. Peter [place]iland on 01/04/2024 at which time clearance can be addressed in case there are any issues that would impact surgical recommendations.   Lumbar Epidural is not currently scheduled, with date TBD. I added preop FYI to appointment note so that provider is aware to address at time of outpatient visit.  Per office protocol the cardiology provider should forward their finalized clearance decision and recommendations regarding antiplatelet therapy to the requesting party below.    This message will also be routed to pharmacy pool for input on holding eliquis  as requested below so that this information is available to the clearing provider at time of patient's appointment.   I will route this message as FYI to requesting party and remove this message from the preop box as separate preop APP input not needed at this time.   Please call with any questions.  Thadius Smisek E Grainne Knights, NP  12/01/2023, 3:06 PM

## 2023-12-02 ENCOUNTER — Ambulatory Visit: Admitting: Cardiology

## 2023-12-02 DIAGNOSIS — L97222 Non-pressure chronic ulcer of left calf with fat layer exposed: Secondary | ICD-10-CM | POA: Diagnosis not present

## 2023-12-02 DIAGNOSIS — L97822 Non-pressure chronic ulcer of other part of left lower leg with fat layer exposed: Secondary | ICD-10-CM | POA: Diagnosis not present

## 2023-12-02 DIAGNOSIS — M310 Hypersensitivity angiitis: Secondary | ICD-10-CM | POA: Diagnosis not present

## 2023-12-02 DIAGNOSIS — I872 Venous insufficiency (chronic) (peripheral): Secondary | ICD-10-CM | POA: Diagnosis not present

## 2023-12-02 DIAGNOSIS — E11622 Type 2 diabetes mellitus with other skin ulcer: Secondary | ICD-10-CM | POA: Diagnosis not present

## 2023-12-02 DIAGNOSIS — Z7984 Long term (current) use of oral hypoglycemic drugs: Secondary | ICD-10-CM | POA: Diagnosis not present

## 2023-12-06 ENCOUNTER — Ambulatory Visit (HOSPITAL_BASED_OUTPATIENT_CLINIC_OR_DEPARTMENT_OTHER): Admitting: Family Medicine

## 2023-12-06 DIAGNOSIS — L97822 Non-pressure chronic ulcer of other part of left lower leg with fat layer exposed: Secondary | ICD-10-CM | POA: Diagnosis not present

## 2023-12-06 DIAGNOSIS — Z7984 Long term (current) use of oral hypoglycemic drugs: Secondary | ICD-10-CM | POA: Diagnosis not present

## 2023-12-06 DIAGNOSIS — I872 Venous insufficiency (chronic) (peripheral): Secondary | ICD-10-CM | POA: Diagnosis not present

## 2023-12-06 DIAGNOSIS — E11622 Type 2 diabetes mellitus with other skin ulcer: Secondary | ICD-10-CM | POA: Diagnosis not present

## 2023-12-06 DIAGNOSIS — M310 Hypersensitivity angiitis: Secondary | ICD-10-CM | POA: Diagnosis not present

## 2023-12-06 DIAGNOSIS — L97222 Non-pressure chronic ulcer of left calf with fat layer exposed: Secondary | ICD-10-CM | POA: Diagnosis not present

## 2023-12-06 NOTE — Telephone Encounter (Signed)
 Patient with diagnosis of atrial fibrillation on Eliquis  for anticoagulation.    Procedure:  Lumbar Epidural    Date of Surgery:  Clearance TBD       CHA2DS2-VASc Score = 4   This indicates a 4.8% annual risk of stroke. The patient's score is based upon: CHF History: 0 HTN History: 1 Diabetes History: 1 Stroke History: 0 Vascular Disease History: 0 Age Score: 2 Gender Score: 0  CrCl 47 Platelet count 113  Patient has not had an Afib/aflutter ablation or Watchman within the last 3 months or DCCV within the last 30 days   Per office protocol, patient can hold Eliquis  for 3 days prior to procedure.   Patient will not need bridging with Lovenox (enoxaparin) around procedure.  **This guidance is not considered finalized until pre-operative APP has relayed final recommendations.**

## 2023-12-07 DIAGNOSIS — I10 Essential (primary) hypertension: Secondary | ICD-10-CM | POA: Diagnosis not present

## 2023-12-07 DIAGNOSIS — H25812 Combined forms of age-related cataract, left eye: Secondary | ICD-10-CM | POA: Diagnosis not present

## 2023-12-08 DIAGNOSIS — Z9842 Cataract extraction status, left eye: Secondary | ICD-10-CM | POA: Diagnosis not present

## 2023-12-08 DIAGNOSIS — Z9841 Cataract extraction status, right eye: Secondary | ICD-10-CM | POA: Diagnosis not present

## 2023-12-08 DIAGNOSIS — E119 Type 2 diabetes mellitus without complications: Secondary | ICD-10-CM | POA: Diagnosis not present

## 2023-12-08 DIAGNOSIS — Z961 Presence of intraocular lens: Secondary | ICD-10-CM | POA: Diagnosis not present

## 2023-12-16 DIAGNOSIS — I776 Arteritis, unspecified: Secondary | ICD-10-CM | POA: Diagnosis not present

## 2023-12-16 DIAGNOSIS — L97222 Non-pressure chronic ulcer of left calf with fat layer exposed: Secondary | ICD-10-CM | POA: Diagnosis not present

## 2023-12-16 DIAGNOSIS — L97821 Non-pressure chronic ulcer of other part of left lower leg limited to breakdown of skin: Secondary | ICD-10-CM | POA: Diagnosis not present

## 2023-12-16 DIAGNOSIS — E11622 Type 2 diabetes mellitus with other skin ulcer: Secondary | ICD-10-CM | POA: Diagnosis not present

## 2023-12-16 DIAGNOSIS — L97522 Non-pressure chronic ulcer of other part of left foot with fat layer exposed: Secondary | ICD-10-CM | POA: Diagnosis not present

## 2023-12-16 DIAGNOSIS — I872 Venous insufficiency (chronic) (peripheral): Secondary | ICD-10-CM | POA: Diagnosis not present

## 2023-12-16 DIAGNOSIS — E11621 Type 2 diabetes mellitus with foot ulcer: Secondary | ICD-10-CM | POA: Diagnosis not present

## 2023-12-16 DIAGNOSIS — Z7984 Long term (current) use of oral hypoglycemic drugs: Secondary | ICD-10-CM | POA: Diagnosis not present

## 2023-12-23 DIAGNOSIS — L97222 Non-pressure chronic ulcer of left calf with fat layer exposed: Secondary | ICD-10-CM | POA: Diagnosis not present

## 2023-12-23 DIAGNOSIS — I872 Venous insufficiency (chronic) (peripheral): Secondary | ICD-10-CM | POA: Diagnosis not present

## 2023-12-23 DIAGNOSIS — I776 Arteritis, unspecified: Secondary | ICD-10-CM | POA: Diagnosis not present

## 2023-12-23 DIAGNOSIS — E11622 Type 2 diabetes mellitus with other skin ulcer: Secondary | ICD-10-CM | POA: Diagnosis not present

## 2023-12-23 DIAGNOSIS — Z7984 Long term (current) use of oral hypoglycemic drugs: Secondary | ICD-10-CM | POA: Diagnosis not present

## 2023-12-23 DIAGNOSIS — L97821 Non-pressure chronic ulcer of other part of left lower leg limited to breakdown of skin: Secondary | ICD-10-CM | POA: Diagnosis not present

## 2023-12-24 ENCOUNTER — Other Ambulatory Visit (HOSPITAL_BASED_OUTPATIENT_CLINIC_OR_DEPARTMENT_OTHER): Payer: Self-pay | Admitting: Family Medicine

## 2023-12-24 DIAGNOSIS — Z961 Presence of intraocular lens: Secondary | ICD-10-CM | POA: Diagnosis not present

## 2023-12-24 DIAGNOSIS — Z9841 Cataract extraction status, right eye: Secondary | ICD-10-CM | POA: Diagnosis not present

## 2023-12-24 DIAGNOSIS — Z9842 Cataract extraction status, left eye: Secondary | ICD-10-CM | POA: Diagnosis not present

## 2023-12-24 DIAGNOSIS — E119 Type 2 diabetes mellitus without complications: Secondary | ICD-10-CM | POA: Diagnosis not present

## 2023-12-31 DIAGNOSIS — I872 Venous insufficiency (chronic) (peripheral): Secondary | ICD-10-CM | POA: Diagnosis not present

## 2023-12-31 DIAGNOSIS — Z7984 Long term (current) use of oral hypoglycemic drugs: Secondary | ICD-10-CM | POA: Diagnosis not present

## 2023-12-31 DIAGNOSIS — I776 Arteritis, unspecified: Secondary | ICD-10-CM | POA: Diagnosis not present

## 2023-12-31 DIAGNOSIS — L97222 Non-pressure chronic ulcer of left calf with fat layer exposed: Secondary | ICD-10-CM | POA: Diagnosis not present

## 2023-12-31 DIAGNOSIS — E11622 Type 2 diabetes mellitus with other skin ulcer: Secondary | ICD-10-CM | POA: Diagnosis not present

## 2023-12-31 DIAGNOSIS — L97821 Non-pressure chronic ulcer of other part of left lower leg limited to breakdown of skin: Secondary | ICD-10-CM | POA: Diagnosis not present

## 2023-12-31 NOTE — Progress Notes (Unsigned)
 [address] [place], [place]  [address] Phone: [phone] Fax:  [phone]  Date:  01/04/2024   ID:  Waverly, Chavarria Dec 17, 1945, MRN [id number]  PCP:  [place], Raymond J, MD  Cardiologist:  Dr. Korver Graybeal [place]     History of Present Illness: [name] is a 78 y.o. male seen for follow up Afib. He has a  hx of permanent AFib, mod MR, chronic pulmonary HTN, HTN, HL, T2DM.  Echo (09/2011):  EF 55%, mod MR, severe LAE, severe RAE, PASP 57-61, trivial eff. Apparently had Afib dating back to the 1970s while in the service in [place] .   He underwent lap cholecystectomy in 2016. In 2018 he was admitted with hyperkalemia and AKI that responded to hydration.   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 He was treated with IV antibiotics. He had been given vitamin K  and advised to hold his Coumadin  with plans to start Eliquis  once INR trended down to 2.  He had been discharged home on Keflex.   He was readmitted to our hospital on 12/18-12/25/23 with syncope and upper GI bleed. He had black stools.  He was orthostatic and had AKI, hyperkalemia.GI team was consulted to assist with patient's management. Patient underwent EGD on 05/04/2022. EGD revealed polyps of the stomach and a nonbleeding duodenal ulcer. He was transfused 4 units of PRBCs. GI planned relook in 6-8 weeks. He was hydrated with return of renal function to baseline. He was DC on Eliquis . Echo during his stay (study done with high HR in AFib) showed normal EF. Moderate RV enlargement, moderate pulmonary HTN, severe MR and severe biatrial enlargement.  Patient's anticoagulation was restarted. He was on midodrine  and Florinef  for orthostasis.   On follow up with PCP on March 4 Hgb improved to 9.1 and renal function was back to baseline. He is still weak. Getting home PT.   Still getting wraps on LE at wound center. Notes he has lost 40 lbs since beginning of year with poor appetite and medical illnesses.  On follow up today he is doing well. Notes left leg wound has almost cleared up completely. Followed at wound care center. Rarely taking lasix  now. No palpitations or dyspnea.    Recent Labs: 09/11/2023: ALT 9; Creatinine, Ser 1.79; Hemoglobin 11.2; Potassium 3.6  Wt Readings from Last 3 Encounters:  01/04/24 211 lb 1.6 oz (95.8 kg)  09/11/23 210 lb (95.3 kg)  08/26/23 210 lb 9.6 oz (95.5 kg)     Past Medical History:  Diagnosis Date   Atrial fibrillation (HCC)    Chronic anticoagulation    Complication of anesthesia    ' hard time waking up per patient    Diabetes mellitus    type 2   GERD (gastroesophageal reflux disease)    Hyperlipidemia    Hypertension    Mitral regurgitation    a. 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HCC)    chronic atrial fib   Pulmonary hypertension (HCC)    Pulmonary hypertension (HCC)     Current Outpatient Medications  Medication Sig Dispense Refill   acetaminophen  (TYLENOL ) 500 MG tablet Take 1,000 mg by mouth as needed for mild pain.     allopurinol  (ZYLOPRIM ) 100 MG tablet TAKE 1 TABLET BY MOUTH 2 TIMES  A DAY 120 tablet 3   apixaban  (ELIQUIS ) 5 MG TABS tablet Take 1 tablet (5 mg total) by mouth 2 (two) times daily. 60 tablet 0   feeding supplement (ENSURE ENLIVE / ENSURE PLUS) LIQD Take 237 mLs by mouth 2 (two) times daily between meals. 237 mL 12   furosemide  (LASIX ) 20 MG tablet TAKE 1 TABLET BY MOUTH DAILY AS NEEDED FOR EDEMA 30 tablet 1   glipiZIDE  (GLUCOTROL  XL) 2.5 MG 24 hr tablet TAKE 1 TABLET BY MOUTH DAILY WITH BREAKFAST 90 tablet 1   Glucose Blood (FREESTYLE LITE TEST VI)      Iron , Ferrous Sulfate , 325 (65 Fe) MG TABS Take 325 mg by mouth daily. 30 tablet 2   metoprolol  tartrate (LOPRESSOR ) 25 MG tablet TAKE 1/2 TABLET BY  MOUTH TWICE A DAY 90 tablet 3   mycophenolate (CELLCEPT) 500 MG tablet Take 1 tablet by mouth every morning.     NASADROPS SALINE ON THE GO 0.9 % SOLN Apply topically.     pantoprazole  (PROTONIX ) 40 MG tablet Take 1 tablet (40 mg total) by mouth daily. Please schedule a yearly follow up for any additional refills. Thank you 90 tablet 0   silver  sulfADIAZINE  (SILVADENE ) 1 % cream Apply 1 Application topically daily. 85 g 0   simvastatin  (ZOCOR ) 20 MG tablet TAKE 1 TABLET BY MOUTH AT BEDTIME 90 tablet 1   tadalafil  (CIALIS ) 5 MG tablet TAKE 1 TABLET BY MOUTH DAILY 30 tablet 1   tamsulosin  (FLOMAX ) 0.4 MG CAPS capsule TAKE 1 CAPSULE BY MOUTH DAILY 30 capsule 2   Wound Cleansers (VASHE CLEANSING) SOLN Apply topically.     No current facility-administered medications for this visit.    Allergies:   Atorvastatin, Lisinopril, and Mycophenolate mofetil   Social History:  The patient  reports that he has never smoked. He has never been exposed to tobacco smoke. He has never used smokeless tobacco. He reports that he does not currently use alcohol. He reports that he does not use drugs.   Family History:  The patient's family history includes Alzheimer's disease in his mother; Diabetes in his brother and mother; Heart disease in his father; Hyperlipidemia in his brother; Hypertension in his mother.   ROS:  Please see the history of present illness.      All other systems reviewed and negative.   PHYSICAL EXAM: VS:  BP (!) 128/58   Pulse 65   Ht 6' 3 (1.905 m)   Wt 211 lb 1.6 oz (95.8 kg)   SpO2 97%   BMI 26.39 kg/m  GENERAL:  Well appearing WM in NAD HEENT:  PERRL, EOMI, sclera are clear. Oropharynx is clear. NECK:  No jugular venous distention, carotid upstroke brisk and symmetric, no bruits, no thyromegaly or adenopathy Back is kyphotic.  HEART:  IRRR,  PMI not displaced or sustained,S1 and S2 within normal limits, no S3, no S4: no clicks, no rubs, 2/6 systolic systolic murmur at the  apex ABD:  Soft, nontender. BS +, no masses or bruits. No hepatomegaly, no splenomegaly EXT:  eft leg wrapped.  Right leg with compression hose. SKIN:  Warm and dry.  No rashes NEURO:  Alert and oriented x 3. Cranial nerves II through XII intact. PSYCH:  Cognitively intact    Laboratory data: Lab Results  Component Value Date   WBC 6.5 09/11/2023   HGB 11.2 (L) 09/11/2023   HCT 34.5 (L) 09/11/2023   PLT 113 (L) 09/11/2023   GLUCOSE 90 09/11/2023  ALT 9 09/11/2023   AST 20 09/11/2023   NA 139 09/11/2023   K 3.6 09/11/2023   CL 106 09/11/2023   CREATININE 1.79 (H) 09/11/2023   BUN 30 (H) 09/11/2023   CO2 20 (L) 09/11/2023   TSH 1.990 08/24/2022   INR 1.4 (H) 05/05/2022   HGBA1C 5.5 08/26/2023   HGBA1C 5.5 08/26/2023   HGBA1C 5.5 08/26/2023   MICROALBUR 150 01/23/2021   Dated 10/19/17: cholesterol 145, triglycerides 125, HDL 38, LDL 82. A1c 7.5%. Hgb 13.1. creatinine 1.71. other chemistries and TSH normal.  Dated 08/03/22: glucose 118, BUN 26, creatinine 1.57. otherwise chemistries and magnesium normal. Hgb 9,3,  Dated 12/25/22: BUN 35, creatinine 1.47. CRP 72. Electrolytes normal. Hgb 10.1  EKG Interpretation Date/Time:  Tuesday January 04 2024 15:31:22 EDT Ventricular Rate:  65 PR Interval:    QRS Duration:  84 QT Interval:  370 QTC Calculation: 384 R Axis:   0  Text Interpretation: Atrial fibrillation When compared with ECG of 03-May-2022 07:17, PVCs resolved. Confirmed by [place], Lucyann Romano [id number]) on 01/04/2024 3:58:53 PM    Echo 12/24/14: Study Conclusions   - Left ventricle: The cavity size was mildly dilated. Wall   thickness was normal. Systolic function was normal. The estimated   ejection fraction was in the range of 60% to 65%. - Mitral valve: There was moderate regurgitation. - Left atrium: The atrium was severely dilated. - Right ventricle: The cavity size was moderately dilated. Wall   thickness was normal. - Right atrium: The atrium was moderately  dilated  Echo 05/31/20: IMPRESSIONS     1. Left ventricular ejection fraction, by estimation, is 60 to 65%. The  left ventricle has normal function. The left ventricle has no regional  wall motion abnormalities. There is mild left ventricular hypertrophy.  Left ventricular diastolic function  could not be evaluated.   2. Right ventricular systolic function is normal. The right ventricular  size is normal. There is severely elevated pulmonary artery systolic  pressure. The estimated right ventricular systolic pressure is 66.0 mmHg.   3. Left atrial size was severely dilated.   4. Right atrial size was moderately dilated.   5. The mitral valve is abnormal. Moderate to severe mitral valve  regurgitation.   6. Eccentric TR jet. The tricuspid valve is abnormal. Tricuspid valve  regurgitation is moderate.   7. The aortic valve is tricuspid. Aortic valve regurgitation is trivial.   8. The inferior vena cava is dilated in size with <50% respiratory  variability, suggesting right atrial pressure of 15 mmHg.   Comparison(s): Changes from prior study are noted. 05/31/19 EF 60-65%.  Moderate-severe MR, RVSP 50 mmHg.   Echo 05/05/22: IMPRESSIONS     1. Left ventricular ejection fraction, by estimation, is 70 to 75%. The  left ventricle has hyperdynamic function. The left ventricle has no  regional wall motion abnormalities. Left ventricular diastolic parameters  are indeterminate.   2. Right ventricular systolic function is normal. The right ventricular  size is moderately enlarged. There is moderately elevated pulmonary artery  systolic pressure. The estimated right ventricular systolic pressure is  57.3 mmHg.   3. Left atrial size was massively dilated.   4. Right atrial size was severely dilated.   5. The mitral valve is normal in structure. Severe mitral valve  regurgitation.   6. Tricuspid valve regurgitation is severe.   7. The aortic valve is tricuspid. Aortic valve regurgitation is  trivial.   8. Pulmonic valve regurgitation is moderate.   9.  The inferior vena cava is normal in size with <50% respiratory  variability, suggesting right atrial pressure of 8 mmHg.   Comparison(s): LV is more vigorous from prior, imaged at 140 bpm + during  this study.   ASSESSMENT AND PLAN:  Atrial Fibrillation- chronic and permanent:  Rate is well controlled on lower dose of metoprolol  now.  Now on Eliquis  since Coumadin  difficult to regulate.  Mitral Regurgitation: moderate- severe. 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 He has had AFib since 1970s. His LV function is good. He is not really symptomatic. The pulmonary HTN noted on Echo has been there at least 20 years. Will continue current therapy. Currently on lasix  PRN Orthostatic Hypotension:  now resolved.  Hyperlipidemia:  Continue statin- Zocor . Reports lab done at [facility]  Anemia due to acute blood loss. Continue Fe supplement 325 mg daily. Last Hgb 11.2 CKD stage 3b. Followed by Nephrology. Stable. Duodenal ulcer. Per GI. Resolved.  8.   Leg wound/cellulits. S/p toe amputation. Improved.  I will follow up in one year   Signed, Yu Peggs [place] MD, FACC    01/04/2024 3:59 PM

## 2024-01-04 ENCOUNTER — Ambulatory Visit: Attending: Cardiology | Admitting: Cardiology

## 2024-01-04 ENCOUNTER — Encounter: Payer: Self-pay | Admitting: Cardiology

## 2024-01-04 VITALS — BP 128/58 | HR 65 | Ht 75.0 in | Wt 211.1 lb

## 2024-01-04 DIAGNOSIS — I4821 Permanent atrial fibrillation: Secondary | ICD-10-CM | POA: Insufficient documentation

## 2024-01-04 DIAGNOSIS — Z01818 Encounter for other preprocedural examination: Secondary | ICD-10-CM | POA: Insufficient documentation

## 2024-01-04 DIAGNOSIS — N1832 Chronic kidney disease, stage 3b: Secondary | ICD-10-CM | POA: Diagnosis not present

## 2024-01-04 DIAGNOSIS — I340 Nonrheumatic mitral (valve) insufficiency: Secondary | ICD-10-CM | POA: Insufficient documentation

## 2024-01-04 NOTE — Patient Instructions (Addendum)
 Medication Instructions:  Continue same medications *If you need a refill on your cardiac medications before your next appointment, please call your pharmacy*  Lab Work: None ordered  Testing/Procedures: None ordered  Follow-Up: At [facility], you and your health needs are our priority.  As part of our continuing mission to provide you with exceptional heart care, our providers are all part of one team.  This team includes your primary Cardiologist (physician) and Advanced Practice Providers or APPs (Physician Assistants and Nurse Practitioners) who all work together to provide you with the care you need, when you need it.  Your next appointment:  1 year   Call in May to schedule Oct appointment     Provider:  Dr.Jordan   We recommend signing up for the patient portal called MyChart.  Sign up information is provided on this After Visit Summary.  MyChart is used to connect with patients for Virtual Visits (Telemedicine).  Patients are able to view lab/test results, encounter notes, upcoming appointments, etc.  Non-urgent messages can be sent to your provider as well.   To learn more about what you can do with MyChart, go to [email or web address].

## 2024-01-07 DIAGNOSIS — I776 Arteritis, unspecified: Secondary | ICD-10-CM | POA: Diagnosis not present

## 2024-01-07 DIAGNOSIS — L97821 Non-pressure chronic ulcer of other part of left lower leg limited to breakdown of skin: Secondary | ICD-10-CM | POA: Diagnosis not present

## 2024-01-07 DIAGNOSIS — I872 Venous insufficiency (chronic) (peripheral): Secondary | ICD-10-CM | POA: Diagnosis not present

## 2024-01-07 DIAGNOSIS — Z7984 Long term (current) use of oral hypoglycemic drugs: Secondary | ICD-10-CM | POA: Diagnosis not present

## 2024-01-07 DIAGNOSIS — L97222 Non-pressure chronic ulcer of left calf with fat layer exposed: Secondary | ICD-10-CM | POA: Diagnosis not present

## 2024-01-07 DIAGNOSIS — E11622 Type 2 diabetes mellitus with other skin ulcer: Secondary | ICD-10-CM | POA: Diagnosis not present

## 2024-01-14 ENCOUNTER — Other Ambulatory Visit (HOSPITAL_BASED_OUTPATIENT_CLINIC_OR_DEPARTMENT_OTHER): Payer: Self-pay | Admitting: Family Medicine

## 2024-01-16 ENCOUNTER — Other Ambulatory Visit (HOSPITAL_BASED_OUTPATIENT_CLINIC_OR_DEPARTMENT_OTHER): Payer: Self-pay | Admitting: Family Medicine

## 2024-01-18 DIAGNOSIS — Z7984 Long term (current) use of oral hypoglycemic drugs: Secondary | ICD-10-CM | POA: Diagnosis not present

## 2024-01-18 DIAGNOSIS — I872 Venous insufficiency (chronic) (peripheral): Secondary | ICD-10-CM | POA: Diagnosis not present

## 2024-01-18 DIAGNOSIS — L97822 Non-pressure chronic ulcer of other part of left lower leg with fat layer exposed: Secondary | ICD-10-CM | POA: Diagnosis not present

## 2024-01-18 DIAGNOSIS — I776 Arteritis, unspecified: Secondary | ICD-10-CM | POA: Diagnosis not present

## 2024-01-18 DIAGNOSIS — E11622 Type 2 diabetes mellitus with other skin ulcer: Secondary | ICD-10-CM | POA: Diagnosis not present

## 2024-01-18 DIAGNOSIS — L97522 Non-pressure chronic ulcer of other part of left foot with fat layer exposed: Secondary | ICD-10-CM | POA: Diagnosis not present

## 2024-01-25 ENCOUNTER — Telehealth (HOSPITAL_BASED_OUTPATIENT_CLINIC_OR_DEPARTMENT_OTHER): Payer: Self-pay

## 2024-01-25 DIAGNOSIS — L97822 Non-pressure chronic ulcer of other part of left lower leg with fat layer exposed: Secondary | ICD-10-CM | POA: Diagnosis not present

## 2024-01-25 DIAGNOSIS — E11622 Type 2 diabetes mellitus with other skin ulcer: Secondary | ICD-10-CM | POA: Diagnosis not present

## 2024-01-25 DIAGNOSIS — I872 Venous insufficiency (chronic) (peripheral): Secondary | ICD-10-CM | POA: Diagnosis not present

## 2024-01-25 DIAGNOSIS — I776 Arteritis, unspecified: Secondary | ICD-10-CM | POA: Diagnosis not present

## 2024-01-25 DIAGNOSIS — Z7984 Long term (current) use of oral hypoglycemic drugs: Secondary | ICD-10-CM | POA: Diagnosis not present

## 2024-01-25 NOTE — Telephone Encounter (Signed)
   Pre-operative Risk Assessment    Patient Name: [name]  DOB: 28-Apr-1945 MRN: [id number]   Date of last office visit: 01/04/24 with Dr. Jordan Date of next office visit: NA   Request for Surgical Clearance    Procedure:  Lumbar Epidural   Date of Surgery:  Clearance TBD                                 Surgeon:  Dr. Deatrice Manus Surgeon's Group or Practice Name:  [facility] Phone number:  [phone] Fax number:  [phone]   Type of Clearance Requested:   - Medical  - Pharmacy:  Hold Apixaban  (Eliquis ) 3 days prior   Type of Anesthesia:  Not Indicated   Additional requests/questions:    [name]   01/25/2024, 1:41 PM

## 2024-01-31 NOTE — Telephone Encounter (Signed)
 Patient with diagnosis of atrial fibrillation on Eliquis  for anticoagulation.    Procedure:  Lumbar Epidural    Date of Surgery:  Clearance TBD     CHA2DS2-VASc Score = 4   This indicates a 4.8% annual risk of stroke. The patient's score is based upon: CHF History: 0 HTN History: 1 Diabetes History: 1 Stroke History: 0 Vascular Disease History: 0 Age Score: 2 Gender Score: 0      CrCl 47 Platelet count 113  Patient has not had an Afib/aflutter ablation in the last 3 months, DCCV within the last 4 weeks or a watchman implanted in the last 45 days    Per office protocol, patient can hold Eliquis  for 3 days prior to procedure.   Patient will not need bridging with Lovenox (enoxaparin) around procedure.  **This guidance is not considered finalized until pre-operative APP has relayed final recommendations.**

## 2024-02-01 DIAGNOSIS — M5412 Radiculopathy, cervical region: Secondary | ICD-10-CM | POA: Diagnosis not present

## 2024-02-01 NOTE — Telephone Encounter (Signed)
     Primary Cardiologist: Peter Jordan, MD  Chart reviewed as part of pre-operative protocol coverage. Given past medical history and time since last visit, based on ACC/AHA guidelines, [name] would be at acceptable risk for the planned procedure without further cardiovascular testing.   Patient has not had an Afib/aflutter ablation in the last 3 months, DCCV within the last 4 weeks or a watchman implanted in the last 45 days    Per office protocol, patient can hold Eliquis  for 3 days prior to procedure.   Patient will not need bridging with Lovenox (enoxaparin) around procedure.   I will route this recommendation to the requesting party via Epic fax function and remove from pre-op pool.  Please call with questions.  [name]. Josemanuel Eakins NP-C     02/01/2024, 6:58 AM [facility] [address] [place], [place] [address] Office [phone]

## 2024-02-02 DIAGNOSIS — Z7984 Long term (current) use of oral hypoglycemic drugs: Secondary | ICD-10-CM | POA: Diagnosis not present

## 2024-02-02 DIAGNOSIS — E11622 Type 2 diabetes mellitus with other skin ulcer: Secondary | ICD-10-CM | POA: Diagnosis not present

## 2024-02-02 DIAGNOSIS — I776 Arteritis, unspecified: Secondary | ICD-10-CM | POA: Diagnosis not present

## 2024-02-02 DIAGNOSIS — I872 Venous insufficiency (chronic) (peripheral): Secondary | ICD-10-CM | POA: Diagnosis not present

## 2024-02-02 DIAGNOSIS — L97822 Non-pressure chronic ulcer of other part of left lower leg with fat layer exposed: Secondary | ICD-10-CM | POA: Diagnosis not present

## 2024-02-16 ENCOUNTER — Other Ambulatory Visit (HOSPITAL_BASED_OUTPATIENT_CLINIC_OR_DEPARTMENT_OTHER): Payer: Self-pay | Admitting: Family Medicine

## 2024-03-20 DIAGNOSIS — E785 Hyperlipidemia, unspecified: Secondary | ICD-10-CM

## 2024-04-09 ENCOUNTER — Other Ambulatory Visit (HOSPITAL_BASED_OUTPATIENT_CLINIC_OR_DEPARTMENT_OTHER): Payer: Self-pay | Admitting: Family Medicine

## 2024-04-10 ENCOUNTER — Other Ambulatory Visit (HOSPITAL_BASED_OUTPATIENT_CLINIC_OR_DEPARTMENT_OTHER): Payer: Self-pay | Admitting: Family Medicine

## 2024-04-16 ENCOUNTER — Other Ambulatory Visit (HOSPITAL_BASED_OUTPATIENT_CLINIC_OR_DEPARTMENT_OTHER): Payer: Self-pay | Admitting: Family Medicine
